# Patient Record
Sex: Female | Born: 1955 | Race: White | Hispanic: No | Marital: Married | Smoking: Current some day smoker
Health system: Southern US, Community
[De-identification: ages and names within clinical notes are randomized; demographics above are authoritative.]

## PROBLEM LIST (undated history)

## (undated) DIAGNOSIS — J45909 Unspecified asthma, uncomplicated: Secondary | ICD-10-CM

## (undated) DIAGNOSIS — I739 Peripheral vascular disease, unspecified: Secondary | ICD-10-CM

## (undated) DIAGNOSIS — G43909 Migraine, unspecified, not intractable, without status migrainosus: Secondary | ICD-10-CM

## (undated) DIAGNOSIS — E785 Hyperlipidemia, unspecified: Secondary | ICD-10-CM

## (undated) DIAGNOSIS — I10 Essential (primary) hypertension: Secondary | ICD-10-CM

## (undated) DIAGNOSIS — I2510 Atherosclerotic heart disease of native coronary artery without angina pectoris: Secondary | ICD-10-CM

## (undated) DIAGNOSIS — K589 Irritable bowel syndrome without diarrhea: Secondary | ICD-10-CM

## (undated) DIAGNOSIS — J449 Chronic obstructive pulmonary disease, unspecified: Secondary | ICD-10-CM

---

## 2000-11-23 ENCOUNTER — Encounter: Payer: Self-pay | Admitting: Surgery

## 2000-11-23 ENCOUNTER — Ambulatory Visit (HOSPITAL_COMMUNITY): Admission: RE | Admit: 2000-11-23 | Discharge: 2000-11-23 | Payer: Self-pay | Admitting: *Deleted

## 2002-10-09 ENCOUNTER — Encounter: Payer: Self-pay | Admitting: Specialist

## 2002-10-09 ENCOUNTER — Ambulatory Visit (HOSPITAL_COMMUNITY): Admission: RE | Admit: 2002-10-09 | Discharge: 2002-10-09 | Payer: Self-pay | Admitting: Specialist

## 2004-06-18 ENCOUNTER — Encounter: Admission: RE | Admit: 2004-06-18 | Discharge: 2004-06-18 | Payer: Self-pay | Admitting: Family Medicine

## 2004-06-23 ENCOUNTER — Encounter: Admission: RE | Admit: 2004-06-23 | Discharge: 2004-06-23 | Payer: Self-pay | Admitting: Family Medicine

## 2005-02-07 ENCOUNTER — Emergency Department (HOSPITAL_COMMUNITY): Admission: EM | Admit: 2005-02-07 | Discharge: 2005-02-08 | Payer: Self-pay | Admitting: Emergency Medicine

## 2005-02-11 ENCOUNTER — Ambulatory Visit: Payer: Self-pay | Admitting: Family Medicine

## 2005-02-16 ENCOUNTER — Encounter: Admission: RE | Admit: 2005-02-16 | Discharge: 2005-02-16 | Payer: Self-pay | Admitting: General Surgery

## 2005-02-17 ENCOUNTER — Ambulatory Visit: Payer: Self-pay | Admitting: Family Medicine

## 2005-02-26 ENCOUNTER — Encounter: Admission: RE | Admit: 2005-02-26 | Discharge: 2005-02-26 | Payer: Self-pay | Admitting: General Surgery

## 2005-03-11 ENCOUNTER — Ambulatory Visit: Payer: Self-pay | Admitting: Family Medicine

## 2005-03-17 ENCOUNTER — Ambulatory Visit: Payer: Self-pay | Admitting: Internal Medicine

## 2005-04-05 ENCOUNTER — Ambulatory Visit: Payer: Self-pay | Admitting: Family Medicine

## 2005-04-19 ENCOUNTER — Ambulatory Visit: Payer: Self-pay | Admitting: Family Medicine

## 2005-06-15 ENCOUNTER — Ambulatory Visit: Payer: Self-pay | Admitting: Family Medicine

## 2005-07-14 ENCOUNTER — Ambulatory Visit: Payer: Self-pay | Admitting: Family Medicine

## 2005-08-17 ENCOUNTER — Ambulatory Visit: Payer: Self-pay | Admitting: Family Medicine

## 2005-08-30 ENCOUNTER — Ambulatory Visit: Payer: Self-pay | Admitting: Family Medicine

## 2005-09-14 ENCOUNTER — Ambulatory Visit: Payer: Self-pay | Admitting: Family Medicine

## 2005-12-14 ENCOUNTER — Ambulatory Visit: Payer: Self-pay | Admitting: Gastroenterology

## 2005-12-14 ENCOUNTER — Inpatient Hospital Stay (HOSPITAL_COMMUNITY): Admission: EM | Admit: 2005-12-14 | Discharge: 2005-12-16 | Payer: Self-pay | Admitting: Family Medicine

## 2005-12-14 ENCOUNTER — Ambulatory Visit: Payer: Self-pay | Admitting: Family Medicine

## 2005-12-15 ENCOUNTER — Ambulatory Visit: Payer: Self-pay | Admitting: Internal Medicine

## 2005-12-21 ENCOUNTER — Ambulatory Visit: Payer: Self-pay | Admitting: Family Medicine

## 2005-12-24 ENCOUNTER — Ambulatory Visit: Payer: Self-pay | Admitting: Internal Medicine

## 2005-12-30 ENCOUNTER — Ambulatory Visit: Payer: Self-pay | Admitting: Family Medicine

## 2005-12-31 ENCOUNTER — Ambulatory Visit (HOSPITAL_COMMUNITY): Admission: RE | Admit: 2005-12-31 | Discharge: 2005-12-31 | Payer: Self-pay | Admitting: Internal Medicine

## 2006-01-05 ENCOUNTER — Ambulatory Visit: Payer: Self-pay | Admitting: Internal Medicine

## 2006-02-15 ENCOUNTER — Ambulatory Visit: Payer: Self-pay | Admitting: Family Medicine

## 2006-02-24 ENCOUNTER — Ambulatory Visit: Payer: Self-pay | Admitting: Internal Medicine

## 2006-03-31 ENCOUNTER — Ambulatory Visit: Payer: Self-pay | Admitting: Internal Medicine

## 2006-04-19 ENCOUNTER — Ambulatory Visit: Payer: Self-pay | Admitting: Family Medicine

## 2006-05-19 ENCOUNTER — Ambulatory Visit: Payer: Self-pay | Admitting: Family Medicine

## 2006-06-02 ENCOUNTER — Ambulatory Visit (HOSPITAL_COMMUNITY): Admission: RE | Admit: 2006-06-02 | Discharge: 2006-06-02 | Payer: Self-pay | Admitting: Internal Medicine

## 2006-06-02 ENCOUNTER — Encounter: Payer: Self-pay | Admitting: Internal Medicine

## 2006-06-02 ENCOUNTER — Observation Stay (HOSPITAL_COMMUNITY): Admission: EM | Admit: 2006-06-02 | Discharge: 2006-06-03 | Payer: Self-pay | Admitting: Emergency Medicine

## 2006-06-02 ENCOUNTER — Encounter (INDEPENDENT_AMBULATORY_CARE_PROVIDER_SITE_OTHER): Payer: Self-pay | Admitting: Specialist

## 2006-06-03 ENCOUNTER — Ambulatory Visit: Payer: Self-pay | Admitting: Family Medicine

## 2006-06-06 ENCOUNTER — Ambulatory Visit: Payer: Self-pay | Admitting: Family Medicine

## 2006-06-06 ENCOUNTER — Ambulatory Visit: Payer: Self-pay | Admitting: Internal Medicine

## 2006-06-14 ENCOUNTER — Ambulatory Visit: Payer: Self-pay | Admitting: Family Medicine

## 2006-06-27 ENCOUNTER — Ambulatory Visit: Payer: Self-pay | Admitting: Internal Medicine

## 2006-06-28 ENCOUNTER — Ambulatory Visit: Payer: Self-pay | Admitting: Family Medicine

## 2006-07-29 ENCOUNTER — Ambulatory Visit: Payer: Self-pay | Admitting: Family Medicine

## 2006-09-20 ENCOUNTER — Ambulatory Visit: Payer: Self-pay | Admitting: Family Medicine

## 2006-10-05 ENCOUNTER — Ambulatory Visit: Payer: Self-pay | Admitting: Internal Medicine

## 2007-02-07 ENCOUNTER — Ambulatory Visit: Payer: Self-pay | Admitting: Family Medicine

## 2007-02-17 ENCOUNTER — Inpatient Hospital Stay (HOSPITAL_COMMUNITY): Admission: RE | Admit: 2007-02-17 | Discharge: 2007-02-17 | Payer: Self-pay | Admitting: Orthopedic Surgery

## 2007-03-22 DIAGNOSIS — J45909 Unspecified asthma, uncomplicated: Secondary | ICD-10-CM | POA: Insufficient documentation

## 2007-03-22 DIAGNOSIS — E785 Hyperlipidemia, unspecified: Secondary | ICD-10-CM | POA: Insufficient documentation

## 2007-03-22 DIAGNOSIS — I739 Peripheral vascular disease, unspecified: Secondary | ICD-10-CM

## 2007-03-22 DIAGNOSIS — K5730 Diverticulosis of large intestine without perforation or abscess without bleeding: Secondary | ICD-10-CM | POA: Insufficient documentation

## 2007-05-05 ENCOUNTER — Ambulatory Visit: Payer: Self-pay | Admitting: Family Medicine

## 2007-05-25 ENCOUNTER — Ambulatory Visit: Payer: Self-pay | Admitting: Family Medicine

## 2007-05-25 DIAGNOSIS — I119 Hypertensive heart disease without heart failure: Secondary | ICD-10-CM | POA: Insufficient documentation

## 2007-07-17 ENCOUNTER — Ambulatory Visit: Payer: Self-pay | Admitting: Family Medicine

## 2007-07-17 DIAGNOSIS — G43909 Migraine, unspecified, not intractable, without status migrainosus: Secondary | ICD-10-CM

## 2007-07-17 DIAGNOSIS — F17200 Nicotine dependence, unspecified, uncomplicated: Secondary | ICD-10-CM

## 2007-07-25 ENCOUNTER — Telehealth: Payer: Self-pay | Admitting: Family Medicine

## 2007-07-26 ENCOUNTER — Telehealth: Payer: Self-pay | Admitting: Family Medicine

## 2007-08-02 ENCOUNTER — Ambulatory Visit: Payer: Self-pay | Admitting: Internal Medicine

## 2007-08-08 ENCOUNTER — Encounter: Admission: RE | Admit: 2007-08-08 | Discharge: 2007-08-08 | Payer: Self-pay | Admitting: Family Medicine

## 2007-08-25 ENCOUNTER — Ambulatory Visit: Payer: Self-pay | Admitting: Family Medicine

## 2007-08-25 LAB — CONVERTED CEMR LAB
ALT: 17 units/L (ref 0–35)
AST: 22 units/L (ref 0–37)
Albumin: 4.2 g/dL (ref 3.5–5.2)
Alkaline Phosphatase: 81 units/L (ref 39–117)
BUN: 4 mg/dL — ABNORMAL LOW (ref 6–23)
Basophils Absolute: 0.1 10*3/uL (ref 0.0–0.1)
Calcium: 10 mg/dL (ref 8.4–10.5)
Chloride: 106 meq/L (ref 96–112)
Creatinine, Ser: 0.8 mg/dL (ref 0.4–1.2)
HCT: 42.5 % (ref 36.0–46.0)
MCHC: 34.7 g/dL (ref 30.0–36.0)
Neutrophils Relative %: 68.9 % (ref 43.0–77.0)
Platelets: 366 10*3/uL (ref 150–400)
RBC: 4.57 M/uL (ref 3.87–5.11)
RDW: 12.3 % (ref 11.5–14.6)
Sodium: 147 meq/L — ABNORMAL HIGH (ref 135–145)
Total Bilirubin: 0.4 mg/dL (ref 0.3–1.2)
Total CHOL/HDL Ratio: 8.5
Triglycerides: 250 mg/dL (ref 0–149)

## 2007-08-29 ENCOUNTER — Ambulatory Visit: Payer: Self-pay | Admitting: Family Medicine

## 2007-09-07 ENCOUNTER — Ambulatory Visit: Payer: Self-pay | Admitting: Cardiology

## 2007-09-29 ENCOUNTER — Telehealth: Payer: Self-pay | Admitting: Family Medicine

## 2007-09-29 ENCOUNTER — Ambulatory Visit: Payer: Self-pay | Admitting: Family Medicine

## 2007-09-29 DIAGNOSIS — K589 Irritable bowel syndrome without diarrhea: Secondary | ICD-10-CM

## 2007-10-30 ENCOUNTER — Ambulatory Visit: Payer: Self-pay | Admitting: Internal Medicine

## 2007-10-30 DIAGNOSIS — R1319 Other dysphagia: Secondary | ICD-10-CM

## 2007-10-31 ENCOUNTER — Encounter: Payer: Self-pay | Admitting: Internal Medicine

## 2007-11-14 ENCOUNTER — Encounter: Payer: Self-pay | Admitting: Family Medicine

## 2007-11-14 ENCOUNTER — Ambulatory Visit: Payer: Self-pay | Admitting: Internal Medicine

## 2007-11-14 ENCOUNTER — Encounter (INDEPENDENT_AMBULATORY_CARE_PROVIDER_SITE_OTHER): Payer: Self-pay | Admitting: *Deleted

## 2007-11-14 ENCOUNTER — Encounter: Payer: Self-pay | Admitting: Internal Medicine

## 2007-11-28 DIAGNOSIS — M5137 Other intervertebral disc degeneration, lumbosacral region: Secondary | ICD-10-CM | POA: Insufficient documentation

## 2007-12-11 ENCOUNTER — Ambulatory Visit: Payer: Self-pay | Admitting: Family Medicine

## 2007-12-14 ENCOUNTER — Ambulatory Visit: Payer: Self-pay | Admitting: Family Medicine

## 2007-12-17 ENCOUNTER — Encounter: Admission: RE | Admit: 2007-12-17 | Discharge: 2007-12-17 | Payer: Self-pay | Admitting: Family Medicine

## 2007-12-18 ENCOUNTER — Telehealth (INDEPENDENT_AMBULATORY_CARE_PROVIDER_SITE_OTHER): Payer: Self-pay | Admitting: *Deleted

## 2007-12-19 ENCOUNTER — Telehealth: Payer: Self-pay | Admitting: Family Medicine

## 2008-01-23 ENCOUNTER — Ambulatory Visit: Payer: Self-pay | Admitting: Family Medicine

## 2008-04-12 ENCOUNTER — Encounter: Payer: Self-pay | Admitting: *Deleted

## 2008-07-11 ENCOUNTER — Telehealth: Payer: Self-pay | Admitting: Family Medicine

## 2008-08-20 ENCOUNTER — Ambulatory Visit: Payer: Self-pay | Admitting: Family Medicine

## 2008-08-27 ENCOUNTER — Encounter (INDEPENDENT_AMBULATORY_CARE_PROVIDER_SITE_OTHER): Payer: Self-pay | Admitting: *Deleted

## 2008-08-28 ENCOUNTER — Ambulatory Visit: Payer: Self-pay | Admitting: Internal Medicine

## 2008-08-28 DIAGNOSIS — H1851 Endothelial corneal dystrophy: Secondary | ICD-10-CM

## 2008-08-28 DIAGNOSIS — F411 Generalized anxiety disorder: Secondary | ICD-10-CM | POA: Insufficient documentation

## 2008-09-03 ENCOUNTER — Ambulatory Visit: Payer: Self-pay | Admitting: Family Medicine

## 2008-09-10 ENCOUNTER — Telehealth: Payer: Self-pay | Admitting: Internal Medicine

## 2008-09-16 ENCOUNTER — Encounter: Payer: Self-pay | Admitting: Family Medicine

## 2008-09-16 ENCOUNTER — Ambulatory Visit: Payer: Self-pay | Admitting: Family Medicine

## 2008-09-18 ENCOUNTER — Telehealth: Payer: Self-pay | Admitting: Family Medicine

## 2008-09-18 DIAGNOSIS — I781 Nevus, non-neoplastic: Secondary | ICD-10-CM

## 2008-11-05 ENCOUNTER — Telehealth: Payer: Self-pay | Admitting: Internal Medicine

## 2008-12-06 ENCOUNTER — Telehealth: Payer: Self-pay | Admitting: Family Medicine

## 2008-12-09 ENCOUNTER — Telehealth: Payer: Self-pay | Admitting: Family Medicine

## 2008-12-25 ENCOUNTER — Telehealth: Payer: Self-pay | Admitting: Internal Medicine

## 2009-02-13 ENCOUNTER — Ambulatory Visit: Payer: Self-pay | Admitting: Family Medicine

## 2009-02-13 DIAGNOSIS — M549 Dorsalgia, unspecified: Secondary | ICD-10-CM | POA: Insufficient documentation

## 2009-02-13 DIAGNOSIS — L723 Sebaceous cyst: Secondary | ICD-10-CM

## 2009-02-21 ENCOUNTER — Telehealth: Payer: Self-pay | Admitting: Family Medicine

## 2009-03-10 ENCOUNTER — Telehealth: Payer: Self-pay | Admitting: Family Medicine

## 2009-03-11 ENCOUNTER — Telehealth: Payer: Self-pay | Admitting: Internal Medicine

## 2009-03-19 ENCOUNTER — Telehealth (INDEPENDENT_AMBULATORY_CARE_PROVIDER_SITE_OTHER): Payer: Self-pay | Admitting: *Deleted

## 2009-03-20 ENCOUNTER — Telehealth: Payer: Self-pay | Admitting: Internal Medicine

## 2009-03-21 ENCOUNTER — Telehealth: Payer: Self-pay | Admitting: Family Medicine

## 2009-03-26 ENCOUNTER — Telehealth: Payer: Self-pay | Admitting: Internal Medicine

## 2009-03-26 ENCOUNTER — Telehealth: Payer: Self-pay | Admitting: Family Medicine

## 2009-04-03 ENCOUNTER — Telehealth: Payer: Self-pay | Admitting: Family Medicine

## 2009-05-01 ENCOUNTER — Telehealth: Payer: Self-pay | Admitting: Family Medicine

## 2009-05-26 ENCOUNTER — Telehealth: Payer: Self-pay | Admitting: Internal Medicine

## 2009-05-28 ENCOUNTER — Ambulatory Visit: Payer: Self-pay | Admitting: Family Medicine

## 2009-05-28 DIAGNOSIS — G609 Hereditary and idiopathic neuropathy, unspecified: Secondary | ICD-10-CM | POA: Insufficient documentation

## 2009-05-28 DIAGNOSIS — S7000XA Contusion of unspecified hip, initial encounter: Secondary | ICD-10-CM

## 2009-05-29 ENCOUNTER — Telehealth: Payer: Self-pay | Admitting: Family Medicine

## 2009-05-29 ENCOUNTER — Ambulatory Visit: Payer: Self-pay | Admitting: Family Medicine

## 2009-06-02 ENCOUNTER — Telehealth: Payer: Self-pay | Admitting: Family Medicine

## 2009-06-02 LAB — CONVERTED CEMR LAB
ALT: 12 units/L (ref 0–35)
BUN: 11 mg/dL (ref 6–23)
Bilirubin, Direct: 0 mg/dL (ref 0.0–0.3)
CO2: 29 meq/L (ref 19–32)
Chloride: 104 meq/L (ref 96–112)
Creatinine, Ser: 0.7 mg/dL (ref 0.4–1.2)
Eosinophils Absolute: 0.1 10*3/uL (ref 0.0–0.7)
Glucose, Bld: 152 mg/dL — ABNORMAL HIGH (ref 70–99)
HCT: 39.1 % (ref 36.0–46.0)
Hgb A1c MFr Bld: 6 % (ref 4.6–6.5)
Iron: 30 ug/dL — ABNORMAL LOW (ref 42–145)
Lymphs Abs: 3.2 10*3/uL (ref 0.7–4.0)
MCHC: 35.4 g/dL (ref 30.0–36.0)
MCV: 93.1 fL (ref 78.0–100.0)
Monocytes Absolute: 0.7 10*3/uL (ref 0.1–1.0)
Neutrophils Relative %: 69.6 % (ref 43.0–77.0)
Platelets: 321 10*3/uL (ref 150.0–400.0)
Potassium: 3.8 meq/L (ref 3.5–5.1)
TSH: 0.81 microintl units/mL (ref 0.35–5.50)
Total Bilirubin: 0.5 mg/dL (ref 0.3–1.2)
Total Protein: 7 g/dL (ref 6.0–8.3)
Transferrin: 195.3 mg/dL — ABNORMAL LOW (ref 212.0–360.0)

## 2009-06-09 ENCOUNTER — Telehealth: Payer: Self-pay | Admitting: Family Medicine

## 2009-06-09 ENCOUNTER — Ambulatory Visit: Payer: Self-pay | Admitting: Family Medicine

## 2009-06-09 ENCOUNTER — Telehealth: Payer: Self-pay | Admitting: Internal Medicine

## 2009-06-09 DIAGNOSIS — R3129 Other microscopic hematuria: Secondary | ICD-10-CM | POA: Insufficient documentation

## 2009-06-09 DIAGNOSIS — M545 Low back pain: Secondary | ICD-10-CM

## 2009-06-09 LAB — CONVERTED CEMR LAB
Bilirubin Urine: NEGATIVE
Protein, U semiquant: NEGATIVE
Specific Gravity, Urine: <1.005
WBC Urine, dipstick: NEGATIVE

## 2009-06-10 ENCOUNTER — Encounter: Payer: Self-pay | Admitting: Family Medicine

## 2009-06-16 ENCOUNTER — Ambulatory Visit: Payer: Self-pay | Admitting: Internal Medicine

## 2009-06-16 DIAGNOSIS — A0472 Enterocolitis due to Clostridium difficile, not specified as recurrent: Secondary | ICD-10-CM

## 2009-06-30 ENCOUNTER — Telehealth: Payer: Self-pay | Admitting: Internal Medicine

## 2009-07-17 ENCOUNTER — Telehealth: Payer: Self-pay | Admitting: Family Medicine

## 2009-09-02 ENCOUNTER — Ambulatory Visit: Payer: Self-pay | Admitting: Family Medicine

## 2009-09-02 ENCOUNTER — Telehealth: Payer: Self-pay | Admitting: Internal Medicine

## 2009-09-03 ENCOUNTER — Ambulatory Visit: Payer: Self-pay | Admitting: Internal Medicine

## 2009-09-03 DIAGNOSIS — I10 Essential (primary) hypertension: Secondary | ICD-10-CM | POA: Insufficient documentation

## 2009-09-03 DIAGNOSIS — R1084 Generalized abdominal pain: Secondary | ICD-10-CM

## 2009-09-04 ENCOUNTER — Telehealth: Payer: Self-pay | Admitting: Physician Assistant

## 2009-09-17 ENCOUNTER — Ambulatory Visit: Payer: Self-pay | Admitting: Internal Medicine

## 2009-09-17 DIAGNOSIS — R634 Abnormal weight loss: Secondary | ICD-10-CM

## 2009-09-17 DIAGNOSIS — F341 Dysthymic disorder: Secondary | ICD-10-CM

## 2009-09-18 LAB — CONVERTED CEMR LAB
ALT: 12 units/L (ref 0–35)
AST: 13 units/L (ref 0–37)
Alkaline Phosphatase: 63 units/L (ref 39–117)
Basophils Absolute: 0.1 10*3/uL (ref 0.0–0.1)
CO2: 28 meq/L (ref 19–32)
Creatinine, Ser: 0.7 mg/dL (ref 0.4–1.2)
Eosinophils Absolute: 0 10*3/uL (ref 0.0–0.7)
GFR calc non Af Amer: 93.32 mL/min (ref >60)
HCT: 42.7 % (ref 36.0–46.0)
Hemoglobin: 14.7 g/dL (ref 12.0–15.0)
Lymphs Abs: 2.8 10*3/uL (ref 0.7–4.0)
MCHC: 34.4 g/dL (ref 30.0–36.0)
MCV: 93.9 fL (ref 78.0–100.0)
Monocytes Absolute: 0.8 10*3/uL (ref 0.1–1.0)
Neutro Abs: 7.4 10*3/uL (ref 1.4–7.7)
Platelets: 342 10*3/uL (ref 150.0–400.0)
RDW: 12.1 % (ref 11.5–14.6)
Sed Rate: 14 mm/hr (ref 0–22)
Sodium: 139 meq/L (ref 135–145)
Total Bilirubin: 0.7 mg/dL (ref 0.3–1.2)
Total Protein: 7.5 g/dL (ref 6.0–8.3)

## 2009-09-22 ENCOUNTER — Emergency Department (HOSPITAL_COMMUNITY): Admission: EM | Admit: 2009-09-22 | Discharge: 2009-09-22 | Payer: Self-pay | Admitting: Emergency Medicine

## 2009-09-23 ENCOUNTER — Telehealth: Payer: Self-pay | Admitting: Family Medicine

## 2009-09-24 ENCOUNTER — Encounter: Payer: Self-pay | Admitting: Internal Medicine

## 2009-09-26 ENCOUNTER — Encounter: Payer: Self-pay | Admitting: Internal Medicine

## 2009-10-24 ENCOUNTER — Ambulatory Visit: Payer: Self-pay | Admitting: Family Medicine

## 2009-10-24 DIAGNOSIS — N63 Unspecified lump in unspecified breast: Secondary | ICD-10-CM | POA: Insufficient documentation

## 2009-10-24 LAB — CONVERTED CEMR LAB
Alkaline Phosphatase: 53 units/L (ref 39–117)
BUN: 8 mg/dL (ref 6–23)
Bilirubin, Direct: 0.1 mg/dL (ref 0.0–0.3)
Calcium: 9 mg/dL (ref 8.4–10.5)
Chloride: 100 meq/L (ref 96–112)
Cholesterol: 230 mg/dL — ABNORMAL HIGH (ref 0–200)
Creatinine, Ser: 0.6 mg/dL (ref 0.4–1.2)
Direct LDL: 173.8 mg/dL
Eosinophils Absolute: 0.1 10*3/uL (ref 0.0–0.7)
Eosinophils Relative: 1 % (ref 0.0–5.0)
Lymphocytes Relative: 30.6 % (ref 12.0–46.0)
MCHC: 33.3 g/dL (ref 30.0–36.0)
MCV: 93.4 fL (ref 78.0–100.0)
Monocytes Absolute: 0.7 10*3/uL (ref 0.1–1.0)
Neutrophils Relative %: 60 % (ref 43.0–77.0)
Platelets: 333 10*3/uL (ref 150.0–400.0)
Total Bilirubin: 0.5 mg/dL (ref 0.3–1.2)
Triglycerides: 146 mg/dL (ref 0.0–149.0)
VLDL: 29.2 mg/dL (ref 0.0–40.0)
WBC: 8.7 10*3/uL (ref 4.5–10.5)

## 2009-11-04 ENCOUNTER — Encounter: Admission: RE | Admit: 2009-11-04 | Discharge: 2009-11-04 | Payer: Self-pay | Admitting: Family Medicine

## 2009-11-11 ENCOUNTER — Ambulatory Visit: Payer: Self-pay | Admitting: Family Medicine

## 2009-11-17 ENCOUNTER — Telehealth: Payer: Self-pay | Admitting: Family Medicine

## 2009-11-17 ENCOUNTER — Encounter: Payer: Self-pay | Admitting: Critical Care Medicine

## 2009-11-17 ENCOUNTER — Encounter: Admission: RE | Admit: 2009-11-17 | Discharge: 2009-11-17 | Payer: Self-pay | Admitting: Neurosurgery

## 2009-11-18 ENCOUNTER — Telehealth: Payer: Self-pay | Admitting: Family Medicine

## 2009-11-19 ENCOUNTER — Telehealth: Payer: Self-pay | Admitting: Family Medicine

## 2009-11-27 ENCOUNTER — Ambulatory Visit: Payer: Self-pay | Admitting: Family Medicine

## 2009-11-27 ENCOUNTER — Telehealth: Payer: Self-pay | Admitting: Internal Medicine

## 2009-11-27 LAB — CONVERTED CEMR LAB
Glucose, Urine, Semiquant: NEGATIVE
Ketones, urine, test strip: NEGATIVE
Urobilinogen, UA: 0.2

## 2009-12-02 ENCOUNTER — Ambulatory Visit: Payer: Self-pay | Admitting: Family Medicine

## 2009-12-02 ENCOUNTER — Telehealth: Payer: Self-pay | Admitting: Internal Medicine

## 2009-12-04 ENCOUNTER — Ambulatory Visit: Payer: Self-pay | Admitting: Gastroenterology

## 2009-12-05 ENCOUNTER — Telehealth: Payer: Self-pay | Admitting: Nurse Practitioner

## 2009-12-08 ENCOUNTER — Telehealth: Payer: Self-pay | Admitting: Family Medicine

## 2009-12-23 ENCOUNTER — Encounter: Payer: Self-pay | Admitting: Internal Medicine

## 2009-12-25 ENCOUNTER — Ambulatory Visit: Payer: Self-pay | Admitting: Family Medicine

## 2009-12-25 DIAGNOSIS — K219 Gastro-esophageal reflux disease without esophagitis: Secondary | ICD-10-CM | POA: Insufficient documentation

## 2009-12-27 ENCOUNTER — Encounter: Payer: Self-pay | Admitting: Internal Medicine

## 2009-12-28 ENCOUNTER — Encounter: Payer: Self-pay | Admitting: Internal Medicine

## 2009-12-29 ENCOUNTER — Encounter: Payer: Self-pay | Admitting: Internal Medicine

## 2009-12-29 ENCOUNTER — Telehealth: Payer: Self-pay | Admitting: Internal Medicine

## 2010-01-05 ENCOUNTER — Ambulatory Visit: Payer: Self-pay | Admitting: Internal Medicine

## 2010-01-05 DIAGNOSIS — R933 Abnormal findings on diagnostic imaging of other parts of digestive tract: Secondary | ICD-10-CM

## 2010-01-09 ENCOUNTER — Telehealth: Payer: Self-pay | Admitting: Internal Medicine

## 2010-01-09 LAB — CONVERTED CEMR LAB
Basophils Absolute: 0 10*3/uL (ref 0.0–0.1)
Basophils Relative: 0.4 % (ref 0.0–3.0)
Eosinophils Relative: 0.5 % (ref 0.0–5.0)
Hemoglobin: 11.8 g/dL — ABNORMAL LOW (ref 12.0–15.0)
Lymphocytes Relative: 20.8 % (ref 12.0–46.0)
Monocytes Relative: 5 % (ref 3.0–12.0)
Neutro Abs: 8 10*3/uL — ABNORMAL HIGH (ref 1.4–7.7)
RBC: 3.58 M/uL — ABNORMAL LOW (ref 3.87–5.11)
RDW: 13.7 % (ref 11.5–14.6)
WBC: 10.9 10*3/uL — ABNORMAL HIGH (ref 4.5–10.5)

## 2010-01-27 ENCOUNTER — Telehealth: Payer: Self-pay | Admitting: Family Medicine

## 2010-01-27 ENCOUNTER — Ambulatory Visit: Payer: Self-pay | Admitting: Family Medicine

## 2010-02-02 ENCOUNTER — Ambulatory Visit: Payer: Self-pay | Admitting: Family Medicine

## 2010-03-06 ENCOUNTER — Telehealth: Payer: Self-pay | Admitting: Internal Medicine

## 2010-04-23 ENCOUNTER — Ambulatory Visit: Payer: Self-pay | Admitting: Internal Medicine

## 2010-04-24 ENCOUNTER — Ambulatory Visit: Payer: Self-pay | Admitting: Family Medicine

## 2010-04-24 ENCOUNTER — Telehealth: Payer: Self-pay | Admitting: Family Medicine

## 2010-04-24 LAB — CONVERTED CEMR LAB
Basophils Absolute: 0.1 10*3/uL (ref 0.0–0.1)
CO2: 28 meq/L (ref 19–32)
Calcium: 9.5 mg/dL (ref 8.4–10.5)
Creatinine, Ser: 0.8 mg/dL (ref 0.4–1.2)
Glucose, Bld: 109 mg/dL — ABNORMAL HIGH (ref 70–99)
HCT: 45.2 % (ref 36.0–46.0)
Hemoglobin: 15.6 g/dL — ABNORMAL HIGH (ref 12.0–15.0)
Lipase: 20 units/L (ref 11.0–59.0)
Lymphs Abs: 3.1 10*3/uL (ref 0.7–4.0)
Monocytes Relative: 5.4 % (ref 3.0–12.0)
Neutro Abs: 7.7 10*3/uL (ref 1.4–7.7)
RDW: 13.3 % (ref 11.5–14.6)

## 2010-04-28 ENCOUNTER — Emergency Department (HOSPITAL_COMMUNITY): Admission: EM | Admit: 2010-04-28 | Discharge: 2010-04-28 | Payer: Self-pay | Admitting: Emergency Medicine

## 2010-04-28 ENCOUNTER — Encounter: Payer: Self-pay | Admitting: Internal Medicine

## 2010-04-28 ENCOUNTER — Telehealth: Payer: Self-pay | Admitting: Internal Medicine

## 2010-04-29 ENCOUNTER — Emergency Department (HOSPITAL_COMMUNITY): Admission: EM | Admit: 2010-04-29 | Discharge: 2010-04-29 | Payer: Self-pay | Admitting: Emergency Medicine

## 2010-04-29 ENCOUNTER — Telehealth (INDEPENDENT_AMBULATORY_CARE_PROVIDER_SITE_OTHER): Payer: Self-pay

## 2010-04-30 ENCOUNTER — Ambulatory Visit: Payer: Self-pay | Admitting: Family Medicine

## 2010-04-30 DIAGNOSIS — N3000 Acute cystitis without hematuria: Secondary | ICD-10-CM | POA: Insufficient documentation

## 2010-04-30 LAB — CONVERTED CEMR LAB
Bilirubin Urine: NEGATIVE
Ketones, urine, test strip: NEGATIVE
Nitrite: NEGATIVE
Protein, U semiquant: NEGATIVE
Urobilinogen, UA: 0.2

## 2010-05-15 ENCOUNTER — Telehealth: Payer: Self-pay | Admitting: Family Medicine

## 2010-05-22 ENCOUNTER — Ambulatory Visit: Payer: Self-pay | Admitting: Family Medicine

## 2010-05-22 LAB — CONVERTED CEMR LAB
Glucose, Urine, Semiquant: NEGATIVE
Urobilinogen, UA: 0.2
WBC Urine, dipstick: NEGATIVE
pH: 7

## 2010-06-03 ENCOUNTER — Encounter: Payer: Self-pay | Admitting: Internal Medicine

## 2010-06-08 ENCOUNTER — Telehealth: Payer: Self-pay | Admitting: Internal Medicine

## 2010-06-30 ENCOUNTER — Telehealth (INDEPENDENT_AMBULATORY_CARE_PROVIDER_SITE_OTHER): Payer: Self-pay | Admitting: *Deleted

## 2010-07-10 ENCOUNTER — Encounter: Payer: Self-pay | Admitting: Family Medicine

## 2010-07-10 ENCOUNTER — Ambulatory Visit: Payer: Self-pay | Admitting: Family Medicine

## 2010-07-15 ENCOUNTER — Encounter: Payer: Self-pay | Admitting: Family Medicine

## 2010-08-11 ENCOUNTER — Encounter: Payer: Self-pay | Admitting: Family Medicine

## 2010-08-12 ENCOUNTER — Telehealth (INDEPENDENT_AMBULATORY_CARE_PROVIDER_SITE_OTHER): Payer: Self-pay | Admitting: *Deleted

## 2010-10-08 ENCOUNTER — Telehealth (INDEPENDENT_AMBULATORY_CARE_PROVIDER_SITE_OTHER): Payer: Self-pay | Admitting: *Deleted

## 2010-10-29 ENCOUNTER — Ambulatory Visit: Admit: 2010-10-29 | Payer: Self-pay | Admitting: Family Medicine

## 2010-11-15 LAB — CONVERTED CEMR LAB
AST: 19 units/L (ref 0–37)
Albumin: 4.1 g/dL (ref 3.5–5.2)
Alkaline Phosphatase: 73 units/L (ref 39–117)
BUN: 8 mg/dL (ref 6–23)
Basophils Absolute: 0.1 10*3/uL (ref 0.0–0.1)
Bilirubin Urine: NEGATIVE
Chloride: 105 meq/L (ref 96–112)
Cholesterol: 278 mg/dL (ref 0–200)
Creatinine, Ser: 0.8 mg/dL (ref 0.4–1.2)
Glucose, Urine, Semiquant: NEGATIVE
Ketones, urine, test strip: NEGATIVE
MCHC: 35 g/dL (ref 30.0–36.0)
Monocytes Relative: 4.6 % (ref 3.0–11.0)
Nitrite: NEGATIVE
Protein, U semiquant: NEGATIVE
RBC: 4.58 M/uL (ref 3.87–5.11)
RDW: 12.4 % (ref 11.5–14.6)
Specific Gravity, Urine: 1.015
TSH: 1.31 microintl units/mL (ref 0.35–5.50)
Total Bilirubin: 0.7 mg/dL (ref 0.3–1.2)
Total CHOL/HDL Ratio: 6.5
Triglycerides: 196 mg/dL — ABNORMAL HIGH (ref 0–149)
Urobilinogen, UA: 0.2
WBC Urine, dipstick: NEGATIVE
pH: 6.5

## 2010-11-17 NOTE — Progress Notes (Signed)
Summary: pt has some questions re: Neurosurgery  Phone Note Call from Patient Call back at [phone] cell   Caller: Patient Summary of Call: Pt has some questions re: Neurosurgery.    Initial call taken by: Lucy Antigua,  April 24, 2010 2:58 PM  Follow-up for Phone Call        Noble............ please call Follow-up by: Roderick Pee MD,  April 24, 2010 4:33 PM  Additional Follow-up for Phone Call Additional follow up Details #1::        Phone Call Completed Additional Follow-up by: Kern Reap CMA Duncan Dull),  April 24, 2010 5:12 PM

## 2010-11-17 NOTE — Progress Notes (Signed)
Summary: Pt called req refill of Oxycodone.    Phone Note Refill Request Message from:  Patient on May 15, 2010 8:27 AM  Refills Requested: Medication #1:  OXYCODONE HCL 5 MG TABS 1 by mouth every 4 hrs pain Pt called and said that the lid of her bottle of   Method Requested: Pick up at Office Initial call taken by: Lucy Antigua,  May 15, 2010 8:30 AM  Follow-up for Phone Call        Fleet Contras please call investigate the issue..........if patient requires refills, then, we need to send her to have an evaluation.neuro- surgery or a pain clinic Follow-up by: Roderick Pee MD,  May 18, 2010 8:12 AM  Additional Follow-up for Phone Call Additional follow up Details #1::        patient states she is still dealingwith the  bladder infection that has been going on for 3 weeks.  The ATB causes GI upset.  She did talk to a pain clinic and she has to be free of infection before they will give any injections.  she goes to her urologist next week.  she is taking 2-3 pills a day.  she is willling to see Dr Shon Baton with [facility]. Additional Follow-up by: Kern Reap CMA Duncan Dull),  May 18, 2010 5:26 PM    Additional Follow-up for Phone Call Additional follow up Details #2::    her neurosurgeon soon as they report, stating she's taking excessive amounts of oxycodone.  She and her husband need to come in for an office visit to discuss this Follow-up by: Roderick Pee MD,  May 18, 2010 5:49 PM  Additional Follow-up for Phone Call Additional follow up Details #3:: Details for Additional Follow-up Action Taken: left message on machine for patient to schedule an office visit Additional Follow-up by: Kern Reap CMA Duncan Dull),  May 19, 2010 9:48 AM

## 2010-11-17 NOTE — Consult Note (Signed)
Summary: [facility]   Imported By: Lester Alturas 06/12/2010 09:45:43  _____________________________________________________________________  External Attachment:    Type:   Image     Comment:   External Document

## 2010-11-17 NOTE — Assessment & Plan Note (Signed)
Summary: ? c-diff/sheri   History of Present Illness Visit Type: Follow-up Visit Primary GI MD: Stan Head MD [facility] Primary Provider: Fredia Sorrow, MD Requesting Provider: Fredia Sorrow, MD Chief Complaint: Patient has been on Cipro for a couple of days now and she states that she feels like she is going to C diff. Patient denies any diarrhea. She was states that Dr. Tawanna Cooler advised her to restart her Cipro today for her bladder infection.  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 She gives history of antibiotic induced colitis for which she was hospitalized in [place] last June. Doesn't know if she had C-Diff at the time but was on Vancomycin.  Patient saw PCP a few days ago with dysuria and abnormal U/A. Took Cipro for a few days but then became nauseated. Has not had any diarrhea but felt as though she may. Stopped Cipro but to resume it at lower dose. Cannot take Florastor or Align, they cause abdominal pain and passage of mucus. Eating GreekYogurt instead.   Her stools have actually been formed but, because of her chronic back pain, she has hard time expelling the stool. Metamucil and high fiber diet certainly help the process. Obviously not needing Lomotil these days.  She is suppose to see Andee Poles (psychiatrist) in late April. She already sees a therapist. Realizes that her emotions affect gut but feels she is emotionally well.   GI Review of Systems      Denies abdominal pain, acid reflux, belching, bloating, chest pain, dysphagia with liquids, dysphagia with solids, heartburn, loss of appetite, nausea, vomiting, vomiting blood, weight loss, and  weight gain.        Denies anal fissure, black tarry stools, change in bowel habit, constipation, diarrhea, diverticulosis, fecal incontinence, heme positive stool, hemorrhoids, irritable bowel syndrome, jaundice, light color stool, liver problems, rectal bleeding, and  rectal pain.    Current Medications (verified): 1)  Lisinopril 20 Mg  Tabs (Lisinopril) .Marland Kitchen.. 1 Tab Once Daily 2)  Tenoretic 100 100-25 Mg  Tabs (Atenolol-Chlorthalidone) .... 1/4 Tab Qam 3)  Flexeril 10 Mg Tabs (Cyclobenzaprine Hcl) .... Take 1 Tablet By Mouth Two Times A Day 4)  Promethazine Hcl 25 Mg  Tabs (Promethazine Hcl) .Marland Kitchen.. 1 Tab As Needed Every 8 Hours For Miagrianes And/or Nausea 5)  Diphenoxylate-Atropine 2.5-0.025 Mg  Tabs (Diphenoxylate-Atropine) .Marland Kitchen.. 1-2 Q 6 Hrs Prn 6)  Qvar 40 Mcg/act  Aers (Beclomethasone Dipropionate) .... 2 Puffs Two Times A Day As Needed 7)  Ventolin Hfa 108 (90 Base) Mcg/act  Aers (Albuterol Sulfate) .... 2 Puffs 4x Day As Needed 8)  Epipen 2-Pak 0.3 Mg/0.16ml (1:1000)  Devi (Epinephrine Hcl (Anaphylaxis)) .... Prn 9)  Flonase 50 Mcg/act  Susp (Fluticasone Propionate) .... Use As Directed 10)  Phenadoz 25 Mg Supp (Promethazine Hcl) .Marland Kitchen.. 1 Rectally Three Times A Day As Needed 11)  Lorazepam 1 Mg  Tabs (Lorazepam) .Marland Kitchen.. 1-2 Every 8 Hrs As Needed For Anxiety 12)  Librax 2.5-5 Mg Caps (Clidinium-Chlordiazepoxide) .... Take 2 Tabs Before Each Meal and 1 At Bedtime 13)  Vicodin Es 7.5-750 Mg Tabs (Hydrocodone-Acetaminophen) .... Take 1 Tablet By Mouth Three Times A Day 14)  Klor-Con M20 20 Meq Cr-Tabs (Potassium Chloride Crys Cr) .... Take One Tab Once Daily 15)  Ciprofloxacin Hcl 500 Mg Tabs (Ciprofloxacin Hcl) .... Take 1 Tablet By Mouth Two Times A Day X 7 Days, Then 1 At Bedtime X 3 Weeks (Holding) 16)  Sammi (Herbal Antidepressant) .... Take  One By Mouth Once Daily  Allergies (verified): 1)  ! * Antiinflammitories 2)  ! * Statins 3)  ! Prednisone (Prednisone) 4)  ! Bactrim (Sulfamethoxazole-Trimethoprim) 5)  Propofol (Propofol) 6)  Tylenol (Acetaminophen) 7)  Aspirin (Aspirin) 8)  * Barium  Past History:  Family History: Last updated: 06/16/2009 Family History Depression No FH of Colon Cancer:  Social History: Last updated: 10/30/2007 Patient currently  smokes.  Patient has been counseled to quit.  Alcohol Use - yes rare Married  Past Medical History: MIGAINE HEADACHES TOBACCO ABUSE Asthma Hyperlipidemia Peripheral vascular disease Diverticulosis, colon IBS-diarrhea Prior C. diff colitis Anxiety/Depression Lactose Intolerance Hypertension Costochondritis and chest wall pain Spinal stenosis Fuch's Corneal Dystrophy  Past Surgical History: TAH-BSO Right ganglion cyst  Review of Systems       The patient complains of allergy/sinus, anxiety-new, arthritis/joint pain, back pain, change in vision, cough, fatigue, fever, headaches-new, muscle pains/cramps, sleeping problems, swelling of feet/legs, thirst - excessive, and urination - excessive.  The patient denies anemia, blood in urine, breast changes/lumps, confusion, coughing up blood, depression-new, fainting, hearing problems, heart murmur, heart rhythm changes, itching, menstrual pain, night sweats, nosebleeds, pregnancy symptoms, shortness of breath, skin rash, sore throat, swollen lymph glands, thirst - excessive , urination - excessive , urination changes/pain, urine leakage, vision changes, and voice change.    Vital Signs:  Patient profile:   55 year old female Height:      63.5 inches Weight:      138.6 pounds BMI:     24.25 Pulse rate:   80 / minute Pulse rhythm:   regular BP sitting:   140 / 68  (left arm) Cuff size:   regular  Vitals Entered By: Harlow Mares CMA Duncan Dull) (December 04, 2009 8:45 AM)  Physical Exam  General:  Well developed, well nourished, no acute distress. Neck:  no obvious masses  Lungs:  Clear throughout to auscultation. Heart:  RRR Abdomen:  Abdomen soft,  nondistended. Mild-moderate LLQ tenderness. Mild epigastric tenderness.No obvious masses or hepatomegaly.Normal bowel sounds.  Extremities:  No clubbing, cyanosis, edema or deformities noted. Neurologic:  Alert and  oriented x4;  grossly normal neurologically. Psych:  Alert and  cooperative. Normal mood and affect.  Impression & Recommendations:  Problem # 1:  IRRITABLE BOWEL SYNDROME (ICD-564.1) Curently BMs are normal though has a difficult time expelling stool secondary to chronic back pain. Takes daily Metamucil which she should continue.   Problem # 2:  DIARRHEA, ANTIBIOTIC ASSOCIATED (ICD-008.45) Assessment: Comment Only Hx. of antibiotic associated coliits in June in [place]. ?C-Diff.  She will be taking Cipro for UTI and is nervous about getting colitis. I explained that antibiotics are sometimes necessary and if she develops diarrhea then we will check for C-Diff and manage accordingly. Florastor and Align cause abdominal pain / passage of mucus.   Problem # 3:  ANXIETY DEPRESSION (ICD-300.4) Assessment: Comment Only On treatment. To see Dr. Andee Poles late April.  Problem # 4:  BACK PAIN, LUMBAR (ICD-724.2) Assessment: Comment Only Abnormal myelogram. She may need surgery in the future.  Patient Instructions: 1)  When restarting the Cipro, if you get Diarrhea, please call us.  2)  The medication list was reviewed and reconciled.  All changed / newly prescribed medications were explained.  A complete medication list was provided to the patient / caregiver.

## 2010-11-17 NOTE — Assessment & Plan Note (Signed)
Summary: head and chest congestion/cjr   Vital Signs:  Patient profile:   55 year old female Weight:      135 pounds Temp:     98.5 degrees F oral BP sitting:   130 / 90  (left arm) Cuff size:   regular  Vitals Entered By: Kern Reap CMA Duncan Dull) (July 10, 2010 11:30 AM) CC: head congestion   Primary Care Provider:  Kelle Darting, MD  CC:  head congestion.  History of Present Illness: Julliette is a 55 year old, married female, smoker........Marland Kitchen 10 cigarettes a day...Marland KitchenMarland KitchenMarland Kitchen who declines a smoking cessation program........ who comes in today for a 3 week history of head congestion, postnasal drip, and cough.  She states she feels like she is wheezing again.  She has had a history of asthma in the past and is not taking her Qvar nor Ventolin.  She states her daughter is in the hospital, premature labor, and she cannot start a smoking cessation program now.  However, per usual, it's always something.  I explained to her the medication probably will not help it.  She continues to smoke.  She declines the oral steroids.  She recently went to [facility] to the IBS clinic and saw Dr. Pamala Hurry who stopped all her medications and has given her Levsin .125 sublingual p.r.n.Marland Kitchen  She states being off the Librax, lorazepam, it saturated she feels much better.  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 * Antiinflammitories 2)  ! * Statins 3)  ! Prednisone (Prednisone) 4)  ! Bactrim (Sulfamethoxazole-Trimethoprim) 5)  Propofol (Propofol) 6)  Tylenol (Acetaminophen) 7)  Aspirin (Aspirin) 8)  * Barium  Past History:  Past medical, surgical, family and social histories (including risk factors) reviewed for relevance to current acute and chronic problems.  Past Medical History: Reviewed history from 01/05/2010 and no changes required. MIGAINE HEADACHES TOBACCO ABUSE Asthma Hyperlipidemia Peripheral vascular disease Diverticulosis,  colon IBS-diarrhea Prior C. 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 TAH-BSO Right ganglion cyst  Family History: Reviewed history from 06/16/2009 and no changes required. Family History Depression No FH of Colon Cancer:  Social History: Reviewed history from 01/05/2010 and no changes required. Patient currently smokes.  Patient has been counseled to quit.  Alcohol Use - yes rare Married Unemployed   Review of Systems      See HPI  Physical Exam  General:  Well-developed,well-nourished,in no acute distress; alert,appropriate and cooperative throughout examination Head:  Normocephalic and atraumatic without obvious abnormalities. No apparent alopecia or balding. Eyes:  No corneal or conjunctival inflammation noted. EOMI. Perrla. Funduscopic exam benign, without hemorrhages, exudates or papilledema. Vision grossly normal. Ears:  External ear exam shows no significant lesions or deformities.  Otoscopic examination reveals clear canals, tympanic membranes are intact bilaterally without bulging, retraction, inflammation or discharge. Hearing is grossly normal bilaterally. Nose:  External nasal examination shows no deformity or inflammation. Nasal mucosa are pink and moist without lesions or exudates. Mouth:  Oral mucosa and oropharynx without lesions or exudates.  Teeth in good repair. Neck:  No deformities, masses, or tenderness noted. Chest Wall:  No deformities, masses, or tenderness noted. Lungs:  decreased but symmetrical.  Breath sounds bilateral wheezing   Impression & Recommendations:  Problem # 1:  TOBACCO ABUSE (ICD-305.1) Assessment Unchanged  Orders: Tobacco  use cessation intermediate 3-10 minutes (99406)  Problem # 2:  ASTHMA  (ICD-493.90) Assessment: Deteriorated  Her updated medication list for this problem includes:    Qvar 40 Mcg/act Aers (Beclomethasone dipropionate) .Marland Kitchen... 2 puffs two times a day as needed    Ventolin Hfa 108 (90 Base) Mcg/act Aers (Albuterol sulfate) .Marland Kitchen... 2 puffs 4x day as needed  Orders: Tobacco use cessation intermediate 3-10 minutes (99406)  Complete Medication List: 1)  Lisinopril 20 Mg Tabs (Lisinopril) .Marland Kitchen.. 1 tab once daily 2)  Tenoretic 100 100-25 Mg Tabs (Atenolol-chlorthalidone) .... 1/4 tab qam 3)  Qvar 40 Mcg/act Aers (Beclomethasone dipropionate) .... 2 puffs two times a day as needed 4)  Ventolin Hfa 108 (90 Base) Mcg/act Aers (Albuterol sulfate) .... 2 puffs 4x day as needed 5)  Epipen 2-pak 0.3 Mg/0.4ml (1:1000) Devi (Epinephrine hcl (anaphylaxis)) .... Prn 6)  Flonase 50 Mcg/act Susp (Fluticasone propionate) .... Use as directed 7)  Promethazine Hcl 25 Mg Tabs (Promethazine hcl) .... Take 1 tablet by mouth three times a day as needed 8)  Levsin/sl 0.125 Mg Subl (Hyoscyamine sulfate) .... One tab two times a day as needed 9)  Vicodin Es 7.5-750 Mg Tabs (Hydrocodone-acetaminophen) .... Take 1 tablet by mouth three times a day as needed migraine  Patient Instructions: 1)  Qvar two puffs twice daily, Ventolin one puff 3 times a day stop smoking completely.  Return in two weeks for a 30 minute appointment for general medical exam Prescriptions: FLONASE 50 MCG/ACT  SUSP (FLUTICASONE PROPIONATE) use as directed  #16 Gram x 3   Entered and Authorized by:   Roderick Pee MD   Signed by:   Roderick Pee MD on 07/10/2010   Method used:   Electronically to        [facility]  [address] (retail)       [address]       [place], [place]  [address]       Ph: [phone] or [phone]       Fax: [phone]   RxID:   [id number] VENTOLIN HFA 108 (90 BASE) MCG/ACT  AERS (ALBUTEROL SULFATE) 2 puffs 4x day as needed  #18 Gram x 2   Entered and Authorized  by:   Roderick Pee MD   Signed by:   Roderick Pee MD on 07/10/2010   Method used:   Electronically to        [facility]  [address] (retail)       [address]       [place], [place]  [address]       Ph: [phone] or [phone]       Fax: [phone]   RxID:   [id number] QVAR 40 MCG/ACT  AERS (BECLOMETHASONE DIPROPIONATE) 2 puffs two times a day as needed  #3 x 4   Entered and Authorized by:   Roderick Pee MD   Signed by:   Roderick Pee MD on 07/10/2010   Method used:   Electronically to        [facility]  [address] (retail)       [address]       [place], [place]  [address]       Ph: [phone] or [phone]       Fax: [phone]   RxID:  [id number] VICODIN ES 7.5-750 MG TABS (HYDROCODONE-ACETAMINOPHEN) Take 1 tablet by mouth three times a day as needed migraine  #30 x 1   Entered and Authorized by:   Roderick Pee MD   Signed by:   Roderick Pee MD on 07/10/2010   Method used:   Print then Give to Patient   RxID:   [id number]   Appended Document: head and chest congestion/cjr     Allergies: 1)  ! * Antiinflammitories 2)  ! * Statins 3)  ! Prednisone (Prednisone) 4)  ! Bactrim (Sulfamethoxazole-Trimethoprim) 5)  Propofol (Propofol) 6)  Tylenol (Acetaminophen) 7)  Aspirin (Aspirin) 8)  * Barium   Complete Medication List: 1)  Lisinopril 20 Mg Tabs (Lisinopril) .Marland Kitchen.. 1 tab once daily 2)  Tenoretic 100 100-25 Mg Tabs (Atenolol-chlorthalidone) .... 1/4 tab qam 3)  Qvar 40 Mcg/act Aers (Beclomethasone dipropionate) .... 2 puffs two times a day as needed 4)  Ventolin Hfa 108 (90 Base) Mcg/act Aers (Albuterol sulfate) .... 2 puffs 4x day as needed 5)  Epipen 2-pak 0.3 Mg/0.36ml (1:1000) Devi (Epinephrine hcl (anaphylaxis)) .... Prn 6)  Flonase 50 Mcg/act Susp (Fluticasone propionate) .... Use as directed 7)  Promethazine Hcl 25 Mg Tabs (Promethazine hcl) .... Take 1 tablet by mouth three times a day  as needed 8)  Levsin/sl 0.125 Mg Subl (Hyoscyamine sulfate) .... One tab two times a day as needed 9)  Vicodin Es 7.5-750 Mg Tabs (Hydrocodone-acetaminophen) .... Take 1 tablet by mouth three times a day as needed migraine  Other Orders: Spirometry w/Graph (94010)

## 2010-11-17 NOTE — Progress Notes (Signed)
Summary: triage   Phone Note From Other Clinic Call back at [phone]   Caller: nurse Fleet Contras from Dr Nelida Meuse office Call For: [name] Reason for Call: Schedule Patient Appt Summary of Call: Dr Tawanna Cooler would like this patient seen today by anyone do to C-Dif flare up Initial call taken by: Tawni Levy,  December 02, 2009 12:25 PM  Follow-up for Phone Call        patient will come see Willette Cluster RNP 12-04-09 8:30.  I have asked Dr Nelida Meuse office to have the patient call if anything changes between now and Thursday. Follow-up by: Darcey Nora RN, CGRN,  December 02, 2009 2:37 PM     Appended Document: Orders Update     Clinical Lists Changes  Orders: Added new Referral order of Gastroenterology Referral (GI) - Signed

## 2010-11-17 NOTE — Assessment & Plan Note (Signed)
Summary: discuss medications/cjr   Vital Signs:  Patient profile:   55 year old female BP sitting:   160 / 98  (left arm) Cuff size:   regular  Primary Care Provider:  Kelle Darting, MD   History of Present Illness: [name] is a 55 year old, married female, smoker, who comes in today for follow-up of an emergency room visit.  Approximate 2 a.m. on Sunday morning.  This past weekend.  She awoke with severe vomiting.  It went on for 36+ hours.  She finally went to emergency room IV fluids were given.  Lab studies were normal except for some white cells, and bacteria in her urine.  She was given IV antibiotics.  The next day.  She went back to the emergency room.  Repeat urine was normal, but she was given 10 days of Septra.  Now she feels otherwise well.  Reviewing her medical records looks all normal including a CT scan  Allergies: 1)  ! * Antiinflammitories 2)  ! * Statins 3)  ! Prednisone (Prednisone) 4)  ! Bactrim (Sulfamethoxazole-Trimethoprim) 5)  Propofol (Propofol) 6)  Tylenol (Acetaminophen) 7)  Aspirin (Aspirin) 8)  * Barium  Past History:  Past medical, surgical, family and social histories (including risk factors) reviewed for relevance to current acute and chronic problems.  Past Medical History: Reviewed history from 01/05/2010 and no changes required. MIGAINE HEADACHES TOBACCO ABUSE Asthma Hyperlipidemia Peripheral vascular disease Diverticulosis, colon IBS-diarrhea Prior C. 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 TAH-BSO Right ganglion cyst  Family History: Reviewed history from 06/16/2009 and no changes required. Family History Depression No FH of Colon Cancer:  Social History: Reviewed history from 01/05/2010 and no changes  required. Patient currently smokes.  Patient has been counseled to quit.  Alcohol Use - yes rare Married Unemployed   Review of Systems      See HPI  Physical Exam  General:  Well-developed,well-nourished,in no acute distress; alert,appropriate and cooperative throughout examination Abdomen:  Bowel sounds positive,abdomen soft and non-tender without masses, organomegaly or hernias noted.   Problems:  Medical Problems Added: 1)  Dx of Acute Cystitis  (ICD-595.0) 2)  Dx of Vomiting  (ICD-787.03)  Impression & Recommendations:  Problem # 1:  VOMITING (ICD-787.03) Assessment New  Orders: Prescription Created Electronically [id number])  Complete Medication List: 1)  Lisinopril 20 Mg Tabs (Lisinopril) .Marland Kitchen.. 1 tab once daily 2)  Tenoretic 100 100-25 Mg Tabs (Atenolol-chlorthalidone) .... 1/4 tab qam 3)  Qvar 40 Mcg/act Aers (Beclomethasone dipropionate) .... 2 puffs two times a day as needed 4)  Ventolin Hfa 108 (90 Base) Mcg/act Aers (Albuterol sulfate) .... 2 puffs 4x day as needed 5)  Epipen 2-pak 0.3 Mg/0.66ml (1:1000) Devi (Epinephrine hcl (anaphylaxis)) .... Prn 6)  Flonase 50 Mcg/act Susp (Fluticasone propionate) .... Use as directed 7)  Librax 2.5-5 Mg Caps (Clidinium-chlordiazepoxide) .... Take 2 tabs before each meal and 1 at bedtime 8)  Sammi (herbal Antidepressant)  .... Take one by mouth once daily 9)  Oxycodone Hcl 5 Mg Tabs (Oxycodone hcl) .Marland Kitchen.. 1 by mouth every 4 hrs pain 10)  Promethazine Hcl 25 Mg Tabs (Promethazine hcl) .... Take 1 tablet by mouth three times a day as needed 11)  Ambien 10 Mg Tabs (Zolpidem tartrate) .Marland Kitchen.. 1 tab @ bedtime as needed 12)  Lorazepam 1 Mg Tabs (Lorazepam) .... Take 1-2 every  8 hours as needed for anxietymust keep appt on july 7 for more refills 13)  Diphenoxylate-atropine 2.5-0.025 Mg Tabs (Diphenoxylate-atropine) .Marland Kitchen.. 1-2 tablets by mouth every 6 hours as needed 14)  Promethazine Hcl 25 Mg Supp (Promethazine hcl) .Marland Kitchen.. 1 per rectum as  needed severe nausea/vomiting every 8-12 hours 15)  Effer-k 25 Meq Tbef (Potassium bicarbonate) .... Take one tab by mouth once daily 16)  Septra Ds 800-160 Mg Tabs (Sulfamethoxazole-trimethoprim) .... Take 1 tablet by mouth two times a day x 1 week, then 1 at bedtime x 2 weeks  Other Orders: UA Dipstick w/o Micro (manual) (04540)  Patient Instructions: 1)  Septra DS one twice a day for one week, then one at bedtime for two weeks Prescriptions: SEPTRA DS 800-160 MG TABS (SULFAMETHOXAZOLE-TRIMETHOPRIM) Take 1 tablet by mouth two times a day x 1 week, then 1 at bedtime x 2 weeks  #30 x 0   Entered and Authorized by:   Roderick Pee MD   Signed by:   Roderick Pee MD on 04/30/2010   Method used:   Electronically to        [facility]  [address] (retail)       [address]       [place], [place]  [address]       Ph: [phone] or [phone]       Fax: [phone]   RxID:   [id number]   Laboratory Results   Urine Tests  Date/Time Received: April 30, 2010   Routine Urinalysis   Color: yellow Appearance: Clear Glucose: negative   (Normal Range: Negative) Bilirubin: negative   (Normal Range: Negative) Ketone: negative   (Normal Range: Negative) Spec. Gravity: 1.005   (Normal Range: 1.003-1.035) Blood: moderate   (Normal Range: Negative) pH: 6.5   (Normal Range: 5.0-8.0) Protein: negative   (Normal Range: Negative) Urobilinogen: 0.2   (Normal Range: 0-1) Nitrite: negative   (Normal Range: Negative) Leukocyte Esterace: trace   (Normal Range: Negative)    Comments: Kern Reap CMA Duncan Dull)  April 30, 2010 5:09 PM
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
## 2010-11-17 NOTE — Progress Notes (Signed)
Summary: REQ FOR MED  Phone Note Call from Patient   Caller: Patient [phone] Reason for Call: Talk to Nurse, Talk to Doctor Summary of Call: Pt called to adv that she just had myelogram procedure done by Dr Gerlene Fee (Neurosurgery) and she is currently exp      Pt adv that she was given a shot of demerol prior to procedure but it has worn off and the vicodin that she has from a prior RX doesn't work therefore, pt would like an RX for something stronger sent into [facility] on [address] in [place].Marland KitchenMarland KitchenMarland KitchenPt was adv that she may need to come in for an OV, pt didn't want to come in for OV, stated that she lives an hour away and is in pain.... Pt would like to see if Dr Tawanna Cooler would prescribe her something for pain...Marland KitchenMarland KitchenMarland Kitchen Pt has f/u appt w/ Dr Gerlene Fee on Wednesday, 11/19/2009 per Secretary at [facility] Neurosurgery.  Initial call taken by: Debbra Riding,  November 17, 2009 10:31 AM  Follow-up for Phone Call        if she is having severe pain, uncontrolled with p.o. medications that I gave her then she needs to call Dr. Gerlene Fee her neurosurgeon Follow-up by: Roderick Pee MD,  November 17, 2009 1:14 PM  Additional Follow-up for Phone Call Additional follow up Details #1::        Phone Call Completed-----Called pt at [phone]... spoke w/ pt and adv Dr Nelida Meuse instructions. Additional Follow-up by: Debbra Riding,  November 17, 2009 3:48 PM

## 2010-11-17 NOTE — Progress Notes (Signed)
Summary: triage   Phone Note Call from Patient Call back at Home Phone [phone]   Caller: Patient Call For: Leone Payor Reason for Call: Talk to Nurse Summary of Call: Patient states that she cannot keep anything down since Friday night, wants to know is she should go to the hosp. Initial call taken by: Tawni Levy,  April 28, 2010 10:39 AM  Follow-up for Phone Call        Left message for patient to call back Darcey Nora RN, [facility]  April 28, 2010 12:27 PM  I looked up the patient in e-chart she is in the ER.  She had a abdominal US and lab so far.  We will wait for her to call back based on ER MD recommendations. Follow-up by: Darcey Nora RN, CGRN,  April 28, 2010 3:05 PM
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
## 2010-11-17 NOTE — Progress Notes (Signed)
Summary: triage   Phone Note Call from Patient Call back at [phone]  (cell)   Caller: Patient Call For: Dr. Leone Payor Reason for Call: Talk to Nurse Summary of Call: pt was self-admitted to [facility] ER for constant vomiting... was advised to f/u with Dr. Leone Payor... pt doesnt want to wait until next available Initial call taken by: Vallarie Mare,  December 29, 2009 3:25 PM  Follow-up for Phone Call        Patient  was admitted to [facility] for recurrent vomiting.  Patient  had an EGD with bx.  patient is scheduled for REV with Dr Leone Payor for 01-05-10 2:15.  I have asked her to bring all copies of her records from the ER visit and the hospital stay with her to the appointment with Dr Leone Payor. Follow-up by: Darcey Nora RN, CGRN,  December 29, 2009 3:41 PM
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
## 2010-11-17 NOTE — Letter (Signed)
Summary: Discharge Letter  [place] [facility]  [address] [place], [place] [address]   Phone: [phone]  Fax: [phone]       08/11/2010 MRN: [id number]  [facility] [address] [place], [place]  [address]  Dear Ms. Cherre Robins,   I find it necessary to inform you that I will not be able to provide medical care to you due to an unproductive physician-patient relationship.  Since your condition requires medical attention, I suggest that you place your self under the care of another physician without delay. If you desire, I will be available for emergency care for 30 days after you receive this letter.  This should give you ample time to select a physician of your choice from the many competent providers in this area. You may want to call the local medical society or [facility]'s physician referral service [phone]) for their assistance in locating a new physician. With your written authorization, I will make a copy of your medical record available to your new physician.   Sincerely,      Eugenio Hoes. Tawanna Cooler, MD   Appended Document: Discharge Letter IDX and EMR updated to reflect dismissal from the practice. Letter sent out by Certified Mail.
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
## 2010-11-17 NOTE — Progress Notes (Signed)
Summary: Pt has uti and wants to bring urine sample to our office  Phone Note Call from Patient Call back at [phone] cell   Caller: Patient Summary of Call: Pt has a uti and would like to bring a urine sample by our office.  Initial call taken by: Lucy Antigua,  November 18, 2009 4:04 PM  Follow-up for Phone Call        patient is coming in on Thursday Follow-up by: Kern Reap CMA Duncan Dull),  November 18, 2009 4:26 PM

## 2010-11-17 NOTE — Letter (Signed)
Summary: [employer] [facility] [employer] [facility]   [employer] [facility]   Imported By: Roderic Ovens 05/21/2010 15:21:44  _____________________________________________________________________  External Attachment:    Type:   Image     Comment:   External Document

## 2010-11-17 NOTE — Procedures (Signed)
Summary: [facility]   Imported By: Lester [place] 01/13/2010 11:55:29  _____________________________________________________________________  External Attachment:    Type:   Image     Comment:   External Document

## 2010-11-17 NOTE — Progress Notes (Signed)
Summary: labwork   Phone Note Call from Patient Call back at [phone]   Caller: Patient Call For: Dr. Leone Payor Reason for Call: Talk to Nurse Summary of Call: would like to know her white blood cell count from blood work yesterday Initial call taken by: Vallarie Mare,  January 09, 2010 9:25 AM  Follow-up for Phone Call        reviewed Dr Marvell Fuller recommendations see CBC from 01-08-10.  REV scheduled for 02-19-10 8:30 Follow-up by: Darcey Nora RN, CGRN,  January 09, 2010 9:33 AM
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
## 2010-11-17 NOTE — Progress Notes (Signed)
Summary: INFO ONLY  Phone Note Call from Patient   Reason for Call: Talk to Doctor Summary of Call: Info Only------Pt called in to speak with Dr Tawanna Cooler about receiving a RX for pain med (post myelogram procedure) by Dr Gerlene Fee of [facility].... Pt was advised that she would need to contact Dr Trudee Grip office to obtain a RX for pain meds since the pain she is experiencing is coming from the myelogram procedure performed by Dr Gerlene Fee.... Pt was given # to [facility] [phone]) and she adv she would make contact with their office.  Initial call taken by: Debbra Riding,  November 19, 2009 9:43 AM  Follow-up for Phone Call        Provider Notified Follow-up by: Roderick Pee MD,  November 19, 2009 10:05 AM     Appended Document: INFO ONLY Pt called to cx appt that she had scheduled with Dr Tawanna Cooler on Thursday - 11/20/2009 @ 9am.

## 2010-11-17 NOTE — Progress Notes (Signed)
  Phone Note Other Incoming   Request: Send information Summary of Call: Received a completed [facility] medical release form from the patient requesting copies of all records from Dr. Leone Payor. She is uncertain of the beginning date. Patient made aware of the 7-10 business day process and given a copy of [facility] fee schedule which she refused to take with her.      Appended Document:  Request forwarded to [facility].

## 2010-11-17 NOTE — Assessment & Plan Note (Signed)
Summary: MOLE CHECK/NJR/pt rescd//ccm   Vital Signs:  Patient profile:   55 year old female Weight:      134 pounds Temp:     98.6 degrees F oral BP sitting:   118 / 86  (left arm) Cuff size:   regular  Vitals Entered By: Kern Reap CMA Duncan Dull) (April 24, 2010 12:19 PM) CC: mole check   Primary Care Provider:  Kelle Darting, MD  CC:  mole check.  History of Present Illness: [name] is a 55 year old, married female, smoker, who comes in today for evaluation of two problems.  She has a history of back pain and has neurosurgical evaluation by Dr. Gerlene Fee.  He arranged a second opinion however, the physician they referred her to not speak [place], and she had to talk through an interpreter.  We discussed the situation I would recommend she see Dr. Wyline Mood at [facility] [place].  She also has a skin tag on her right anterior chest wall with rubbing and irritated because his right were brought strep as she would like it removed.  Dr. Gerlene Fee has refused to give her refills on her pain medication.  She would like refills until she gets to see the neurosurgeon at [facility]  Allergies: 1)  ! * Antiinflammitories 2)  ! * Statins 3)  ! Prednisone (Prednisone) 4)  ! Bactrim (Sulfamethoxazole-Trimethoprim) 5)  Propofol (Propofol) 6)  Tylenol (Acetaminophen) 7)  Aspirin (Aspirin) 8)  * Barium  Past History:  Past medical, surgical, family and social histories (including risk factors) reviewed, and no changes noted (except as noted below).  Past Medical History: Reviewed history from 01/05/2010 and no changes required. MIGAINE HEADACHES TOBACCO ABUSE Asthma Hyperlipidemia Peripheral vascular disease Diverticulosis, colon IBS-diarrhea Prior C. 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 TAH-BSO Right ganglion cyst  Family History: Reviewed history from 06/16/2009 and no changes required. Family History Depression No FH of Colon Cancer:  Social History: Reviewed history from 01/05/2010 and no changes required. Patient currently smokes.  Patient has been counseled to quit.  Alcohol Use - yes rare Married Unemployed   Review of Systems      See HPI  Physical Exam  General:  Well-developed,well-nourished,in no acute distress; alert,appropriate and cooperative throughout examination Skin:  8-mm skin tag, right anterior chest wall, irritated and red, removed under local   Impression & Recommendations:  Problem # 1:  BACK PAIN, LUMBAR (ICD-724.2)  Her updated medication list for this problem includes:    Oxycodone Hcl 5 Mg Tabs (Oxycodone hcl) .Marland Kitchen... 1 by mouth every 4 hrs pain  Complete Medication List: 1)  Lisinopril 20 Mg Tabs (Lisinopril) .Marland Kitchen.. 1 tab once daily 2)  Tenoretic 100 100-25 Mg Tabs (Atenolol-chlorthalidone) .... 1/4 tab qam 3)  Qvar 40 Mcg/act Aers (Beclomethasone dipropionate) .... 2 puffs two times a day as needed 4)  Ventolin Hfa 108 (90 Base) Mcg/act Aers (Albuterol sulfate) .... 2 puffs 4x day as needed 5)  Epipen 2-pak 0.3 Mg/0.33ml (1:1000) Devi (Epinephrine hcl (anaphylaxis)) .... Prn 6)  Flonase 50 Mcg/act Susp (Fluticasone propionate) .... Use as directed 7)  Librax 2.5-5 Mg Caps (Clidinium-chlordiazepoxide) .... Take 2 tabs before each meal and 1 at bedtime 8)  Sammi (herbal Antidepressant)  .... Take one by mouth once daily 9)  Oxycodone Hcl 5 Mg Tabs (Oxycodone hcl) .Marland KitchenMarland KitchenMarland Kitchen  1 by mouth every 4 hrs pain 10)  Promethazine Hcl 25 Mg Tabs (Promethazine hcl) .... Take 1 tablet by mouth three times a day as needed 11)  Ambien 10 Mg Tabs (Zolpidem tartrate) .Marland Kitchen.. 1 tab @ bedtime as needed 12)  Lorazepam 1 Mg Tabs (Lorazepam) .... Take 1-2 every 8 hours as needed for anxietymust keep appt on july 7  for more refills 13)  Diphenoxylate-atropine 2.5-0.025 Mg Tabs (Diphenoxylate-atropine) .Marland Kitchen.. 1-2 tablets by mouth every 6 hours as needed 14)  Promethazine Hcl 25 Mg Supp (Promethazine hcl) .Marland Kitchen.. 1 per rectum as needed severe nausea/vomiting every 8-12 hours 15)  Effer-k 25 Meq Tbef (Potassium bicarbonate) .... Take one tab by mouth once daily  Patient Instructions: 1)  called the neurosurgeon, office here in [place], and have them refer you to Dr. Wyline Mood and associates at [facility] Prescriptions: EFFER-K 25 MEQ TBEF (POTASSIUM BICARBONATE) take one tab by mouth once daily  #90 x 3   Entered by:   Kern Reap CMA (AAMA)   Authorized by:   Roderick Pee MD   Signed by:   Kern Reap CMA (AAMA) on 04/24/2010   Method used:   Faxed to ...       [facility]  [address] (retail)       [phone] [address]       [place], [place]  [address]       Ph: [phone] or [phone]       Fax: [phone]   RxID:   [id number] OXYCODONE HCL 5 MG TABS (OXYCODONE HCL) 1 by mouth every 4 hrs pain  #100 x 0   Entered and Authorized by:   Roderick Pee MD   Signed by:   Roderick Pee MD on 04/24/2010   Method used:   Print then Give to Patient   RxID:   [id number]

## 2010-11-17 NOTE — Assessment & Plan Note (Signed)
Summary: fu on meds/njr   Vital Signs:  Patient profile:   55 year old female Weight:      138 pounds Temp:     98.7 degrees F oral BP sitting:   130 / 84  (left arm) Cuff size:   regular  Vitals Entered By: Kern Reap CMA Duncan Dull) (October 24, 2009 11:17 AM)  Reason for Visit follow up meds  Primary Care Provider:  Fredia Sorrow, MD   History of Present Illness: [name] is a 55 year old, married female, who comes in today for evaluation of a lump in her left breast.  About the first week in October of 2010 she noted a lump in her left breast.  She finally went to a medical clinic in [place].  This past Wednesday.  She was set up for diagnostic mammography, but now comes in here wanting her studies done in [place],  [place] not have a history of breast lumps in the past.  Negative family history for breast cancer.  At the medical clinic in [place].  She was also diagnosed to have a bladder infection.  She is currently on Cipro 500 mg b.i.d.  She continues to have severe back pain.  She is requesting Flexeril and Vicodin.  She takes one of each twice a day.  Her neurosurgeon, Dr. Glee Arvin has ordered a myelogram for further diagnostic evaluation.  I will refill her medicine for 30 days.  At this point I recommend she see Dr. Glee Arvin for further follow-up.    Allergies: 1)  ! * Antiinflammitories 2)  ! * Statins 3)  ! Prednisone (Prednisone) 4)  ! Bactrim (Sulfamethoxazole-Trimethoprim) 5)  Propofol (Propofol) 6)  Tylenol (Acetaminophen) 7)  Aspirin (Aspirin) 8)  * Barium  Past History:  Past medical, surgical, family and social histories (including risk factors) reviewed for relevance to current acute and chronic problems.  Past Medical History: Reviewed history from 09/03/2009 and no changes required. MIGAINE HEADACHES TOBACCO ABUSE Asthma Hyperlipidemia Peripheral vascular disease Diverticulosis, colon IBS-diarrhea TAH /?BSO Prior C. diff  colitis Anxiety/Depression Lactose Intolerance Hypertension Costochondritis and chest wall pain Spinal stenosis Fuch's Corneal Dystrophy  Past Surgical History: Reviewed history from 10/30/2007 and no changes required. Hysterectomy-BSO Right ganglion cyst  Family History: Reviewed history from 06/16/2009 and no changes required. Family History Depression No FH of Colon Cancer:  Social History: Reviewed history from 10/30/2007 and no changes required. Patient currently smokes.  Patient has been counseled to quit.  Alcohol Use - yes rare Married  Review of Systems      See HPI  Physical Exam  General:  Well-developed,well-nourished,in no acute distress; alert,appropriate and cooperative throughout examination Breasts:  on inspection.  Both breasts appear normal.  The area of concern is that the 6 o'clock position below her left nipple.  There are multiple, soft, movable, lesions throughout both breasts.   Impression & Recommendations:  Problem # 1:  BREAST MASS (ICD-611.72) Assessment New  Orders: Venipuncture ([address]) TLB-Lipid Panel (80061-LIPID) TLB-BMP (Basic Metabolic Panel-BMET) (80048-METABOL) TLB-CBC Platelet - w/Differential (85025-CBCD) TLB-Hepatic/Liver Function Pnl (80076-HEPATIC) TLB-TSH (Thyroid Stimulating Hormone) (84443-TSH)  Complete Medication List: 1)  Lisinopril 20 Mg Tabs (Lisinopril) .Marland Kitchen.. 1 tab once daily 2)  Tenoretic 100 100-25 Mg Tabs (Atenolol-chlorthalidone) .... 1/4 tab qam 3)  Flexeril 10 Mg Tabs (Cyclobenzaprine hcl) .... Take 1 tablet by mouth two times a day 4)  Promethazine Hcl 25 Mg Tabs (Promethazine hcl) .Marland Kitchen.. 1 tab as needed every 8 hours for miagrianes and/or nausea 5)  Diphenoxylate-atropine  2.5-0.025 Mg Tabs (Diphenoxylate-atropine) .Marland Kitchen.. 1-2 q 6 hrs prn 6)  Qvar 40 Mcg/act Aers (Beclomethasone dipropionate) .... 2 puffs two times a day as needed 7)  Ventolin Hfa 108 (90 Base) Mcg/act Aers (Albuterol sulfate) .... 2 puffs 4x  day as needed 8)  Epipen 2-pak 0.3 Mg/0.71ml (1:1000) Devi (Epinephrine hcl (anaphylaxis)) .... Prn 9)  Flonase 50 Mcg/act Susp (Fluticasone propionate) .... Use as directed 10)  Phenadoz 25 Mg Supp (Promethazine hcl) .Marland Kitchen.. 1 rectally three times a day as needed 11)  Lorazepam 1 Mg Tabs (Lorazepam) .Marland Kitchen.. 1-2 every 8 hrs as needed for anxiety 12)  Flexeril 10 Mg Tabs (Cyclobenzaprine hcl) .Marland Kitchen.. 1tab two times  as needed 13)  Librax 2.5-5 Mg Caps (Clidinium-chlordiazepoxide) .... Take 2 tabs before each meal and 1 at bedtime 14)  Florastor 250 Mg Caps (Saccharomyces boulardii) .... Take 1 tab twice daily x 30 days 15)  Align Caps (Probiotic product) .... Take 1 tab daily x 30 days 16)  Vicodin Es 7.5-750 Mg Tabs (Hydrocodone-acetaminophen) .... Take 1 tablet by mouth two times a day  Patient Instructions: 1)  call the breast Center and make an appointment to be seen next week for diagnostic mammography. 2)  He may take one Flexeril, and one Vicodin twice a day as needed for back pain.  I will give you a 30 day supply until you can see Dr. Wynetta Emery  to get his advice on how to proceed 3)  BMP prior to visit, ICD-9:.........v70.0 4)  Hepatic Panel prior to visit, ICD-9: 5)  Lipid Panel prior to visit, ICD-9: 6)  TSH prior to visit, ICD-9: 7)  CBC w/ Diff prior to visit, ICD-9: 8)  we will get your blood work today.  Set up a 30 minute appointment sometime in the next two weeks for general medical exam Prescriptions: VICODIN ES 7.5-750 MG TABS (HYDROCODONE-ACETAMINOPHEN) Take 1 tablet by mouth two times a day  #60 x 0   Entered and Authorized by:   Roderick Pee MD   Signed by:   Roderick Pee MD on 10/24/2009   Method used:   Print then Give to Patient   RxID:   [id number] FLEXERIL 10 MG TABS (CYCLOBENZAPRINE HCL) Take 1 tablet by mouth two times a day  #60 x 0   Entered and Authorized by:   Roderick Pee MD   Signed by:   Roderick Pee MD on 10/24/2009   Method used:   Print then Give to  Patient   RxID:   [id number]

## 2010-11-17 NOTE — Miscellaneous (Signed)
Summary: Controlled Substances Contract  Controlled Substances Contract   Imported By: Maryln Gottron 06/08/2010 10:44:49  _____________________________________________________________________  External Attachment:    Type:   Image     Comment:   External Document

## 2010-11-17 NOTE — Assessment & Plan Note (Signed)
Summary: m30/mm/per [name]/mm   Vital Signs:  Patient profile:   55 year old female Height:      63.5 inches Weight:      136 pounds O2 Sat:      97 % Temp:     98.5 degrees F oral Pulse rate:   97 / minute Pulse rhythm:   regular Resp:     20 per minute BP sitting:   134 / 80  Vitals Entered By: Kern Reap CMA Duncan Dull) (November 11, 2009 10:24 AM) CC: cpx Is Patient Diabetic? No   Primary Care Provider:  Fredia Sorrow, MD  CC:  cpx.  History of Present Illness: [name] is a 55 year old, married female ex smoker, who comes in today for general medical exam.  Her most pressing problem is the continued severe back pain.  She's had an evaluation by Dr. Glee Arvin, the neurosurgeon.  He's recommended further diagnostic studies, which she's not done yet.  She states she is afraid of having further studies done.  However, she is still in severe pain.  I recommended she call Dr. Glee Arvin today and continue the diagnostic workup.  She had a mammogram last week with ultrasound.  Because of a questionable palpable lump.  Mammogram and ultrasound were normal.  the lesions that are palpable throughout both breasts were told by the radiologist to be little fatty tumors not breast tissue  For hypertension.  She takes lisinopril 20 mg daily, and Tenoretic 100 -- 25 a quarter of a tablet daily.  For her asthma.  She takes Qvar and Ventolin p.r.n.  She continues to take Flexeril twice a day, and Vicodin twice a day for severe back pain.  Again, encouraged to go back and have an evaluation.  His also a number of medications from Dr. Concha Se for IBS.  She gets routine eye care.  Dental care.  Colonoscopy done by Dr. Concha Se.  Tetanus 2009  Allergies: 1)  ! * Antiinflammitories 2)  ! * Statins 3)  ! Prednisone (Prednisone) 4)  ! Bactrim (Sulfamethoxazole-Trimethoprim) 5)  Propofol (Propofol) 6)  Tylenol (Acetaminophen) 7)  Aspirin (Aspirin) 8)  * Barium  Past History:  Past medical, surgical,  family and social histories (including risk factors) reviewed, and no changes noted (except as noted below).  Past Medical History: Reviewed history from 09/03/2009 and no changes required. MIGAINE HEADACHES TOBACCO ABUSE Asthma Hyperlipidemia Peripheral vascular disease Diverticulosis, colon IBS-diarrhea TAH /?BSO Prior C. diff colitis Anxiety/Depression Lactose Intolerance Hypertension Costochondritis and chest wall pain Spinal stenosis Fuch's Corneal Dystrophy  Past Surgical History: Reviewed history from 10/30/2007 and no changes required. Hysterectomy-BSO Right ganglion cyst  Family History: Reviewed history from 06/16/2009 and no changes required. Family History Depression No FH of Colon Cancer:  Social History: Reviewed history from 10/30/2007 and no changes required. Patient currently smokes.  Patient has been counseled to quit.  Alcohol Use - yes rare Married  Review of Systems      See HPI  Physical Exam  General:  Well-developed,well-nourished,in no acute distress; alert,appropriate and cooperative throughout examination Head:  Normocephalic and atraumatic without obvious abnormalities. No apparent alopecia or balding. Eyes:  No corneal or conjunctival inflammation noted. EOMI. Perrla. Funduscopic exam benign, without hemorrhages, exudates or papilledema. Vision grossly normal. Ears:  External ear exam shows no significant lesions or deformities.  Otoscopic examination reveals clear canals, tympanic membranes are intact bilaterally without bulging, retraction, inflammation or discharge. Hearing is grossly normal bilaterally. Nose:  External nasal examination shows no deformity or inflammation. Nasal  mucosa are pink and moist without lesions or exudates. Mouth:  Oral mucosa and oropharynx without lesions or exudates.  Teeth in good repair. Neck:  No deformities, masses, or tenderness noted. Chest Wall:  No deformities, masses, or tenderness noted. Breasts:   No mass, nodules, thickening, tenderness, bulging, retraction, inflamation, nipple discharge or skin changes noted.   Lungs:  Normal respiratory effort, chest expands symmetrically. Lungs are clear to auscultation, no crackles or wheezes. Heart:  Normal rate and regular rhythm. S1 and S2 normal without gallop, murmur, click, rub or other extra sounds. Abdomen:  Bowel sounds positive,abdomen soft and non-tender without masses, organomegaly or hernias noted. Genitalia:  TAH and BSO 15 years ago for nonmalignant reasons Msk:  No deformity or scoliosis noted of thoracic or lumbar spine.   Pulses:  R and L carotid,radial,femoral,dorsalis pedis and posterior tibial pulses are full and equal bilaterally Extremities:  No clubbing, cyanosis, edema, or deformity noted with normal full range of motion of all joints.   Neurologic:  No cranial nerve deficits noted. Station and gait are normal. Plantar reflexes are down-going bilaterally. DTRs are symmetrical throughout. Sensory, motor and coordinative functions appear intact. Skin:  Intact without suspicious lesions or rashes Cervical Nodes:  No lymphadenopathy noted Axillary Nodes:  No palpable lymphadenopathy Inguinal Nodes:  No significant adenopathy Psych:  Cognition and judgment appear intact. Alert and cooperative with normal attention span and concentration. No apparent delusions, illusions, hallucinations   Impression & Recommendations:  Problem # 1:  ANXIETY DEPRESSION (ICD-300.4) Assessment Unchanged  Orders: Prescription Created Electronically [id number])  Problem # 2:  HYPERTENSION (ICD-401.9) Assessment: Improved  Her updated medication list for this problem includes:    Lisinopril 20 Mg Tabs (Lisinopril) .Marland Kitchen... 1 tab once daily    Tenoretic 100 100-25 Mg Tabs (Atenolol-chlorthalidone) .Marland Kitchen... 1/4 tab qam  Orders: Prescription Created Electronically [id number])  Problem # 3:  HEALTH SCREENING (ICD-V70.0) Assessment: Unchanged  Problem # 4:   BACK PAIN, LUMBAR (ICD-724.2)  Her updated medication list for this problem includes:    Flexeril 10 Mg Tabs (Cyclobenzaprine hcl) .Marland Kitchen... Take 1 tablet by mouth two times a day    Flexeril 10 Mg Tabs (Cyclobenzaprine hcl) .Marland Kitchen... 1tab two times  as needed    Vicodin Es 7.5-750 Mg Tabs (Hydrocodone-acetaminophen) .Marland Kitchen... Take 1 tablet by mouth three times a day  Complete Medication List: 1)  Lisinopril 20 Mg Tabs (Lisinopril) .Marland Kitchen.. 1 tab once daily 2)  Tenoretic 100 100-25 Mg Tabs (Atenolol-chlorthalidone) .... 1/4 tab qam 3)  Flexeril 10 Mg Tabs (Cyclobenzaprine hcl) .... Take 1 tablet by mouth two times a day 4)  Promethazine Hcl 25 Mg Tabs (Promethazine hcl) .Marland Kitchen.. 1 tab as needed every 8 hours for miagrianes and/or nausea 5)  Diphenoxylate-atropine 2.5-0.025 Mg Tabs (Diphenoxylate-atropine) .Marland Kitchen.. 1-2 q 6 hrs prn 6)  Qvar 40 Mcg/act Aers (Beclomethasone dipropionate) .... 2 puffs two times a day as needed 7)  Ventolin Hfa 108 (90 Base) Mcg/act Aers (Albuterol sulfate) .... 2 puffs 4x day as needed 8)  Epipen 2-pak 0.3 Mg/0.105ml (1:1000) Devi (Epinephrine hcl (anaphylaxis)) .... Prn 9)  Flonase 50 Mcg/act Susp (Fluticasone propionate) .... Use as directed 10)  Phenadoz 25 Mg Supp (Promethazine hcl) .Marland Kitchen.. 1 rectally three times a day as needed 11)  Lorazepam 1 Mg Tabs (Lorazepam) .Marland Kitchen.. 1-2 every 8 hrs as needed for anxiety 12)  Flexeril 10 Mg Tabs (Cyclobenzaprine hcl) .Marland Kitchen.. 1tab two times  as needed 13)  Librax 2.5-5 Mg Caps (Clidinium-chlordiazepoxide) .... Take 2 tabs  before each meal and 1 at bedtime 14)  Florastor 250 Mg Caps (Saccharomyces boulardii) .... Take 1 tab twice daily x 30 days 15)  Align Caps (Probiotic product) .... Take 1 tab daily x 30 days 16)  Vicodin Es 7.5-750 Mg Tabs (Hydrocodone-acetaminophen) .... Take 1 tablet by mouth three times a day 17)  Klor-con M20 20 Meq Cr-tabs (Potassium chloride crys cr) .... Take one tab once daily  Patient Instructions: 1)  called the  neurosurgeon today, and lets move forward on the evaluation of your back pain. 2)  Please schedule a follow-up appointment in 1 year. 3)  we also need to address the smoking issue one half pack per day and the hyperlipidemia.  However, I would address her back issues.  First.  Come back and see [place] in May or June.  Once the back issue is resolved, and we will discuss the above issues Prescriptions: KLOR-CON M20 20 MEQ CR-TABS (POTASSIUM CHLORIDE CRYS CR) take one tab once daily  #100 x 3   Entered by:   Kern Reap CMA (AAMA)   Authorized by:   Roderick Pee MD   Signed by:   Kern Reap CMA (AAMA) on 11/11/2009   Method used:   Faxed to ...       [facility]  [address] (retail)       [phone] [address]       [place], [place]  [address]       Ph: [phone] or [phone]       Fax: [phone]   RxID:   [id number] VICODIN ES 7.5-750 MG TABS (HYDROCODONE-ACETAMINOPHEN) Take 1 tablet by mouth three times a day  #60 x 1   Entered and Authorized by:   Roderick Pee MD   Signed by:   Roderick Pee MD on 11/11/2009   Method used:   Print then Give to Patient   RxID:   [id number] KLOR-CON 20 MEQ PACK (POTASSIUM CHLORIDE) Take 1 tablet by mouth every morning  #100 x 3   Entered and Authorized by:   Roderick Pee MD   Signed by:   Roderick Pee MD on 11/11/2009   Method used:   Electronically to        [facility]  [address] (retail)       [address]       [place], [place]  [address]       Ph: [phone] or [phone]       Fax: [phone]   RxID:   [id number] TENORETIC 100 100-25 MG  TABS (ATENOLOL-CHLORTHALIDONE) 1/4 tab qam  #50 x 3   Entered and Authorized by:   Roderick Pee MD   Signed by:   Roderick Pee MD on 11/11/2009   Method used:   Electronically to        [facility]  [address] (retail)       [address]       [place], [place]  [address]       Ph: [phone] or [phone]       Fax:  [phone]   RxID:   [id number] LISINOPRIL 20 MG  TABS (LISINOPRIL) 1 tab once daily  #100 x 3   Entered and Authorized by:   Roderick Pee MD   Signed by:  Roderick Pee MD on 11/11/2009   Method used:   Electronically to        [facility] (retail)       [phone]       [place], [place]  [address]       Ph: [phone] or [phone]       Fax: [phone]   RxID:   [id number]

## 2010-11-17 NOTE — Progress Notes (Signed)
Summary: med refill   Phone Note Call from Patient Call back at Home Phone [phone]   Caller: Patient Call For: Dr. Leone Payor Reason for Call: Refill Medication Summary of Call: Lorazapam refill needed, pt states its not at pharmacy... [facility] [address] in [place] Initial call taken by: Vallarie Mare,  Mar 06, 2010 2:58 PM  Follow-up for Phone Call        see eRx request dating 03/05/10. Follow-up by: Francee Piccolo CMA Duncan Dull),  Mar 06, 2010 3:09 PM

## 2010-11-17 NOTE — Progress Notes (Signed)
Summary: Refill denied-why?  Phone Note Call from Patient Call back at [phone]   Call For: Dr Leone Payor Summary of Call: Wonders why her prescription refill was denied. Initial call taken by: Leanor Kail [facility],  November 27, 2009 11:11 AM  Follow-up for Phone Call        Advised pt that both prescriptions had been refilled.  Lorazepam needed to be denied electronically and faxed and that may be whatt he pharmacist saw her and told her.  Pt voices understanding.  She will check with pharmacy later today for both scripts. Follow-up by: Francee Piccolo CMA Duncan Dull),  November 27, 2009 11:17 AM

## 2010-11-17 NOTE — Progress Notes (Signed)
Summary: Pt having trouble with antibiotics alot of pain  Phone Note Call from Patient Call back at Home Phone [phone]   Caller: Patient Summary of Call: Pt called and said that the antibiotic is tearing her stomach up. She said that it feels like glass even if she takes with food. Pt is in a lot of pain. Pt is wantint a call back from the nurse asap.  Pt saw the GI MD, and they want to take the oral Cipro for an UTI.  Patient wants to take IV antibiotics for the infection, a referral to URO ASAP. [phone] Initial call taken by: Lucy Antigua,  December 08, 2009 9:06 AM  Follow-up for Phone Call        [place] .........Marland Kitchen refer to urology Follow-up by: Roderick Pee MD,  December 08, 2009 9:28 AM

## 2010-11-17 NOTE — Assessment & Plan Note (Signed)
Summary: follow up admission to [facility] hospital/sheri    History of Present Illness Visit Type: Follow-up Visit Primary GI MD: Stan Head MD [facility] Primary Provider: Kelle Darting, MD Requesting Provider: n/a Chief Complaint: Low grade fever, nausea, and generalized abd pain  History of Present Illness:   Two admits to [facility] of back pain and vomiting, controlled.  Then she had recurrence about 1-2 days later and was admited, EGD ad biopsy Chales Abrahams). Told to see me imediately. Since discharge she is tired, no vomiting. When she awakens she tales promethazine and bland diet. Diffuse abdominal pain. Although back pain was part of it thinks abdominal pain was initial event also. she had increased her narcotic analgesics and she thinks the acetamniophen in the combination medication was making her nausea. She is better now on oxycodone without acetaminophen. Struggling to defecate. Back pain - "I don't fel it because I am concentrated on everything else" Sleeping alot.            Current Medications (verified): 1)  Lisinopril 20 Mg  Tabs (Lisinopril) .Marland Kitchen.. 1 Tab Once Daily 2)  Tenoretic 100 100-25 Mg  Tabs (Atenolol-Chlorthalidone) .... 1/4 Tab Qam 3)  Promethazine Hcl 25 Mg  Tabs (Promethazine Hcl) .Marland Kitchen.. 1 Tab As Needed Every 8 Hours For Miagrianes And/or Nausea 4)  Diphenoxylate-Atropine 2.5-0.025 Mg  Tabs (Diphenoxylate-Atropine) .Marland Kitchen.. 1-2 Q 6 Hrs Prn 5)  Qvar 40 Mcg/act  Aers (Beclomethasone Dipropionate) .... 2 Puffs Two Times A Day As Needed 6)  Ventolin Hfa 108 (90 Base) Mcg/act  Aers (Albuterol Sulfate) .... 2 Puffs 4x Day As Needed 7)  Epipen 2-Pak 0.3 Mg/0.33ml (1:1000)  Devi (Epinephrine Hcl (Anaphylaxis)) .... Prn 8)  Flonase 50 Mcg/act  Susp (Fluticasone Propionate) .... Use As Directed 9)  Phenadoz 25 Mg Supp (Promethazine Hcl) .Marland Kitchen.. 1 Rectally Three Times A Day As Needed 10)  Lorazepam 1 Mg  Tabs (Lorazepam) .Marland Kitchen.. 1-2 Every 8 Hrs As Needed For Anxiety 11)   Librax 2.5-5 Mg Caps (Clidinium-Chlordiazepoxide) .... Take 2 Tabs Before Each Meal and 1 At Bedtime 12)  Klor-Con M20 20 Meq Cr-Tabs (Potassium Chloride Crys Cr) .... Take One Tab Once Daily 13)  Sammi (Herbal Antidepressant) .... Take One By Mouth Once Daily 14)  Ondansetron Hcl 4 Mg Tabs (Ondansetron Hcl) .... Dissolve One Tab By Mouth Every 6 Hours As Needed 15)  Protonix 40 Mg Tbec (Pantoprazole Sodium) .... Take One Tab Once Daily 16)  Oxycodone Hcl 5 Mg Tabs (Oxycodone Hcl) .Marland Kitchen.. 1 By Mouth Every 4 Hrs Pain  Allergies (verified): 1)  ! * Antiinflammitories 2)  ! * Statins 3)  ! Prednisone (Prednisone) 4)  ! Bactrim (Sulfamethoxazole-Trimethoprim) 5)  Propofol (Propofol) 6)  Tylenol (Acetaminophen) 7)  Aspirin (Aspirin) 8)  * Barium  CT Abdomen/Pelvis  Procedure date:  12/27/2009  Findings:      Mural thickening of small bowel loop Discussed with radiologist - small segment scattered colonic diverticula  with oral and IV contrast [facility]  EGD  Procedure date:  12/29/2009  Findings:      Irregular Z-line ? GERD - biopsied - inflammation and 'Barrett's type metaplasia' no dysplasia Hiatral hernia mild gastritis  CT Brain  Procedure date:  12/27/2009  Findings:      no acute findings  unenhanced [facility]  Procedures Next Due Date:    EGD: 12/2010   Past History:  Past Medical History: MIGAINE HEADACHES TOBACCO ABUSE Asthma Hyperlipidemia Peripheral vascular disease Diverticulosis, colon IBS-diarrhea Prior C. diff colitis  Anxiety/Depression Lactose Intolerance Hypertension Costochondritis and chest wall pain Spinal stenosis Fuch's Corneal Dystrophy ? Barrett's esophagus 12/2009 EGD  Past Surgical History: Reviewed history from 12/04/2009 and no changes required. TAH-BSO Right ganglion cyst  Family History: Reviewed history from 06/16/2009 and no changes required. Family History Depression No FH of Colon Cancer:  Social  History: Patient currently smokes.  Patient has been counseled to quit.  Alcohol Use - yes rare Married Unemployed   Review of Systems       Trying to get steroid injection - epidural pending wbc count  Vital Signs:  Patient profile:   55 year old female Height:      63.5 inches Weight:      142 pounds BMI:     24.85 BSA:     1.68 Temp:     98.4 degrees F oral Pulse rate:   76 / minute Pulse rhythm:   regular BP sitting:   126 / 80  (left arm) Cuff size:   regular  Vitals Entered By: Ok Anis CMA (January 05, 2010 2:29 PM)  Physical Exam  General:  mildly chronically ill Eyes:  anicteric Lungs:  scattered wheeze Heart:  Regular rate and rhythm; no murmurs, rubs,  or bruits. Abdomen:  soft BS+ diffusely tender Neurologic:  awake, a&o x 3 Psych:  appropriate   Impression & Recommendations:  Problem # 1:  GASTROINTESTINAL XRAY, ABNORMAL (ICD-793.4) Assessment New Thickened loop of small bowel. I called radiolgist and it could be spasm or transient intususception. I highly doubt Crohn's. will observe for no and not repeat imaging.  Problem # 2:  NAUSEA AND VOMITING (ICD-787.01) Assessment: New ? functional vs. infectious or both. continue symptom-directed therapy Orders: TLB-CBC Platelet - w/Differential (85025-CBCD)  Problem # 3:  IRRITABLE BOWEL SYNDROME (ICD-564.1) Assessment: Deteriorated continue symptom-directed therapy  Problem # 4:  DEGENERATIVE DISC DISEASE, LUMBAR SPINE (ICD-722.52) awaiting steroid injection when WBC nrmal  Problem # 5:  ? of BARRETTS ESOPHAGUS (ICD-530.85) Assessment: New noted after she left upon review of records will plan to repeat EGD in 1 year  Patient Instructions: 1)  Please go to the basement to have your lab tests drawn today. 2)  CBC may be drawn tomorrow. 3)  We will call you with further follow up after we have reviewed your CBC and CT results. 4)  Please pick up your medications at your pharmacy. LIBRAX 5)   The medication list was reviewed and reconciled.  All changed / newly prescribed medications were explained.  A complete medication list was provided to the patient / caregiver. Prescriptions: LIBRAX 2.5-5 MG CAPS (CLIDINIUM-CHLORDIAZEPOXIDE) take 2 tabs before each meal and 1 at bedtime  #210 x 2   Entered and Authorized by:   Iva Boop MD, [facility]   Signed by:   Iva Boop MD, [facility] on 01/05/2010   Method used:   Printed then faxed to ...       [facility]  [address] (retail)       [phone] [address]       [place], [place]  [address]       Ph: [phone] or [phone]       Fax: [phone]   RxID:   [id number]  Will plan to observe and support at this time REV with me in 4-6 weeks hopefully control of back pain will help things overall psychosocial issues surrounding mother's death quite possibly still playing a role.

## 2010-11-17 NOTE — Assessment & Plan Note (Signed)
Summary: c diff flare up/njr   Vital Signs:  Patient profile:   55 year old female Temp:     98.9 degrees F oral BP sitting:   120 / 80  (left arm) Cuff size:   regular  Vitals Entered By: Kern Reap CMA Duncan Dull) (December 02, 2009 12:30 PM)  Reason for Visit c diff flare up  Primary Care Provider:  Fredia Sorrow, MD   History of Present Illness: Arly is a 55 year old female, married, smoker comes in today for evaluation of abdominal pain.  She is dealing with back pain and IBS.  We saw her on February the 10th for UTI and started on Cipro 5 mg b.i.d..  She came in today thinking she might have Clostridium difficile colitis from the Cipro.  However, she has no diarrhea.  She states she awoke Monday a.m. with severe abdominal pain.  She points to the midepigastric area, and the left lower quadrant area as the source for pain.  She describes as constant, sharp.  There is from a 5 to 10.  However, she is able to go to sleep and when she does she sleeps well and the pain does not wake her up.  She's had no fever, vomiting, or diarrhea.  Allergies: 1)  ! * Antiinflammitories 2)  ! * Statins 3)  ! Prednisone (Prednisone) 4)  ! Bactrim (Sulfamethoxazole-Trimethoprim) 5)  Propofol (Propofol) 6)  Tylenol (Acetaminophen) 7)  Aspirin (Aspirin) 8)  * Barium  Past History:  Past medical, surgical, family and social histories (including risk factors) reviewed for relevance to current acute and chronic problems.  Past Medical History: Reviewed history from 09/03/2009 and no changes required. MIGAINE HEADACHES TOBACCO ABUSE Asthma Hyperlipidemia Peripheral vascular disease Diverticulosis, colon IBS-diarrhea TAH /?BSO Prior C. diff colitis Anxiety/Depression Lactose Intolerance Hypertension Costochondritis and chest wall pain Spinal stenosis Fuch's Corneal Dystrophy  Past Surgical History: Reviewed history from 10/30/2007 and no changes required. Hysterectomy-BSO Right  ganglion cyst  Family History: Reviewed history from 06/16/2009 and no changes required. Family History Depression No FH of Colon Cancer:  Social History: Reviewed history from 10/30/2007 and no changes required. Patient currently smokes.  Patient has been counseled to quit.  Alcohol Use - yes rare Married  Review of Systems      See HPI  Physical Exam  General:  Well-developed,well-nourished,in no acute distress; alert,appropriate and cooperative throughout examination Abdomen:  the abdomen is flat.  The bowel sounds are normal.  There is diffuse abdominal tenderness.  No rebound   Impression & Recommendations:  Problem # 1:  IRRITABLE BOWEL SYNDROME (ICD-564.1) Assessment Deteriorated  Complete Medication List: 1)  Lisinopril 20 Mg Tabs (Lisinopril) .Marland Kitchen.. 1 tab once daily 2)  Tenoretic 100 100-25 Mg Tabs (Atenolol-chlorthalidone) .... 1/4 tab qam 3)  Flexeril 10 Mg Tabs (Cyclobenzaprine hcl) .... Take 1 tablet by mouth two times a day 4)  Promethazine Hcl 25 Mg Tabs (Promethazine hcl) .Marland Kitchen.. 1 tab as needed every 8 hours for miagrianes and/or nausea 5)  Diphenoxylate-atropine 2.5-0.025 Mg Tabs (Diphenoxylate-atropine) .Marland Kitchen.. 1-2 q 6 hrs prn 6)  Qvar 40 Mcg/act Aers (Beclomethasone dipropionate) .... 2 puffs two times a day as needed 7)  Ventolin Hfa 108 (90 Base) Mcg/act Aers (Albuterol sulfate) .... 2 puffs 4x day as needed 8)  Epipen 2-pak 0.3 Mg/0.67ml (1:1000) Devi (Epinephrine hcl (anaphylaxis)) .... Prn 9)  Flonase 50 Mcg/act Susp (Fluticasone propionate) .... Use as directed 10)  Phenadoz 25 Mg Supp (Promethazine hcl) .Marland Kitchen.. 1 rectally three times a day  as needed 11)  Lorazepam 1 Mg Tabs (Lorazepam) .Marland Kitchen.. 1-2 every 8 hrs as needed for anxiety 12)  Librax 2.5-5 Mg Caps (Clidinium-chlordiazepoxide) .... Take 2 tabs before each meal and 1 at bedtime 13)  Florastor 250 Mg Caps (Saccharomyces boulardii) .... Take 1 tab twice daily x 30 days 14)  Align Caps (Probiotic product)  .... Take 1 tab daily x 30 days 15)  Vicodin Es 7.5-750 Mg Tabs (Hydrocodone-acetaminophen) .... Take 1 tablet by mouth three times a day 16)  Klor-con M20 20 Meq Cr-tabs (Potassium chloride crys cr) .... Take one tab once daily 17)  Valium 5 Mg Tabs (Diazepam) .... As directed 18)  Ciprofloxacin Hcl 500 Mg Tabs (Ciprofloxacin hcl) .... Take 1 tablet by mouth two times a day x 7 days, then 1 at bedtime x 3 weeks  Patient Instructions: 1)  hold the Cipro today and tomorrow restart on Thursday by taking half a tablet twice a day for 10 days. 2)  We have a call in for GI to see you ASAP

## 2010-11-17 NOTE — Progress Notes (Signed)
Summary: [name], did you call her?   Phone Note Call from Patient Call back at [phone]   Call For: Willette Cluster, NP Summary of Call: Saw our number on her caller id and wonders if maybe [name] called her. Initial call taken by: Leanor Kail [facility],  December 05, 2009 10:30 AM  Follow-up for Phone Call        I called the pt and she thinks it may have been Dr.Todd's office may have tried to call her.  I reminded her to call us if she gets diarrhea when taking the Cipro she is resuming. She thanked me for calling her.

## 2010-11-17 NOTE — Progress Notes (Signed)
Summary: Nausea/vomiting   Phone Note Call from Patient Call back at [facility] Phone [phone]   Caller: Patient Call For: Leone Payor Summary of Call: Patient  was seen in the ER last night and told "bad urinary tract/bladder infection".  Patient  started vomiting again this am.  Wants to know what to do.  She is advised to continue her phenergan suppositories prescribed, and contact her primary care for UTI.  She is unable to take her antibiotics due to nausea/vomiting.  Patient  advised me she has heard back from [facility] and she has an appointment with them on 05/15/10.  Please advise if needs any additional orders. [phone] Initial call taken by: Darcey Nora RN, CGRN,  April 29, 2010 8:27 AM  Follow-up for Phone Call        Pt is calling stating she did not get the Cipro for her UTI from the ER, has called Dr. Leone Payor and is waiting for a reply due to her N/V.  Cannot keep liquids down.  Thinking about calling the URO office or going back to the ER for IVs.  Suggested she go back to the ER for possible anitbioitic IV therapy and hydration.  Pt's thought process seems very scattered when attempting to explain her symptoms, and recent visits to the ER.   Follow-up by: Lynann Beaver CMA,  April 29, 2010 9:08 AM  Additional Follow-up for Phone Call Additional follow up Details #1::        She needs treatment for UTI and sounds like she may need admission if unable to keep by mouth's down. She should go back to ED to be reassessed. Additional Follow-up by: Iva Boop MD, [facility] Graham,  April 29, 2010 9:20 AM    Additional Follow-up for Phone Call Additional follow up Details #2::    Patient  advised of Dr Marvell Fuller recommendations. Follow-up by: Darcey Nora RN, CGRN,  April 29, 2010 9:22 AM

## 2010-11-17 NOTE — Assessment & Plan Note (Signed)
Summary: [facility]UP ER---BACK PAIN//CCM   Vital Signs:  Patient profile:   55 year old female BP sitting:   140 / 80  (left arm) Cuff size:   regular  Vitals Entered By: Kern Reap CMA Duncan Dull) (December 25, 2009 1:42 PM)  Reason for Visit follow up er, back pain  Contraindications/Deferment of Procedures/Staging:    Test/Procedure: Weight Refused    Reason for deferment: patient declined-cannot calculate BMI   Primary Care Provider:  Fredia Sorrow, MD   History of Present Illness: [name] is a 55 year old, married female, smoker comes in today for evaluation of reflux esophagitis.  She states she developed severe pain and went to the emergency room.  At that time.  Her blood pressure was elevated, but now is back to normal 140/80.  She's been compliant with her medication.  Electrolytes in the emergency room were normal.  They gave her IV protonix , and she had a tremendous decrease in her symptoms of chest pain.  She would like to go back on the protonix  is and she would like the brand product.  It might be worthwhile for her insurance to pay for this product since it might prevent another emergency room visit  She also tells me, that she's been to see Dr. Gerlene Fee, the neurosurgeon.  He feels like there is something pressing on her spinal cord despite the fact that it does not show up on the MRI or the myelogram.  Then, the process of arranging for her to go to [facility] to get a second opinion  Allergies: 1)  ! * Antiinflammitories 2)  ! * Statins 3)  ! Prednisone (Prednisone) 4)  ! Bactrim (Sulfamethoxazole-Trimethoprim) 5)  Propofol (Propofol) 6)  Tylenol (Acetaminophen) 7)  Aspirin (Aspirin) 8)  * Barium  Social History: Reviewed history from 10/30/2007 and no changes required. Patient currently smokes.  Patient has been counseled to quit.  Alcohol Use - yes rare Married  Review of Systems      See HPI  Physical Exam  General:  Well-developed,well-nourished,in no  acute distress; alert,appropriate and cooperative throughout examination   Problems:  Medical Problems Added: 1)  Dx of Gerd  (ICD-530.81)  Impression & Recommendations:  Problem # 1:  GERD (ICD-530.81) Assessment Improved  Her updated medication list for this problem includes:    Librax 2.5-5 Mg Caps (Clidinium-chlordiazepoxide) .Marland Kitchen... Take 2 tabs before each meal and 1 at bedtime    Protonix 40 Mg Tbec (Pantoprazole sodium) .Marland Kitchen... Take one tab once daily  Orders: Prescription Created Electronically [phone])  Complete Medication List: 1)  Lisinopril 20 Mg Tabs (Lisinopril) .Marland Kitchen.. 1 tab once daily 2)  Tenoretic 100 100-25 Mg Tabs (Atenolol-chlorthalidone) .... 1/4 tab qam 3)  Flexeril 10 Mg Tabs (Cyclobenzaprine hcl) .... Take 1 tablet by mouth two times a day 4)  Promethazine Hcl 25 Mg Tabs (Promethazine hcl) .Marland Kitchen.. 1 tab as needed every 8 hours for miagrianes and/or nausea 5)  Diphenoxylate-atropine 2.5-0.025 Mg Tabs (Diphenoxylate-atropine) .Marland Kitchen.. 1-2 q 6 hrs prn 6)  Qvar 40 Mcg/act Aers (Beclomethasone dipropionate) .... 2 puffs two times a day as needed 7)  Ventolin Hfa 108 (90 Base) Mcg/act Aers (Albuterol sulfate) .... 2 puffs 4x day as needed 8)  Epipen 2-pak 0.3 Mg/0.38ml (1:1000) Devi (Epinephrine hcl (anaphylaxis)) .... Prn 9)  Flonase 50 Mcg/act Susp (Fluticasone propionate) .... Use as directed 10)  Phenadoz 25 Mg Supp (Promethazine hcl) .Marland Kitchen.. 1 rectally three times a day as needed 11)  Lorazepam 1 Mg Tabs (Lorazepam) .Marland KitchenMarland KitchenMarland Kitchen  1-2 every 8 hrs as needed for anxiety 12)  Librax 2.5-5 Mg Caps (Clidinium-chlordiazepoxide) .... Take 2 tabs before each meal and 1 at bedtime 13)  Vicodin Es 7.5-750 Mg Tabs (Hydrocodone-acetaminophen) .... Take 1 tablet by mouth three times a day 14)  Klor-con M20 20 Meq Cr-tabs (Potassium chloride crys cr) .... Take one tab once daily 15)  Ciprofloxacin Hcl 500 Mg Tabs (Ciprofloxacin hcl) .... Take 1 tablet by mouth two times a day x 7 days, then 1 at  bedtime x 3 weeks (holding) 16)  Sammi (herbal Antidepressant)  .... Take one by mouth once daily 17)  Ondansetron Hcl 4 Mg Tabs (Ondansetron hcl) .... Dissolve one tab by mouth every 6 hours as needed 18)  Protonix 40 Mg Tbec (Pantoprazole sodium) .... Take one tab once daily  Patient Instructions: 1)  I would recommend you take the protime next one or two daily.  Take Phenergan p.r.n. for nausea.  We also discussed.  All the other anti-reflux measures Prescriptions: PROMETHAZINE HCL 25 MG  TABS (PROMETHAZINE HCL) 1 tab as needed every 8 hours for miagrianes and/or nausea  #60 Tablet x 0   Entered and Authorized by:   Roderick Pee MD   Signed by:   Roderick Pee MD on 12/25/2009   Method used:   Electronically to        [facility]  [address] (retail)       [address]       [place], [place]  [address]       Ph: [phone] or [phone]       Fax: [phone]   RxID:   [id number] PROTONIX 40 MG TBEC (PANTOPRAZOLE SODIUM) take one tab once daily  #100 x 3   Entered and Authorized by:   Roderick Pee MD   Signed by:   Roderick Pee MD on 12/25/2009   Method used:   Print then Give to Patient   RxID:   [id number]

## 2010-11-19 NOTE — Progress Notes (Signed)
  Phone Note Other Incoming   Request: Send information Summary of Call: Request for records received from [facility]. Request forwarded to [employer].

## 2010-11-19 NOTE — Letter (Signed)
Summary: USPS return receipt  Certified Letter Signed - Patient   Imported By: Debby Freiberg 08/21/2010 11:40:33  _____________________________________________________________________  External Attachment:    Type:   Image     Comment:   External Document

## 2010-11-19 NOTE — Progress Notes (Signed)
Summary: Gastroenterology agrees with Dismissal  Phone Note Other Incoming   Caller: Lenard Forth Summary of Call: Dr. Leone Payor,  The reason listed is "unproductive patient-physician relationship". Dr. Tawanna Cooler may be able to elaborate. Thanks, Lenard Forth  October 23, 2010 2:45 PM   Follow-up for Phone Call       Follow-up by: Lenard Forth  Additional Follow-up for Phone Call Additional follow up Details #1::        I agree with dismissal Additional Follow-up by: Iva Boop MD, Clement[facility] Graham,  October 23, 2010 4:05 PM    This patient is dismissed from Primary Care by Dr. Tawanna Cooler. Do you agree with the dismissal or do you wish to continue to treat this patient? Thank You.Lenard Forth  August 12, 2010 1:26 PM  Why was she dismissed? Iva Boop MD, [facility]  October 10, 2010 7:14 AM

## 2010-11-22 IMAGING — CT CT CERVICAL SPINE W/ CM
3 of 14 series · 7 of 33 positions shown, 8 images · IV contrast (omnipaque)
Comparison: MRI of the lumbar spine 12/17/2007.  There are no
comparisons for the cervical myelogram.

CLINICAL DATA: Low back pain extending into the left lower
extremity.  Neck pain.  Bilateral arm and hand numbness.

MYELOGRAM INJECTION
TECHNIQUE: Informed consent was obtained from the patient prior to
the procedure, including potential complications of headache,
allergy, infection and pain.  A timeout procedure was performed.
With the patient prone, the lower back was prepped with Betadine.
1% Lidocaine was used for local anesthesia.  Lumbar puncture was
performed at the right paramidline L2-3 level using a 22 gauge
needle with return of clear CSF.  10 ml of Omnipaque 322was
injected into the subarachnoid space .
TECHNIQUE: Following injection of intrathecal Omnipaque contrast,
spine imaging in multiple projections was performed using
fluoroscopy.
Fluoroscopy Time: 1.5 minutes.
TECHNIQUE: CT imaging of the cervical spine was performed after
intrathecal contrast administration. Multiplanar CT image
reconstructions were also generated.
TECHNIQUE: CT imaging of the lumbar spine was performed after
intrathecal contrast administration.  Multiplanar CT image

[Series 5: l spine bone · axial · 0.27mm/px · z∈[-374,-149]mm · 2 of 91 slices shown, 3 images]
[im 1/91  soft-tissue]
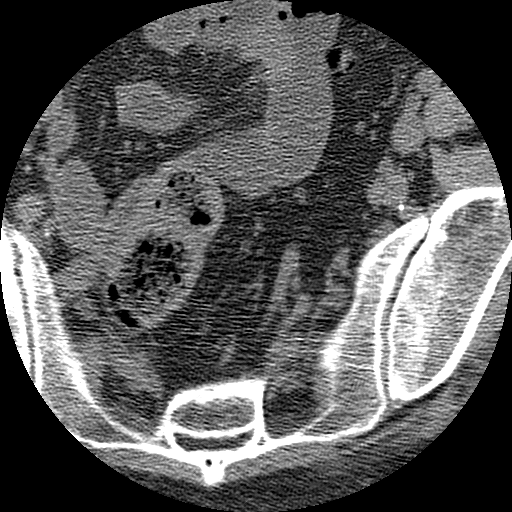
[im 1/91  bone]
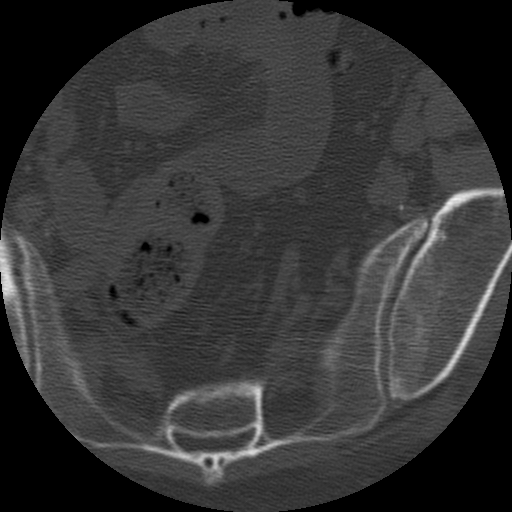
[im 91/91  bone]
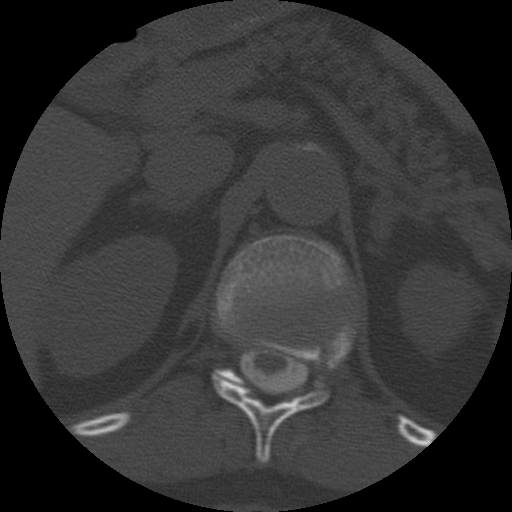

[Series 401: sag c. spine · sagittal · 0.37mm/px · 2 of 31 slices shown]
[im 11/31  bone]
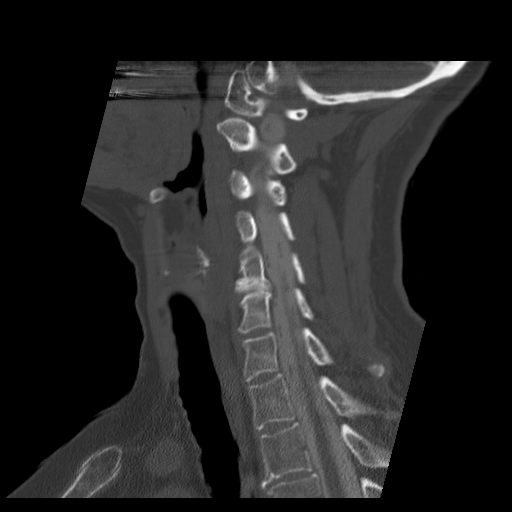
[im 21/31  bone]
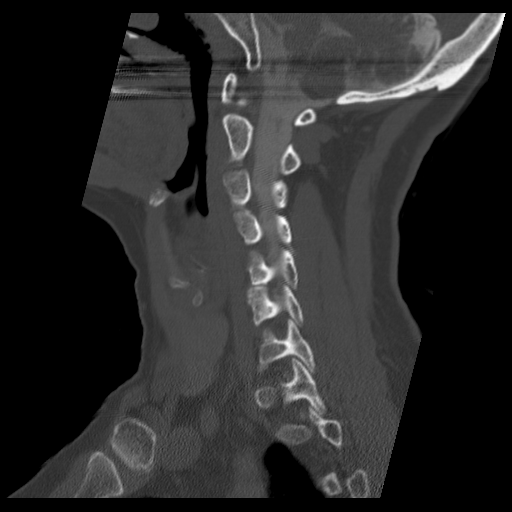

[Series 700: cor l. spine · coronal · 0.45mm/px · 3 of 46 slices shown]
[im 10/46  bone]
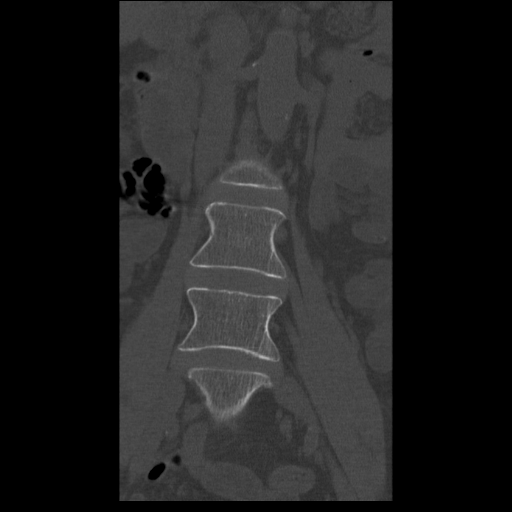
[im 19/46  bone]
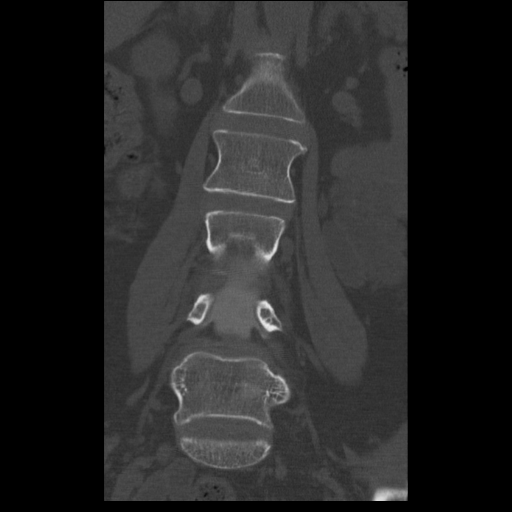
[im 28/46  bone]
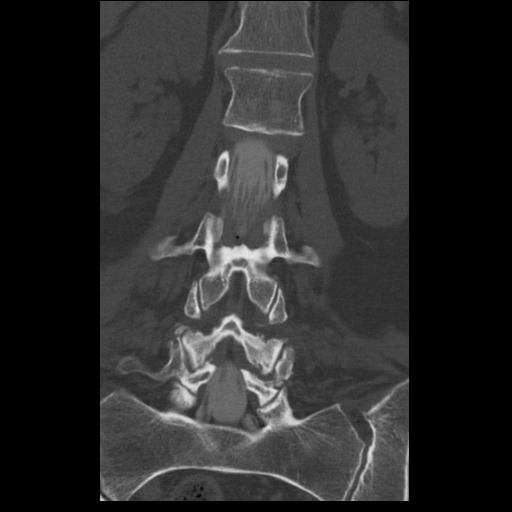

[7 of 33 positions shown; findings below may reference images not displayed]

IMPRESSION: Successful injection of  intrathecal contrast for myelography.

MYELOGRAM CERVICAL AND LUMBAR
FINDINGS: Left lateral recess narrowing is present at L4-5 with
medial displacement of the left L5 nerve root.  The left L5 nerve
root is foreshortened.  There is slight medial deviation of the S1
nerve root.  The L4 nerve roots fill normally on both sides.  The
lateral image demonstrates anterolisthesis at L4-5 which appears
slightly worse than on the prior MRI.  Upright, flexion, and
extension images were not performed as the study is both the
cervical and lumbar myelogram.  Separate flexion and extension
views may be of use to evaluate the stability of the L4-5 level.
Slight disc bulging is noted at L1-2 without significant stenosis.

The nerve roots of the cervical spine fill on both sides.  The
lateral image demonstrates a disc osteophyte complex at the C5-6
level with significant ventral impression.  There may be slight
retrolisthesis as well.
IMPRESSION: 1.  Left lateral recess narrowing at L4-5 with significant impact
on the left L5 nerve root.
2.  A large disc osteophyte complex is present at to C5-6.  The
nerve roots appear to fill on both sides.
3.  Shallow disc bulge at L1-2 without definite stenosis.

CT MYELOGRAPHY CERVICAL SPINE
FINDINGS: Cervical spine is imaged from skull base through T2.
Craniocervical junction is within normal limits.  The vertebral
body heights maintained.  Slight degenerative anterolisthesis is
present at C3-4.  There is some straightening of the normal
cervical lordosis.

Mildly prominent left-sided level II cervical lymph nodes remain
ovoid in shape and are likely benign.  The soft tissues are
otherwise unremarkable. The individual disc levels are as follows.

C1-2:  Negative.

C2-3 negative.

C3-4:  Mild facet hypertrophy is evident.  Slight anterolisthesis
is noted without significant stenosis.

C4-5:  A shallow central disc protrusion is present.  No focal
stenosis is evident.

C5-6:  A broad-based disc osteophyte complex is present.
Uncovertebral spurring is more prominent on the right.  Mild
foraminal narrowing is present bilaterally, worse on the right.
Mild central canal stenosis is present with effacement of the
ventral CSF.

C6-7:  No significant stenosis is present.

C7-T1:  Negative.
IMPRESSION: 1.  A broad-based disc osteophyte complexes present at C5-6 with
mild foraminal narrowing, worse on the right.
2.  Mild central canal stenosis present at C5-6.
3.  Mild facet hypertrophy and grade 1 anterolisthesis at C3-4
without significant stenosis.

CT MYELOGRAPHY LUMBAR SPINE
FINDINGS: The tip of the conus medullaris is at L1-2, within
normal limits.  The lumbar spine is imaged from midbody of T12
through the S2-3 level.  The vertebral body heights are maintained.
Grade 1 anterolisthesis at to L4-5 measures 3.5 mm.  This is
similar to the prior MRI.  Alignment is otherwise anatomic.
Atherosclerotic calcifications are noted in the abdominal aorta
without aneurysm.  Limited imaging the abdomen is otherwise
unremarkable.  Individual disc levels are as follows.

The disc levels at L3-4 above are normal.

L4-5:  Moderate facet hypertrophy is present.  Broad-based disc
bulging is worse on the left.  This results in moderate to left
foraminal stenosis.  Ligamentum flavum thickening contributes.
Mild foraminal narrowing is worse on the left.

L5-S1:  No significant stenosis is present.
IMPRESSION: 1.  Moderate left lateral recess narrowing at L4-5 secondary to
asymmetric disc bulging, facet hypertrophy, and prominent
ligamentum flavum thickening.
2.  Mild foraminal narrowing bilaterally at L4-5, worse on the
left.

## 2010-11-22 IMAGING — RF IR MYELOGRAM [PERSON_NAME]
12 of 24 series · 12 of 24 positions shown · IV contrast (omnipaque)
Comparison: MRI of the lumbar spine 12/17/2007.  There are no
comparisons for the cervical myelogram.

CLINICAL DATA: Low back pain extending into the left lower
extremity.  Neck pain.  Bilateral arm and hand numbness.

MYELOGRAM INJECTION
TECHNIQUE: Informed consent was obtained from the patient prior to
the procedure, including potential complications of headache,
allergy, infection and pain.  A timeout procedure was performed.
With the patient prone, the lower back was prepped with Betadine.
1% Lidocaine was used for local anesthesia.  Lumbar puncture was
performed at the right paramidline L2-3 level using a 22 gauge
needle with return of clear CSF.  10 ml of Omnipaque 322was
injected into the subarachnoid space .
TECHNIQUE: Following injection of intrathecal Omnipaque contrast,
spine imaging in multiple projections was performed using
fluoroscopy.
Fluoroscopy Time: 1.5 minutes.
TECHNIQUE: CT imaging of the cervical spine was performed after
intrathecal contrast administration. Multiplanar CT image
reconstructions were also generated.
TECHNIQUE: CT imaging of the lumbar spine was performed after
intrathecal contrast administration.  Multiplanar CT image

[Series 2: myelogram  white · 1 of 1 slices shown (1 of 12)]
[im 1/1]
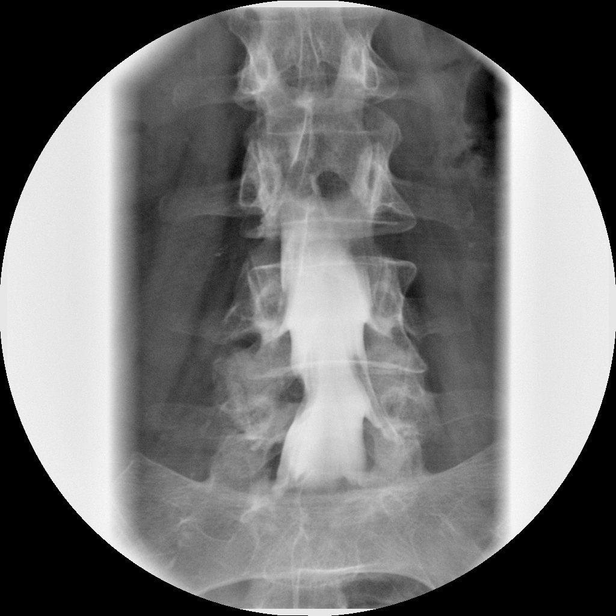

[Series 4: myelogram  white · 1 of 1 slices shown (2 of 12)]
[im 1/1]
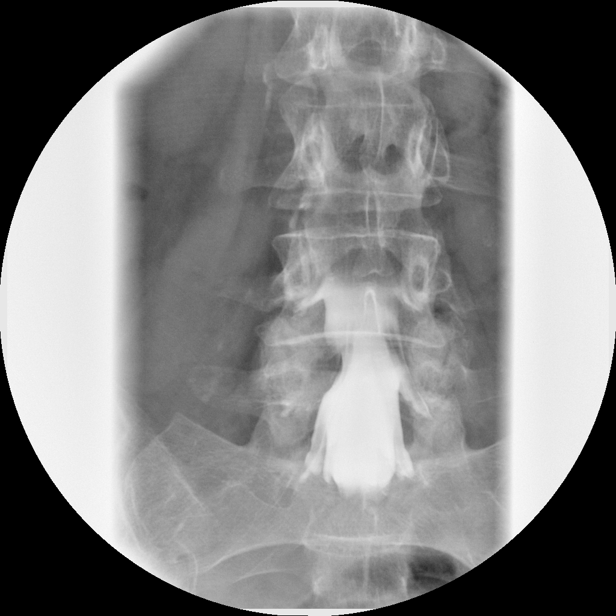

[Series 6: myelogram  white · 1 of 1 slices shown (3 of 12)]
[im 1/1]
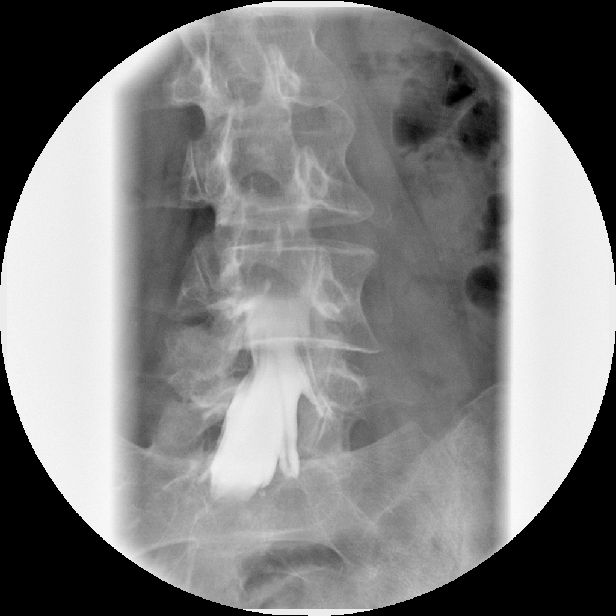

[Series 8: myelogram  white · 1 of 1 slices shown (4 of 12)]
[im 1/1]
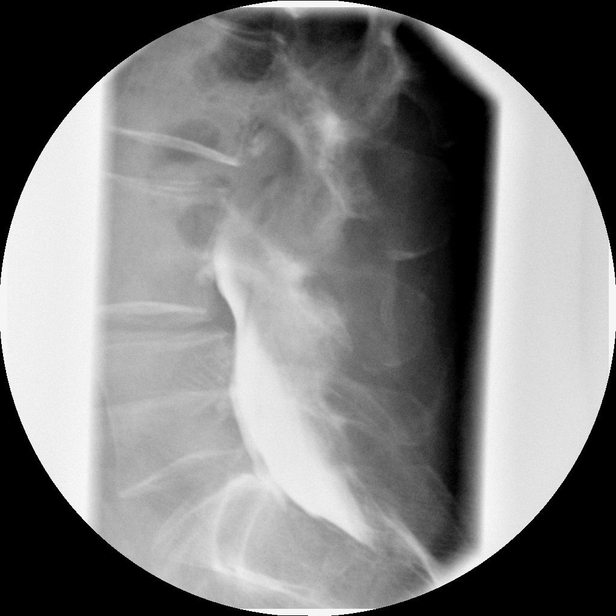

[Series 10: myelogram  white · 1 of 1 slices shown (5 of 12)]
[im 1/1]
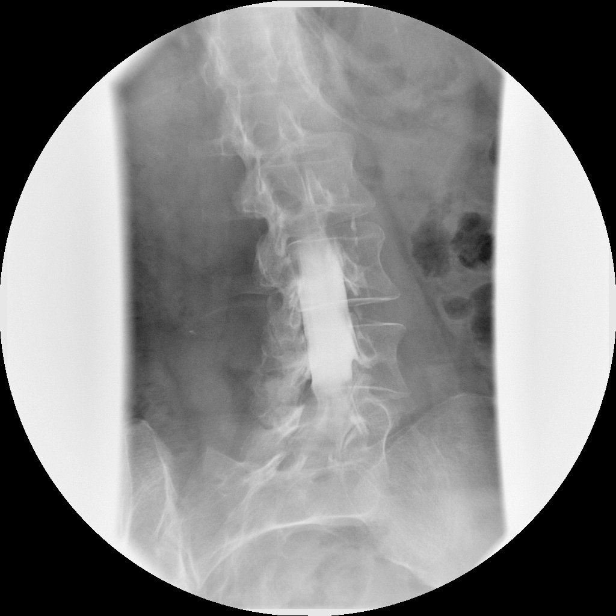

[Series 12: myelogram  white · 1 of 1 slices shown (6 of 12)]
[im 1/1]
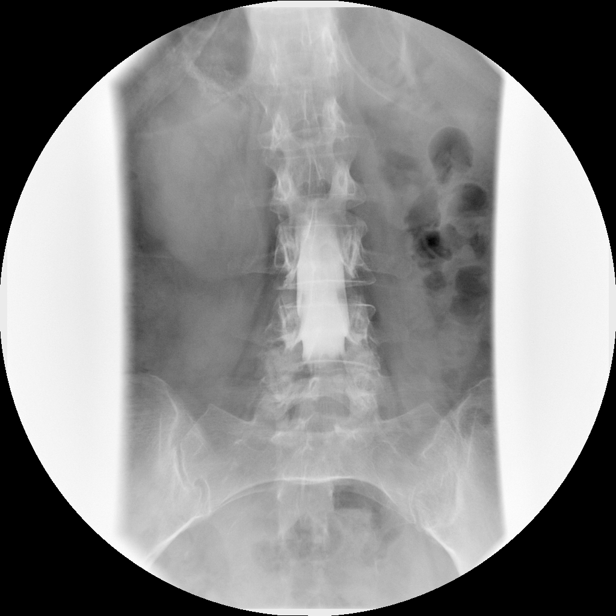

[Series 14: myelogram  white · 1 of 1 slices shown (7 of 12)]
[im 1/1]
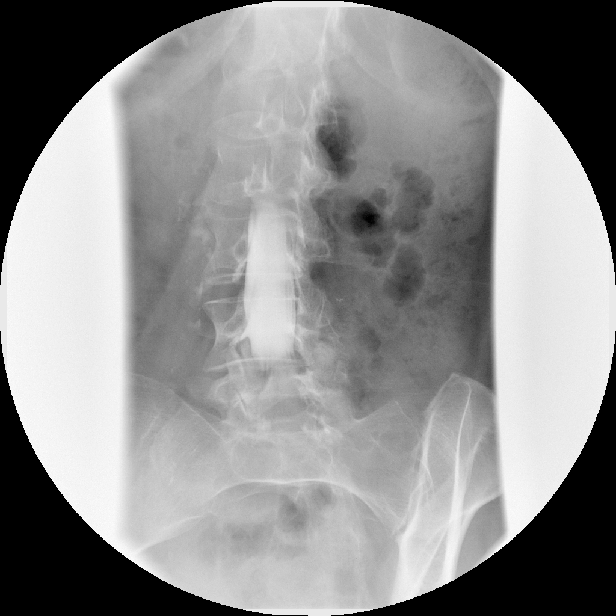

[Series 16: myelogram  white · 1 of 1 slices shown (8 of 12)]
[im 1/1]
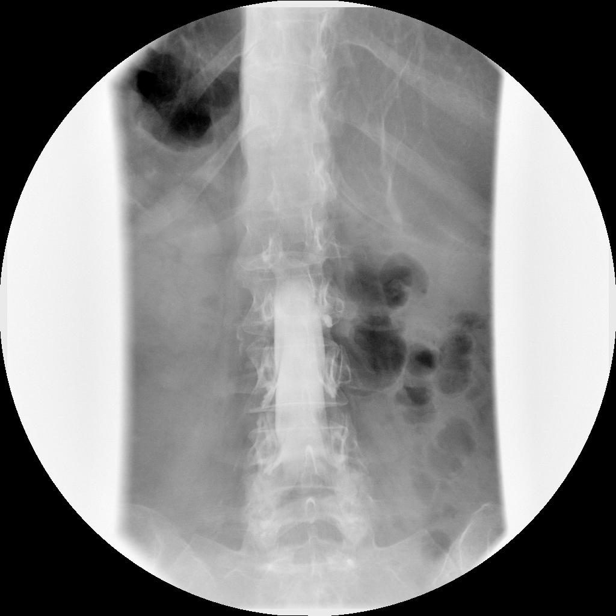

[Series 18: myelogram  white · 1 of 1 slices shown (9 of 12)]
[im 1/1]
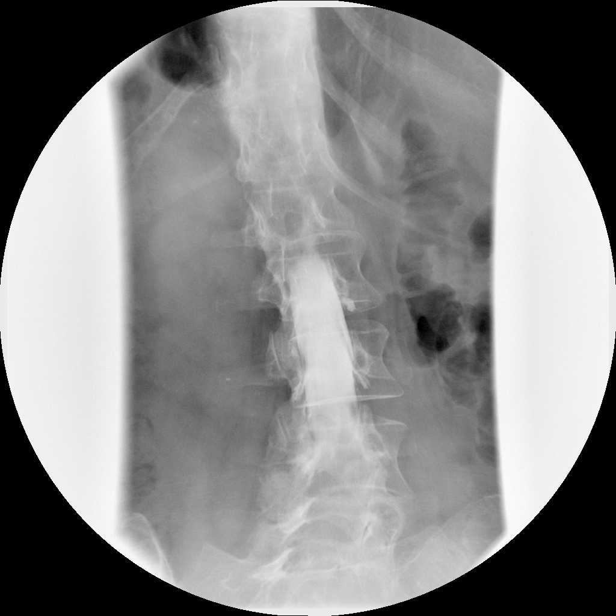

[Series 20: myelogram  white · 1 of 1 slices shown (10 of 12)]
[im 1/1]
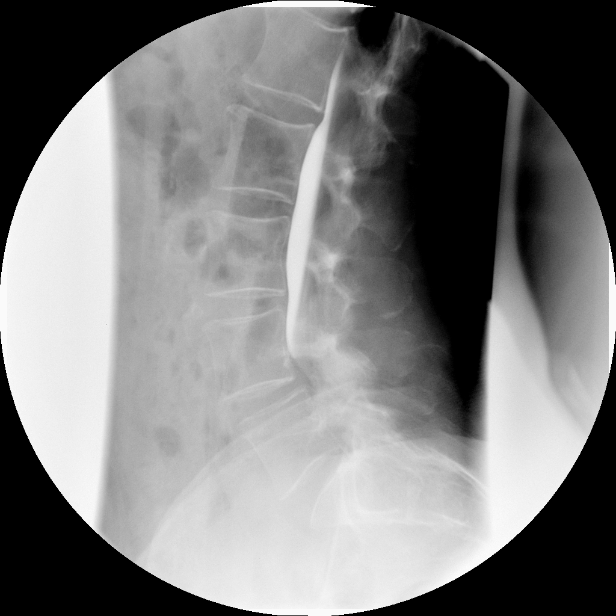

[Series 22: myelogram  white · 1 of 1 slices shown (11 of 12)]
[im 1/1]
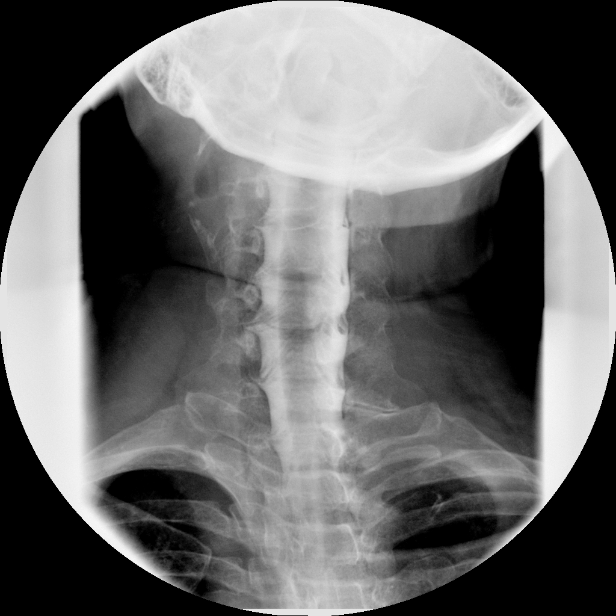

[Series 24: myelogram  white · 1 of 1 slices shown (12 of 12)]
[im 1/1]
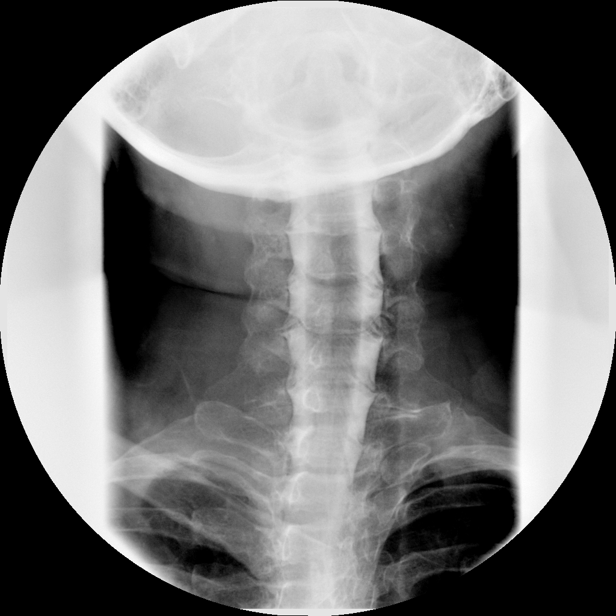

[12 of 24 positions shown; findings below may reference images not displayed]

IMPRESSION: Successful injection of  intrathecal contrast for myelography.

MYELOGRAM CERVICAL AND LUMBAR
FINDINGS: Left lateral recess narrowing is present at L4-5 with
medial displacement of the left L5 nerve root.  The left L5 nerve
root is foreshortened.  There is slight medial deviation of the S1
nerve root.  The L4 nerve roots fill normally on both sides.  The
lateral image demonstrates anterolisthesis at L4-5 which appears
slightly worse than on the prior MRI.  Upright, flexion, and
extension images were not performed as the study is both the
cervical and lumbar myelogram.  Separate flexion and extension
views may be of use to evaluate the stability of the L4-5 level.
Slight disc bulging is noted at L1-2 without significant stenosis.

The nerve roots of the cervical spine fill on both sides.  The
lateral image demonstrates a disc osteophyte complex at the C5-6
level with significant ventral impression.  There may be slight
retrolisthesis as well.
IMPRESSION: 1.  Left lateral recess narrowing at L4-5 with significant impact
on the left L5 nerve root.
2.  A large disc osteophyte complex is present at to C5-6.  The
nerve roots appear to fill on both sides.
3.  Shallow disc bulge at L1-2 without definite stenosis.

CT MYELOGRAPHY CERVICAL SPINE
FINDINGS: Cervical spine is imaged from skull base through T2.
Craniocervical junction is within normal limits.  The vertebral
body heights maintained.  Slight degenerative anterolisthesis is
present at C3-4.  There is some straightening of the normal
cervical lordosis.

Mildly prominent left-sided level II cervical lymph nodes remain
ovoid in shape and are likely benign.  The soft tissues are
otherwise unremarkable. The individual disc levels are as follows.

C1-2:  Negative.

C2-3 negative.

C3-4:  Mild facet hypertrophy is evident.  Slight anterolisthesis
is noted without significant stenosis.

C4-5:  A shallow central disc protrusion is present.  No focal
stenosis is evident.

C5-6:  A broad-based disc osteophyte complex is present.
Uncovertebral spurring is more prominent on the right.  Mild
foraminal narrowing is present bilaterally, worse on the right.
Mild central canal stenosis is present with effacement of the
ventral CSF.

C6-7:  No significant stenosis is present.

C7-T1:  Negative.
IMPRESSION: 1.  A broad-based disc osteophyte complexes present at C5-6 with
mild foraminal narrowing, worse on the right.
2.  Mild central canal stenosis present at C5-6.
3.  Mild facet hypertrophy and grade 1 anterolisthesis at C3-4
without significant stenosis.

CT MYELOGRAPHY LUMBAR SPINE
FINDINGS: The tip of the conus medullaris is at L1-2, within
normal limits.  The lumbar spine is imaged from midbody of T12
through the S2-3 level.  The vertebral body heights are maintained.
Grade 1 anterolisthesis at to L4-5 measures 3.5 mm.  This is
similar to the prior MRI.  Alignment is otherwise anatomic.
Atherosclerotic calcifications are noted in the abdominal aorta
without aneurysm.  Limited imaging the abdomen is otherwise
unremarkable.  Individual disc levels are as follows.

The disc levels at L3-4 above are normal.

L4-5:  Moderate facet hypertrophy is present.  Broad-based disc
bulging is worse on the left.  This results in moderate to left
foraminal stenosis.  Ligamentum flavum thickening contributes.
Mild foraminal narrowing is worse on the left.

L5-S1:  No significant stenosis is present.
IMPRESSION: 1.  Moderate left lateral recess narrowing at L4-5 secondary to
asymmetric disc bulging, facet hypertrophy, and prominent
ligamentum flavum thickening.
2.  Mild foraminal narrowing bilaterally at L4-5, worse on the
left.

## 2011-01-03 LAB — COMPREHENSIVE METABOLIC PANEL
ALT: 10 U/L (ref 0–35)
AST: 23 U/L (ref 0–37)
Calcium: 8.5 mg/dL (ref 8.4–10.5)
GFR calc Af Amer: >60 mL/min (ref >60)
Potassium: 2.9 mEq/L — ABNORMAL LOW (ref 3.5–5.1)
Sodium: 133 mEq/L — ABNORMAL LOW (ref 135–145)
Total Protein: 6.3 g/dL (ref 6.0–8.3)

## 2011-01-03 LAB — URINE MICROSCOPIC-ADD ON

## 2011-01-03 LAB — DIFFERENTIAL
Eosinophils Absolute: 0 10*3/uL (ref 0.0–0.7)
Eosinophils Relative: 0 % (ref 0–5)
Lymphs Abs: 2.6 10*3/uL (ref 0.7–4.0)
Monocytes Absolute: 1.2 10*3/uL — ABNORMAL HIGH (ref 0.1–1.0)
Monocytes Relative: 8 % (ref 3–12)

## 2011-01-03 LAB — CBC
Hemoglobin: 14.5 g/dL (ref 12.0–15.0)
MCHC: 35 g/dL (ref 30.0–36.0)
Platelets: 328 10*3/uL (ref 150–400)
RDW: 12.8 % (ref 11.5–15.5)
RDW: 12.8 % (ref 11.5–15.5)
WBC: 13.8 10*3/uL — ABNORMAL HIGH (ref 4.0–10.5)
WBC: 15.3 10*3/uL — ABNORMAL HIGH (ref 4.0–10.5)

## 2011-01-03 LAB — URINALYSIS, ROUTINE W REFLEX MICROSCOPIC
Bilirubin Urine: NEGATIVE
Glucose, UA: NEGATIVE mg/dL
Ketones, ur: 40 mg/dL — AB
Protein, ur: NEGATIVE mg/dL
Urobilinogen, UA: 1 mg/dL (ref 0.0–1.0)
pH: 7 (ref 5.0–8.0)

## 2011-01-03 LAB — BASIC METABOLIC PANEL
BUN: 8 mg/dL (ref 6–23)
Calcium: 8.8 mg/dL (ref 8.4–10.5)
Creatinine, Ser: 0.79 mg/dL (ref 0.4–1.2)
GFR calc non Af Amer: >60 mL/min (ref >60)
Potassium: 3 mEq/L — ABNORMAL LOW (ref 3.5–5.1)

## 2011-01-03 LAB — URINE CULTURE: Culture: NO GROWTH

## 2011-03-02 NOTE — Assessment & Plan Note (Signed)
[facility]                               LIPID CLINIC NOTE   ALAIYA, MARTINDELCAMPO                    MRN:          [id number]  DATE:09/07/2007                            DOB:          1956/03/14    REFERRING PHYSICIAN:  Tinnie Gens A. Tawanna Cooler, MD   Ms. Lohnes comes in today for her initial visit to the lipid clinic.  She was referred to [place] by Dr. Tawanna Cooler. She was also seen by a  gastroenterologist, Dr. Leone Payor.   PAST MEDICAL HISTORY:  History of antibiotic-induced colitis, irritable  bowel syndrome and migraine headaches.   CURRENT MEDICATIONS:  Tenoretic, lisinopril and aspirin with multiple  p.r.n. medications which include Levsin, Lomotil, Librax, Phenergan,  Reglan, Vicodin. She also has some inhaled medications that she does not  use on a regular basis which include Flonase, Qvar and Ventolin.   MEDICATION ALLERGIES:  Her drug allergies and intolerances are quite  extensive and include almost all ANTIBIOTICS, PROPOFOL, STEROIDS,  BARIUM, NSAIDS and TYLENOL. She is also very intolerant to certain types  of food, especially dairy products. Most of these drug intolerances are  due to severe diarrhea with almost any oral medication. She does  continue to smoke cigarettes and has recently failed a trial of Chantix  which caused depression, vivid dreams and a feeling of swollen brain.   FAMILY HISTORY:  Significant for MI in her father at the age of 63.   PHYSICAL EXAMINATION:  Weight of 151 pounds, blood pressure 130/80,  heart rate 76.   LABORATORY DATA:  Total cholesterol 296, triglycerides 250, HDL 34.8,  LDL 224.3, liver function tests are within normal limits. TSH is within  normal limits. Fasting glucose was 116.   ASSESSMENT:  Ms. Vantol has elevated triglycerides greater than goal of  less than 150. HDL is lower than the goal of greater than 40. LDL is  above the goal of less than 100. She has significant problems with her  GI tract with  multiple medication intolerances. She reports the only  medication that she knows of for cholesterol lowering that she has tried  in the past was Lipitor and it caused diarrhea. Ms. Lasky diet is  severely restricted by her irritable bowel syndrome. She does not get  much scheduled exercise.   PLAN:  Start with a very low dose Crestor 2.5 mg daily and then  increase, if tolerated, to 5 mg per day. I instructed Ms. Fritsch to  divide the dose or crush the tablet, whatever she thinks will help her  tolerate it. I asked her to give [place] a call if any problems or concerns  so that we can adjust our strategy if she does not tolerate the Crestor.  I gave her samples to cover her for the next 3-4 weeks and asked her to  call if she needs more samples or a prescription. I asked her to try to  get some exercise on a regular basis because it is always a good idea.  Followup with [place] in 6 weeks and will check a lipid and liver panel and  make any adjustments at that time. It may be  very difficult to find a cholesterol-lowering strategy in this patient.  I wonder if her severe irritable bowel syndrome is related to her  hyperlipidemia in any way that we may not understand.      Charolotte Eke, PharmD  Electronically Signed      Rollene Rotunda, MD, [facility]  Electronically Signed   TP/MedQ  DD: 09/07/2007  DT: 09/08/2007  Job #: [id number]

## 2011-03-05 NOTE — H&P (Signed)
[name], [name]             ACCOUNT NO.:  [id number]   MEDICAL RECORD NO.:  [id number]          PATIENT TYPE:  INP   LOCATION:  1426                         FACILITY:  [facility]   PHYSICIAN:  Tinnie Gens A. Tawanna Cooler, M.D. [facility] OF BIRTH:  12-Jun-1956   DATE OF ADMISSION:  12/14/2005  DATE OF DISCHARGE:                                HISTORY & PHYSICAL   PRIMARY CARE PHYSICIAN:  Dr. Alonza Smoker   HOSPITALIST:  Dr. Rene Paci   GASTROENTEROLOGY CONSULT:  Dr. Claudette Head   [name] is a 55 year old married female gravida 2, para 2, AB 0 who  comes into the office for evaluation of abdominal pain.   The patient states she felt well until about a week ago when she began  having left upper quadrant pain. This was mild and then about 48 hours ago  the pain became intense, spread throughout her abdomen, and was associated  with violent diarrhea in the last 12 hours. She has also had some dry heaves  but that was last week and then that abated. She denies any fever, chills,  or dysuria. The pain was so bad on a scale 1-10 it was a 10. She did not  sleep last night and came to the office for evaluation.   The patient had a similar problem like this approximately a year ago. At  that time she had a GI workup by Dr. Leone Payor. Studies were done which were  negative and her symptoms abated over time. She says she has been symptom  free since that time. She states with all the anorexia she has lost some  weight. Her last recorded weight November 2006 was 162.5; she is down to  157.   The patient was seen in the office for evaluation and because of her pain  was admitted to the hospital for further evaluation and therapy.   PAST MEDICAL HISTORY:  She was hospitalized for colitis following a round  of antibiotics. She also had a TAH and BSO for nonmalignant reasons. Prior  to that, childbirth x2. A ganglion cyst removed from her right wrist. Past  illnesses:  None. Injuries:  None.   DRUG ALLERGIES:  CODEINE causes nausea and CNS side effects. She is also  sensitive to STATINS - they cause her nausea and diarrhea, and she states  she is also allergic to CONTRAST DYE.   CURRENT MEDICATIONS:  1.  Lisinopril 20 mg daily for hypertension.  2.  Tenoretic 50/25 daily for hypertension.  3.  Premarin 0.3 she takes a pill about twice a month.   The combination of lisinopril and Tenoretic have kept her blood pressure  normal. However, today it is 164/100 but she is in severe pain. She  continues to smoke, five to six cigarettes per day. She denies any alcohol  or drug use.   REVIEW OF SYSTEMS:  She has had history of migraine headaches but as she has  gotten older they have abated. She sees a physician in [place] for yearly  eye exams. She gets regular dental care. CARDIOPULMONARY:  Negative. GI:  Negative except for noted  above. GU:  Negative. GYN:  Gravida 2, para 2, AB  0. She has had a TAH and BSO. She does not do BSE on a regular basis and  declines screening mammography. ENDO:  Negative. Weight steady except for  noted above. MUSCULOSKELETAL:  Negative. VASCULAR:  Negative. She does have  some allergic rhinitis. Otherwise, rest of review of systems negative.   SOCIAL HISTORY:  She is married. She has two children. [place]   FAMILY HISTORY:  Dad died in his 54s, a smoker, coronary artery disease and  had some type of malignancy - she is not sure what type. Mother is in her  early 54s with thyroid disease, hypertension, and eye problems. Two brothers  - one died in [place], one has tropical sprue. No sisters. Last tetanus was  1999.   The patient has had lower vascular Dopplers September 2002. At that time I  sent her because of decreasing pulses in her feet. That was done at the  vascular office and reported to be normal. However, with the decrease in  blood flow to her lower extremities that always brings up the concern about   ischemic colitis as a cause of her abdominal pain.   PHYSICAL EXAMINATION:  VITAL SIGNS:  Weight was 157, temperature was 98,  pulse 70 and regular, respirations 12 per minute, blood pressure 164/100.  GENERAL:  She was a well-developed female with severe abdominal pain.  HEENT:  Negative.  NECK:  Supple, thyroid was not enlarged.  CHEST:  Clear to auscultation.  CARDIAC:  Negative.  BREAST:  Negative.  ABDOMEN:  Negative except for it is extremely bloated. She has diffuse  tenderness and no rebound. In fact, it felt better when I let go. I can  appreciate no masses.  RECTAL:  Rectum is normal, stool was guaiac negative.  EXTREMITIES:  Normal.  SKIN:  Normal.  PERIPHERAL PULSES:  Normal except for peripheral pulses dorsalis pedis and  posterior tibial in the lower extremities are 2/4 secondary to chronic  tobacco abuse.   IMPRESSION:  1.  Acute abdominal pain. Plan:  Admit, further diagnostic evaluation. Dr.      Russella Dar called for consultation.  2.  History of hypertension.  3.  Tobacco abuse.  4.  Hyperlipidemia.  5.  Peripheral vascular disease.  6.  Asthma.  7.  History of migraine headaches.  8.  History of antibiotic-induced colitis.  9.  Status post total abdominal hysterectomy and bilateral salpingo-      oophorectomy.  10. Status post childbirth x2.  11. Status post ganglion surgery right wrist.   The patient will be admitted to [facility] as noted above. Diagnostic  workup will ensue.           ______________________________  Eugenio Hoes. Tawanna Cooler, M.D. [facility]     JAT/MEDQ  D:  12/14/2005  T:  12/14/2005  Job:  [id number]

## 2011-03-05 NOTE — Assessment & Plan Note (Signed)
[facility]                           GASTROENTEROLOGY OFFICE NOTE   NAME:[name], [name]                    MRN:          [id number]  DATE:06/27/2006                            DOB:          07-Jan-1956    CHIEF COMPLAINT:  Follow-up after colonoscopy.   HISTORY:  [name] had her colonoscopy under Propofol sedation on June 02, 2006.  Random biopsies were taken.  There was a very strong IBS response  even with Propofol.  The colon biopsies were entirely normal.  Within the  next few hours after that she developed a high fever, she had some abdominal  pain.  X-ray studies were unrevealing.  The fever was up to 104.  She had  had some cough and congestion, and it was thought perhaps maybe she had some  bronchitis.  The fever did resolve.  She went back to Dr. Tawanna Cooler and he  thought maybe she had diverticulitis and put her on some different  antibiotics, which she was discharged on.  At this point she is actually  doing reasonably well using Librax p.r.n. before meals.  It turns out that  she was under quite a bit of stress as her son was up on second degree  drape charge and, fortunately, these were knocked down.  Breathing is  better.  There is a mild cough at this point.  She does have diffuse  diverticulosis of the colon.   Medications listed and reviewed in the chart.   Past medical history reviewed and unchanged otherwise.   Note that during her hospitalization there was no clear cause of the fever.  See note of June 03, 2006 (discharge summary).   PHYSICAL EXAMINATION:  GENERAL:  A well-developed, well-nourished woman in  no acute distress.  VITAL SIGNS:  Weight 153 pounds, pulse 116, blood pressure 142/84, height 5  feet 5 inches.   ASSESSMENT:  1. 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  She had severe antibiotic-associated      colitis/Clostridium difficile colitis a number of  years ago.  2. Fever after her colonoscopy.  Subsequent to that I came across some      reports of febrile responses to Propofol, and I think that is what she      had.  3. Overall she is significantly improved at this time.  She understands      she will have recurrent symptoms.   PLAN:  1. Continue Librax as needed.  2. Continue with counseling, which she has done for years as far as      anxiety.  3. I will report the Propofol incident to pharmacy as a medication      reaction and add this as a reaction to her drug allergies and adverse      reaction list.  I really think that must have been what happened in      this case.   ADDENDUM:  Note she is on OTC Advantage, which is a probiotic, that is fine.  We tried Florastor, which did not seem to  help her (that is a probiotic  yeast).  If the OTC Advantage is not persistently helpful, she could try  Align.  I will see her back as needed.  At this point she should have every  10 years screening colonoscopy, sooner if signs or symptoms warrant.                                   Iva Boop, MD,FACG   CEG/MedQ  DD:  06/27/2006  DT:  06/28/2006  Job #:  [id number]   cc:   Tinnie Gens A. Tawanna Cooler, MD

## 2011-03-05 NOTE — Assessment & Plan Note (Signed)
[facility]                         GASTROENTEROLOGY OFFICE NOTE   NAME:[name], [name]                    MRN:          [id number]  DATE:10/05/2006                            DOB:          09/25/1956    CHIEF COMPLAINT:  Epigastric pain.   [name] had a bad spell of epigastric pain.  She is having some  dysphagia with potatoes and rice.  Sometimes pills.  She has a lower  sternal sticking point.  The epigastric pain radiates into the back.  She has had HIDA scans, ultrasounds, CT scanning in the past that have  not shown any gallbladder problems.  This time the symptoms started  after she ate shortbread cookies.  This was a problem in the past when  she was pregnant and she had sworn off those, but she decided to try  them again.  She has a known fried food intolerance.  She avoids that.  When the pain came, she was short of breath.  She did not vomit.  She  has had cardiac workups in the past, and she has had recurrent problems  like this.  In 1999, she had an upper GI endoscopy that demonstrated a  ring-like stricture and a small hiatal hernia.  She had grade A  esophagitis, she was dilated to 48 [place] at this time.  She says her  bowels are as good as they have been in 16 years, and otherwise she is  feeling well.  She is going to try to quit smoking after the first of  the year, and is encouraged to do so.   MEDICATIONS:  1. Lisinopril 20 mg b.i.d.  2. Atenolol/chlorthalidone 100/25 a half each day.  3. Protonix 40 mg daily.  4. Levsinex 0.375 mg daily.  5. Over-the-counter Advantage Probiotic.  6. Librax p.r.n.   DRUG ALLERGIES:  PROPOFOL CAUSED A FEBRILE REACTION.  SHE IS SENSITIVE  TO ASPIRIN AND TYLENOL.  SHE CAN TAKE 1 ADVIL FOR A HEADACHE, BUT  NOTHING MORE, IT CAUSES DIARRHEA.  SHE HAS PROBLEMS WITH PERCOCET AND  PERCODAN, AND A SENSITIVITY TO BARIUM AND PENICILLIN.   REVIEW OF SYSTEMS:  As above.  There has been some  weight loss over  time, but overall steady according to my charts.  Emotionally, she feels  well at this time.   PHYSICAL EXAMINATION:  Reveals a well-developed, well-nourished white  woman in no acute distress.  Weight 153 pounds, pulse 68, blood pressure  118/72.  The eyes are anicteric.  LUNGS:  Clear.  HEART:  S1, S2, no murmurs or gallops.  CHEST WALL:  Tender in the lower portion and over the xiphoid (she does  have chronic soreness there).  ABDOMEN:  Soft with some epigastric tenderness that disappears with  abdominal wall tension.  Bowel sounds are present.  She is alert and oriented times 3.   ASSESSMENT:  1. Recurrent epigastric pain, some dysphagia, with a history of an      esophageal ring and hiatal hernia and gastroesophageal reflux      disease.  This is episodic.  Differential includes esophageal  spasm;  gallbladder, though seems very unlikely given previous      workups; cardiac, again, seems very unlikely given the chronic      history and negative workups in the past; peptic ulcer disease,      unlikely on Protonix; perhaps persistent reflux problems,      plus/minus esophageal spasm.  2. Chronic chest wall pain, consistent with costochondritis, she is      tender in the lower sternal area and the xiphoid.  3. Underlying irritable bowel syndrome with diarrhea predominance,      under good control at this time.   PLAN:  1. Schedule upper GI endoscopy and possible esophageal dilation.  2. Continue current medications.  3. Add a topical salicylate cream to her chest wall to see if that      helps this chronic costochondritis problem.  As she says, Tylenol      and NSAIDs are disruptive to her GI tract.  4. Further plans pending the above.  May need barium swallow/upper GI      looking for spasm, esophageal manometry, Bravo pH testing could be      indicated.  She has been on the Protonix for less than a year, so I      do not know that that effect is  waning, usually PPIs wear off after      several years, and so I would not switch that at this current time.     Iva Boop, MD,FACG  Electronically Signed    CEG/MedQ  DD: 10/05/2006  DT: 10/05/2006  Job #: [id number]   cc:   Tinnie Gens A. Tawanna Cooler, MD

## 2011-03-05 NOTE — Discharge Summary (Signed)
[name], [name]             ACCOUNT NO.:  [id number]   MEDICAL RECORD NO.:  [id number]          PATIENT TYPE:  INP   LOCATION:  1426                         FACILITY:  [facility]   PHYSICIAN:  Rene Paci, M.D. LHCDATE OF BIRTH:  1955-12-30   DATE OF ADMISSION:  12/14/2005  DATE OF DISCHARGE:  12/16/2005                                 DISCHARGE SUMMARY   DISCHARGE DIAGNOSES:  1.  Acute diarrhea with abdominal pain, likely infectious gastroenteritis,      symptoms resolved.  2.  Chronic irritable bowel syndrome.  3.  Hypertension.  4.  Tobacco abuse.  5.  History of dyslipidemia not currently on med treatment, outpatient      follow-up.   DISCHARGE MEDICATIONS:  1.  Levsin 0.375 p.o. b.i.d. to continue as outpatient.  2.  Protonix 40 mg p.o. q. d.  3.  Phenergan 25 mg p.o. q.4 h p.r.n.  4.  Percocet 5/325, 1-2 p.o. q. 6 hours p.r.n. severe pain.   Other medications are as prior to admission without change and include:  1.  Tenoretic 50/25 1 p.o. q.a.m.  2.  Lisinopril 20 mg p.o. q.d.  3.  Premarin 0.3 mg p.o. twice monthly as before.   DISPOSITION:  The patient is discharged home. Hospital follow-up will be  arranged by patient with her primary MD, Dr. Alonza Smoker and GI physician, Dr.  Stan Head on an as-needed basis or as previously instructed.   CONDITION ON DISCHARGE:  Medically improved tolerating p.o., hemodynamically  stable throughout hospitalization.   HOSPITAL COURSE BY PROBLEM:  #1. DIARRHEA ILLNESS. The patient is a pleasant  55 year old woman with chronic IBS and abdominal pain who experienced onset  of diarrhea prior to admission. Please see dictation H&P for full details.  Because of unbearable pain and concerns of dehydration, she was admitted for  further evaluation. A GI consult was obtained as she regularly sees Dr.  Leone Payor for her IBS. Plain films were negative for small bowel obstruction,  for acute changes and it was felt that she likely has  infectious  gastroenteritis exacerbating her chronic IBS symptoms. She was treated  symptomatically with IV fluids and bowel rest for the initial night. Also  begun on Levsin 0.375 b.i.d. by GI and Protonix. Overnight, she had no  diarrhea and she was begun on a clear liquid diet which she advanced without  difficulty. Pain medication was able to be changed from IV morphine to p.o.  Percocet as well as p.o. Phenergan as needed and she has tolerated this well  and is anxious for discharge home.  Medically stable to do so at this time. Outpatient follow-up as needed.  #2.  OTHER MEDICAL ISSUES. The patient's other medical issues are as listed  above, no other changes were made to her regimen except as described.      Rene Paci, M.D. [facility]  Electronically Signed     VL/MEDQ  D:  12/16/2005  T:  12/17/2005  Job:  [id number]

## 2011-03-05 NOTE — Op Note (Signed)
NAME:  [name], [name]             ACCOUNT NO.:  [id number]   MEDICAL RECORD NO.:  [id number]          PATIENT TYPE:  AMB   LOCATION:  SDS                          FACILITY:  [facility]   PHYSICIAN:  Dionne Ano. 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   PROCEDURES:  1. Left thumb carpometacarpal arthroplasty with Artelon spacer      implant.  2. Left first dorsal compartment release with decompression of the      abductor pollicis longus and extensor pollicis brevis tendons.  3. Abductor pollicis longus digastric portion tendon transfer to the      extensor carpi radialis longus and abductor pollicis longus proper      for stabilization of the carpometacarpal joint.  4. Stress radiography.   SURGEON:  Dionne Ano. Amanda Pea, M.D.   ASSISTANT:  None.   COMPLICATIONS:  None.   ANESTHESIA:  General.   TOURNIQUET TIME:  Less than an hour.   INDICATIONS FOR PROCEDURE:  Ms. Holten is a 55 year old female who  presents with the above-mentioned diagnosis.  I have counseled her in  regard to the risks and benefits of surgery including the risk of  infection, bleeding, anesthesia, damage to normal structures and failure  of surgery to accomplish its intended goals of relieving symptoms and  restoring function.  With this in mind, she desires to proceed.  All  questions have been encouraged and answered preoperatively.   OPERATIVE PROCEDURE:  The patient was seen by myself and anesthesia.  Th  arm was marked, preop evaluation was complete, and the patient then  underwent transfer to the operative arena, where she  underwent a general  anesthesia under the direction Laverle Hobby, MD.  She was prepped and  draped in the usual sterile fashion about the left upper extremity.  Preoperative Ancef was given and she was then prepped and draped in the  usual sterile fashion with Betadine scrub and paint.  Following this, I  performed a curvilinear incision about the dorsal aspect of her thumb.  Dissection was carried out.  The interval between the EPB and APL was  carried out.  A capsular flap was elevated off of the CMC joint.  Following this, the [facility] joint was prepared for the Artelon spacer  implant.  This was repaired without difficulty.  I knocked off  osteophytes and squared off the trapezium slightly.  I created bleeding  bone about the dorsal aspect of the metacarpal and the joint surface to  the trapezium as well as the dorsal surface of the trapezium.  I then  placed two Arthrex bioabsorbable suture anchors for fixation purposes  and following this placed the pre-soaked Artelon spacer in the defect.  I trimmed the Artelon spacer appropriately and following this identified  the Artelon spacer on x-ray, which looked excellent.  Once this was  done, I then placed the sutures through the Artelon spacer to tie it  down and secure it snugly.  This was done under direct vision and stress  radiography revealed excellent position.  Once this done, I then  performed examination fluoro revealing excellent position and then  closed the capsule with 4-0 FiberWire, suturing and down the spacer to  the periosteal region and to the capsular tissue.  Once this was done, I  then performed a first dorsal compartment release.  The first dorsal  compartment was released under direct vision off the dorsal ledge.  There was a separate compartment for the APL, which was decompressed as  well.   I then harvested a small slip of the digastric portion of the APL and  placed this around the ECRL tendon and then back upon  itself and the APL  proper to secure the [facility] joint and create a stable CMC region.  The [facility]  joint was highly stable.  Following this, x-rays looked excellent and I  was pleased with the arthroplasty, tendon transfer and dorsal  compartment release, which was performed without difficulty.  I then  made sure the capsule was closed nicely, which it was, and then  irrigated copiously followed by closure of the wound with Prolene.  The  patient had Adaptic followed by gauze and a sterile bandage as well as  thumb spica splint placed without difficulty.   Following this, I then performed removal of the drapes.  She was taken  to the recovery room in stable condition and be given postop  antibiotics, pain management according to protocol, and continued close  observation.  All questions have been encouraged and answered.           ______________________________  Dionne Ano. Everlene Other, M.D.     Nash Mantis  D:  02/16/2007  T:  02/16/2007  Job:  [id number]

## 2011-03-05 NOTE — Discharge Summary (Signed)
NAMEJENDAYA, GOSSETT             ACCOUNT NO.:  [id number]   MEDICAL RECORD NO.:  [id number]          PATIENT TYPE:  INP   LOCATION:  1304                         FACILITY:  [facility]   PHYSICIAN:  Barbette Hair. Arlyce Dice, MD,FACGDATE OF BIRTH:  [birth year]   DATE OF ADMISSION:  06/02/2006  DATE OF DISCHARGE:  06/03/2006                                 DISCHARGE SUMMARY   DIAGNOSIS ON ADMISSION:  1. Fever and abdominal pain following colonoscopy and a colon biopsy      earlier today.  2. History of irritable bowel syndrome, diarrhea prone.  3. History of Clostridium difficile colitis.  4. Hypertension.  5. Ongoing tobacco abuse.  6. Anxiety and depression.  7. Asthma.  8. Gravida 2, para 2.  9. Status post total abdominal hysterectomy and bilateral salpingo-      oophorectomy for non-malignant reasons.  10.Status post removal of right ganglion cyst.  11.Dyslipidemia.  12.Degenerative joint disease.  13.Lactose intolerance.  14.History of migraine headaches.   DISCHARGE DIAGNOSES:  1. Fever, possibly secondary to bronchitis.  No gastrointestinal pathology      uncovered during this observation admission.  2. Irritable bowel syndrome, diarrhea prone.  The patient's irritable      bowel syndrome symptoms flaring following colonoscopy.   PROCEDURES:  None.   CONSULTATIONS:  None.   BRIEF HISTORY:  Mrs. Starn is a 55 year old white female who has a history  of fairly severe IBS with chronic intermittent abdominal bloating, pain, and  bouts of diarrhea some times with nausea.  She has undergone various imaging  studies and colonoscopies in the past.  An attempt at  colonoscopy had been  made in June of 2007 in order to evaluate findings of left colon thickening  discovered on a CT in May.  However, sedation was inadequate and the  colonoscopy was aborted with an incomplete study.   The patient was brought back for full colonoscopy under propofol sedation on  06/02/2006.  This was  performed while the propofol was running wide open  per the nurse anesthetist.  Found out that study, were diffuse  diverticulosis, and left-sided patchy erythema Dr. Leone Payor felt was from  secondary to passage of the scope not secondary to colitis.  He did randomly  biopsy.  He noted that there was a strong IBS response despite the sedation.  No polyps were encountered.  The patient recovered from the anesthesia and  went home with no particular complaints.   When the patient got home, she felt a little bit bloated and crampy.  She  ate lunch.  She laid down and put a heating pad and ice packs on her abdomen  which are her usual measures.  She also took her Librax and Phenergan.  She  napped and woke up and the GI symptoms were still there and she felt warm.  She took her temperature and temperature was about 102.  She does state that  she had some chest congestion earlier in the day and has been coughing up  some greenish sputum.  She had used albuterol inhaler in the morning prior  to her  colonoscopy.   The patient contacted Dr. Marvell Fuller office and he advised her to come back  up and be evaluated in the emergency room.  In the emergency room, she was  uncomfortable and she is still had a fever of 101.7.  She was admitted for  observation and testing to determine the source of the fever.  She was  tender on abdominal exam.   LABORATORIES:  Hemoglobin 12.4, hematocrit 36.5, white blood cell count 9.3,  platelets 278, MCV 90, sodium 138, potassium was 3.3, BUN 5, and creatinine  0.9.  The glucose was 142.  On recheck, the glucose was 111 the following  day and the potassium had risen to 3.4.   RADIOLOGICAL DATA:  An acute abdominal series showed some mild bronchitis  type changes.  The bowel gas pattern was nonobstructive and there were no  acute intraabdominal findings.  No free air.   HOSPITAL COURSE:  The patient was admitted overnight and completed labs as  well as imaging  tests..  She was provided with IV Phenergan as well as IV  Dilaudid for pain and nausea management.  Most of her oral medications were  continued as well as she was allowed a diet of clear liquids once the  abdominal series ruled out bowel perforation.  The patient was hydrated with  IV fluids and with added potassium and given oral potassium supplements as  well.   The following morning she was feeling better.  She still had continued to  have fever through that morning with a temperature of 100.  Abdominal pain  had subsided somewhat.  She had only a small bowel movement the previous  day..  The diagnosis was that of fevers secondary to bronchitis as she  continued to have productive cough.   This patient was in stable condition and was discharged to home with a  prescription for Zithromax as well as some Tylenol suppositories.  She is to  follow up if she has persistent fevers by a contacting Dr. Tawanna Cooler.  For GI  issues, she can follow up with Dr. Leone Payor as needed but we do need to make  an appointment for her at this time but GI does not need to make a standing  appointment for her at this time.   MEDICATIONS AT DISCHARGE:  1. Tenoretic 100/25 mg 1/2 tablet daily.  2. Lisinopril 20 mg once daily.  3. Flexeril 10 mg at h.s. p.r.n..  4. Diazepam 2 mg up to twice daily as needed.  5. Glycopyrrolate as needed.  6. Promethazine 25 mg either p.o. or per rectum 1 to 2 every 4-6 hours as      needed for nausea.  7. Vicodin 5/500 once every 4-6 hours as needed.  8. Digestive advantage, which is an over-the-counter IBS type supplement      as needed.  9. Diphenoxylate/atropine 12.5 mg 1 to 2 ever 6 hours as needed.  10.Librax generic 2.5 mg 1 to 2 daily as needed.  11.Protonix 40 mg once daily.  12.Albuterol inhaler as needed.   DISCHARGE DIET:  Parke Simmers.   DISCHARGE INSTRUCTIONS:  She was advised to try to quit smoking.     ______________________________  Jennye Moccasin,  PA-C     Barbette Hair. Arlyce Dice, MD,FACG  Electronically Signed    SG/MEDQ  D:  06/03/2006  T:  06/03/2006  Job:  [id number]   cc:   Iva Boop, MD,FACG  [facility]  [address] [place], [place] [address]

## 2014-07-30 ENCOUNTER — Other Ambulatory Visit: Payer: Self-pay | Admitting: Neurology

## 2014-07-30 DIAGNOSIS — R51 Headache: Principal | ICD-10-CM

## 2014-07-30 DIAGNOSIS — M542 Cervicalgia: Secondary | ICD-10-CM

## 2014-07-30 DIAGNOSIS — R519 Headache, unspecified: Secondary | ICD-10-CM

## 2014-08-14 ENCOUNTER — Ambulatory Visit
Admission: RE | Admit: 2014-08-14 | Discharge: 2014-08-14 | Disposition: A | Discharge status: Home / Self Care | Payer: No Typology Code available for payment source | Source: Ambulatory Visit | Attending: Neurology | Admitting: Neurology

## 2014-08-14 DIAGNOSIS — R519 Headache, unspecified: Secondary | ICD-10-CM

## 2014-08-14 DIAGNOSIS — R51 Headache: Principal | ICD-10-CM

## 2014-08-14 DIAGNOSIS — M542 Cervicalgia: Secondary | ICD-10-CM

## 2014-08-19 ENCOUNTER — Other Ambulatory Visit: Payer: Self-pay | Admitting: Neurology

## 2014-08-19 DIAGNOSIS — I6529 Occlusion and stenosis of unspecified carotid artery: Secondary | ICD-10-CM

## 2014-08-23 ENCOUNTER — Ambulatory Visit
Admission: RE | Admit: 2014-08-23 | Discharge: 2014-08-23 | Disposition: A | Discharge status: Home / Self Care | Payer: No Typology Code available for payment source | Source: Ambulatory Visit | Attending: Neurology | Admitting: Neurology

## 2014-08-23 ENCOUNTER — Encounter (INDEPENDENT_AMBULATORY_CARE_PROVIDER_SITE_OTHER): Payer: Self-pay

## 2014-08-23 DIAGNOSIS — I6529 Occlusion and stenosis of unspecified carotid artery: Secondary | ICD-10-CM

## 2015-01-19 ENCOUNTER — Inpatient Hospital Stay (HOSPITAL_COMMUNITY)
Admission: AD | Admit: 2015-01-19 | Discharge: 2015-01-23 | DRG: 193 | Disposition: A | Discharge status: Home / Self Care | Payer: BLUE CROSS/BLUE SHIELD | Source: Other Acute Inpatient Hospital | Attending: Internal Medicine | Admitting: Internal Medicine

## 2015-01-19 ENCOUNTER — Encounter (HOSPITAL_COMMUNITY): Payer: Self-pay | Admitting: Internal Medicine

## 2015-01-19 DIAGNOSIS — J441 Chronic obstructive pulmonary disease with (acute) exacerbation: Secondary | ICD-10-CM | POA: Diagnosis present

## 2015-01-19 DIAGNOSIS — F17210 Nicotine dependence, cigarettes, uncomplicated: Secondary | ICD-10-CM | POA: Diagnosis present

## 2015-01-19 DIAGNOSIS — Z6827 Body mass index (BMI) 27.0-27.9, adult: Secondary | ICD-10-CM | POA: Diagnosis not present

## 2015-01-19 DIAGNOSIS — I10 Essential (primary) hypertension: Secondary | ICD-10-CM | POA: Diagnosis present

## 2015-01-19 DIAGNOSIS — K589 Irritable bowel syndrome without diarrhea: Secondary | ICD-10-CM | POA: Diagnosis present

## 2015-01-19 DIAGNOSIS — Z888 Allergy status to other drugs, medicaments and biological substances status: Secondary | ICD-10-CM | POA: Diagnosis not present

## 2015-01-19 DIAGNOSIS — J9601 Acute respiratory failure with hypoxia: Secondary | ICD-10-CM | POA: Diagnosis present

## 2015-01-19 DIAGNOSIS — E663 Overweight: Secondary | ICD-10-CM | POA: Diagnosis present

## 2015-01-19 DIAGNOSIS — E279 Disorder of adrenal gland, unspecified: Secondary | ICD-10-CM | POA: Diagnosis present

## 2015-01-19 DIAGNOSIS — E785 Hyperlipidemia, unspecified: Secondary | ICD-10-CM | POA: Diagnosis present

## 2015-01-19 DIAGNOSIS — G43909 Migraine, unspecified, not intractable, without status migrainosus: Secondary | ICD-10-CM | POA: Diagnosis present

## 2015-01-19 DIAGNOSIS — J45909 Unspecified asthma, uncomplicated: Secondary | ICD-10-CM | POA: Diagnosis present

## 2015-01-19 DIAGNOSIS — J101 Influenza due to other identified influenza virus with other respiratory manifestations: Secondary | ICD-10-CM | POA: Diagnosis present

## 2015-01-19 DIAGNOSIS — Z8249 Family history of ischemic heart disease and other diseases of the circulatory system: Secondary | ICD-10-CM | POA: Diagnosis not present

## 2015-01-19 DIAGNOSIS — G894 Chronic pain syndrome: Secondary | ICD-10-CM | POA: Diagnosis present

## 2015-01-19 DIAGNOSIS — Z882 Allergy status to sulfonamides status: Secondary | ICD-10-CM | POA: Diagnosis not present

## 2015-01-19 DIAGNOSIS — M545 Low back pain: Secondary | ICD-10-CM | POA: Diagnosis present

## 2015-01-19 HISTORY — DX: Irritable bowel syndrome, unspecified: K58.9

## 2015-01-19 HISTORY — DX: Essential (primary) hypertension: I10

## 2015-01-19 HISTORY — DX: Chronic obstructive pulmonary disease, unspecified: J44.9

## 2015-01-19 HISTORY — DX: Unspecified asthma, uncomplicated: J45.909

## 2015-01-19 HISTORY — DX: Migraine, unspecified, not intractable, without status migrainosus: G43.909

## 2015-01-19 MED ORDER — HYDROMORPHONE HCL 1 MG/ML IJ SOLN
0.5000 mg | INTRAMUSCULAR | Status: DC | PRN
  Administered 2015-01-19 – 2015-01-20 (×4): 1 mg via INTRAVENOUS
  Administered 2015-01-21: 0.5 mg via INTRAVENOUS
  Administered 2015-01-22 – 2015-01-23 (×2): 1 mg via INTRAVENOUS
  Filled 2015-01-19 (×8): qty 1

## 2015-01-19 MED ORDER — PANTOPRAZOLE SODIUM 40 MG IV SOLR
40.0000 mg | Freq: Every day | INTRAVENOUS | Status: DC
  Administered 2015-01-19 – 2015-01-20 (×2): 40 mg via INTRAVENOUS
  Filled 2015-01-19 (×3): qty 40

## 2015-01-19 MED ORDER — ENOXAPARIN SODIUM 40 MG/0.4ML ~~LOC~~ SOLN
40.0000 mg | Freq: Every day | SUBCUTANEOUS | Status: DC
  Administered 2015-01-19 – 2015-01-22 (×4): 40 mg via SUBCUTANEOUS
  Filled 2015-01-19 (×5): qty 0.4

## 2015-01-19 MED ORDER — HYDRALAZINE HCL 20 MG/ML IJ SOLN: 2.0000 mg | Freq: Four times a day (QID) | INTRAMUSCULAR | Status: DC | PRN

## 2015-01-19 MED ORDER — ONDANSETRON HCL 4 MG/2ML IJ SOLN: 4.0000 mg | Freq: Four times a day (QID) | INTRAMUSCULAR | Status: DC | PRN

## 2015-01-19 MED ORDER — SODIUM CHLORIDE 0.9 % IV SOLN
INTRAVENOUS | Status: DC
  Administered 2015-01-19 – 2015-01-22 (×5): via INTRAVENOUS

## 2015-01-19 MED ORDER — IPRATROPIUM-ALBUTEROL 0.5-2.5 (3) MG/3ML IN SOLN
3.0000 mL | RESPIRATORY_TRACT | Status: DC
  Administered 2015-01-19 – 2015-01-20 (×3): 3 mL via RESPIRATORY_TRACT
  Filled 2015-01-19 (×5): qty 3

## 2015-01-19 MED ORDER — ONDANSETRON HCL 4 MG PO TABS: 4.0000 mg | ORAL_TABLET | Freq: Four times a day (QID) | ORAL | Status: DC | PRN

## 2015-01-19 MED ORDER — ACETAMINOPHEN 325 MG PO TABS
650.0000 mg | ORAL_TABLET | Freq: Four times a day (QID) | ORAL | Status: DC | PRN
  Filled 2015-01-19: qty 2

## 2015-01-19 MED ORDER — METHYLPREDNISOLONE SODIUM SUCC 125 MG IJ SOLR
125.0000 mg | Freq: Once | INTRAMUSCULAR | Status: AC
  Administered 2015-01-20: 125 mg via INTRAVENOUS
  Filled 2015-01-19: qty 2

## 2015-01-19 MED ORDER — ALUM & MAG HYDROXIDE-SIMETH 200-200-20 MG/5ML PO SUSP: 30.0000 mL | Freq: Four times a day (QID) | ORAL | Status: DC | PRN

## 2015-01-19 MED ORDER — ACETAMINOPHEN 650 MG RE SUPP: 650.0000 mg | Freq: Four times a day (QID) | RECTAL | Status: DC | PRN

## 2015-01-19 MED ORDER — OXYCODONE HCL 5 MG PO TABS
5.0000 mg | ORAL_TABLET | ORAL | Status: DC | PRN
  Administered 2015-01-20 – 2015-01-21 (×3): 5 mg via ORAL
  Filled 2015-01-19 (×3): qty 1

## 2015-01-19 NOTE — H&P (Signed)
[facility] Admission History and Physical       Kayla CuriaCynthia L Hulbert ZOX:[id number] DOB: 1956-09-02 DOA: 01/19/2015  Referring physician: EDP PCP: Evette GeorgesDD,JEFFREY ALLEN, MD  Specialists:   Chief Complaint:   HPI: [name] is a 59 y.o. female with a history of Asthma/COPD, HTN, Migraines, Hyperlipidemia,IBS and Chronic  Low Back Pain who was seen at the [facility] ED due to complaints of Fevers, Chills, Nausea, and Vomiting x 1 day, and intermittent Migraine Headaches x 2 weeks.   She also had complaints of rhinitis and SOB and wheezing, and was noted as hypoxic and placed on supplemental O2 which was titrated to a Venturi Mask at %0% FiO2.   A Ct scan of the Head was performed a followed by a Lumbar Puncture which was negative for Infectious findings.   A CT scan of the Chest was also performed which revealed BronchitisfFindings, and incidentally revealed thickening of the Adrenal Gland and arrangements were made to transfer to [facility] for further treatment and evaluation.     Review of Systems:  Constitutional: No Weight Loss, No Weight Gain, Night Sweats, +Fevers, Chills, Dizziness, Light Headedness, Fatigue, or Generalized Weakness HEENT: +Headaches, Difficulty Swallowing,Tooth/Dental Problems,Sore Throat,  No Sneezing, Rhinitis, Ear Ache, Nasal Congestion, or Post Nasal Drip,  Cardio-vascular:  No Chest pain, Orthopnea, PND, Edema in Lower Extremities, Anasarca, Dizziness, Palpitations  Resp: No Dyspnea, No DOE, No Productive Cough, No Non-Productive Cough, No Hemoptysis, No Wheezing.    GI: No Heartburn, Indigestion, Abdominal Pain, +Nausea, Vomiting, Diarrhea, Constipation, Hematemesis, Hematochezia, Melena, Change in Bowel Habits,  Loss of Appetite  GU: No Dysuria, No Change in Color of Urine, No Urgency or Urinary Frequency, No Flank pain.  Musculoskeletal: No Joint Pain or Swelling, No Decreased Range of Motion, No Back Pain.  Neurologic: No Syncope, No  Seizures, Muscle Weakness, Paresthesia, Vision Disturbance or Loss, No Diplopia, No Vertigo, No Difficulty Walking,  Skin: No Rash or Lesions. Psych: No Change in Mood or Affect, No Depression or Anxiety, No Memory loss, No Confusion, or Hallucinations   Past Medical History  Diagnosis Date  . Asthma   . COPD (chronic obstructive pulmonary disease)   . Benign essential HTN   . Migraine headache   . IBS (irritable bowel syndrome)      No past surgical history on file.    Prior to Admission medications   Not on File     Allergies  Allergen Reactions  . Acetaminophen     REACTION: unspecified  . Aspirin     REACTION: unspecified  . Prednisone     REACTION: ibs probs  . Propofol     REACTION: FEVER  . Statins   . Sulfamethoxazole-Trimethoprim     REACTION: antibiotic induced colitis    Social History:  reports that she has been smoking Cigarettes.  She has been smoking about 1.00 pack per day. She does not have any smokeless tobacco history on file. She reports that she drinks alcohol. She reports that she does not use illicit drugs.    Family History  Problem Relation Age of Onset  . Hypertension Mother   . CAD Brother     Deceased on MI age 58  . Cancer - Other Father     Oropharyngeal SCCa       Physical Exam:  GEN:  Pleasant Obese  59 y.o. female examined and in discomfort but no acute distress; cooperative with exam Filed Vitals:   01/19/15 2013  BP: 150/56  Pulse: 91  Temp: 98.8 F (37.1 C)  TempSrc: Oral  Resp: 18  Height:  (1.626 m)  Weight: 72.258 kg (159 lb 4.8 oz)  SpO2: 94%   Blood pressure 150/56, pulse 91, temperature 98.8 F (37.1 C), temperature source Oral, resp. rate 18, height  (1.626 m), weight 72.258 kg (159 lb 4.8 oz), SpO2 94 %. PSYCH: She is alert and oriented x4; does not appear anxious does not appear depressed; affect is normal HEENT: Normocephalic and Atraumatic, Mucous membranes pink; PERRLA; EOM intact; Fundi:   Benign;  No scleral icterus, Nares: Patent, Oropharynx: Clear,  Fair Dentition,    Neck:  FROM, No Cervical Lymphadenopathy nor Thyromegaly or Carotid Bruit; No JVD; Breasts:: Not examined CHEST WALL: No tenderness CHEST: Normal respiration, clear to auscultation bilaterally HEART: Regular rate and rhythm; no murmurs rubs or gallops BACK: No kyphosis or scoliosis; No CVA tenderness ABDOMEN: Positive Bowel Sounds, Obese, Soft Non-Tender, No Rebound or Guarding; No Masses, No Organomegaly. Rectal Exam: Not done EXTREMITIES: No Cyanosis, Clubbing, or Edema; No Ulcerations. Genitalia: not examined PULSES: 2+ and symmetric SKIN: Normal hydration no rash or ulceration CNS:  Alert and Oriented x 4, No Focal Deficits Vascular: pulses palpable throughout    Labs on Admission:  Basic Metabolic Panel: No results for input(s): NA, K, CL, CO2, GLUCOSE, BUN, CREATININE, CALCIUM, MG, PHOS in the last 168 hours. Liver Function Tests: No results for input(s): AST, ALT, ALKPHOS, BILITOT, PROT, ALBUMIN in the last 168 hours. No results for input(s): LIPASE, AMYLASE in the last 168 hours. No results for input(s): AMMONIA in the last 168 hours. CBC: No results for input(s): WBC, NEUTROABS, HGB, HCT, MCV, PLT in the last 168 hours. Cardiac Enzymes: No results for input(s): CKTOTAL, CKMB, CKMBINDEX, TROPONINI in the last 168 hours.  BNP (last 3 results) No results for input(s): BNP in the last 8760 hours.  ProBNP (last 3 results) No results for input(s): PROBNP in the last 8760 hours.  CBG: No results for input(s): GLUCAP in the last 168 hours.  Radiological Exams on Admission: No results found.     EKG: Independently reviewed from Previous Facility. Sinus Tachycardia at rate = 118   Assessment/Plan:   59 y.o. female with  Active Problems:   1.    Acute respiratory failure with hypoxia   Supplemental O2 PRN   Monitor O2 Sats Keep > 92%     2.    COPD exacerbation   IV  Solumedrol   DuoNebs   Mcuinex PRN     3.    Adrenal abnormality   Further Evaluation and consultation    Deferred to Primary Roundind Team     4.    Essential hypertension   PRN IV Hydralazine    Verify Home Meds     5.    Hyperlipidemia   Resume Home Rx once Meds Verified     6.    Migraine   Pain Control PRN with IV Dilaudid     7.   Chronic pain syndrome   PRN IV Dilaudid while Inpatient     8.   DVT Prophylaxis    Lovenox          Code Status:     FULL CODE       Family Communication:   Husband Family at Bedside      Disposition Plan:    Inpatient  Status        Time spent:  13 Minutes  Ron Parker [facility] Pager [phone]   If 7AM -7PM Please Contact the Day Rounding Team MD for [facility]  If 7PM-7AM, Please Contact Night-Floor Coverage  [email or web address] Password [email or web address] 01/19/2015, 9:25 PM     ADDENDUM:   Patient was seen and examined on 01/19/2015

## 2015-01-20 ENCOUNTER — Encounter (HOSPITAL_COMMUNITY): Payer: Self-pay | Admitting: *Deleted

## 2015-01-20 ENCOUNTER — Inpatient Hospital Stay (HOSPITAL_COMMUNITY): Payer: BLUE CROSS/BLUE SHIELD

## 2015-01-20 LAB — BASIC METABOLIC PANEL
ANION GAP: 6 (ref 5–15)
BUN: 9 mg/dL (ref 6–23)
CALCIUM: 8.5 mg/dL (ref 8.4–10.5)
CO2: 28 mmol/L (ref 19–32)
Chloride: 99 mmol/L (ref 96–112)
Creatinine, Ser: 0.69 mg/dL (ref 0.50–1.10)
GFR calc non Af Amer: >90 mL/min (ref >90)
Glucose, Bld: 161 mg/dL — ABNORMAL HIGH (ref 70–99)
Potassium: 4.7 mmol/L (ref 3.5–5.1)
Sodium: 133 mmol/L — ABNORMAL LOW (ref 135–145)

## 2015-01-20 LAB — CBC
HCT: 43.3 % (ref 36.0–46.0)
Hemoglobin: 14.7 g/dL (ref 12.0–15.0)
MCH: 31.3 pg (ref 26.0–34.0)
MCHC: 33.9 g/dL (ref 30.0–36.0)
MCV: 92.1 fL (ref 78.0–100.0)
PLATELETS: 189 10*3/uL (ref 150–400)
RBC: 4.7 MIL/uL (ref 3.87–5.11)
RDW: 13.8 % (ref 11.5–15.5)
WBC: 6.7 10*3/uL (ref 4.0–10.5)

## 2015-01-20 LAB — INFLUENZA PANEL BY PCR (TYPE A & B)
H1N1 flu by pcr: DETECTED — AB
INFLAPCR: POSITIVE — AB
INFLBPCR: NEGATIVE

## 2015-01-20 MED ORDER — IOHEXOL 300 MG/ML  SOLN
100.0000 mL | Freq: Once | INTRAMUSCULAR | Status: AC | PRN
  Administered 2015-01-20: 100 mL via INTRAVENOUS

## 2015-01-20 MED ORDER — METHYLPREDNISOLONE SODIUM SUCC 125 MG IJ SOLR
60.0000 mg | Freq: Four times a day (QID) | INTRAMUSCULAR | Status: DC
  Administered 2015-01-20 – 2015-01-22 (×9): 60 mg via INTRAVENOUS
  Filled 2015-01-20 (×3): qty 2
  Filled 2015-01-20 (×2): qty 0.96
  Filled 2015-01-20: qty 2
  Filled 2015-01-20 (×6): qty 0.96
  Filled 2015-01-20 (×2): qty 2

## 2015-01-20 MED ORDER — HYOSCYAMINE SULFATE 0.125 MG SL SUBL
0.1250 mg | SUBLINGUAL_TABLET | SUBLINGUAL | Status: DC | PRN
  Filled 2015-01-20: qty 2

## 2015-01-20 MED ORDER — PROMETHAZINE HCL 25 MG/ML IJ SOLN
12.5000 mg | Freq: Four times a day (QID) | INTRAMUSCULAR | Status: DC | PRN
  Administered 2015-01-20 – 2015-01-22 (×7): 12.5 mg via INTRAVENOUS
  Filled 2015-01-20 (×7): qty 1

## 2015-01-20 MED ORDER — DEXTROSE 5 % IV SOLN
500.0000 mg | INTRAVENOUS | Status: DC
  Administered 2015-01-20 – 2015-01-21 (×2): 500 mg via INTRAVENOUS
  Filled 2015-01-20 (×3): qty 500

## 2015-01-20 MED ORDER — LISINOPRIL 20 MG PO TABS
20.0000 mg | ORAL_TABLET | Freq: Every day | ORAL | Status: DC
  Administered 2015-01-20 – 2015-01-23 (×4): 20 mg via ORAL
  Filled 2015-01-20 (×6): qty 1

## 2015-01-20 MED ORDER — OSELTAMIVIR PHOSPHATE 75 MG PO CAPS
75.0000 mg | ORAL_CAPSULE | Freq: Two times a day (BID) | ORAL | Status: DC
  Administered 2015-01-20 – 2015-01-23 (×6): 75 mg via ORAL
  Filled 2015-01-20 (×8): qty 1

## 2015-01-20 MED ORDER — IPRATROPIUM-ALBUTEROL 0.5-2.5 (3) MG/3ML IN SOLN
3.0000 mL | RESPIRATORY_TRACT | Status: DC | PRN
  Administered 2015-01-21 (×2): 3 mL via RESPIRATORY_TRACT
  Filled 2015-01-20 (×2): qty 3

## 2015-01-20 MED ORDER — CEFTRIAXONE SODIUM IN DEXTROSE 20 MG/ML IV SOLN
1.0000 g | INTRAVENOUS | Status: DC
  Administered 2015-01-20 – 2015-01-23 (×4): 1 g via INTRAVENOUS
  Filled 2015-01-20 (×4): qty 50

## 2015-01-20 NOTE — Progress Notes (Signed)
Utilization review completed. Dru Laurel, RN, BSN. 

## 2015-01-20 NOTE — Progress Notes (Addendum)
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 female with a history of Asthma/COPD, HTN, Migraines, Hyperlipidemia,IBS and Chronic Low Back Pain who was seen at the [facility] ED due to complaints of Fevers, Chills, Nausea, and Vomiting x 1 day, and intermittent Migraine Headaches x 2 weeks. She also had complaints of rhinitis and SOB and wheezing, and was noted as hypoxic and placed on supplemental O2 which was titrated to a Venturi Mask at %0% FiO2. A Ct scan of the Head was performed a followed by a Lumbar Puncture which was negative for Infectious findings. A CT scan of the Chest was also performed which revealed BronchitisfFindings, and incidentally revealed thickening of the Adrenal Gland and arrangements were made to transfer to [facility] for further treatment and evaluation  Consultants:  None    Procedures: None  Antibiotics: rocephine Azithromycin   HPI/Subjective: Denies CP, STILL WHEEZING   Objective: Filed Vitals:    01/20/15 0451 01/20/15 0821 01/20/15 1021 01/20/15 1100  BP:   175/90 158/76  Pulse:   96   Temp:   98.4 F (36.9 C)   TempSrc:      Resp:   18   Height:      Weight:      SpO2: 94% 92% 97% 94%    Intake/Output Summary (Last 24 hours) at 01/20/15 1152 Last data filed at 01/20/15 1024  Gross per 24 hour  Intake    240 ml  Output    550 ml  Net   -310 ml    Exam:  General: No acute respiratory distress Lungs: WHEEZING ,  bilaterally without wheezes or crackles Cardiovascular: Regular rate and rhythm without murmur gallop or rub normal S1 and S2 Abdomen: Nontender, nondistended, soft, bowel sounds positive, no rebound, no ascites, no appreciable mass Extremities: No significant cyanosis, clubbing, or edema bilateral lower extremities      Data Reviewed: Basic Metabolic Panel:  Recent Labs Lab 01/20/15 0636  NA 133*  K 4.7  CL 99  CO2 28  GLUCOSE 161*  BUN 9  CREATININE 0.69  CALCIUM 8.5    Liver Function Tests: No results for input(s): AST, ALT, ALKPHOS, BILITOT, PROT, ALBUMIN in the last 168 hours. No results for input(s): LIPASE, AMYLASE in the last 168 hours. No results for input(s): AMMONIA in the last 168 hours.  CBC:  Recent Labs Lab 01/20/15 0636  WBC 6.7  HGB 14.7  HCT 43.3  MCV 92.1  PLT 189    Cardiac Enzymes: No results for input(s): CKTOTAL, CKMB, CKMBINDEX, TROPONINI in the last 168 hours. BNP (last 3 results) No results for input(s): BNP in the  last 8760 hours.  ProBNP (last 3 results) No results for input(s): PROBNP in the last 8760 hours.    CBG: No results for input(s): GLUCAP in the last 168 hours.  No results found for this or any previous visit (from the past 240 hour(s)).   Studies: No results found.  Scheduled Meds: . azithromycin  500 mg Intravenous Q24H  . cefTRIAXone (ROCEPHIN)  IV  1 g Intravenous Q24H  . enoxaparin (LOVENOX) injection  40 mg Subcutaneous QHS  . ipratropium-albuterol  3 mL Nebulization Q4H   . methylPREDNISolone (SOLU-MEDROL) injection  60 mg Intravenous Q6H  . pantoprazole (PROTONIX) IV  40 mg Intravenous QHS   Continuous Infusions: . sodium chloride 75 mL/hr at 01/19/15 2231    Active Problems:   Hyperlipidemia   Migraine   COPD exacerbation   Acute respiratory failure with hypoxia   Adrenal abnormality   Essential hypertension   Chronic pain syndrome   Acute respiratory failure    Time spent: 40 minutes   [facility]  [facility] Pager [phone]. If 7PM-7AM, please contact night-coverage at [email or web address], password [facility] 01/20/2015, 11:52 AM  LOS: 1 day

## 2015-01-21 LAB — CBC
HEMATOCRIT: 45 % (ref 36.0–46.0)
HEMOGLOBIN: 14.6 g/dL (ref 12.0–15.0)
MCH: 30.4 pg (ref 26.0–34.0)
MCHC: 32.4 g/dL (ref 30.0–36.0)
MCV: 93.8 fL (ref 78.0–100.0)
Platelets: 206 10*3/uL (ref 150–400)
RBC: 4.8 MIL/uL (ref 3.87–5.11)
RDW: 13.4 % (ref 11.5–15.5)
WBC: 9.9 10*3/uL (ref 4.0–10.5)

## 2015-01-21 LAB — COMPREHENSIVE METABOLIC PANEL
ALBUMIN: 3.7 g/dL (ref 3.5–5.2)
ALK PHOS: 57 U/L (ref 39–117)
ALT: 16 U/L (ref 0–35)
ANION GAP: 7 (ref 5–15)
AST: 33 U/L (ref 0–37)
BILIRUBIN TOTAL: 0.7 mg/dL (ref 0.3–1.2)
BUN: 14 mg/dL (ref 6–23)
CHLORIDE: 99 mmol/L (ref 96–112)
CO2: 31 mmol/L (ref 19–32)
Calcium: 8.5 mg/dL (ref 8.4–10.5)
Creatinine, Ser: 0.7 mg/dL (ref 0.50–1.10)
GFR calc Af Amer: >90 mL/min (ref >90)
GFR calc non Af Amer: >90 mL/min (ref >90)
GLUCOSE: 130 mg/dL — AB (ref 70–99)
Potassium: 4.9 mmol/L (ref 3.5–5.1)
Sodium: 137 mmol/L (ref 135–145)
Total Protein: 6.6 g/dL (ref 6.0–8.3)

## 2015-01-21 LAB — CANCER ANTIGEN 19-9: CA 19 9: 19 U/mL (ref 0–35)

## 2015-01-21 MED ORDER — HYDRALAZINE HCL 20 MG/ML IJ SOLN
10.0000 mg | Freq: Four times a day (QID) | INTRAMUSCULAR | Status: DC | PRN
  Administered 2015-01-21 – 2015-01-23 (×3): 10 mg via INTRAVENOUS
  Filled 2015-01-21 (×6): qty 1

## 2015-01-21 MED ORDER — SALINE SPRAY 0.65 % NA SOLN
1.0000 | NASAL | Status: DC | PRN
  Filled 2015-01-21: qty 44

## 2015-01-21 MED ORDER — CETYLPYRIDINIUM CHLORIDE 0.05 % MT LIQD
7.0000 mL | Freq: Two times a day (BID) | OROMUCOSAL | Status: DC
  Administered 2015-01-21 – 2015-01-22 (×3): 7 mL via OROMUCOSAL

## 2015-01-21 MED ORDER — IPRATROPIUM-ALBUTEROL 0.5-2.5 (3) MG/3ML IN SOLN
3.0000 mL | Freq: Three times a day (TID) | RESPIRATORY_TRACT | Status: DC
  Administered 2015-01-21 – 2015-01-22 (×2): 3 mL via RESPIRATORY_TRACT
  Filled 2015-01-21 (×2): qty 3

## 2015-01-21 MED ORDER — DIAZEPAM 5 MG PO TABS
5.0000 mg | ORAL_TABLET | Freq: Two times a day (BID) | ORAL | Status: DC | PRN
  Administered 2015-01-21 – 2015-01-22 (×2): 5 mg via ORAL
  Filled 2015-01-21 (×2): qty 1

## 2015-01-21 MED ORDER — FLUTICASONE PROPIONATE 50 MCG/ACT NA SUSP
2.0000 | Freq: Every day | NASAL | Status: DC
  Administered 2015-01-21 – 2015-01-23 (×3): 2 via NASAL
  Filled 2015-01-21: qty 16

## 2015-01-21 MED ORDER — OXYCODONE HCL 5 MG PO TABS
5.0000 mg | ORAL_TABLET | ORAL | Status: DC | PRN
  Administered 2015-01-21: 5 mg via ORAL
  Administered 2015-01-21 – 2015-01-23 (×5): 10 mg via ORAL
  Administered 2015-01-23: 5 mg via ORAL
  Filled 2015-01-21 (×4): qty 2
  Filled 2015-01-21: qty 1
  Filled 2015-01-21 (×2): qty 2

## 2015-01-21 MED ORDER — PANTOPRAZOLE SODIUM 40 MG PO TBEC
40.0000 mg | DELAYED_RELEASE_TABLET | Freq: Every day | ORAL | Status: DC
  Administered 2015-01-21 – 2015-01-22 (×2): 40 mg via ORAL
  Filled 2015-01-21 (×2): qty 1

## 2015-01-21 NOTE — Progress Notes (Signed)
[facility] PROGRESS NOTE  [name] ZOX:[id number] DOB: Feb 08, 1956 DOA: 01/19/2015 PCP: Evette GeorgesDD,JEFFREY ALLEN, MD  Assessment/Plan: Active Problems:   Hyperlipidemia   Migraine   COPD exacerbation   Acute respiratory failure with hypoxia   Adrenal abnormality   Essential hypertension   Chronic pain syndrome   Acute respiratory failure    Acute hypoxic respiratory failure Improving, secondary to COPD exacerbation in the setting of flu Continue IV Solu-Medrol, Rocephin and azithromycin for COPD exacerbation Mucinex when necessary Influenza PCR positive Patient started on Tamiflu, day #2  Headache Status post LP prior to transferred to [facility] Increase oxycodone to 5-10 mg every 4 hours History of migraine headaches likely compounded by the flu, We'll start the patient on decongestants If no improvement will consult neurology Patient also needs better blood pressure control  Hypertension, uncontrolled Patient on lisinopril, Also supposed to be taking Valium, will restart at a lower dose Blood pressure may be uncontrolled because of not receiving Valium  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 female with a history of Asthma/COPD, HTN, Migraines, Hyperlipidemia,IBS and Chronic Low Back Pain who was seen at the [facility] ED due to complaints of Fevers, Chills, Nausea, and Vomiting x 1 day, and intermittent Migraine Headaches x 2 weeks. She also had complaints of rhinitis and SOB and wheezing, and was noted as hypoxic and placed on supplemental O2 which was  titrated to a Venturi Mask at %0% FiO2. A Ct scan of the Head was performed a followed by a Lumbar Puncture which was negative for Infectious findings. A CT scan of the Chest was also performed which revealed BronchitisfFindings, and incidentally revealed thickening of the Adrenal Gland and arrangements were made to transfer to [facility] for further treatment and evaluation  Consultants:  None    Procedures: None  Antibiotics: rocephine Azithromycin   HPI/Subjective: Complaining of nausea, headache after lumbar puncture, nasal congestion  Objective: Filed Vitals:   01/21/15 0156 01/21/15 0210 01/21/15 0529 01/21/15 0559  BP: 182/84 146/72 178/85 156/90  Pulse: 98  93   Temp: 99.6 F (37.6 C)  98.7 F (37.1 C)   TempSrc: Oral  Oral   Resp: 20  17   Height:      Weight:      SpO2: 95%  94%     Intake/Output Summary (Last 24 hours) at 01/21/15 0954 Last data filed at 01/21/15 0949  Gross per 24 hour  Intake 2571.25 ml  Output   2850 ml  Net -278.75 ml    Exam:  General: No acute respiratory distress Lungs: WHEEZING ,  bilaterally without wheezes or crackles Cardiovascular: Regular rate and rhythm without murmur gallop or rub normal S1 and S2 Abdomen: Nontender, nondistended, soft, bowel sounds positive, no rebound, no ascites, no appreciable mass Extremities: No significant cyanosis, clubbing, or edema bilateral lower extremities      Data Reviewed: Basic Metabolic Panel:  Recent Labs Lab 01/20/15 0636  NA 133*  K 4.7  CL 99  CO2 28  GLUCOSE 161*  BUN 9  CREATININE 0.69  CALCIUM 8.5    Liver Function Tests: No results for  input(s): AST, ALT, ALKPHOS, BILITOT, PROT, ALBUMIN in the last 168 hours. No results for input(s): LIPASE, AMYLASE in the last 168 hours. No results for input(s): AMMONIA in the last 168 hours.  CBC:  Recent Labs Lab 01/29/2015 0636 01/21/15 0805  WBC 6.7 9.9  HGB 14.7 14.6  HCT 43.3 45.0  MCV 92.1 93.8  PLT 189  206    Cardiac Enzymes: No results for input(s): CKTOTAL, CKMB, CKMBINDEX, TROPONINI in the last 168 hours. BNP (last 3 results) No results for input(s): BNP in the last 8760 hours.  ProBNP (last 3 results) No results for input(s): PROBNP in the last 8760 hours.    CBG: No results for input(s): GLUCAP in the last 168 hours.  No results found for this or any previous visit (from the past 240 hour(s)).   Studies: Ct Abdomen Wo Contrast  Jan 29, 2015   CLINICAL DATA:  Chronic upper abdominal pain for 2 years.  EXAM: CT ABDOMEN WITHOUT CONTRAST  TECHNIQUE: Multidetector CT imaging of the abdomen was performed following the standard protocol without IV contrast.  COMPARISON:  Chest CT of January 19, 2015. CT scan of abdomen of June 11, 2014.  FINDINGS: No gallstones are noted. There does not appear to be any definite evidence of adrenal mass or hemorrhage. There is noted increased stranding of the fat superior to the right kidney which may represent focal inflammation. No hydronephrosis or renal obstruction is noted. Residual contrast is noted within the intrarenal collecting system bilaterally. Atherosclerotic calcifications of the abdominal aorta are noted without aneurysm formation. No focal abnormality is noted in the liver or spleen on these unenhanced images. As noted on prior CT exam, solid appearance of pancreatic head is noted and further evaluation with MRI is recommended. There is no evidence of bowel obstruction. The appendix appears visualize an within normal limits. Diverticulosis of the visualized colon is noted without inflammation.  IMPRESSION: Adrenal glands appear to be unremarkable and unchanged compared to prior exam of June 11, 2014. There is no evidence of adrenal mass or hemorrhage.  There is noted slightly increased inflammatory changes in the pararenal fat superior to the right kidney suggesting focal inflammation.  Prominence of the pancreatic head is again noted as described  on prior chest CT scan, and further evaluation with MRI with and without gadolinium according to pancreatic protocol is recommended on nonemergent basis.   Electronically Signed   By: Lupita Raider, M.D.   On: 01/29/15 13:52    Scheduled Meds: . antiseptic oral rinse  7 mL Mouth Rinse BID  . azithromycin  500 mg Intravenous Q24H  . cefTRIAXone (ROCEPHIN)  IV  1 g Intravenous Q24H  . enoxaparin (LOVENOX) injection  40 mg Subcutaneous QHS  . lisinopril  20 mg Oral Daily  . methylPREDNISolone (SOLU-MEDROL) injection  60 mg Intravenous Q6H  . oseltamivir  75 mg Oral BID  . pantoprazole  40 mg Oral QHS   Continuous Infusions: . sodium chloride 75 mL/hr at 01/21/15 0140    Active Problems:   Hyperlipidemia   Migraine   COPD exacerbation   Acute respiratory failure with hypoxia   Adrenal abnormality   Essential hypertension   Chronic pain syndrome   Acute respiratory failure    Time spent: 40 minutes   [facility]  [facility] Pager [phone]. If 7PM-7AM, please contact night-coverage at [email or web address], password [facility] 01/21/2015, 9:54 AM  LOS: 2 days

## 2015-01-22 DIAGNOSIS — E663 Overweight: Secondary | ICD-10-CM | POA: Diagnosis present

## 2015-01-22 MED ORDER — IPRATROPIUM-ALBUTEROL 0.5-2.5 (3) MG/3ML IN SOLN
3.0000 mL | Freq: Two times a day (BID) | RESPIRATORY_TRACT | Status: DC
  Administered 2015-01-23: 3 mL via RESPIRATORY_TRACT
  Filled 2015-01-22 (×2): qty 3

## 2015-01-22 MED ORDER — DIPHENHYDRAMINE HCL 25 MG PO CAPS
25.0000 mg | ORAL_CAPSULE | Freq: Every evening | ORAL | Status: DC | PRN
  Filled 2015-01-22: qty 1

## 2015-01-22 MED ORDER — AZITHROMYCIN 500 MG PO TABS
500.0000 mg | ORAL_TABLET | Freq: Every day | ORAL | Status: DC
  Administered 2015-01-22 – 2015-01-23 (×2): 500 mg via ORAL
  Filled 2015-01-22 (×2): qty 1

## 2015-01-22 MED ORDER — METHYLPREDNISOLONE SODIUM SUCC 40 MG IJ SOLR
40.0000 mg | Freq: Three times a day (TID) | INTRAMUSCULAR | Status: DC
  Filled 2015-01-22 (×2): qty 1

## 2015-01-22 MED ORDER — IPRATROPIUM-ALBUTEROL 0.5-2.5 (3) MG/3ML IN SOLN: 3.0000 mL | Freq: Four times a day (QID) | RESPIRATORY_TRACT | Status: DC | PRN

## 2015-01-22 NOTE — Progress Notes (Addendum)
PROGRESS NOTE  Simone CuriaCynthia L Santistevan UJW:[id number] DOB: 1956/10/15 DOA: 01/19/2015 PCP: Evette GeorgesDD,JEFFREY ALLEN, MD  HPI/Recap of past 2524 hours: 59 year old female past oral history of COPD and hypertension. [facility] complaining of fevers chills and intermittent migraine headaches along with shortness of breath and wheezing and noted to be hypoxic. CT scan of the head and lumbar puncture negative for findings. Patient transferred to [facility] after adrenal gland thickening noted. Patient states breathing is better. She herself has no complaints. Currently no migraine headache. She was ambulated today on room air sats okay at rest, but with ambulation, sats dropped down to 87%.  Assessment/Plan:  Active Problems:   Hyperlipidemia: Stable   Migraine: Stable for now   Acute respiratory failure with hypoxia secondary to COPD exacerbation: Continue IV steroids, tapering down. Continue IV antibiotics. Continue supplemental oxygen  Adrenal abnormality: CA 19-9 level unremarkable. Patient will need outpatient MRI with/without contrast for further delineation.   Essential hypertension: Stable   Chronic pain syndrome: Stable   Overweight (BMI 25.0-29.9): Patient is criteria with BMI 25   Code Status: Full code  Family Communication: Husband at the bedside  Disposition Plan: Home once hypoxia resolved   Consultants:  None  Procedures:  Lumbar puncture done prior to admission at [facility]  Antibiotics:  IV Rocephin for/3-present  IV Zithromax for/3-present   Objective: BP 176/82 mmHg  Pulse 85  Temp(Src) 98.7 F (37.1 C) (Oral)  Resp 14  Ht 5\' 4"  (1.626 m)  Wt 72.258 kg (159 lb 4.8 oz)  BMI 27.33 kg/m2  SpO2 90%  Intake/Output Summary (Last 24 hours) at 01/22/15 1703 Last data filed at 01/22/15 1023  Gross per 24 hour  Intake 2647.5 ml  Output   1200 ml  Net 1447.5 ml   Filed Weights   01/19/15 2013  Weight: 72.258 kg (159 lb 4.8 oz)     Exam:   General:  Alert and oriented 3, no acute distress  Cardiovascular: Regular rate and rhythm, S1 and S2  Respiratory: Decreased breath sounds throughout  Abdomen: Soft, nontender, nondistended, positive bowel sounds  Musculoskeletal: No clubbing or cyanosis or edema   Data Reviewed: Basic Metabolic Panel:  Recent Labs Lab 01/20/15 0636 01/21/15 0805  NA 133* 137  K 4.7 4.9  CL 99 99  CO2 28 31  GLUCOSE 161* 130*  BUN 9 14  CREATININE 0.69 0.70  CALCIUM 8.5 8.5   Liver Function Tests:  Recent Labs Lab 01/21/15 0805  AST 33  ALT 16  ALKPHOS 57  BILITOT 0.7  PROT 6.6  ALBUMIN 3.7   No results for input(s): LIPASE, AMYLASE in the last 168 hours. No results for input(s): AMMONIA in the last 168 hours. CBC:  Recent Labs Lab 01/20/15 0636 01/21/15 0805  WBC 6.7 9.9  HGB 14.7 14.6  HCT 43.3 45.0  MCV 92.1 93.8  PLT 189 206   Cardiac Enzymes:   No results for input(s): CKTOTAL, CKMB, CKMBINDEX, TROPONINI in the last 168 hours. BNP (last 3 results) No results for input(s): BNP in the last 8760 hours.  ProBNP (last 3 results) No results for input(s): PROBNP in the last 8760 hours.  CBG: No results for input(s): GLUCAP in the last 168 hours.  No results found for this or any previous visit (from the past 240 hour(s)).   Studies: No results found.  Scheduled Meds: . antiseptic oral rinse  7 mL Mouth Rinse BID  . azithromycin  500 mg Oral Daily  .  cefTRIAXone (ROCEPHIN)  IV  1 g Intravenous Q24H  . enoxaparin (LOVENOX) injection  40 mg Subcutaneous QHS  . fluticasone  2 spray Each Nare Daily  . ipratropium-albuterol  3 mL Nebulization BID  . lisinopril  20 mg Oral Daily  . methylPREDNISolone (SOLU-MEDROL) injection  60 mg Intravenous Q6H  . oseltamivir  75 mg Oral BID  . pantoprazole  40 mg Oral QHS    Continuous Infusions: . sodium chloride 75 mL/hr at 01/22/15 0524     Time spent: 25 min  Hollice Espy  [facility] Pager [phone]. If 7PM-7AM, please contact night-coverage at [email or web address], password [facility] 01/22/2015, 5:03 PM  LOS: 3 days

## 2015-01-22 NOTE — Progress Notes (Signed)
Patient states "im tired of wearing this oxygen and want to go home." 02 sats on room air at rest ranged from 92-94%. Pt informed RN that she wanted to try and ambulate. Pt asleep when RN returned. Will continue to monitor.

## 2015-01-22 NOTE — Progress Notes (Signed)
   Patient Saturations on Room Air at Rest = 91%  Patient Saturations on Room Air while Ambulating = 86%   MD made aware.

## 2015-01-23 MED ORDER — PREDNISONE 10 MG PO TABS: ORAL_TABLET | ORAL | Status: DC

## 2015-01-23 MED ORDER — ZOLPIDEM TARTRATE 5 MG PO TABS
5.0000 mg | ORAL_TABLET | Freq: Once | ORAL | Status: AC
  Administered 2015-01-23: 5 mg via ORAL
  Filled 2015-01-23: qty 1

## 2015-01-23 MED ORDER — OXYCODONE HCL 5 MG PO TABS: 5.0000 mg | ORAL_TABLET | Freq: Four times a day (QID) | ORAL | Status: DC | PRN

## 2015-01-23 MED ORDER — OSELTAMIVIR PHOSPHATE 75 MG PO CAPS: 75.0000 mg | ORAL_CAPSULE | Freq: Two times a day (BID) | ORAL | Status: DC

## 2015-01-23 NOTE — Progress Notes (Addendum)
Patient ambulated around the nurses station. O2 Saturation went down to 83% on room air. Patient complained of dyspnea on exertion during walk. Physician notified. Rockne CoonsCorcoran, Rachel C . 3:18 PM 01/23/2015  SATURATION QUALIFICATIONS: (This note is used to comply with regulatory documentation for home oxygen)  Patient Saturations on Room Air at Rest = 90%  Patient Saturations on Room Air while Ambulating = 83%  Patient Saturations on 2 Liters of oxygen while Ambulating = 95%  Please briefly explain why patient needs home oxygen: Pt does not maintain adequate oxygenation when ambulating or with activity.

## 2015-01-23 NOTE — Discharge Summary (Addendum)
Discharge Summary  [name]:[id number] DOB: [birth year]  PCP: Evette Georges, MD  Admit date: 01/19/2015 Discharge date: 01/23/2015  Time spent: 25 minutes  Recommendations for Outpatient Follow-up:  1. Patient will follow-up with her PCP Dr. Alonza Smoker in the next 2-4 weeks. At that time he will coordinate scheduling outpatient MRI with and without gadolinium to better evaluate her adrenal glands. 2. New medication: Tamiflu 75 mg by mouth twice a day 3 more doses 3. New Medication: Prednisone taper 4. Patient being discharged on 2 L nasal cannula oxygen. She'll follow-up with her PCP to determine if this is to be continued indefinitely 5. New medication: OxyIR 5 mg one every 6 hours as needed for spinal headache following lumbar puncture  Discharge Diagnoses:  Active Hospital Problems   Diagnosis Date Noted  . COPD exacerbation 01/19/2015  . Overweight (BMI 25.0-29.9) 01/22/2015  . Acute respiratory failure with hypoxia 01/19/2015  . 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 Essential hypertension 01/19/2015  . Chronic pain syndrome 01/19/2015  . Migraine 07/17/2007  . Hyperlipidemia 03/22/2007    Resolved Hospital Problems   Diagnosis Date Noted Date Resolved  No resolved problems to display.    Discharge Condition: Improved, being discharged home  Diet recommendation: Heart healthy  Filed Weights   01/19/15 2013  Weight: 72.258 kg (159 lb 4.8 oz)    History of present illness:  59 year old female past oral history of COPD and hypertension. [facility] complaining of fevers chills and intermittent migraine headaches along with shortness of breath and wheezing and noted to be hypoxic. CT scan of the head and lumbar puncture negative for findings. Patient transferred to [facility] after adrenal gland thickening noted.  Hospital Course:  Principal Problem:   Influenza/COPD exacerbation with acute respiratory failure with hypoxia: Patient treated with  steroids, nebulizers, antibiotics and supplemental oxygen. Patient's influenza A titer positive. Started on Tamiflu. She completed a full antibiotic course. By day of discharge, she was breathing somewhat stable on room air, but with ambulation, her oxygen saturations dropped to 84%. Nasal cannula oxygen at 2 L is being set up for her at home. She'll follow-up with her PCP to determine if the studies to be continued indefinitely. She will also be discharged on a steroid taper +3 more doses of Tamiflu Active Problems:    Migraine/spinal headache: Patient evaluated at [facility] initially and underwent spinal tap. Since that time she's had problems with spinal headaches. Received a prescription for pain medication on discharge      Adrenal abnormality: Adrenal enhancement seen on CT scan. Recommendation is for outpatient MRI with and without gadolinium for further imaging. This we set up by her PCP.   Essential hypertension: Continue home meds   Chronic pain syndrome   Overweight (BMI 25.0-29.9): Patient is criteria with BMI greater than 25   Procedures:  Lumbar puncture done prior to admission at [facility]  Consultations:  None  Discharge Exam: BP 145/75 mmHg  Pulse 99  Temp(Src) 98.5 F (36.9 C) (Oral)  Resp 17  Ht  (1.626 m)  Wt 72.258 kg (159 lb 4.8 oz)  BMI 27.33 kg/m2  SpO2 90%  General: Alert and oriented 3, no acute distress Cardiovascular: Regular rate and rhythm, S1-S2 Respiratory: Decreased breath sounds throughout  Discharge Instructions You were cared for by a hospitalist during your hospital stay. If you have any questions about your discharge medications or the care you received while you were in the hospital after you are discharged,  you can call the unit and asked to speak with the hospitalist on call if the hospitalist that took care of you is not available. Once you are discharged, your primary care physician will handle any further medical  issues. Please note that NO REFILLS for any discharge medications will be authorized once you are discharged, as it is imperative that you return to your primary care physician (or establish a relationship with a primary care physician if you do not have one) for your aftercare needs so that they can reassess your need for medications and monitor your lab values.  Discharge Instructions    Diet - low sodium heart healthy    Complete by:  As directed      Increase activity slowly    Complete by:  As directed             Medication List    TAKE these medications        albuterol (2.5 MG/3ML) 0.083% nebulizer solution  Commonly known as:  PROVENTIL  Take 2.5 mg by nebulization every 6 (six) hours as needed for wheezing or shortness of breath.     albuterol 108 (90 BASE) MCG/ACT inhaler  Commonly known as:  PROVENTIL HFA;VENTOLIN HFA  Inhale 2 puffs into the lungs 3 (three) times daily as needed for wheezing or shortness of breath.     diazepam 10 MG tablet  Commonly known as:  VALIUM  Take 10 mg by mouth 2 (two) times daily.     diphenoxylate-atropine 2.5-0.025 MG per tablet  Commonly known as:  LOMOTIL  Take 1-2 tablets by mouth 3 (three) times daily as needed for diarrhea or loose stools (for bowel spasms).     EPIPEN IJ  Inject 0.3 mg as directed as needed (Bee Stings).     HYDROcodone-acetaminophen 7.5-325 MG per tablet  Commonly known as:  NORCO  Take 1 tablet by mouth 3 (three) times daily.     hyoscyamine 0.125 MG SL tablet  Commonly known as:  LEVSIN SL  Take 0.125-0.25 mg by mouth every 4 (four) hours as needed for cramping.     lisinopril 20 MG tablet  Commonly known as:  PRINIVIL,ZESTRIL  Take 20 mg by mouth daily.     oseltamivir 75 MG capsule  Commonly known as:  TAMIFLU  Take 1 capsule (75 mg total) by mouth 2 (two) times daily.     oxyCODONE 5 MG immediate release tablet  Commonly known as:  Oxy IR/ROXICODONE  Take 1 tablet (5 mg total) by mouth every 6  (six) hours as needed for moderate pain.     predniSONE 10 MG tablet  Commonly known as:  DELTASONE  40 mg po bid x 1 day, then 40mg  daily x 1 day, then 30mg  daily x 1 day, then 20mg  daily x 1 day, then 10mg  x 1 day     promethazine 25 MG tablet  Commonly known as:  PHENERGAN  Take 12.5 mg by mouth every 8 (eight) hours as needed for nausea or vomiting.     promethazine 25 MG suppository  Commonly known as:  PHENERGAN  Place 12.5 mg rectally every 8 (eight) hours as needed for nausea or vomiting.        Allergies  Allergen Reactions  . Acetaminophen     Will not take after Cyanide deaths, something changed when they mkt'd again.  Can take Childrens Liquid but sometimes upset stomach too  . Aspirin     Makes anemic  .  Fentanyl And Related     fever  . Lisinopril     Can only take her own mfr.    . Midazolam Hcl     fever  . Prednisone     REACTION: ibs probs  . Propofol     REACTION: FEVER  . Statins     BP increases  . Sulfamethoxazole-Trimethoprim     REACTION: antibiotic induced colitis      The results of significant diagnostics from this hospitalization (including imaging, microbiology, ancillary and laboratory) are listed below for reference.    Significant Diagnostic Studies: Ct Abdomen Wo Contrast  Feb 17, 2015   CLINICAL DATA:  Chronic upper abdominal pain for 2 years.  EXAM: CT ABDOMEN WITHOUT CONTRAST  TECHNIQUE: Multidetector CT imaging of the abdomen was performed following the standard protocol without IV contrast.  COMPARISON:  Chest CT of January 19, 2015. CT scan of abdomen of June 11, 2014.  FINDINGS: No gallstones are noted. There does not appear to be any definite evidence of adrenal mass or hemorrhage. There is noted increased stranding of the fat superior to the right kidney which may represent focal inflammation. No hydronephrosis or renal obstruction is noted. Residual contrast is noted within the intrarenal collecting system bilaterally.  Atherosclerotic calcifications of the abdominal aorta are noted without aneurysm formation. No focal abnormality is noted in the liver or spleen on these unenhanced images. As noted on prior CT exam, solid appearance of pancreatic head is noted and further evaluation with MRI is recommended. There is no evidence of bowel obstruction. The appendix appears visualize an within normal limits. Diverticulosis of the visualized colon is noted without inflammation.  IMPRESSION: Adrenal glands appear to be unremarkable and unchanged compared to prior exam of June 11, 2014. There is no evidence of adrenal mass or hemorrhage.  There is noted slightly increased inflammatory changes in the pararenal fat superior to the right kidney suggesting focal inflammation.  Prominence of the pancreatic head is again noted as described on prior chest CT scan, and further evaluation with MRI with and without gadolinium according to pancreatic protocol is recommended on nonemergent basis.   Electronically Signed   By: Lupita Raider, M.D.   On: 17-Feb-2015 13:52    Microbiology: No results found for this or any previous visit (from the past 240 hour(s)).   Labs: Basic Metabolic Panel:  Recent Labs Lab 2015/02/17 0636 01/21/15 0805  NA 133* 137  K 4.7 4.9  CL 99 99  CO2 28 31  GLUCOSE 161* 130*  BUN 9 14  CREATININE 0.69 0.70  CALCIUM 8.5 8.5   Liver Function Tests:  Recent Labs Lab 01/21/15 0805  AST 33  ALT 16  ALKPHOS 57  BILITOT 0.7  PROT 6.6  ALBUMIN 3.7   No results for input(s): LIPASE, AMYLASE in the last 168 hours. No results for input(s): AMMONIA in the last 168 hours. CBC:  Recent Labs Lab 02-17-2015 0636 01/21/15 0805  WBC 6.7 9.9  HGB 14.7 14.6  HCT 43.3 45.0  MCV 92.1 93.8  PLT 189 206   Cardiac Enzymes: No results for input(s): CKTOTAL, CKMB, CKMBINDEX, TROPONINI in the last 168 hours. BNP: BNP (last 3 results) No results for input(s): BNP in the last 8760 hours.  ProBNP (last  3 results) No results for input(s): PROBNP in the last 8760 hours.  CBG: No results for input(s): GLUCAP in the last 168 hours.     Signed:  Hollice Espy  [facility] 01/23/2015, 4:51  PM

## 2015-01-23 NOTE — Progress Notes (Signed)
Pt given discharge instructions and PIV removed.  Pt denied any needs at this time.  Pt taken to discharge location via wheelchair. 

## 2015-01-23 NOTE — Care Management Note (Signed)
    Page 1 of 1   01/23/2015     4:02:02 PM CARE MANAGEMENT NOTE 01/23/2015  Patient:  Rickman,Charlesa L   Account Number:  [id number]  Date Initiated:  01/23/2015  Documentation initiated by:  Letha CapeAYLOR,Athea Haley  Subjective/Objective Assessment:   dx copd ex  admit-from home.     Action/Plan:   Anticipated DC Date:  01/23/2015   Anticipated DC Plan:  HOME/SELF CARE      DC Planning Services  CM consult      PAC Choice  DURABLE MEDICAL EQUIPMENT   Choice offered to / List presented to:  C-1 Patient   DME arranged  OXYGEN      DME agency  [facility]        Status of service:  Completed, signed off Medicare Important Message given?  NO (If response is "NO", the following Medicare IM given date fields will be blank) Date Medicare IM given:   Medicare IM given by:   Date Additional Medicare IM given:   Additional Medicare IM given by:    Discharge Disposition:  HOME/SELF CARE  Per UR Regulation:  Reviewed for med. necessity/level of care/duration of stay  If discussed at Long Length of Stay Meetings, dates discussed:    Comments:  01/23/15 1558 Letha Capeeborah Emelynn Rance RN, BSN [phone] patient is for dc today, will need home oxygen.  [facility]CM contacted Jermaine and he will bring oxygen up to patient's room and facilitate the delivery to patient's home.

## 2016-04-22 DIAGNOSIS — IMO0001 Reserved for inherently not codable concepts without codable children: Secondary | ICD-10-CM | POA: Insufficient documentation

## 2016-04-22 DIAGNOSIS — R002 Palpitations: Secondary | ICD-10-CM | POA: Insufficient documentation

## 2016-04-22 DIAGNOSIS — F17200 Nicotine dependence, unspecified, uncomplicated: Secondary | ICD-10-CM | POA: Insufficient documentation

## 2016-04-29 DIAGNOSIS — I25118 Atherosclerotic heart disease of native coronary artery with other forms of angina pectoris: Secondary | ICD-10-CM | POA: Insufficient documentation

## 2016-04-29 DIAGNOSIS — I25110 Atherosclerotic heart disease of native coronary artery with unstable angina pectoris: Secondary | ICD-10-CM | POA: Insufficient documentation

## 2016-06-14 DIAGNOSIS — I701 Atherosclerosis of renal artery: Secondary | ICD-10-CM | POA: Insufficient documentation

## 2016-12-11 DIAGNOSIS — R109 Unspecified abdominal pain: Secondary | ICD-10-CM

## 2016-12-11 DIAGNOSIS — I2510 Atherosclerotic heart disease of native coronary artery without angina pectoris: Secondary | ICD-10-CM | POA: Diagnosis not present

## 2016-12-11 DIAGNOSIS — D72829 Elevated white blood cell count, unspecified: Secondary | ICD-10-CM | POA: Diagnosis not present

## 2016-12-11 DIAGNOSIS — R7989 Other specified abnormal findings of blood chemistry: Secondary | ICD-10-CM | POA: Diagnosis not present

## 2016-12-11 DIAGNOSIS — I160 Hypertensive urgency: Secondary | ICD-10-CM | POA: Diagnosis not present

## 2016-12-12 DIAGNOSIS — R109 Unspecified abdominal pain: Secondary | ICD-10-CM | POA: Diagnosis not present

## 2016-12-12 DIAGNOSIS — R7989 Other specified abnormal findings of blood chemistry: Secondary | ICD-10-CM | POA: Diagnosis not present

## 2016-12-12 DIAGNOSIS — I160 Hypertensive urgency: Secondary | ICD-10-CM | POA: Diagnosis not present

## 2016-12-12 DIAGNOSIS — I2510 Atherosclerotic heart disease of native coronary artery without angina pectoris: Secondary | ICD-10-CM | POA: Diagnosis not present

## 2017-05-31 ENCOUNTER — Encounter: Payer: Self-pay | Admitting: Cardiology

## 2017-05-31 ENCOUNTER — Ambulatory Visit (INDEPENDENT_AMBULATORY_CARE_PROVIDER_SITE_OTHER): Payer: BLUE CROSS/BLUE SHIELD | Admitting: Cardiology

## 2017-05-31 VITALS — BP 158/82 | HR 97 | Ht 63.0 in | Wt 141.1 lb

## 2017-05-31 DIAGNOSIS — Z951 Presence of aortocoronary bypass graft: Secondary | ICD-10-CM | POA: Diagnosis not present

## 2017-05-31 DIAGNOSIS — I25118 Atherosclerotic heart disease of native coronary artery with other forms of angina pectoris: Secondary | ICD-10-CM | POA: Diagnosis not present

## 2017-05-31 DIAGNOSIS — I10 Essential (primary) hypertension: Secondary | ICD-10-CM

## 2017-05-31 DIAGNOSIS — I701 Atherosclerosis of renal artery: Secondary | ICD-10-CM

## 2017-05-31 DIAGNOSIS — F17200 Nicotine dependence, unspecified, uncomplicated: Secondary | ICD-10-CM

## 2017-05-31 DIAGNOSIS — R002 Palpitations: Secondary | ICD-10-CM | POA: Diagnosis not present

## 2017-05-31 DIAGNOSIS — E782 Mixed hyperlipidemia: Secondary | ICD-10-CM | POA: Diagnosis not present

## 2017-05-31 NOTE — Progress Notes (Signed)
Cardiology Office Note:    Date:  05/31/2017   ID:  [name], DOB 1956-05-11, MRN [id number]  PCP:  Gordan Payment., MD  Cardiologist:  Garwin Brothers, MD   Referring MD: Gordan Payment., MD    ASSESSMENT:    1. Palpitations   2. Essential hypertension   3. Coronary artery disease of native artery of native heart with stable angina pectoris (HCC)   4. Renal artery stenosis, non-flow-limiting (HCC)   5. Mixed hyperlipidemia   6. TOBACCO ABUSE   7. Hx of CABG    PLAN:    In order of problems listed above:  1. I discussed with the patient about her palpitations. She recently had blood work done by her nephrologist and we will try to obtain a copy. In view of her palpitations I will see her TSH was done and also a 48 hour Holter monitor will be obtained. If she has significant tachycardia and I will try to initiate her on calcium channel blocker she has history of bronchial asthma. 2. Blood pressure stable and we will continue to monitor this. It has always been a challenge for her to a pressure and optimal. Progressive blood-pressure lowering sometimes drops of blood pressure. 3. Lipids are followed by primary care physician. She is not agreeable to any lipid lowering medications because of very bad side effects. I respect her wishes diet was discussed. I spent 5 minutes with the patient discussing solely about smoking. Smoking cessation was counseled. I suggested to the patient also different medications and pharmacological interventions. Patient is keen to try stopping on its own at this time. He will get back to me if he needs any further assistance in this matter.Patient will be seen in follow-up appointment in 1 months or earlier if the patient has any concerns. Total time for evaluation was 40 minutes.   Medication Adjustments/Labs and Tests Ordered: Current medicines are reviewed at length with the patient today.  Concerns regarding medicines are outlined above.    Orders Placed This Encounter  Procedures  . Holter monitor - 48 hour   No orders of the defined types were placed in this encounter.   Next History of Present Illness:    [name] is a 61 y.o. female who is being seen today for the evaluation oPalpitations at the request of Gordan Payment., MD. Patient is a pleasant 61 year old female. She has been evaluated by me at the previous practice. She is here to transfer her care and get established. She has known coronary artery disease, she tells me that she is now her prediabetic. She has history of essential hypertension and dyslipidemia and unfortunately continues to smoke significantly more than half pack a day. She tells me that she is concerned about the palpitations she has almost on a daily basis. She has been evaluated for this in the past. She mentions to me that multiple doctors have done blood work including thyroid and it has been found that her thyroid has been unremarkable. This is a single most biggest concern at this time. She denies any chest pain orthopnea or PND. She leads a sedentary lifestyle. She is not on any lipid-lowering medication including injectable once because of the fact that she tells me that she does not tolerate them at all.  Past Medical History:  Diagnosis Date  . Asthma   . Benign essential HTN   . COPD (chronic obstructive pulmonary disease) (HCC)   . IBS (irritable bowel syndrome)   .  Migraine headache     History reviewed. No pertinent surgical history.  Current Medications: Current Meds  Medication Sig  . albuterol (PROVENTIL HFA;VENTOLIN HFA) 108 (90 BASE) MCG/ACT inhaler Inhale 2 puffs into the lungs 3 (three) times daily as needed for wheezing or shortness of breath.  Marland Kitchen. albuterol (PROVENTIL) (2.5 MG/3ML) 0.083% nebulizer solution Take 2.5 mg by nebulization every 6 (six) hours as needed for wheezing or shortness of breath.  Marland Kitchen. aspirin 81 MG chewable tablet Chew 81 mg by mouth daily.  .  cloNIDine (CATAPRES) 0.1 MG tablet Take 0.1 mg by mouth 2 (two) times daily.  . diphenoxylate-atropine (LOMOTIL) 2.5-0.025 MG per tablet Take 1-2 tablets by mouth 3 (three) times daily as needed for diarrhea or loose stools (for bowel spasms).  . EPINEPHrine (EPIPEN IJ) Inject 0.3 mg as directed as needed (Bee Stings).  . hyoscyamine (LEVSIN SL) 0.125 MG SL tablet Take 0.125-0.25 mg by mouth every 4 (four) hours as needed for cramping.  Marland Kitchen. lisinopril (PRINIVIL,ZESTRIL) 20 MG tablet Take 20 mg by mouth 2 (two) times daily.   . promethazine (PHENERGAN) 25 MG suppository Place 12.5 mg rectally every 8 (eight) hours as needed for nausea or vomiting.  . ranitidine (ZANTAC) 150 MG tablet Take 150 mg by mouth daily as needed.  . Tiotropium Bromide-Olodaterol (STIOLTO RESPIMAT) 2.5-2.5 MCG/ACT AERS Inhale 2 puffs into the lungs daily.  . [DISCONTINUED] promethazine (PHENERGAN) 25 MG tablet Take 12.5 mg by mouth every 8 (eight) hours as needed for nausea or vomiting.     Allergies:   Acetaminophen; Aspirin; Fentanyl and related; Lisinopril; Midazolam hcl; Prednisone; Propofol; Statins; and Sulfamethoxazole-trimethoprim   Social History   Social History  . Marital status: Married    Spouse name: N/A  . Number of children: N/A  . Years of education: N/A   Social History Main Topics  . Smoking status: Heavy Tobacco Smoker    Packs/day: 1.00    Types: Cigarettes  . Smokeless tobacco: Never Used  . Alcohol use 0.0 oz/week     Comment: Occasional to Rare  . Drug use: No  . Sexual activity: Not Asked   Other Topics Concern  . None   Social History Narrative  . None     Family History: The patient's family history includes CAD in her brother; Cancer - Other in her father; Hypertension in her mother.  ROS:   Please see the history of present illness.    All other systems reviewed and are negative.  EKGs/Labs/Other Studies Reviewed:    The following studies were reviewed today: I reviewed  records extensively from previous doctor visits including lipids and had a lengthy discussion with her about the same.   Recent Labs: No results found for requested labs within last 8760 hours.  Recent Lipid Panel    Component Value Date/Time   CHOL 230 (H) 10/24/2009 1152   TRIG 146.0 10/24/2009 1152   HDL 39.30 10/24/2009 1152   CHOLHDL 6 10/24/2009 1152   VLDL 29.2 10/24/2009 1152   LDLDIRECT 173.8 10/24/2009 1152    Physical Exam:    VS:  BP (!) 158/82   Pulse 97   Ht 5\' 3"  (1.6 m)   Wt 141 lb 1.3 oz (64 kg)   SpO2 98%   BMI 24.99 kg/m     Wt Readings from Last 3 Encounters:  05/31/17 141 lb 1.3 oz (64 kg)  01/19/15 159 lb 4.8 oz (72.3 kg)  07/10/10 135 lb (61.2 kg)  GEN: Patient is in no acute distress HEENT: Normal NECK: No JVD; No carotid bruits LYMPHATICS: No lymphadenopathy CARDIAC: S1 S2 regular, 2/6 systolic murmur at the apex. RESPIRATORY:  Clear to auscultation without rales, wheezing or rhonchi  ABDOMEN: Soft, non-tender, non-distended MUSCULOSKELETAL:  No edema; No deformity  SKIN: Warm and dry NEUROLOGIC:  Alert and oriented x 3 PSYCHIATRIC:  Normal affect    Signed, Garwin Brothers, MD  05/31/2017 9:22 AM    [facility]

## 2017-05-31 NOTE — Patient Instructions (Addendum)
Medication Instructions:  Your physician recommends that you continue on your current medications as directed. Please refer to the Current Medication list given to you today.  Labwork: None   Testing/Procedures: Your physician has recommended that you wear a holter monitor. Holter monitors are medical devices that record the heart's electrical activity. Doctors most often use these monitors to diagnose arrhythmias. Arrhythmias are problems with the speed or rhythm of the heartbeat. The monitor is a small, portable device. You can wear one while you do your normal daily activities. This is usually used to diagnose what is causing palpitations/syncope (passing out).  Please arrive at [address], [place], [place] [id number] for your appointment.    Follow-Up: Your physician recommends that you schedule a follow-up appointment in: 1 month   Any Other Special Instructions Will Be Listed Below (If Applicable).  Please note that any paperwork needing to be filled out by the provider will need to be addressed at the front desk prior to seeing the provider. Please note that any paperwork FMLA, Disability or other documents regarding health condition is subject to a $25.00 charge that must be received prior to completion of paperwork.    If you need a refill on your cardiac medications before your next appointment, please call your pharmacy.

## 2017-07-06 ENCOUNTER — Ambulatory Visit: Payer: BLUE CROSS/BLUE SHIELD | Admitting: Cardiology

## 2017-07-22 DIAGNOSIS — I2510 Atherosclerotic heart disease of native coronary artery without angina pectoris: Secondary | ICD-10-CM | POA: Diagnosis not present

## 2017-07-22 DIAGNOSIS — E871 Hypo-osmolality and hyponatremia: Secondary | ICD-10-CM | POA: Diagnosis not present

## 2017-07-22 DIAGNOSIS — R109 Unspecified abdominal pain: Secondary | ICD-10-CM | POA: Diagnosis not present

## 2017-07-22 DIAGNOSIS — R112 Nausea with vomiting, unspecified: Secondary | ICD-10-CM | POA: Diagnosis not present

## 2017-07-23 DIAGNOSIS — I10 Essential (primary) hypertension: Secondary | ICD-10-CM | POA: Diagnosis not present

## 2017-07-23 DIAGNOSIS — R109 Unspecified abdominal pain: Secondary | ICD-10-CM | POA: Diagnosis not present

## 2017-07-23 DIAGNOSIS — R112 Nausea with vomiting, unspecified: Secondary | ICD-10-CM | POA: Diagnosis not present

## 2017-07-23 DIAGNOSIS — I2510 Atherosclerotic heart disease of native coronary artery without angina pectoris: Secondary | ICD-10-CM | POA: Diagnosis not present

## 2017-07-23 DIAGNOSIS — E871 Hypo-osmolality and hyponatremia: Secondary | ICD-10-CM | POA: Diagnosis not present

## 2017-08-02 ENCOUNTER — Other Ambulatory Visit: Payer: Self-pay | Admitting: Orthopedic Surgery

## 2017-08-02 DIAGNOSIS — M4150 Other secondary scoliosis, site unspecified: Principal | ICD-10-CM

## 2017-08-15 ENCOUNTER — Ambulatory Visit
Admission: RE | Admit: 2017-08-15 | Discharge: 2017-08-15 | Disposition: A | Discharge status: Home / Self Care | Payer: BLUE CROSS/BLUE SHIELD | Source: Ambulatory Visit | Attending: Orthopedic Surgery | Admitting: Orthopedic Surgery

## 2017-08-15 DIAGNOSIS — M4150 Other secondary scoliosis, site unspecified: Principal | ICD-10-CM

## 2017-09-15 ENCOUNTER — Other Ambulatory Visit: Payer: Self-pay | Admitting: Orthopaedic Surgery

## 2017-09-15 DIAGNOSIS — Q762 Congenital spondylolisthesis: Secondary | ICD-10-CM

## 2017-09-29 ENCOUNTER — Ambulatory Visit
Admission: RE | Admit: 2017-09-29 | Discharge: 2017-09-29 | Disposition: A | Discharge status: Home / Self Care | Payer: BLUE CROSS/BLUE SHIELD | Source: Ambulatory Visit | Attending: Orthopaedic Surgery | Admitting: Orthopaedic Surgery

## 2017-09-29 DIAGNOSIS — Q762 Congenital spondylolisthesis: Secondary | ICD-10-CM

## 2018-01-16 ENCOUNTER — Telehealth: Payer: Self-pay | Admitting: Family Medicine

## 2018-01-16 NOTE — Telephone Encounter (Signed)
Copied from CRM [phone]. Topic: Appointment Scheduling - Scheduling Inquiry for Clinic >> Jan 16, 2018 10:46 AM Arlyss Gandyichardson, Taren N, NT wrote: Reason for CRM: Pt would like to start seeing Dr. Tawanna Coolerodd again. Last seen in 2011. Please schedule pt if approved by Dr. Tawanna Coolerodd.  I spoke with patient, said husband is a patient with Dr. Tawanna Coolerodd and provider approved this, will forward note for approval.

## 2018-01-24 NOTE — Telephone Encounter (Signed)
Called notified pt that due to Dr Tawanna Coolerodd limited schedule dr Tawanna Coolerodd is not accepting new pt.

## 2018-01-24 NOTE — Telephone Encounter (Signed)
Harriett SineNancy please call because of my limited schedule I feel she should stay with her current PCP

## 2018-08-17 DIAGNOSIS — R7989 Other specified abnormal findings of blood chemistry: Secondary | ICD-10-CM

## 2018-08-17 DIAGNOSIS — R251 Tremor, unspecified: Secondary | ICD-10-CM

## 2018-08-17 DIAGNOSIS — I10 Essential (primary) hypertension: Secondary | ICD-10-CM

## 2018-08-17 DIAGNOSIS — I2510 Atherosclerotic heart disease of native coronary artery without angina pectoris: Secondary | ICD-10-CM

## 2018-08-17 DIAGNOSIS — D72829 Elevated white blood cell count, unspecified: Secondary | ICD-10-CM

## 2018-08-17 DIAGNOSIS — R400 Somnolence: Secondary | ICD-10-CM | POA: Diagnosis not present

## 2018-08-18 DIAGNOSIS — I10 Essential (primary) hypertension: Secondary | ICD-10-CM | POA: Diagnosis not present

## 2018-08-18 DIAGNOSIS — R251 Tremor, unspecified: Secondary | ICD-10-CM | POA: Diagnosis not present

## 2018-08-18 DIAGNOSIS — I2510 Atherosclerotic heart disease of native coronary artery without angina pectoris: Secondary | ICD-10-CM | POA: Diagnosis not present

## 2018-08-18 DIAGNOSIS — R400 Somnolence: Secondary | ICD-10-CM | POA: Diagnosis not present

## 2020-02-21 ENCOUNTER — Emergency Department: Payer: 59

## 2020-02-21 ENCOUNTER — Emergency Department
Admission: EM | Admit: 2020-02-21 | Discharge: 2020-02-21 | Disposition: A | Discharge status: Home / Self Care | Payer: 59 | Attending: Student in an Organized Health Care Education/Training Program | Admitting: Student in an Organized Health Care Education/Training Program

## 2020-02-21 ENCOUNTER — Other Ambulatory Visit: Payer: Self-pay

## 2020-02-21 DIAGNOSIS — Z79899 Other long term (current) drug therapy: Secondary | ICD-10-CM | POA: Insufficient documentation

## 2020-02-21 DIAGNOSIS — R519 Headache, unspecified: Secondary | ICD-10-CM | POA: Insufficient documentation

## 2020-02-21 DIAGNOSIS — R1031 Right lower quadrant pain: Secondary | ICD-10-CM | POA: Diagnosis not present

## 2020-02-21 DIAGNOSIS — R110 Nausea: Secondary | ICD-10-CM | POA: Diagnosis present

## 2020-02-21 DIAGNOSIS — F17210 Nicotine dependence, cigarettes, uncomplicated: Secondary | ICD-10-CM | POA: Diagnosis not present

## 2020-02-21 DIAGNOSIS — I10 Essential (primary) hypertension: Secondary | ICD-10-CM | POA: Insufficient documentation

## 2020-02-21 DIAGNOSIS — R42 Dizziness and giddiness: Secondary | ICD-10-CM | POA: Insufficient documentation

## 2020-02-21 DIAGNOSIS — J449 Chronic obstructive pulmonary disease, unspecified: Secondary | ICD-10-CM | POA: Insufficient documentation

## 2020-02-21 LAB — CBC
HCT: 42.8 % (ref 36.0–46.0)
Hemoglobin: 14 g/dL (ref 12.0–15.0)
MCH: 28.9 pg (ref 26.0–34.0)
MCHC: 32.7 g/dL (ref 30.0–36.0)
MCV: 88.4 fL (ref 80.0–100.0)
Platelets: 375 10*3/uL (ref 150–400)
RBC: 4.84 MIL/uL (ref 3.87–5.11)
RDW: 13.8 % (ref 11.5–15.5)
WBC: 16.2 10*3/uL — ABNORMAL HIGH (ref 4.0–10.5)
nRBC: 0 % (ref 0.0–0.2)

## 2020-02-21 LAB — TROPONIN I (HIGH SENSITIVITY): Troponin I (High Sensitivity): 9 ng/L (ref <18)

## 2020-02-21 LAB — COMPREHENSIVE METABOLIC PANEL
ALT: 11 U/L (ref 0–44)
AST: 15 U/L (ref 15–41)
Albumin: 4.2 g/dL (ref 3.5–5.0)
Alkaline Phosphatase: 95 U/L (ref 38–126)
Anion gap: 7 (ref 5–15)
BUN: 11 mg/dL (ref 8–23)
CO2: 29 mmol/L (ref 22–32)
Calcium: 9.5 mg/dL (ref 8.9–10.3)
Chloride: 102 mmol/L (ref 98–111)
Creatinine, Ser: 0.8 mg/dL (ref 0.44–1.00)
GFR calc Af Amer: >60 mL/min (ref >60)
GFR calc non Af Amer: >60 mL/min (ref >60)
Glucose, Bld: 115 mg/dL — ABNORMAL HIGH (ref 70–99)
Potassium: 4 mmol/L (ref 3.5–5.1)
Sodium: 138 mmol/L (ref 135–145)
Total Bilirubin: 0.5 mg/dL (ref 0.3–1.2)
Total Protein: 7.5 g/dL (ref 6.5–8.1)

## 2020-02-21 LAB — URINALYSIS, COMPLETE (UACMP) WITH MICROSCOPIC
Bacteria, UA: NONE SEEN
Bilirubin Urine: NEGATIVE
Glucose, UA: NEGATIVE mg/dL
Hgb urine dipstick: NEGATIVE
Ketones, ur: NEGATIVE mg/dL
Leukocytes,Ua: NEGATIVE
Nitrite: NEGATIVE
Protein, ur: NEGATIVE mg/dL
Specific Gravity, Urine: 1.018 (ref 1.005–1.030)
pH: 6 (ref 5.0–8.0)

## 2020-02-21 LAB — LIPASE, BLOOD: Lipase: 21 U/L (ref 11–51)

## 2020-02-21 MED ORDER — MECLIZINE HCL 25 MG PO TABS
25.0000 mg | ORAL_TABLET | Freq: Three times a day (TID) | ORAL | 1 refills | Status: DC | PRN
Start: 2020-02-21 — End: 2020-03-22

## 2020-02-21 MED ORDER — MECLIZINE HCL 25 MG PO TABS
25.0000 mg | ORAL_TABLET | Freq: Once | ORAL | Status: AC
  Administered 2020-02-21: 25 mg via ORAL
  Filled 2020-02-21: qty 1

## 2020-02-21 MED ORDER — PROCHLORPERAZINE EDISYLATE 10 MG/2ML IJ SOLN
10.0000 mg | Freq: Once | INTRAMUSCULAR | Status: AC
  Administered 2020-02-21: 18:00:00 10 mg via INTRAVENOUS
  Filled 2020-02-21: qty 2

## 2020-02-21 MED ORDER — AMLODIPINE BESYLATE 5 MG PO TABS
5.0000 mg | ORAL_TABLET | Freq: Every day | ORAL | 0 refills | Status: DC
Start: 2020-02-21 — End: 2020-03-31

## 2020-02-21 MED ORDER — DIPHENHYDRAMINE HCL 50 MG/ML IJ SOLN
12.5000 mg | Freq: Once | INTRAMUSCULAR | Status: AC
  Administered 2020-02-21: 12.5 mg via INTRAVENOUS
  Filled 2020-02-21: qty 1

## 2020-02-21 MED ORDER — IOHEXOL 300 MG/ML  SOLN
100.0000 mL | Freq: Once | INTRAMUSCULAR | Status: AC | PRN
  Administered 2020-02-21: 100 mL via INTRAVENOUS

## 2020-02-21 MED ORDER — PROMETHAZINE HCL 12.5 MG PO TABS: 12.5000 mg | ORAL_TABLET | Freq: Four times a day (QID) | ORAL | 0 refills | Status: DC | PRN

## 2020-02-21 MED ORDER — SODIUM CHLORIDE 0.9 % IV BOLUS
500.0000 mL | Freq: Once | INTRAVENOUS | Status: AC
  Administered 2020-02-21: 500 mL via INTRAVENOUS

## 2020-02-21 NOTE — Discharge Instructions (Signed)

## 2020-02-21 NOTE — ED Provider Notes (Signed)
[facility] Emergency Department Provider Note    None    (approximate)  I have reviewed the triage vital signs and the nursing notes.   HISTORY  Chief Complaint Dizziness and Hypertension    HPI [name] is a 64 y.o. female below listed past medical history presents to the ER for abdominal pain as well as headache.  Is having some nausea.  No measured fevers.  She is also concerned about elevated blood pressure and feels like she is dizzy.  Does have a history of migraine headaches.  Have is a right-sided headache today.  Is not the worst headache of her life it was not sudden onset.  No neck stiffness.  No numbness or tingling.  Does have primarily right-sided abdominal pain which is new.  Denies any dysuria.  No flank pain.  Does have a history of C. difficile but this does not feel like that she not having diarrhea.    Past Medical History:  Diagnosis Date  . Asthma   . Benign essential HTN   . COPD (chronic obstructive pulmonary disease) ([place])   . IBS (irritable bowel syndrome)   . Migraine headache    Family History  Problem Relation Age of Onset  . Hypertension Mother   . CAD Brother        Deceased on MI age 5  . Cancer - Other Father        Oropharyngeal SCCa   History reviewed. No pertinent surgical history. Patient Active Problem List   Diagnosis Date Noted  . 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 Renal artery stenosis, non-flow-limiting ([place]) 06/14/2016  . Coronary artery disease of native artery of native heart with stable angina pectoris ([place]) 04/29/2016  . Palpitations 04/22/2016  . Overweight (BMI 25.0-29.9) 01/22/2015  . COPD exacerbation ([place]) 01/19/2015  . Acute respiratory failure with hypoxia ([place]) 01/19/2015  . 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 Essential hypertension 01/19/2015  . Chronic pain syndrome 01/19/2015  . ACUTE CYSTITIS 04/30/2010  . GASTROINTESTINAL XRAY, ABNORMAL 01/05/2010  . GERD 12/25/2009  . BREAST  MASS 10/24/2009  . ANXIETY DEPRESSION 09/17/2009  . WEIGHT LOSS 09/17/2009  . ABDOMINAL PAIN -GENERALIZED 09/03/2009  . CLOSTRIDIUM DIFFICILE COLITIS 06/16/2009  . MICROSCOPIC HEMATURIA 06/09/2009  . BACK PAIN, LUMBAR 06/09/2009  . PERIPHERAL NEUROPATHY 05/28/2009  . CONTUSION, HIP, RIGHT 05/28/2009  . SEBACEOUS CYST 02/13/2009  . BACK PAIN, CHRONIC 02/13/2009  . DYSPLASTIC NEVUS, BACK 09/18/2008  . ANXIETY 08/28/2008  . 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 OTHER DYSPHAGIA 10/30/2007  . IRRITABLE BOWEL SYNDROME 09/29/2007  . TOBACCO ABUSE 07/17/2007  . Migraine 07/17/2007  . DISEASE, HYPERTENSIVE HEART NOS, W/O HF 05/25/2007  . Hyperlipidemia 03/22/2007  . PERIPHERAL VASCULAR DISEASE 03/22/2007  . ASTHMA 03/22/2007  . DIVERTICULOSIS, COLON 03/22/2007      Prior to Admission medications   Medication Sig Start Date End Date Taking? Authorizing Provider  albuterol (PROVENTIL HFA;VENTOLIN HFA) 108 (90 BASE) MCG/ACT inhaler Inhale 2 puffs into the lungs 3 (three) times daily as needed for wheezing or shortness of breath.    [provider]  albuterol (PROVENTIL) (2.5 MG/3ML) 0.083% nebulizer solution Take 2.5 mg by nebulization every 6 (six) hours as needed for wheezing or shortness of breath.    [provider]  amLODipine (NORVASC) 5 MG tablet Take 1 tablet (5 mg total) by mouth daily. 02/21/20 02/20/21  Merlyn Lot, MD  aspirin 81 MG chewable  tablet Chew 81 mg by mouth daily. 05/04/16   [provider]  cloNIDine (CATAPRES) 0.1 MG tablet Take 0.1 mg by mouth 2 (two) times daily. 05/05/17   [provider]  diphenoxylate-atropine (LOMOTIL) 2.5-0.025 MG per tablet Take 1-2 tablets by mouth 3 (three) times daily as needed for diarrhea or loose stools (for bowel spasms).    [provider]  EPINEPHrine (EPIPEN IJ) Inject 0.3 mg as directed as needed ([place]).    [provider]  hyoscyamine (LEVSIN SL) 0.125 MG SL tablet Take 0.125-0.25 mg by mouth every 4 (four) hours as needed for cramping.    [provider]  lisinopril (PRINIVIL,ZESTRIL) 20 MG tablet Take 20 mg by mouth 2 (two) times daily.     [provider]  promethazine (PHENERGAN) 12.5 MG tablet Take 1 tablet (12.5 mg total) by mouth every 6 (six) hours as needed. 02/21/20   Willy Eddy, MD  promethazine (PHENERGAN) 25 MG suppository Place 12.5 mg rectally every 8 (eight) hours as needed for nausea or vomiting.    [provider]  ranitidine (ZANTAC) 150 MG tablet Take 150 mg by mouth daily as needed. 05/05/17   [provider]  Tiotropium Bromide-Olodaterol (STIOLTO RESPIMAT) 2.5-2.5 MCG/ACT AERS Inhale 2 puffs into the lungs daily. 02/07/17   [provider]    Allergies Acetaminophen, Aspirin, Beta adrenergic blockers, Fentanyl and related, Lisinopril, Midazolam hcl, Prednisone, Propofol, Statins, and Sulfamethoxazole-trimethoprim    Social History Social History   Tobacco Use  . Smoking status: Heavy Tobacco Smoker    Packs/day: 1.00    Types: Cigarettes  . Smokeless tobacco: Never Used  Substance Use Topics  . Alcohol use: Yes    Alcohol/week: 0.0 standard drinks    Comment: Occasional to Rare  . Drug use: No    Review of Systems Patient denies headaches, rhinorrhea, blurry vision, numbness, shortness of breath, chest pain, edema, cough, abdominal pain, nausea, vomiting, diarrhea, dysuria, fevers, rashes or hallucinations unless otherwise stated above in HPI. ____________________________________________   PHYSICAL EXAM:  VITAL SIGNS: Vitals:   02/21/20 1738 02/21/20 1740  BP: (!) 167/108 (!) 169/84  Pulse: 83   Resp: 16   Temp:    SpO2: 97%     Constitutional: Alert and oriented.  Eyes: Conjunctivae are normal.  Head: Atraumatic. Nose: No congestion/rhinnorhea. Mouth/Throat: Mucous membranes are moist.   Neck: No  stridor. Painless ROM.  Cardiovascular: Normal rate, regular rhythm. Grossly normal heart sounds.  Good peripheral circulation. Respiratory: Normal respiratory effort.  No retractions. Lungs CTAB. Gastrointestinal: Soft, obese with mild rightsided abd pain.  No guarding or rebound. No distention. No abdominal bruits. No CVA tenderness. Genitourinary: deferred Musculoskeletal: No lower extremity tenderness nor edema.  No joint effusions. Neurologic:  Normal speech and language. No gross focal neurologic deficits are appreciated. No facial droop Skin:  Skin is warm, dry and intact. No rash noted. Psychiatric: Mood and affect are normal. Speech and behavior are normal.  ____________________________________________   LABS (all labs ordered are listed, but only abnormal results are displayed)  Results for orders placed or performed during the hospital encounter of 02/21/20 (from the past 24 hour(s))  Lipase, blood     Status: None   Collection Time: 02/21/20 12:32 PM  Result Value Ref Range   Lipase 21 11 - 51 U/L  Comprehensive metabolic panel     Status: Abnormal   Collection Time: 02/21/20 12:32 PM  Result Value Ref Range   Sodium 138 135 -  145 mmol/L   Potassium 4.0 3.5 - 5.1 mmol/L   Chloride 102 98 - 111 mmol/L   CO2 29 22 - 32 mmol/L   Glucose, Bld 115 (H) 70 - 99 mg/dL   BUN 11 8 - 23 mg/dL   Creatinine, Ser 8.28 0.44 - 1.00 mg/dL   Calcium 9.5 8.9 - 00.3 mg/dL   Total Protein 7.5 6.5 - 8.1 g/dL   Albumin 4.2 3.5 - 5.0 g/dL   AST 15 15 - 41 U/L   ALT 11 0 - 44 U/L   Alkaline Phosphatase 95 38 - 126 U/L   Total Bilirubin 0.5 0.3 - 1.2 mg/dL   GFR calc non Af Amer >60 >60 mL/min   GFR calc Af Amer >60 >60 mL/min   Anion gap 7 5 - 15  CBC     Status: Abnormal   Collection Time: 02/21/20 12:32 PM  Result Value Ref Range   WBC 16.2 (H) 4.0 - 10.5 K/uL   RBC 4.84 3.87 - 5.11 MIL/uL   Hemoglobin 14.0 12.0 - 15.0 g/dL   HCT 49.1 79.1 - 50.5 %   MCV 88.4 80.0 - 100.0 fL    MCH 28.9 26.0 - 34.0 pg   MCHC 32.7 30.0 - 36.0 g/dL   RDW 69.7 94.8 - 01.6 %   Platelets 375 150 - 400 K/uL   nRBC 0.0 0.0 - 0.2 %  Troponin I (High Sensitivity)     Status: None   Collection Time: 02/21/20 12:32 PM  Result Value Ref Range   Troponin I (High Sensitivity) 9 <18 ng/L  Urinalysis, Complete w Microscopic     Status: Abnormal   Collection Time: 02/21/20  6:49 PM  Result Value Ref Range   Color, Urine STRAW (A) YELLOW   APPearance CLEAR (A) CLEAR   Specific Gravity, Urine 1.018 1.005 - 1.030   pH 6.0 5.0 - 8.0   Glucose, UA NEGATIVE NEGATIVE mg/dL   Hgb urine dipstick NEGATIVE NEGATIVE   Bilirubin Urine NEGATIVE NEGATIVE   Ketones, ur NEGATIVE NEGATIVE mg/dL   Protein, ur NEGATIVE NEGATIVE mg/dL   Nitrite NEGATIVE NEGATIVE   Leukocytes,Ua NEGATIVE NEGATIVE   RBC / HPF 0-5 0 - 5 RBC/hpf   WBC, UA 0-5 0 - 5 WBC/hpf   Bacteria, UA NONE SEEN NONE SEEN   Squamous Epithelial / LPF 0-5 0 - 5   ____________________________________________  EKG My review and personal interpretation at Time: 12:33   Indication: naussea/htn  Rate: 90  Rhythm: sinus Axis: normal Other: normal intervals, no stemi, nonspecific st abn ____________________________________________  RADIOLOGY  I personally reviewed all radiographic images ordered to evaluate for the above acute complaints and reviewed radiology reports and findings.  These findings were personally discussed with the patient.  Please see medical record for radiology report.  ____________________________________________   PROCEDURES  Procedure(s) performed:  Procedures    Critical Care performed: no ____________________________________________   INITIAL IMPRESSION / ASSESSMENT AND PLAN / ED COURSE  Pertinent labs & imaging results that were available during my care of the patient were reviewed by me and considered in my medical decision making (see chart for details).   DDX: Enteritis, gastritis, SBO, appendicitis,  cholelithiasis, gastritis, pneumonia, ACS  [name] is a 64 y.o. who presents to the ED with symptoms as described above.  Patient is nontoxic-appearing.  Does have some tenderness on the right side of her abdomen given mild white count will order CT abdomen and pelvis.  Will give some  IV fluids as well as symptomatic management.  Her neuro exam is benign.  Do not feel that neuroimaging clinically indicated.  She denies any chest pain or shortness of breath at this time but will order x-ray as well as EKG and cardiac enzyme.  Her EKG is nonischemic.  Does not seem consistent with ACS.  Doubt pericarditis.  The patient will be placed on continuous pulse oximetry and telemetry for monitoring.  Laboratory evaluation will be sent to evaluate for the above complaints.     Clinical Course as of Feb 21 2047  Thu Feb 21, 2020  1921 CT imaging results reviewed with patient.  Her repeat abdominal exam is soft and benign.  She is tolerating p.o.  Discussed need for outpatient follow-up with GI for endoscopy.  Patient requesting referral local referral as her current GI physicians not taking her insurance anymore.  She denies any melena or hematochezia.  She takes Protonix currently.  She feels currently better after fluids and medication here in the ER.  I do believe she stable and appropriate for outpatient follow-up.   [PR]    Clinical Course User Index [PR] Willy Eddy, MD    The patient was evaluated in Emergency Department today for the symptoms described in the history of present illness. He/she was evaluated in the context of the global COVID-19 pandemic, which necessitated consideration that the patient might be at risk for infection with the SARS-CoV-2 virus that causes COVID-19. Institutional protocols and algorithms that pertain to the evaluation of patients at risk for COVID-19 are in a state of rapid change based on information released by regulatory bodies including the CDC and  federal and state organizations. These policies and algorithms were followed during the patient's care in the ED.  As part of my medical decision making, I reviewed the following data within the electronic MEDICAL RECORD NUMBER Nursing notes reviewed and incorporated, Labs reviewed, notes from prior ED visits and [place] Controlled Substance Database   ____________________________________________   FINAL CLINICAL IMPRESSION(S) / ED DIAGNOSES  Final diagnoses:  Nausea  Hypertension, unspecified type      NEW MEDICATIONS STARTED DURING THIS VISIT:  New Prescriptions   AMLODIPINE (NORVASC) 5 MG TABLET    Take 1 tablet (5 mg total) by mouth daily.   PROMETHAZINE (PHENERGAN) 12.5 MG TABLET    Take 1 tablet (12.5 mg total) by mouth every 6 (six) hours as needed.     Note:  This document was prepared using Dragon voice recognition software and may include unintentional dictation errors.    Willy Eddy, MD 02/21/20 2048

## 2020-02-21 NOTE — ED Triage Notes (Addendum)
Pt states for the past month she has "felt weird" and has been feeling light headed- pt states she has been having high blood pressure as well- pt states her head is also hurting on the top on the right side- pt states she also has "gut pain" that she believes in contributing to her high BP- pt states she has had cdiff twice

## 2020-03-22 ENCOUNTER — Other Ambulatory Visit: Payer: Self-pay

## 2020-03-22 ENCOUNTER — Emergency Department: Payer: 59

## 2020-03-22 ENCOUNTER — Inpatient Hospital Stay
Admission: EM | Admit: 2020-03-22 | Discharge: 2020-03-26 | DRG: 872 | Disposition: A | Discharge status: Home / Self Care | Payer: 59 | Attending: Internal Medicine | Admitting: Internal Medicine

## 2020-03-22 DIAGNOSIS — Z20822 Contact with and (suspected) exposure to covid-19: Secondary | ICD-10-CM | POA: Diagnosis present

## 2020-03-22 DIAGNOSIS — T886XXA Anaphylactic reaction due to adverse effect of correct drug or medicament properly administered, initial encounter: Secondary | ICD-10-CM | POA: Diagnosis present

## 2020-03-22 DIAGNOSIS — J440 Chronic obstructive pulmonary disease with acute lower respiratory infection: Secondary | ICD-10-CM | POA: Diagnosis present

## 2020-03-22 DIAGNOSIS — Z7982 Long term (current) use of aspirin: Secondary | ICD-10-CM

## 2020-03-22 DIAGNOSIS — I5032 Chronic diastolic (congestive) heart failure: Secondary | ICD-10-CM | POA: Diagnosis present

## 2020-03-22 DIAGNOSIS — T782XXA Anaphylactic shock, unspecified, initial encounter: Secondary | ICD-10-CM | POA: Diagnosis not present

## 2020-03-22 DIAGNOSIS — T782XXS Anaphylactic shock, unspecified, sequela: Secondary | ICD-10-CM | POA: Diagnosis not present

## 2020-03-22 DIAGNOSIS — I739 Peripheral vascular disease, unspecified: Secondary | ICD-10-CM | POA: Diagnosis present

## 2020-03-22 DIAGNOSIS — E1151 Type 2 diabetes mellitus with diabetic peripheral angiopathy without gangrene: Secondary | ICD-10-CM | POA: Diagnosis present

## 2020-03-22 DIAGNOSIS — I700 Atherosclerosis of aorta: Secondary | ICD-10-CM | POA: Diagnosis present

## 2020-03-22 DIAGNOSIS — I25110 Atherosclerotic heart disease of native coronary artery with unstable angina pectoris: Secondary | ICD-10-CM

## 2020-03-22 DIAGNOSIS — R7989 Other specified abnormal findings of blood chemistry: Secondary | ICD-10-CM

## 2020-03-22 DIAGNOSIS — Z79899 Other long term (current) drug therapy: Secondary | ICD-10-CM

## 2020-03-22 DIAGNOSIS — I10 Essential (primary) hypertension: Secondary | ICD-10-CM | POA: Diagnosis present

## 2020-03-22 DIAGNOSIS — I160 Hypertensive urgency: Secondary | ICD-10-CM | POA: Diagnosis present

## 2020-03-22 DIAGNOSIS — Z90710 Acquired absence of both cervix and uterus: Secondary | ICD-10-CM

## 2020-03-22 DIAGNOSIS — N189 Chronic kidney disease, unspecified: Secondary | ICD-10-CM | POA: Diagnosis present

## 2020-03-22 DIAGNOSIS — E663 Overweight: Secondary | ICD-10-CM | POA: Diagnosis present

## 2020-03-22 DIAGNOSIS — E785 Hyperlipidemia, unspecified: Secondary | ICD-10-CM | POA: Diagnosis present

## 2020-03-22 DIAGNOSIS — Z951 Presence of aortocoronary bypass graft: Secondary | ICD-10-CM

## 2020-03-22 DIAGNOSIS — Z90722 Acquired absence of ovaries, bilateral: Secondary | ICD-10-CM | POA: Diagnosis not present

## 2020-03-22 DIAGNOSIS — A419 Sepsis, unspecified organism: Secondary | ICD-10-CM | POA: Diagnosis present

## 2020-03-22 DIAGNOSIS — E1165 Type 2 diabetes mellitus with hyperglycemia: Secondary | ICD-10-CM | POA: Diagnosis present

## 2020-03-22 DIAGNOSIS — K449 Diaphragmatic hernia without obstruction or gangrene: Secondary | ICD-10-CM | POA: Diagnosis present

## 2020-03-22 DIAGNOSIS — R600 Localized edema: Secondary | ICD-10-CM | POA: Diagnosis not present

## 2020-03-22 DIAGNOSIS — I479 Paroxysmal tachycardia, unspecified: Secondary | ICD-10-CM

## 2020-03-22 DIAGNOSIS — G43909 Migraine, unspecified, not intractable, without status migrainosus: Secondary | ICD-10-CM | POA: Diagnosis not present

## 2020-03-22 DIAGNOSIS — I472 Ventricular tachycardia: Secondary | ICD-10-CM | POA: Diagnosis not present

## 2020-03-22 DIAGNOSIS — Z882 Allergy status to sulfonamides status: Secondary | ICD-10-CM

## 2020-03-22 DIAGNOSIS — Z6825 Body mass index (BMI) 25.0-25.9, adult: Secondary | ICD-10-CM

## 2020-03-22 DIAGNOSIS — E876 Hypokalemia: Secondary | ICD-10-CM | POA: Diagnosis present

## 2020-03-22 DIAGNOSIS — Z8249 Family history of ischemic heart disease and other diseases of the circulatory system: Secondary | ICD-10-CM

## 2020-03-22 DIAGNOSIS — M5137 Other intervertebral disc degeneration, lumbosacral region: Secondary | ICD-10-CM | POA: Diagnosis present

## 2020-03-22 DIAGNOSIS — T361X5A Adverse effect of cephalosporins and other beta-lactam antibiotics, initial encounter: Secondary | ICD-10-CM | POA: Diagnosis present

## 2020-03-22 DIAGNOSIS — J45901 Unspecified asthma with (acute) exacerbation: Secondary | ICD-10-CM | POA: Diagnosis present

## 2020-03-22 DIAGNOSIS — Z808 Family history of malignant neoplasm of other organs or systems: Secondary | ICD-10-CM | POA: Diagnosis not present

## 2020-03-22 DIAGNOSIS — Y92239 Unspecified place in hospital as the place of occurrence of the external cause: Secondary | ICD-10-CM | POA: Diagnosis present

## 2020-03-22 DIAGNOSIS — M51379 Other intervertebral disc degeneration, lumbosacral region without mention of lumbar back pain or lower extremity pain: Secondary | ICD-10-CM | POA: Diagnosis present

## 2020-03-22 DIAGNOSIS — I25118 Atherosclerotic heart disease of native coronary artery with other forms of angina pectoris: Secondary | ICD-10-CM | POA: Diagnosis present

## 2020-03-22 DIAGNOSIS — Z716 Tobacco abuse counseling: Secondary | ICD-10-CM

## 2020-03-22 DIAGNOSIS — F419 Anxiety disorder, unspecified: Secondary | ICD-10-CM | POA: Diagnosis present

## 2020-03-22 DIAGNOSIS — J209 Acute bronchitis, unspecified: Secondary | ICD-10-CM | POA: Diagnosis present

## 2020-03-22 DIAGNOSIS — I130 Hypertensive heart and chronic kidney disease with heart failure and stage 1 through stage 4 chronic kidney disease, or unspecified chronic kidney disease: Secondary | ICD-10-CM | POA: Diagnosis present

## 2020-03-22 DIAGNOSIS — F17210 Nicotine dependence, cigarettes, uncomplicated: Secondary | ICD-10-CM | POA: Diagnosis present

## 2020-03-22 DIAGNOSIS — J449 Chronic obstructive pulmonary disease, unspecified: Secondary | ICD-10-CM | POA: Diagnosis not present

## 2020-03-22 DIAGNOSIS — D72829 Elevated white blood cell count, unspecified: Secondary | ICD-10-CM | POA: Diagnosis not present

## 2020-03-22 DIAGNOSIS — Z888 Allergy status to other drugs, medicaments and biological substances status: Secondary | ICD-10-CM

## 2020-03-22 DIAGNOSIS — J441 Chronic obstructive pulmonary disease with (acute) exacerbation: Secondary | ICD-10-CM | POA: Diagnosis present

## 2020-03-22 DIAGNOSIS — M5136 Other intervertebral disc degeneration, lumbar region: Secondary | ICD-10-CM | POA: Diagnosis present

## 2020-03-22 DIAGNOSIS — J4521 Mild intermittent asthma with (acute) exacerbation: Secondary | ICD-10-CM | POA: Diagnosis not present

## 2020-03-22 DIAGNOSIS — Z886 Allergy status to analgesic agent status: Secondary | ICD-10-CM

## 2020-03-22 DIAGNOSIS — Z794 Long term (current) use of insulin: Secondary | ICD-10-CM

## 2020-03-22 DIAGNOSIS — R109 Unspecified abdominal pain: Secondary | ICD-10-CM

## 2020-03-22 DIAGNOSIS — T380X5A Adverse effect of glucocorticoids and synthetic analogues, initial encounter: Secondary | ICD-10-CM | POA: Diagnosis not present

## 2020-03-22 DIAGNOSIS — R000 Tachycardia, unspecified: Secondary | ICD-10-CM | POA: Diagnosis not present

## 2020-03-22 DIAGNOSIS — R0602 Shortness of breath: Secondary | ICD-10-CM | POA: Diagnosis not present

## 2020-03-22 DIAGNOSIS — I2510 Atherosclerotic heart disease of native coronary artery without angina pectoris: Secondary | ICD-10-CM | POA: Diagnosis not present

## 2020-03-22 LAB — URINALYSIS, COMPLETE (UACMP) WITH MICROSCOPIC
Bacteria, UA: NONE SEEN
Bilirubin Urine: NEGATIVE
Glucose, UA: >=500 mg/dL — AB
Ketones, ur: NEGATIVE mg/dL
Leukocytes,Ua: NEGATIVE
Nitrite: NEGATIVE
Protein, ur: NEGATIVE mg/dL
Specific Gravity, Urine: 1.011 (ref 1.005–1.030)
pH: 6 (ref 5.0–8.0)

## 2020-03-22 LAB — CBC WITH DIFFERENTIAL/PLATELET
Abs Immature Granulocytes: 0.29 10*3/uL — ABNORMAL HIGH (ref 0.00–0.07)
Basophils Absolute: 0.1 10*3/uL (ref 0.0–0.1)
Basophils Relative: 0 %
Eosinophils Absolute: 0 10*3/uL (ref 0.0–0.5)
Eosinophils Relative: 0 %
HCT: 41.3 % (ref 36.0–46.0)
Hemoglobin: 13.7 g/dL (ref 12.0–15.0)
Immature Granulocytes: 1 %
Lymphocytes Relative: 6 %
Lymphs Abs: 1.7 10*3/uL (ref 0.7–4.0)
MCH: 28.1 pg (ref 26.0–34.0)
MCHC: 33.2 g/dL (ref 30.0–36.0)
MCV: 84.8 fL (ref 80.0–100.0)
Monocytes Absolute: 1.9 10*3/uL — ABNORMAL HIGH (ref 0.1–1.0)
Monocytes Relative: 6 %
Neutro Abs: 25.5 10*3/uL — ABNORMAL HIGH (ref 1.7–7.7)
Neutrophils Relative %: 87 %
Platelets: 525 10*3/uL — ABNORMAL HIGH (ref 150–400)
RBC: 4.87 MIL/uL (ref 3.87–5.11)
RDW: 13.2 % (ref 11.5–15.5)
Smear Review: NORMAL
WBC: 29.5 10*3/uL — ABNORMAL HIGH (ref 4.0–10.5)
nRBC: 0 % (ref 0.0–0.2)

## 2020-03-22 LAB — COMPREHENSIVE METABOLIC PANEL
ALT: 18 U/L (ref 0–44)
AST: 40 U/L (ref 15–41)
Albumin: 4.3 g/dL (ref 3.5–5.0)
Alkaline Phosphatase: 121 U/L (ref 38–126)
Anion gap: 15 (ref 5–15)
BUN: 10 mg/dL (ref 8–23)
CO2: 24 mmol/L (ref 22–32)
Calcium: 9.5 mg/dL (ref 8.9–10.3)
Chloride: 97 mmol/L — ABNORMAL LOW (ref 98–111)
Creatinine, Ser: 0.86 mg/dL (ref 0.44–1.00)
GFR calc Af Amer: >60 mL/min (ref >60)
GFR calc non Af Amer: >60 mL/min (ref >60)
Glucose, Bld: 248 mg/dL — ABNORMAL HIGH (ref 70–99)
Potassium: 2.9 mmol/L — ABNORMAL LOW (ref 3.5–5.1)
Sodium: 136 mmol/L (ref 135–145)
Total Bilirubin: 0.6 mg/dL (ref 0.3–1.2)
Total Protein: 8 g/dL (ref 6.5–8.1)

## 2020-03-22 LAB — LACTIC ACID, PLASMA
Lactic Acid, Venous: 4.3 mmol/L (ref 0.5–1.9)
Lactic Acid, Venous: 2.1 mmol/L (ref 0.5–1.9)

## 2020-03-22 LAB — TROPONIN I (HIGH SENSITIVITY)
Troponin I (High Sensitivity): 31 ng/L — ABNORMAL HIGH (ref <18)
Troponin I (High Sensitivity): 42 ng/L — ABNORMAL HIGH (ref <18)

## 2020-03-22 LAB — PROTIME-INR
INR: 1 (ref 0.8–1.2)
Prothrombin Time: 13 seconds (ref 11.4–15.2)

## 2020-03-22 LAB — SARS CORONAVIRUS 2 BY RT PCR (HOSPITAL ORDER, PERFORMED IN ~~LOC~~ HOSPITAL LAB): SARS Coronavirus 2: NEGATIVE

## 2020-03-22 LAB — BRAIN NATRIURETIC PEPTIDE: B Natriuretic Peptide: 255.4 pg/mL — ABNORMAL HIGH (ref 0.0–100.0)

## 2020-03-22 MED ORDER — FAMOTIDINE IN NACL 20-0.9 MG/50ML-% IV SOLN
20.0000 mg | Freq: Two times a day (BID) | INTRAVENOUS | Status: DC
  Administered 2020-03-23: 20 mg via INTRAVENOUS
  Filled 2020-03-22: qty 50

## 2020-03-22 MED ORDER — LACTATED RINGERS IV BOLUS (SEPSIS)
1000.0000 mL | Freq: Once | INTRAVENOUS | Status: AC
  Administered 2020-03-22: 1000 mL via INTRAVENOUS

## 2020-03-22 MED ORDER — VANCOMYCIN HCL 500 MG/100ML IV SOLN
500.0000 mg | Freq: Once | INTRAVENOUS | Status: AC
  Administered 2020-03-22: 500 mg via INTRAVENOUS
  Filled 2020-03-22: qty 100

## 2020-03-22 MED ORDER — EPINEPHRINE 0.3 MG/0.3ML IJ SOAJ
0.3000 mg | Freq: Once | INTRAMUSCULAR | Status: AC
  Administered 2020-03-22: 0.3 mg via INTRAMUSCULAR

## 2020-03-22 MED ORDER — LEVOFLOXACIN IN D5W 750 MG/150ML IV SOLN
750.0000 mg | INTRAVENOUS | Status: DC
  Administered 2020-03-23 (×2): 750 mg via INTRAVENOUS
  Filled 2020-03-22 (×3): qty 150

## 2020-03-22 MED ORDER — ZOLPIDEM TARTRATE 5 MG PO TABS
5.0000 mg | ORAL_TABLET | Freq: Every evening | ORAL | Status: DC | PRN
  Administered 2020-03-23 – 2020-03-25 (×3): 5 mg via ORAL
  Filled 2020-03-22 (×3): qty 1

## 2020-03-22 MED ORDER — AMLODIPINE BESYLATE 5 MG PO TABS
5.0000 mg | ORAL_TABLET | Freq: Two times a day (BID) | ORAL | Status: DC
  Administered 2020-03-23: 5 mg via ORAL
  Filled 2020-03-22 (×3): qty 1

## 2020-03-22 MED ORDER — METHYLPREDNISOLONE SODIUM SUCC 125 MG IJ SOLR
125.0000 mg | Freq: Once | INTRAMUSCULAR | Status: AC
  Administered 2020-03-22: 125 mg via INTRAVENOUS

## 2020-03-22 MED ORDER — GUAIFENESIN ER 600 MG PO TB12
600.0000 mg | ORAL_TABLET | Freq: Two times a day (BID) | ORAL | Status: DC
  Administered 2020-03-23 – 2020-03-25 (×5): 600 mg via ORAL
  Filled 2020-03-22 (×5): qty 1

## 2020-03-22 MED ORDER — FUROSEMIDE 10 MG/ML IJ SOLN
40.0000 mg | Freq: Two times a day (BID) | INTRAMUSCULAR | Status: DC
  Administered 2020-03-23 (×2): 40 mg via INTRAVENOUS
  Filled 2020-03-22 (×2): qty 4

## 2020-03-22 MED ORDER — FAMOTIDINE IN NACL 20-0.9 MG/50ML-% IV SOLN
20.0000 mg | Freq: Once | INTRAVENOUS | Status: AC
  Administered 2020-03-22: 20 mg via INTRAVENOUS

## 2020-03-22 MED ORDER — LEVALBUTEROL HCL 0.63 MG/3ML IN NEBU
0.6300 mg | INHALATION_SOLUTION | Freq: Four times a day (QID) | RESPIRATORY_TRACT | Status: DC
  Administered 2020-03-23 (×2): 0.63 mg via RESPIRATORY_TRACT
  Filled 2020-03-22 (×3): qty 3

## 2020-03-22 MED ORDER — INSULIN ASPART 100 UNIT/ML ~~LOC~~ SOLN
0.0000 [IU] | Freq: Three times a day (TID) | SUBCUTANEOUS | Status: DC
  Administered 2020-03-23: 3 [IU] via SUBCUTANEOUS
  Administered 2020-03-23: 11 [IU] via SUBCUTANEOUS
  Administered 2020-03-23 – 2020-03-24 (×2): 8 [IU] via SUBCUTANEOUS
  Administered 2020-03-24 (×2): 5 [IU] via SUBCUTANEOUS
  Administered 2020-03-24: 8 [IU] via SUBCUTANEOUS
  Administered 2020-03-25: 3 [IU] via SUBCUTANEOUS
  Administered 2020-03-25: 8 [IU] via SUBCUTANEOUS
  Administered 2020-03-25 – 2020-03-26 (×3): 3 [IU] via SUBCUTANEOUS
  Filled 2020-03-22 (×11): qty 1

## 2020-03-22 MED ORDER — NITROGLYCERIN IN D5W 200-5 MCG/ML-% IV SOLN
INTRAVENOUS | Status: AC
  Administered 2020-03-22: 5 ug/min via INTRAVENOUS
  Filled 2020-03-22: qty 250

## 2020-03-22 MED ORDER — DIPHENHYDRAMINE HCL 50 MG/ML IJ SOLN
25.0000 mg | Freq: Four times a day (QID) | INTRAMUSCULAR | Status: DC
  Administered 2020-03-23: 25 mg via INTRAVENOUS
  Filled 2020-03-22 (×2): qty 1

## 2020-03-22 MED ORDER — ENOXAPARIN SODIUM 40 MG/0.4ML ~~LOC~~ SOLN
40.0000 mg | SUBCUTANEOUS | Status: DC
  Administered 2020-03-23: 40 mg via SUBCUTANEOUS
  Filled 2020-03-22 (×2): qty 0.4

## 2020-03-22 MED ORDER — ONDANSETRON HCL 4 MG/2ML IJ SOLN
INTRAMUSCULAR | Status: AC
  Administered 2020-03-22: 4 mg
  Filled 2020-03-22: qty 2

## 2020-03-22 MED ORDER — ALPRAZOLAM 0.25 MG PO TABS
0.2500 mg | ORAL_TABLET | Freq: Two times a day (BID) | ORAL | Status: DC | PRN
  Administered 2020-03-23: 0.25 mg via ORAL
  Filled 2020-03-22: qty 1

## 2020-03-22 MED ORDER — ONDANSETRON HCL 4 MG/2ML IJ SOLN
4.0000 mg | Freq: Four times a day (QID) | INTRAMUSCULAR | Status: DC | PRN
  Administered 2020-03-25 – 2020-03-26 (×2): 4 mg via INTRAVENOUS
  Filled 2020-03-22 (×2): qty 2

## 2020-03-22 MED ORDER — ASPIRIN 81 MG PO CHEW
81.0000 mg | CHEWABLE_TABLET | Freq: Every day | ORAL | Status: DC
  Administered 2020-03-23 – 2020-03-26 (×5): 81 mg via ORAL
  Filled 2020-03-22 (×5): qty 1

## 2020-03-22 MED ORDER — IPRATROPIUM BROMIDE 0.02 % IN SOLN
0.5000 mg | Freq: Four times a day (QID) | RESPIRATORY_TRACT | Status: DC
  Administered 2020-03-23: 0.5 mg via RESPIRATORY_TRACT
  Filled 2020-03-22 (×2): qty 2.5

## 2020-03-22 MED ORDER — DIPHENHYDRAMINE HCL 50 MG/ML IJ SOLN
50.0000 mg | Freq: Once | INTRAMUSCULAR | Status: AC
  Administered 2020-03-22: 50 mg via INTRAVENOUS

## 2020-03-22 MED ORDER — SODIUM CHLORIDE 0.9% FLUSH: 3.0000 mL | INTRAVENOUS | Status: DC | PRN

## 2020-03-22 MED ORDER — HYDRALAZINE HCL 50 MG PO TABS: 50.0000 mg | ORAL_TABLET | Freq: Four times a day (QID) | ORAL | Status: DC | PRN

## 2020-03-22 MED ORDER — HYDROCOD POLST-CPM POLST ER 10-8 MG/5ML PO SUER: 5.0000 mL | Freq: Two times a day (BID) | ORAL | Status: DC | PRN

## 2020-03-22 MED ORDER — IOHEXOL 300 MG/ML  SOLN
100.0000 mL | Freq: Once | INTRAMUSCULAR | Status: AC | PRN
  Administered 2020-03-22: 100 mL via INTRAVENOUS

## 2020-03-22 MED ORDER — SODIUM CHLORIDE 0.9% FLUSH
3.0000 mL | Freq: Two times a day (BID) | INTRAVENOUS | Status: DC
  Administered 2020-03-23 – 2020-03-26 (×8): 3 mL via INTRAVENOUS

## 2020-03-22 MED ORDER — HYOSCYAMINE SULFATE 0.125 MG SL SUBL: 0.1250 mg | SUBLINGUAL_TABLET | SUBLINGUAL | Status: DC | PRN

## 2020-03-22 MED ORDER — METHYLPREDNISOLONE SODIUM SUCC 40 MG IJ SOLR
40.0000 mg | Freq: Three times a day (TID) | INTRAMUSCULAR | Status: DC
  Administered 2020-03-23 – 2020-03-24 (×4): 40 mg via INTRAVENOUS
  Filled 2020-03-22 (×4): qty 1

## 2020-03-22 MED ORDER — SODIUM CHLORIDE 0.9 % IV SOLN
2.0000 g | Freq: Once | INTRAVENOUS | Status: AC
  Administered 2020-03-22: 2 g via INTRAVENOUS
  Filled 2020-03-22: qty 2

## 2020-03-22 MED ORDER — NITROGLYCERIN 2 % TD OINT: 1.0000 [in_us] | TOPICAL_OINTMENT | Freq: Four times a day (QID) | TRANSDERMAL | Status: DC | PRN

## 2020-03-22 MED ORDER — FLUTICASONE PROPIONATE 50 MCG/ACT NA SUSP
1.0000 | Freq: Every day | NASAL | Status: DC | PRN
  Filled 2020-03-22 (×2): qty 16

## 2020-03-22 MED ORDER — LABETALOL HCL 5 MG/ML IV SOLN
INTRAVENOUS | Status: AC
  Filled 2020-03-22: qty 4

## 2020-03-22 MED ORDER — VANCOMYCIN HCL IN DEXTROSE 1-5 GM/200ML-% IV SOLN
1000.0000 mg | Freq: Once | INTRAVENOUS | Status: AC
  Administered 2020-03-22: 1000 mg via INTRAVENOUS
  Filled 2020-03-22: qty 200

## 2020-03-22 MED ORDER — LACTATED RINGERS IV BOLUS (SEPSIS)
250.0000 mL | Freq: Once | INTRAVENOUS | Status: AC
  Administered 2020-03-22: 250 mL via INTRAVENOUS

## 2020-03-22 MED ORDER — SODIUM CHLORIDE 0.9 % IV SOLN: 25.0000 mg | Freq: Four times a day (QID) | INTRAVENOUS | Status: DC

## 2020-03-22 MED ORDER — SODIUM CHLORIDE 0.9 % IV SOLN: 250.0000 mL | INTRAVENOUS | Status: DC | PRN

## 2020-03-22 MED ORDER — PANTOPRAZOLE SODIUM 40 MG PO TBEC
40.0000 mg | DELAYED_RELEASE_TABLET | Freq: Every day | ORAL | Status: DC
  Administered 2020-03-23 – 2020-03-26 (×4): 40 mg via ORAL
  Filled 2020-03-22 (×4): qty 1

## 2020-03-22 NOTE — ED Notes (Signed)
This tech changed blue absorbant pad under pt, replaced purewick and repositioned pt. Pt currently watching tv  Now.

## 2020-03-22 NOTE — H&P (Addendum)
[place] at [facility]   PATIENT NAME: [name]    MR#:  [id number]  DATE OF BIRTH:  [birth year]  DATE OF ADMISSION:  03/22/2020  PRIMARY CARE PHYSICIAN: Patient, No Pcp Per   REQUESTING/REFERRING PHYSICIAN: Marge Duncans, MD  CHIEF COMPLAINT:   Chief Complaint  Patient presents with  . Shortness of Breath    HISTORY OF PRESENT ILLNESS:  [name]  is a 64 y.o. Caucasian female with a known history of asthma and COPD, Hypertension, coronary artery disease status post four-vessel CABG and migraine, who presented to the emergency room with acute onset of chest tightness with mild dyspnea and wheezing without significant cough since last week with occasional chest pain when she.  She has been recently having low-grade fever that was up to 99.4 still earlier today when it went up to 100 and maxed at one 1.4 with associated tachycardia while laying down.  She denied any orthopnea or dyspnea on exertion.  She has been having occasional paroxysmal nocturnal dyspnea.  She admitted to bilateral pedal edema lately.  She complains of mild neck pain as well as headache without dizziness however with occasional blurred vision.  No dysuria, oliguria or hematuria or flank pain.  She has been having occasional sore throat.  No rhinorrhea or nasal congestion.  Upon presentation to the emergency room temperature was 99 and later 100, blood pressure 206/102 with a heart rate of 105 and later 124 with otherwise normal vital signs.   Labs revealed hypokalemia of 2.9 and hyperglycemia of 248 and elevated BNP of 255.4, high-sensitivity troponin I of 31 and later 42.  Lactic acid was 4.3 and later 2.1.  CBC showed leukocytosis of 29.5 with neutrophilia of 87%.  Urinalysis showed more than 500 glucose otherwise was unremarkable.  COVID-19 PCR came back negative.  The patient stated that she has not been vaccinated yet.  Blood cultures were drawn.  Chest x-ray showed no acute cardiopulmonary  disease.  Chest CT showed marked severe calcification of the aortic arch with no other acute abnormalities.  Abdominal and pelvic CT scan showed small to moderate sized hiatal hernia and noninflamed diverticula throughout the large bowel and hepatic small cyst or hemangioma, grade 1 anterolisthesis of L4 on L5, aortic atherosclerosis and mild to moderate severity multilevel degenerative changes throughout the lumbar spine most prominent at L1-L2.  The patient was given 2 g of IV cefepime and after that developed anaphylaxis with dyspnea and wheezing for which he was given 0.3 mg IM epinephrine, 50 mg IV Benadryl and 20 mg IV Pepcid as well as 125 mg of IV Solu-Medrol.  He was given 2.25 mL IV lactated Ringer bolus and IV vancomycin.  Was given nebulized albuterol that led to significant elevation of her blood pressure.  She stated that she tolerates MDI fairly well.  She will be admitted to a cardiac progressive unit bed for further evaluation and management.   PAST MEDICAL HISTORY:   Past Medical History:  Diagnosis Date  . Asthma   . Benign essential HTN   . COPD (chronic obstructive pulmonary disease) (HCC)   . IBS (irritable bowel syndrome)   . Migraine headache   Coronary artery disease status post four-vessel CABG.  PAST SURGICAL HISTORY:  -Four-vessel CABG -Total abdominal hysterectomy and bilateral salpingo-oophorectomy -Incisional herniorrhaphy -Umbilical herniorrhaphy SOCIAL HISTORY:   Social History   Tobacco Use  . Smoking status: Heavy Tobacco Smoker    Packs/day: 1.00    Types: Cigarettes  .  Smokeless tobacco: Never Used  Substance Use Topics  . Alcohol use: Yes    Alcohol/week: 0.0 standard drinks    Comment: Occasional to Rare    FAMILY HISTORY:   Family History  Problem Relation Age of Onset  . Hypertension Mother   . CAD Brother        Deceased on MI age 38  . Cancer - Other Father        Oropharyngeal SCCa    DRUG ALLERGIES:   Allergies  Allergen  Reactions  . Cefepime Anaphylaxis, Shortness Of Breath, Swelling and Dermatitis  . Edoxaban Other (See Comments)    Causes hematomas  . Acetaminophen     Will not take after Cyanide deaths, something changed when they mkt'd again.  Can take Childrens Liquid but sometimes upset stomach too  . Aspirin     Makes anemic  . Beta Adrenergic Blockers     Paradoxical reactions  . Fentanyl And Related     fever  . Lisinopril     Can only take her own mfr.    . Midazolam Hcl     fever  . Prednisone     REACTION: ibs probs  . Propofol     REACTION: FEVER  . Statins     BP increases  . Sulfamethoxazole-Trimethoprim     REACTION: antibiotic induced colitis    REVIEW OF SYSTEMS:   ROS As per history of present illness. All pertinent systems were reviewed above. Constitutional,  HEENT, cardiovascular, respiratory, GI, GU, musculoskeletal, neuro, psychiatric, endocrine,  integumentary and hematologic systems were reviewed and are otherwise  negative/unremarkable except for positive findings mentioned above in the HPI.   MEDICATIONS AT HOME:   Prior to Admission medications   Medication Sig Start Date End Date Taking? Authorizing Provider  albuterol (PROVENTIL HFA;VENTOLIN HFA) 108 (90 BASE) MCG/ACT inhaler Inhale 2 puffs into the lungs 3 (three) times daily as needed for wheezing or shortness of breath.   Yes [provider]  amLODipine (NORVASC) 5 MG tablet Take 1 tablet (5 mg total) by mouth daily. Patient taking differently: Take 5 mg by mouth 2 (two) times daily.  02/21/20 02/20/21 Yes Willy Eddy, MD  pantoprazole (PROTONIX) 40 MG tablet Take 40 mg by mouth daily. 02/12/20  Yes [provider]  albuterol (PROVENTIL) (2.5 MG/3ML) 0.083% nebulizer solution Take 2.5 mg by nebulization every 6 (six) hours as needed for wheezing or shortness of breath.    [provider]  aspirin 81 MG chewable tablet Chew 81 mg by mouth daily. 05/04/16   [provider]  diphenoxylate-atropine (LOMOTIL) 2.5-0.025 MG per tablet Take 1-2 tablets by mouth 3 (three) times daily as needed for diarrhea or loose stools (for bowel spasms).    [provider]  EPINEPHrine (EPIPEN IJ) Inject 0.3 mg as directed as needed (Bee Stings).    [provider]  fluticasone (FLONASE) 50 MCG/ACT nasal spray Place 1-2 sprays into both nostrils daily at 6 (six) AM. 03/21/20   [provider]  hyoscyamine (LEVSIN SL) 0.125 MG SL tablet Take 0.125-0.25 mg by mouth every 4 (four) hours as needed for cramping.    [provider]  promethazine (PHENERGAN) 12.5 MG tablet Take 1 tablet (12.5 mg total) by mouth every 6 (six) hours as needed. 02/21/20   Willy Eddy, MD  promethazine (PHENERGAN) 25 MG suppository Place 12.5 mg rectally every 8 (eight) hours as needed for nausea or vomiting.    [provider]  ranitidine (  ZANTAC) 150 MG tablet Take 150 mg by mouth daily as needed. 05/05/17   [provider]  Tiotropium Bromide-Olodaterol (STIOLTO RESPIMAT) 2.5-2.5 MCG/ACT AERS Inhale 2 puffs into the lungs daily. 02/07/17   [provider]      VITAL SIGNS:  Blood pressure (!) 178/81, pulse (!) 119, temperature 100 F (37.8 C), temperature source Rectal, resp. rate (!) 22, height 5\' 3"  (1.6 m), weight 68.9 kg, SpO2 98 %.  PHYSICAL EXAMINATION:  Physical Exam  GENERAL:  64 y.o.-year-old Caucasian female patient lying in the bed with no acute distress.  EYES: Pupils equal, round, reactive to light and accommodation. No scleral icterus. Extraocular muscles intact.  HEENT: Head atraumatic, normocephalic. Oropharynx and nasopharynx clear.  NECK:  Supple, no jugular venous distention. No thyroid enlargement, no tenderness.  LUNGS: Diffuse expiratory and expiratory rhonchi and wheezes with diminished expiratory airflow and harsh vesicular breathing. CARDIOVASCULAR: Regular rate and rhythm, S1, S2 normal. No murmurs, rubs, or  gallops.  ABDOMEN: Soft, nondistended, nontender. Bowel sounds present. No organomegaly or mass.  EXTREMITIES: No pedal edema, cyanosis, or clubbing.  NEUROLOGIC: Cranial nerves II through XII are intact. Muscle strength 5/5 in all extremities. Sensation intact. Gait not checked.  PSYCHIATRIC: The patient is alert and oriented x 3.  Normal affect and good eye contact. SKIN: No obvious rash, lesion, or ulcer.   LABORATORY PANEL:   CBC Recent Labs  Lab 03/22/20 1345  WBC 29.5*  HGB 13.7  HCT 41.3  PLT 525*   ------------------------------------------------------------------------------------------------------------------  Chemistries  Recent Labs  Lab 03/22/20 1345  NA 136  K 2.9*  CL 97*  CO2 24  GLUCOSE 248*  BUN 10  CREATININE 0.86  CALCIUM 9.5  AST 40  ALT 18  ALKPHOS 121  BILITOT 0.6   ------------------------------------------------------------------------------------------------------------------  Cardiac Enzymes No results for input(s): TROPONINI in the last 168 hours. ------------------------------------------------------------------------------------------------------------------  RADIOLOGY:  CT CHEST W CONTRAST  Result Date: 03/22/2020 CLINICAL DATA:  Cough, shortness of breath, chest tightness and congestion. EXAM: CT CHEST, ABDOMEN, AND PELVIS WITH CONTRAST TECHNIQUE: Multidetector CT imaging of the chest, abdomen and pelvis was performed following the standard protocol during bolus administration of intravenous contrast. CONTRAST:  170mL OMNIPAQUE IOHEXOL 300 MG/ML  SOLN COMPARISON:  December 11, 2016 FINDINGS: CT CHEST FINDINGS Cardiovascular: There is marked severity calcification of the aortic arch. Normal heart size. No pericardial effusion. Mediastinum/Nodes: No enlarged mediastinal, hilar, or axillary lymph nodes. Thyroid gland, trachea, and esophagus demonstrate no significant findings. Lungs/Pleura: Lungs are clear. No pleural effusion or pneumothorax.  Musculoskeletal: Multiple sternal wires are seen. Degenerative changes are seen within the thoracic spine. CT ABDOMEN PELVIS FINDINGS Hepatobiliary: A stable 7 mm focus of parenchymal low attenuation is seen within the anteromedial aspect of the right lobe of the liver (axial CT image 46, CT series number 507). No gallstones, gallbladder wall thickening, or biliary dilatation. Pancreas: Unremarkable. No pancreatic ductal dilatation or surrounding inflammatory changes. Spleen: Normal in size without focal abnormality. Adrenals/Urinary Tract: Adrenal glands are unremarkable. Kidneys are normal, without renal calculi, focal lesion, or hydronephrosis. Bladder is unremarkable. Stomach/Bowel: There is a small to moderate sized hiatal hernia. Appendix appears normal. No evidence of bowel wall thickening, distention, or inflammatory changes. Noninflamed diverticula are seen throughout the large bowel. Vascular/Lymphatic: There is marked severity calcification of the abdominal aorta. No enlarged abdominal or pelvic lymph nodes. Reproductive: Status post hysterectomy. No adnexal masses. Other: No abdominal wall hernia or abnormality. No abdominopelvic ascites. Musculoskeletal: There is grade 1 anterolisthesis  of the L4 vertebral body on L5. Mild to moderate severity multilevel degenerative changes are seen throughout the lumbar spine, most prominent at the level of L1-L2. IMPRESSION: 1. Noninflamed diverticula throughout the large bowel. 2. Small to moderate sized hiatal hernia. 3. Stable 7 mm focus of parenchymal low attenuation within the anteromedial aspect of the right lobe of the liver. This may represent a small cyst or hemangioma. 4. Grade 1 anterolisthesis of the L4 vertebral body on L5. 5. Mild to moderate severity multilevel degenerative changes throughout the lumbar spine, most prominent at the level of L1-L2. 6. Aortic atherosclerosis. Aortic Atherosclerosis (ICD10-I70.0). Electronically Signed   By: Aram Candela M.D.   On: 03/22/2020 17:58   CT ABDOMEN PELVIS W CONTRAST  Result Date: 03/22/2020 CLINICAL DATA:  Cough, shortness of breath, chest tightness and congestion. EXAM: CT CHEST, ABDOMEN, AND PELVIS WITH CONTRAST TECHNIQUE: Multidetector CT imaging of the chest, abdomen and pelvis was performed following the standard protocol during bolus administration of intravenous contrast. CONTRAST:  OMNIPAQUE IOHEXOL 300 MG/ML  SOLN COMPARISON:  Feb 21, 2020 FINDINGS: CT CHEST FINDINGS Cardiovascular: There is marked severity calcification of the aortic arch. Normal heart size. No pericardial effusion. Mediastinum/Nodes: No enlarged mediastinal, hilar, or axillary lymph nodes. Thyroid gland, trachea, and esophagus demonstrate no significant findings. Lungs/Pleura: Lungs are clear. No pleural effusion or pneumothorax. Musculoskeletal: Multiple sternal wires are seen. Degenerative changes are seen within the thoracic spine. CT ABDOMEN PELVIS FINDINGS Hepatobiliary: A stable 7 mm focus of parenchymal low attenuation is seen within the anteromedial aspect of the right lobe of the liver (axial CT image 46, CT series number 507). No gallstones, gallbladder wall thickening, or biliary dilatation. Pancreas: Unremarkable. No pancreatic ductal dilatation or surrounding inflammatory changes. Spleen: Normal in size without focal abnormality. Adrenals/Urinary Tract: Adrenal glands are unremarkable. Kidneys are normal, without renal calculi, focal lesion, or hydronephrosis. Bladder is unremarkable. Stomach/Bowel: There is a small to moderate sized hiatal hernia. Appendix appears normal. No evidence of bowel wall thickening, distention, or inflammatory changes. Noninflamed diverticula are seen throughout the large bowel. Vascular/Lymphatic: There is marked severity calcification of the abdominal aorta. No enlarged abdominal or pelvic lymph nodes. Reproductive: Status post hysterectomy. No adnexal masses. Other: No abdominal wall  hernia or abnormality. No abdominopelvic ascites. Musculoskeletal: There is grade 1 anterolisthesis of the L4 vertebral body on L5. Mild to moderate severity multilevel degenerative changes are seen throughout the lumbar spine, most prominent at the level of L1-L2. IMPRESSION: 1. Noninflamed diverticula throughout the large bowel. 2. Small to moderate sized hiatal hernia. 3. Stable 7 mm focus of parenchymal low attenuation within the anteromedial aspect of the right lobe of the liver. This may represent a small cyst or hemangioma. 4. Grade 1 anterolisthesis of the L4 vertebral body on L5. 5. Mild to moderate severity multilevel degenerative changes throughout the lumbar spine, most prominent at the level of L1-L2. 6. Aortic atherosclerosis. Aortic Atherosclerosis (ICD10-I70.0). Electronically Signed   By: Aram Candela M.D.   On: 03/22/2020 17:57   DG Chest Portable 1 View  Result Date: 03/22/2020 CLINICAL DATA:  Allergic reaction.  History of asthma. EXAM: PORTABLE CHEST 1 VIEW COMPARISON:  Chest radiograph 03/22/2020. FINDINGS: Monitoring leads overlie the patient. Stable cardiac and mediastinal contours. No consolidative pulmonary opacities. No pleural effusion or pneumothorax. Thoracic spine degenerative changes. IMPRESSION: No acute cardiopulmonary process. Electronically Signed   By: Annia Belt M.D.   On: 03/22/2020 16:52   DG Chest Portable 1 View  Result Date: 03/22/2020 CLINICAL DATA:  Elevated white blood cell count, hypoxia, sepsis EXAM: PORTABLE CHEST 1 VIEW COMPARISON:  02/21/2020 FINDINGS: Frontal view of the chest demonstrates stable postsurgical changes from CABG. The cardiac silhouette is unremarkable. No airspace disease, effusion, or pneumothorax. No acute bony abnormality. IMPRESSION: 1. No acute intrathoracic process. Electronically Signed   By: Sharlet SalinaMichael  Brown M.D.   On: 03/22/2020 15:19      IMPRESSION AND PLAN:   1.  Asthma and COPD acute exacerbation likely secondary to  acute bronchitis possibly with mild sepsis.  The patient had fever and elevated lactic acid as well as significant leuko-cytosis. -The patient will be admitted to progressive unit bed. -We will continue bronchodilator therapy with Xopenex and Atrovent MDIs. -We will continue steroid therapy with IV Solu-Medrol. -We will continue antibiotic therapy starting tomorrow with IV Levaquin. -We will hold off her Stiolto and Trelegy.  2.  Elevated BNP. -This could be related to early cor pulmonale/acute likely diastolic CHF.  This could have been exacerbated by her hypertensive urgency.  She does not have pulmonary edema. -We will obtain a 2D echo in a.m. for further assessment. -She will be placed on IV Lasix.  3.  Hypertensive urgency. -She will be placed on as needed Nitropaste and p.o. hydralazine. -Seem to be worsening with nebulized albuterol.  This was switched to Xopenex MDI as mentioned above.  4.  Anaphylaxis secondary to cefepime/cephalosporins. -We will continue steroid therapy with IV Solu-Medrol as well as IV Pepcid and Benadryl. -Antibiotics were switched to Levaquin and to be started tomorrow.  5.  Hypokalemia. -Potassium will be replaced and magnesium level will be checked.  6.  Type 2 diabetes mellitus, mildly uncontrolled. -The patient will be placed on supplement coverage with NovoLog.  7.  Tobacco abuse. -Smoking cessation education.  8.  DVT prophylaxis. -Subcutaneous Lovenox.   All the records are reviewed and case discussed with ED provider. The plan of care was discussed in details with the patient (and family). I answered all questions. The patient agreed to proceed with the above mentioned plan. Further management will depend upon hospital course.   CODE STATUS: Full code  Status is: Inpatient  Remains inpatient appropriate because:Hemodynamically unstable, Ongoing diagnostic testing needed not appropriate for outpatient work up, Unsafe d/c plan, IV  treatments appropriate due to intensity of illness or inability to take PO and Inpatient level of care appropriate due to severity of illness   Dispo: The patient is from: Home              Anticipated d/c is to: Home              Anticipated d/c date is: 3 days              Patient currently is not medically stable to d/c.  TOTAL TIME TAKING CARE OF THIS PATIENT: 55 minutes.    Hannah BeatJan A Taran Haynesworth M.D on 03/22/2020 at 7:50 PM  [facility]   From 7 PM-7 AM, contact night-coverage [email or web address]  CC: Primary care physician; Patient, No Pcp Per   Note: This dictation was prepared with Dragon dictation along with smaller phrase technology. Any transcriptional errors that result from this process are unintentional.

## 2020-03-22 NOTE — ED Provider Notes (Addendum)
Called the patient's bedside as she was getting her cephalosporin as she became very itchy and began developing hives.  Very rapidly she became wheezy and short of breath.  She was given IM epi and then Benadryl & Medrol her heart rate went up blood pressure which showed then seeming to drop as her pulse got very faint and she got gray went up very high she began to wheeze we will give her oxygen and then nonrebreather and then bag-valve-mask she preferred the bag-valve-mask to assist her respirations she never lost consciousness.  We then got her on BiPAP her blood pressure went up higher with albuterol.  She had told me she could not have beta-blockers as she got a paradoxical reaction and then she got a couple with the data of the albuterol and said she could not have that either and her blood pressure went up markedly after just a few puffs that she said it would that was stopped immediately and we gave her Nitropaste and IV nitro.  Blood pressure began to come down again.  Patient is now awake alert oriented she is neither gray nor bright red like she was with blood pressure higher.  Blood pressure now is 182/91 with a heart rate of 138 we have taken her off the BiPAP as her lungs are not wheezy anymore her O2 sats are 98% she is just on 2 L of nasal cannula.  We have got an inch of Nitropaste on her and nitro IV at 5 mics.  We are watching her very closely.  She is no longer having a reaction to the cephalosporin and she is not having any reaction to the at the or the albuterol either.  Just the remains of the hypertension from them.   Arnaldo Natal, MD 03/22/20 1628 Critical care time for this episode was half an hour I spent the entire time at the patient's bedside assisting her respirations and giving direction to her care.   Arnaldo Natal, MD 03/22/20 1640

## 2020-03-22 NOTE — ED Triage Notes (Signed)
Pt arrives via POV stating that urgent care told her that she "had sepsis" because her WBC were double and her O2 saturation was 80%. Pt reports cough, shob, chest tightness and congestion. Pt visibly shob after walking from lobby.

## 2020-03-22 NOTE — ED Provider Notes (Signed)
[facility] Emergency Department Provider Note ____________________________________________   First MD Initiated Contact with Patient 03/22/20 1406     (approximate)  I have reviewed the triage vital signs and the nursing notes.   HISTORY  Chief Complaint Shortness of Breath    HPI [name] is a 64 y.o. female with PMH as noted below including asthma, hypertension, and COPD who presents due to fever, chest tightness, and concern for sepsis.  The patient states that she has had chest tightness for at least several days along with shortness of breath, mild cough, and congestion.  She denies any associated vomiting or diarrhea, and has no urinary symptoms, but does report some crampy upper abdominal pain over the last few days.  She states she was seen at an urgent care in [facility] yesterday and was told she possibly had sepsis due to an elevated WBC count.  She was started on Augmentin although she states that they were not sure what infection they were treating.  Past Medical History:  Diagnosis Date  . Asthma   . Benign essential HTN   . COPD (chronic obstructive pulmonary disease) (HCC)   . IBS (irritable bowel syndrome)   . Migraine headache     Patient Active Problem List   Diagnosis Date Noted  . 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 Renal artery stenosis, non-flow-limiting (HCC) 06/14/2016  . 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 Palpitations 04/22/2016  . Overweight (BMI 25.0-29.9) 01/22/2015  . COPD exacerbation (HCC) 01/19/2015  . Acute respiratory failure with hypoxia (HCC) 01/19/2015  . 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 Essential hypertension 01/19/2015  . Chronic pain syndrome 01/19/2015  . ACUTE CYSTITIS 04/30/2010  . GASTROINTESTINAL XRAY, ABNORMAL 01/05/2010  . GERD 12/25/2009  . BREAST MASS 10/24/2009  . ANXIETY DEPRESSION 09/17/2009  . WEIGHT LOSS 09/17/2009  .  ABDOMINAL PAIN -GENERALIZED 09/03/2009  . CLOSTRIDIUM DIFFICILE COLITIS 06/16/2009  . MICROSCOPIC HEMATURIA 06/09/2009  . BACK PAIN, LUMBAR 06/09/2009  . PERIPHERAL NEUROPATHY 05/28/2009  . CONTUSION, HIP, RIGHT 05/28/2009  . SEBACEOUS CYST 02/13/2009  . BACK PAIN, CHRONIC 02/13/2009  . DYSPLASTIC NEVUS, BACK 09/18/2008  . ANXIETY 08/28/2008  . 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 OTHER DYSPHAGIA 10/30/2007  . IRRITABLE BOWEL SYNDROME 09/29/2007  . TOBACCO ABUSE 07/17/2007  . Migraine 07/17/2007  . DISEASE, HYPERTENSIVE HEART NOS, W/O HF 05/25/2007  . Hyperlipidemia 03/22/2007  . PERIPHERAL VASCULAR DISEASE 03/22/2007  . ASTHMA 03/22/2007  . DIVERTICULOSIS, COLON 03/22/2007    History reviewed. No pertinent surgical history.  Prior to Admission medications   Medication Sig Start Date End Date Taking? Authorizing Provider  albuterol (PROVENTIL HFA;VENTOLIN HFA) 108 (90 BASE) MCG/ACT inhaler Inhale 2 puffs into the lungs 3 (three) times daily as needed for wheezing or shortness of breath.    [provider]  albuterol (PROVENTIL) (2.5 MG/3ML) 0.083% nebulizer solution Take 2.5 mg by nebulization every 6 (six) hours as needed for wheezing or shortness of breath.    [provider]  amLODipine (NORVASC) 5 MG tablet Take 1 tablet (5 mg total) by mouth daily. 02/21/20 02/20/21  Willy Eddy, MD  aspirin 81 MG chewable tablet Chew 81 mg by mouth daily. 05/04/16   [provider]  cloNIDine (CATAPRES) 0.1 MG tablet Take 0.1 mg by mouth 2 (two) times daily. 05/05/17   [provider]  diphenoxylate-atropine (LOMOTIL) 2.5-0.025 MG  per tablet Take 1-2 tablets by mouth 3 (three) times daily as needed for diarrhea or loose stools (for bowel spasms).    [provider]  EPINEPHrine (EPIPEN IJ) Inject 0.3 mg as directed as needed (Bee Stings).    [provider]  hyoscyamine (LEVSIN  SL) 0.125 MG SL tablet Take 0.125-0.25 mg by mouth every 4 (four) hours as needed for cramping.    [provider]  lisinopril (PRINIVIL,ZESTRIL) 20 MG tablet Take 20 mg by mouth 2 (two) times daily.     [provider]  meclizine (ANTIVERT) 25 MG tablet Take 1 tablet (25 mg total) by mouth 3 (three) times daily as needed for dizziness or nausea. 02/21/20   Emily Filbert, MD  promethazine (PHENERGAN) 12.5 MG tablet Take 1 tablet (12.5 mg total) by mouth every 6 (six) hours as needed. 02/21/20   Willy Eddy, MD  promethazine (PHENERGAN) 25 MG suppository Place 12.5 mg rectally every 8 (eight) hours as needed for nausea or vomiting.    [provider]  ranitidine (ZANTAC) 150 MG tablet Take 150 mg by mouth daily as needed. 05/05/17   [provider]  Tiotropium Bromide-Olodaterol (STIOLTO RESPIMAT) 2.5-2.5 MCG/ACT AERS Inhale 2 puffs into the lungs daily. 02/07/17   [provider]    Allergies Cefepime, Acetaminophen, Aspirin, Beta adrenergic blockers, Fentanyl and related, Lisinopril, Midazolam hcl, Prednisone, Propofol, Statins, and Sulfamethoxazole-trimethoprim  Family History  Problem Relation Age of Onset  . Hypertension Mother   . CAD Brother        Deceased on MI age 59  . Cancer - Other Father        Oropharyngeal SCCa    Social History Social History   Tobacco Use  . Smoking status: Heavy Tobacco Smoker    Packs/day: 1.00    Types: Cigarettes  . Smokeless tobacco: Never Used  Substance Use Topics  . Alcohol use: Yes    Alcohol/week: 0.0 standard drinks    Comment: Occasional to Rare  . Drug use: No    Review of Systems  Constitutional: Positive for fever. Eyes: No redness. ENT: No sore throat. Cardiovascular: Denies chest pain. Respiratory: Positive for shortness of breath. Gastrointestinal: No vomiting or diarrhea.  Genitourinary: Negative for dysuria.  Musculoskeletal: Negative for back pain. Skin: Negative  for rash. Neurological: Negative for headache.   ____________________________________________   PHYSICAL EXAM:  VITAL SIGNS: ED Triage Vitals  Enc Vitals Group     BP 03/22/20 1340 (!) 228/89     Pulse Rate 03/22/20 1340 (!) 142     Resp 03/22/20 1340 (!) 22     Temp 03/22/20 1340 99.1 F (37.3 C)     Temp Source 03/22/20 1340 Oral     SpO2 03/22/20 1340 98 %     Weight 03/22/20 1341 152 lb (68.9 kg)     Height 03/22/20 1341 5\' 3"  (1.6 m)     Head Circumference --      Peak Flow --      Pain Score 03/22/20 1341 10     Pain Loc --      Pain Edu? --      Excl. in GC? --     Constitutional: Alert and oriented.  Uncomfortable and weak appearing but in no acute distress. Eyes: Conjunctivae are normal.  EOMI. Head: Atraumatic. Nose: No congestion/rhinnorhea. Mouth/Throat: Mucous membranes are somewhat dry.   Neck: Normal range of motion.  Cardiovascular: Tachycardic, regular rhythm. Grossly normal heart sounds.  Good peripheral  circulation. Respiratory: Normal respiratory effort.  No retractions. Lungs CTAB. Gastrointestinal: Soft with mild epigastric tenderness.  No distention.  Genitourinary: No flank tenderness. Musculoskeletal: Extremities warm and well perfused.  Neurologic:  Normal speech and language. No gross focal neurologic deficits are appreciated.  Skin:  Skin is warm and dry. No rash noted. Psychiatric: Mood and affect are normal. Speech and behavior are normal.  ____________________________________________   LABS (all labs ordered are listed, but only abnormal results are displayed)  Labs Reviewed  COMPREHENSIVE METABOLIC PANEL - Abnormal; Notable for the following components:      Result Value   Potassium 2.9 (*)    Chloride 97 (*)    Glucose, Bld 248 (*)    All other components within normal limits  LACTIC ACID, PLASMA - Abnormal; Notable for the following components:   Lactic Acid, Venous 4.3 (*)    All other components within normal limits  CBC  WITH DIFFERENTIAL/PLATELET - Abnormal; Notable for the following components:   WBC 29.5 (*)    Platelets 525 (*)    Neutro Abs 25.5 (*)    Monocytes Absolute 1.9 (*)    Abs Immature Granulocytes 0.29 (*)    All other components within normal limits  BRAIN NATRIURETIC PEPTIDE - Abnormal; Notable for the following components:   B Natriuretic Peptide 255.4 (*)    All other components within normal limits  TROPONIN I (HIGH SENSITIVITY) - Abnormal; Notable for the following components:   Troponin I (High Sensitivity) 31 (*)    All other components within normal limits  SARS CORONAVIRUS 2 BY RT PCR (HOSPITAL ORDER, PERFORMED IN [facility])  CULTURE, BLOOD (ROUTINE X 2)  CULTURE, BLOOD (ROUTINE X 2)  PROTIME-INR  LACTIC ACID, PLASMA  URINALYSIS, COMPLETE (UACMP) WITH MICROSCOPIC  TROPONIN I (HIGH SENSITIVITY)   ____________________________________________  EKG  ED ECG REPORT I, Dionne Bucy, the attending physician, personally viewed and interpreted this ECG.  Date: 03/22/2020 EKG Time: 1400 Rate: 127 Rhythm: Sinus tachycardia QRS Axis: normal Intervals: normal ST/T Wave abnormalities: Nonspecific T wave abnormalities Narrative Interpretation: Nonspecific abnormalities with no evidence of acute ischemia  ____________________________________________  RADIOLOGY  CXR: No focal infiltrate or other acute abnormality  ____________________________________________   PROCEDURES  Procedure(s) performed: No  Procedures  Critical Care performed: Yes  CRITICAL CARE Performed by: Dionne Bucy   Total critical care time: 20 minutes  Critical care time was exclusive of separately billable procedures and treating other patients.  Critical care was necessary to treat or prevent imminent or life-threatening deterioration.  Critical care was time spent personally by me on the following activities: development of treatment plan with patient and/or  surrogate as well as nursing, discussions with consultants, evaluation of patient's response to treatment, examination of patient, obtaining history from patient or surrogate, ordering and performing treatments and interventions, ordering and review of laboratory studies, ordering and review of radiographic studies, pulse oximetry and re-evaluation of patient's condition. ____________________________________________   INITIAL IMPRESSION / ASSESSMENT AND PLAN / ED COURSE  Pertinent labs & imaging results that were available during my care of the patient were reviewed by me and considered in my medical decision making (see chart for details).  64 year old female with PMH as noted above presents with chest tightness, cough, shortness of breath over the last several days and concern for possible sepsis after being told at an outside urgent care that her WBC count was "double."  I reviewed the past medical records in Epic.  I do not  have access to any note from the urgent care visit yesterday.  The patient was most recently treated and released from the ED for abdominal pain and headache last month.  On exam, the patient is tachycardic and hypertensive with otherwise normal vital signs.  She is overall relatively well-appearing although mildly tremulous.  She denies alcohol use.  The lungs are clear.  Abdomen is soft with mild epigastric discomfort to palpation.  Neurologic exam is nonfocal.  The patient's vital signs and overall presentation are concerning for acute infection/sepsis.  The most likely source would be respiratory, including bacterial pneumonia, bronchitis, COVID-19.  We will obtain chest x-ray, UA, labs for sepsis work-up, give fluids and reassess.  ----------------------------------------- 3:50 PM on 03/22/2020 -----------------------------------------  Chest x-ray shows no evidence of pneumonia.  However, lab work-up is consistent with sepsis, with elevated lactate and WBC count.   The source of the infection is unclear.  Urinalysis is still pending.  I have also added on a CT of the abdomen given that the patient has reported some abdominal pain along with the other symptoms recently.  Broad-spectrum IV antibiotics have been ordered.  The patient will require admission once this additional work-up is complete.  I signed her out to the oncoming ED physician Dr. Cinda Quest.  ____________________________________________   FINAL CLINICAL IMPRESSION(S) / ED DIAGNOSES  Final diagnoses:  Sepsis, due to unspecified organism, unspecified whether acute organ dysfunction present [facility])      NEW MEDICATIONS STARTED DURING THIS VISIT:  New Prescriptions   No medications on file     Note:  This document was prepared using Dragon voice recognition software and may include unintentional dictation errors.    Arta Silence, MD 03/22/20 1620

## 2020-03-22 NOTE — Progress Notes (Signed)
PHARMACY -  BRIEF ANTIBIOTIC NOTE   Pharmacy has received consult(s) for vancomycin and cefepime from an ED provider.  The patient's profile has been reviewed for ht/wt/allergies/indication/available labs.    One time order(s) placed for Cefepime 2g and vancomycin 1g. Will order an additional 500mg  IV for a total loading dose of 1500mg   Further antibiotics/pharmacy consults should be ordered by admitting physician if indicated.                       Thank you, , PharmD, BCPS Clinical Pharmacist 03/22/2020 3:10 PM

## 2020-03-23 ENCOUNTER — Other Ambulatory Visit: Payer: Self-pay

## 2020-03-23 DIAGNOSIS — I160 Hypertensive urgency: Secondary | ICD-10-CM | POA: Diagnosis present

## 2020-03-23 DIAGNOSIS — T782XXA Anaphylactic shock, unspecified, initial encounter: Secondary | ICD-10-CM | POA: Diagnosis not present

## 2020-03-23 LAB — BASIC METABOLIC PANEL
Anion gap: 14 (ref 5–15)
BUN: 8 mg/dL (ref 8–23)
CO2: 28 mmol/L (ref 22–32)
Calcium: 9 mg/dL (ref 8.9–10.3)
Chloride: 96 mmol/L — ABNORMAL LOW (ref 98–111)
Creatinine, Ser: 0.97 mg/dL (ref 0.44–1.00)
GFR calc Af Amer: >60 mL/min (ref >60)
GFR calc non Af Amer: >60 mL/min (ref >60)
Glucose, Bld: 196 mg/dL — ABNORMAL HIGH (ref 70–99)
Potassium: 3.3 mmol/L — ABNORMAL LOW (ref 3.5–5.1)
Sodium: 138 mmol/L (ref 135–145)

## 2020-03-23 LAB — LACTIC ACID, PLASMA
Lactic Acid, Venous: 3.4 mmol/L (ref 0.5–1.9)
Lactic Acid, Venous: 2.6 mmol/L (ref 0.5–1.9)

## 2020-03-23 LAB — HIV ANTIBODY (ROUTINE TESTING W REFLEX): HIV Screen 4th Generation wRfx: NONREACTIVE

## 2020-03-23 LAB — CBC WITH DIFFERENTIAL/PLATELET
Abs Immature Granulocytes: 0.35 10*3/uL — ABNORMAL HIGH (ref 0.00–0.07)
Basophils Absolute: 0.1 10*3/uL (ref 0.0–0.1)
Basophils Relative: 0 %
Eosinophils Absolute: 0 10*3/uL (ref 0.0–0.5)
Eosinophils Relative: 0 %
HCT: 40.3 % (ref 36.0–46.0)
Hemoglobin: 13.3 g/dL (ref 12.0–15.0)
Immature Granulocytes: 1 %
Lymphocytes Relative: 3 %
Lymphs Abs: 0.9 10*3/uL (ref 0.7–4.0)
MCH: 28.4 pg (ref 26.0–34.0)
MCHC: 33 g/dL (ref 30.0–36.0)
MCV: 86.1 fL (ref 80.0–100.0)
Monocytes Absolute: 1.1 10*3/uL — ABNORMAL HIGH (ref 0.1–1.0)
Monocytes Relative: 4 %
Neutro Abs: 26.6 10*3/uL — ABNORMAL HIGH (ref 1.7–7.7)
Neutrophils Relative %: 92 %
Platelets: 457 10*3/uL — ABNORMAL HIGH (ref 150–400)
RBC: 4.68 MIL/uL (ref 3.87–5.11)
RDW: 13.3 % (ref 11.5–15.5)
WBC: 29 10*3/uL — ABNORMAL HIGH (ref 4.0–10.5)
nRBC: 0 % (ref 0.0–0.2)

## 2020-03-23 LAB — HEMOGLOBIN A1C
Hgb A1c MFr Bld: 7.9 % — ABNORMAL HIGH (ref 4.8–5.6)
Mean Plasma Glucose: 180.03 mg/dL

## 2020-03-23 LAB — MAGNESIUM: Magnesium: 1.7 mg/dL (ref 1.7–2.4)

## 2020-03-23 LAB — GLUCOSE, CAPILLARY
Glucose-Capillary: 327 mg/dL — ABNORMAL HIGH (ref 70–99)
Glucose-Capillary: 363 mg/dL — ABNORMAL HIGH (ref 70–99)
Glucose-Capillary: 291 mg/dL — ABNORMAL HIGH (ref 70–99)
Glucose-Capillary: 259 mg/dL — ABNORMAL HIGH (ref 70–99)

## 2020-03-23 LAB — PROCALCITONIN: Procalcitonin: 0.18 ng/mL

## 2020-03-23 LAB — MRSA PCR SCREENING: MRSA by PCR: NEGATIVE

## 2020-03-23 MED ORDER — POTASSIUM CHLORIDE 20 MEQ PO PACK
40.0000 meq | PACK | Freq: Once | ORAL | Status: DC
  Filled 2020-03-23: qty 2

## 2020-03-23 MED ORDER — MAGNESIUM SULFATE 2 GM/50ML IV SOLN
2.0000 g | Freq: Once | INTRAVENOUS | Status: AC
  Administered 2020-03-23: 2 g via INTRAVENOUS
  Filled 2020-03-23: qty 50

## 2020-03-23 MED ORDER — POTASSIUM CHLORIDE 20 MEQ PO PACK: 40.0000 meq | PACK | Freq: Once | ORAL | Status: DC

## 2020-03-23 MED ORDER — FAMOTIDINE 20 MG PO TABS
20.0000 mg | ORAL_TABLET | Freq: Every day | ORAL | Status: DC
  Administered 2020-03-24 – 2020-03-26 (×3): 20 mg via ORAL
  Filled 2020-03-23 (×3): qty 1

## 2020-03-23 MED ORDER — LEVALBUTEROL HCL 0.63 MG/3ML IN NEBU
0.6300 mg | INHALATION_SOLUTION | Freq: Four times a day (QID) | RESPIRATORY_TRACT | Status: DC | PRN
  Filled 2020-03-23: qty 3

## 2020-03-23 MED ORDER — IPRATROPIUM BROMIDE 0.02 % IN SOLN
2.5000 mL | Freq: Four times a day (QID) | RESPIRATORY_TRACT | Status: DC
  Administered 2020-03-23 – 2020-03-25 (×8): 0.5 mg via RESPIRATORY_TRACT
  Filled 2020-03-23 (×10): qty 2.5

## 2020-03-23 MED ORDER — FUROSEMIDE 10 MG/ML IJ SOLN
40.0000 mg | Freq: Two times a day (BID) | INTRAMUSCULAR | Status: DC
  Administered 2020-03-23 – 2020-03-25 (×4): 40 mg via INTRAVENOUS
  Filled 2020-03-23 (×4): qty 4

## 2020-03-23 MED ORDER — IPRATROPIUM BROMIDE 0.02 % IN SOLN: 0.5000 mg | Freq: Four times a day (QID) | RESPIRATORY_TRACT | Status: DC | PRN

## 2020-03-23 MED ORDER — POTASSIUM CHLORIDE CRYS ER 20 MEQ PO TBCR
40.0000 meq | EXTENDED_RELEASE_TABLET | Freq: Once | ORAL | Status: AC
  Administered 2020-03-23: 40 meq via ORAL
  Filled 2020-03-23: qty 2

## 2020-03-23 MED ORDER — HYDROMORPHONE HCL 2 MG PO TABS
2.0000 mg | ORAL_TABLET | ORAL | Status: DC | PRN
  Administered 2020-03-23 – 2020-03-25 (×6): 2 mg via ORAL
  Filled 2020-03-23 (×6): qty 1

## 2020-03-23 MED ORDER — DILTIAZEM HCL ER 60 MG PO CP12
60.0000 mg | ORAL_CAPSULE | Freq: Two times a day (BID) | ORAL | Status: DC
  Filled 2020-03-23 (×2): qty 1

## 2020-03-23 MED ORDER — HYDROCODONE-ACETAMINOPHEN 5-325 MG PO TABS: 1.0000 | ORAL_TABLET | Freq: Four times a day (QID) | ORAL | Status: DC | PRN

## 2020-03-23 MED ORDER — AMLODIPINE BESYLATE 5 MG PO TABS
5.0000 mg | ORAL_TABLET | Freq: Every day | ORAL | Status: DC
  Administered 2020-03-24: 5 mg via ORAL
  Filled 2020-03-23 (×3): qty 1

## 2020-03-23 MED ORDER — KETOROLAC TROMETHAMINE 15 MG/ML IJ SOLN
15.0000 mg | Freq: Four times a day (QID) | INTRAMUSCULAR | Status: DC | PRN
  Administered 2020-03-23: 15 mg via INTRAVENOUS
  Filled 2020-03-23: qty 1

## 2020-03-23 NOTE — Plan of Care (Signed)
  Problem: Education: Goal: Knowledge of General Education information will improve Description: Including pain rating scale, medication(s)/side effects and non-pharmacologic comfort measures Outcome: Progressing   Problem: Health Behavior/Discharge Planning: Goal: Ability to manage health-related needs will improve Outcome: Progressing   Problem: Clinical Measurements: Goal: Ability to maintain clinical measurements within normal limits will improve Outcome: Progressing Goal: Will remain free from infection Outcome: Progressing Goal: Diagnostic test results will improve Outcome: Progressing Goal: Respiratory complications will improve Outcome: Progressing Goal: Cardiovascular complication will be avoided Outcome: Progressing   Problem: Activity: Goal: Risk for activity intolerance will decrease Outcome: Progressing   Problem: Nutrition: Goal: Adequate nutrition will be maintained Outcome: Progressing   Problem: Coping: Goal: Level of anxiety will decrease Outcome: Progressing   Problem: Elimination: Goal: Will not experience complications related to bowel motility Outcome: Progressing Goal: Will not experience complications related to urinary retention Outcome: Progressing   Problem: Pain Managment: Goal: General experience of comfort will improve Outcome: Progressing   Problem: Elimination: Goal: Will not experience complications related to bowel motility Outcome: Progressing Goal: Will not experience complications related to urinary retention Outcome: Progressing   Problem: Elimination: Goal: Will not experience complications related to bowel motility Outcome: Progressing Goal: Will not experience complications related to urinary retention Outcome: Progressing   Problem: Safety: Goal: Ability to remain free from injury will improve Outcome: Progressing   Problem: Skin Integrity: Goal: Risk for impaired skin integrity will decrease Outcome: Progressing   Problem: Education: Goal: Ability to demonstrate management of disease process will improve Outcome: Progressing Goal: Ability to verbalize understanding of medication therapies will improve Outcome: Progressing Goal: Individualized Educational Video(s) Outcome: Progressing   Problem: Activity: Goal: Capacity to carry out activities will improve Outcome: Progressing   Problem: Cardiac: Goal: Ability to achieve and maintain adequate cardiopulmonary perfusion will improve Outcome: Progressing

## 2020-03-23 NOTE — Consult Note (Signed)
Pulmonary Medicine          Date: 03/23/2020,   MRN# [id number] [name] 11-17-1955     AdmissionWeight: 68.9 kg                 CurrentWeight: 68.9 kg      CHIEF COMPLAINT:   Exacerbation of COPD with asthma overlap   HISTORY OF PRESENT ILLNESS   This is 64 year old female with a history of asthma and COPD overlap (ACOS), CABG, essential hypertension chronic migraines came into the hospital due to worsening shortness of breath and dyspnea with wheezing as well as cough over the past week.  Patient also reports subjective fevers tachycardia.  Associated symptoms of pitting lower extremity edema and disequilibrium.  On arrival she was noted to be tachycardic and with accelerated hypertension.  Patient had negative COVID19 PCR on this admission. Pulmonary consultation per request of patient for additional evaluation and input regarding complicated pulmonary status.  Patient reports significant worsening of breathing and worsening wheezing when she uses nebulizer therapy but she states albuterol MDI works better for her. She has been smoking since age 64 and reports still smoking in past 2 weeks. She was diagnosed with asthma at age 76 , states she had struggles with asthma in the 68s. She states she went to "natural cigarretes" and feels that its helping. She does see pulmonary in [place].    PAST MEDICAL HISTORY   Past Medical History:  Diagnosis Date  . Asthma   . Benign essential HTN   . COPD (chronic obstructive pulmonary disease) (HCC)   . IBS (irritable bowel syndrome)   . Migraine headache      SURGICAL HISTORY   History reviewed. No pertinent surgical history.   FAMILY HISTORY   Family History  Problem Relation Age of Onset  . Hypertension Mother   . CAD Brother        Deceased on MI age 64  . Cancer - Other Father        Oropharyngeal SCCa     SOCIAL HISTORY   Social History   Tobacco Use  . Smoking status: Heavy Tobacco Smoker   Packs/day: 1.00    Types: Cigarettes  . Smokeless tobacco: Never Used  Substance Use Topics  . Alcohol use: Yes    Alcohol/week: 0.0 standard drinks    Comment: Occasional to Rare  . Drug use: No     MEDICATIONS    Home Medication:    Current Medication:  Current Facility-Administered Medications:  .  0.9 %  sodium chloride infusion, 250 mL, Intravenous, PRN, Mansy, Jan A, MD .  ALPRAZolam Prudy Feeler) tablet 0.25 mg, 0.25 mg, Oral, BID PRN, Mansy, Jan A, MD .  aspirin chewable tablet 81 mg, 81 mg, Oral, Daily, Mansy, Jan A, MD, 81 mg at 03/23/20 1047 .  chlorpheniramine-HYDROcodone (TUSSIONEX) 10-8 MG/5ML suspension 5 mL, 5 mL, Oral, Q12H PRN, Mansy, Jan A, MD .  diltiazem (CARDIZEM SR) 12 hr capsule 60 mg, 60 mg, Oral, Q12H, Esaw Grandchild A, DO .  diphenhydrAMINE (BENADRYL) injection 25 mg, 25 mg, Intravenous, Q6H, Mansy, Jan A, MD, 25 mg at 03/23/20 0216 .  enoxaparin (LOVENOX) injection 40 mg, 40 mg, Subcutaneous, Q24H, Mansy, Jan A, MD .  famotidine (PEPCID) IVPB 20 mg premix, 20 mg, Intravenous, Q12H, Mansy, Jan A, MD, Last Rate: 100 mL/hr at 03/23/20 0902, 20 mg at 03/23/20 0902 .  fluticasone (FLONASE) 50 MCG/ACT nasal spray 1-2 spray, 1-2 spray, Each Nare,  Daily PRN, Mansy, Jan A, MD .  furosemide (LASIX) injection 40 mg, 40 mg, Intravenous, Q12H, Mansy, Jan A, MD, 40 mg at 03/23/20 0855 .  guaiFENesin (MUCINEX) 12 hr tablet 600 mg, 600 mg, Oral, BID, Mansy, Jan A, MD, 600 mg at 03/23/20 1048 .  hydrALAZINE (APRESOLINE) tablet 50 mg, 50 mg, Oral, Q6H PRN, Mansy, Jan A, MD .  HYDROcodone-acetaminophen (NORCO/VICODIN) 5-325 MG per tablet 1-2 tablet, 1-2 tablet, Oral, Q6H PRN, Esaw Grandchild A, DO .  insulin aspart (novoLOG) injection 0-15 Units, 0-15 Units, Subcutaneous, TID PC & HS, Mansy, Vernetta Honey, MD, 11 Units at 03/23/20 0213 .  ipratropium (ATROVENT) nebulizer solution 0.5 mg, 0.5 mg, Inhalation, Q6H, Mansy, Jan A, MD, 0.5 mg at 03/23/20 0825 .  ketorolac (TORADOL) 15 MG/ML  injection 15 mg, 15 mg, Intravenous, Q6H PRN, Esaw Grandchild A, DO, 15 mg at 03/23/20 1052 .  levalbuterol (XOPENEX) nebulizer solution 0.63 mg, 0.63 mg, Inhalation, Q6H, Mansy, Jan A, MD, 0.63 mg at 03/23/20 0825 .  levofloxacin (LEVAQUIN) IVPB 750 mg, 750 mg, Intravenous, Q24H, Mansy, Vernetta Honey, MD, Stopped at 03/23/20 0349 .  methylPREDNISolone sodium succinate (SOLU-MEDROL) 40 mg/mL injection 40 mg, 40 mg, Intravenous, Q8H, Mansy, Jan A, MD, 40 mg at 03/23/20 0856 .  nitroGLYCERIN (NITROGLYN) 2 % ointment 1 inch, 1 inch, Topical, Q6H PRN, Mansy, Jan A, MD .  ondansetron [facility]) injection 4 mg, 4 mg, Intravenous, Q6H PRN, Mansy, Jan A, MD .  pantoprazole (PROTONIX) EC tablet 40 mg, 40 mg, Oral, Daily, Mansy, Jan A, MD, 40 mg at 03/23/20 1048 .  sodium chloride flush (NS) 0.9 % injection 3 mL, 3 mL, Intravenous, Q12H, Mansy, Jan A, MD, 3 mL at 03/23/20 1212 .  sodium chloride flush (NS) 0.9 % injection 3 mL, 3 mL, Intravenous, PRN, Mansy, Jan A, MD .  zolpidem (AMBIEN) tablet 5 mg, 5 mg, Oral, QHS PRN, Mansy, Jan A, MD    ALLERGIES   Cefepime, Edoxaban, Acetaminophen, Aspirin, Beta adrenergic blockers, Fentanyl and related, Lisinopril, Midazolam hcl, Prednisone, Propofol, Statins, and Sulfamethoxazole-trimethoprim     REVIEW OF SYSTEMS    Review of Systems:  Gen:  Denies  fever, sweats, chills weigh loss  HEENT: Denies blurred vision, double vision, ear pain, eye pain, hearing loss, nose bleeds, sore throat Cardiac:  No dizziness, chest pain or heaviness, chest tightness,edema Resp:   Denies cough or sputum porduction, shortness of breath,wheezing, hemoptysis,  Gi: Denies swallowing difficulty, stomach pain, nausea or vomiting, diarrhea, constipation, bowel incontinence Gu:  Denies bladder incontinence, burning urine Ext:   Denies Joint pain, stiffness or swelling Skin: Denies  skin rash, easy bruising or bleeding or hives Endoc:  Denies polyuria, polydipsia , polyphagia or weight  change Psych:   Denies depression, insomnia or hallucinations   Other:  All other systems negative   VS: BP 139/68 (BP Location: Left Arm)   Pulse (!) 111   Temp 98 F (36.7 C)   Resp 18   Ht 5\' 3"  (1.6 m)   Wt 68.9 kg   SpO2 93%   BMI 26.91 kg/m      PHYSICAL EXAM    GENERAL:NAD, no fevers, chills, no weakness no fatigue HEAD: Normocephalic, atraumatic.  EYES: Pupils equal, round, reactive to light. Extraocular muscles intact. No scleral icterus.  MOUTH: Moist mucosal membrane. Dentition intact. No abscess noted.  EAR, NOSE, THROAT: Clear without exudates. No external lesions.  NECK: Supple. No thyromegaly. No nodules. No JVD.  PULMONARY: Diffuse coarse rhonchi right  sided +wheezes CARDIOVASCULAR: S1 and S2. Regular rate and rhythm. No murmurs, rubs, or gallops. No edema. Pedal pulses 2+ bilaterally.  GASTROINTESTINAL: Soft, nontender, nondistended. No masses. Positive bowel sounds. No hepatosplenomegaly.  MUSCULOSKELETAL: No swelling, clubbing, or edema. Range of motion full in all extremities.  NEUROLOGIC: Cranial nerves II through XII are intact. No gross focal neurological deficits. Sensation intact. Reflexes intact.  SKIN: No ulceration, lesions, rashes, or cyanosis. Skin warm and dry. Turgor intact.  PSYCHIATRIC: Mood, affect within normal limits. The patient is awake, alert and oriented x 3. Insight, judgment intact.       IMAGING    CT CHEST W CONTRAST  Result Date: 03/22/2020 CLINICAL DATA:  Cough, shortness of breath, chest tightness and congestion. EXAM: CT CHEST, ABDOMEN, AND PELVIS WITH CONTRAST TECHNIQUE: Multidetector CT imaging of the chest, abdomen and pelvis was performed following the standard protocol during bolus administration of intravenous contrast. CONTRAST:  181mL OMNIPAQUE IOHEXOL 300 MG/ML  SOLN COMPARISON:  December 11, 2016 FINDINGS: CT CHEST FINDINGS Cardiovascular: There is marked severity calcification of the aortic arch. Normal heart size.  No pericardial effusion. Mediastinum/Nodes: No enlarged mediastinal, hilar, or axillary lymph nodes. Thyroid gland, trachea, and esophagus demonstrate no significant findings. Lungs/Pleura: Lungs are clear. No pleural effusion or pneumothorax. Musculoskeletal: Multiple sternal wires are seen. Degenerative changes are seen within the thoracic spine. CT ABDOMEN PELVIS FINDINGS Hepatobiliary: A stable 7 mm focus of parenchymal low attenuation is seen within the anteromedial aspect of the right lobe of the liver (axial CT image 46, CT series number 507). No gallstones, gallbladder wall thickening, or biliary dilatation. Pancreas: Unremarkable. No pancreatic ductal dilatation or surrounding inflammatory changes. Spleen: Normal in size without focal abnormality. Adrenals/Urinary Tract: Adrenal glands are unremarkable. Kidneys are normal, without renal calculi, focal lesion, or hydronephrosis. Bladder is unremarkable. Stomach/Bowel: There is a small to moderate sized hiatal hernia. Appendix appears normal. No evidence of bowel wall thickening, distention, or inflammatory changes. Noninflamed diverticula are seen throughout the large bowel. Vascular/Lymphatic: There is marked severity calcification of the abdominal aorta. No enlarged abdominal or pelvic lymph nodes. Reproductive: Status post hysterectomy. No adnexal masses. Other: No abdominal wall hernia or abnormality. No abdominopelvic ascites. Musculoskeletal: There is grade 1 anterolisthesis of the L4 vertebral body on L5. Mild to moderate severity multilevel degenerative changes are seen throughout the lumbar spine, most prominent at the level of L1-L2. IMPRESSION: 1. Noninflamed diverticula throughout the large bowel. 2. Small to moderate sized hiatal hernia. 3. Stable 7 mm focus of parenchymal low attenuation within the anteromedial aspect of the right lobe of the liver. This may represent a small cyst or hemangioma. 4. Grade 1 anterolisthesis of the L4 vertebral  body on L5. 5. Mild to moderate severity multilevel degenerative changes throughout the lumbar spine, most prominent at the level of L1-L2. 6. Aortic atherosclerosis. Aortic Atherosclerosis (ICD10-I70.0). Electronically Signed   By: Virgina Norfolk M.D.   On: 03/22/2020 17:58   CT ABDOMEN PELVIS W CONTRAST  Result Date: 03/22/2020 CLINICAL DATA:  Cough, shortness of breath, chest tightness and congestion. EXAM: CT CHEST, ABDOMEN, AND PELVIS WITH CONTRAST TECHNIQUE: Multidetector CT imaging of the chest, abdomen and pelvis was performed following the standard protocol during bolus administration of intravenous contrast. CONTRAST:  139mL OMNIPAQUE IOHEXOL 300 MG/ML  SOLN COMPARISON:  Feb 21, 2020 FINDINGS: CT CHEST FINDINGS Cardiovascular: There is marked severity calcification of the aortic arch. Normal heart size. No pericardial effusion. Mediastinum/Nodes: No enlarged mediastinal, hilar, or axillary lymph nodes. Thyroid  gland, trachea, and esophagus demonstrate no significant findings. Lungs/Pleura: Lungs are clear. No pleural effusion or pneumothorax. Musculoskeletal: Multiple sternal wires are seen. Degenerative changes are seen within the thoracic spine. CT ABDOMEN PELVIS FINDINGS Hepatobiliary: A stable 7 mm focus of parenchymal low attenuation is seen within the anteromedial aspect of the right lobe of the liver (axial CT image 46, CT series number 507). No gallstones, gallbladder wall thickening, or biliary dilatation. Pancreas: Unremarkable. No pancreatic ductal dilatation or surrounding inflammatory changes. Spleen: Normal in size without focal abnormality. Adrenals/Urinary Tract: Adrenal glands are unremarkable. Kidneys are normal, without renal calculi, focal lesion, or hydronephrosis. Bladder is unremarkable. Stomach/Bowel: There is a small to moderate sized hiatal hernia. Appendix appears normal. No evidence of bowel wall thickening, distention, or inflammatory changes. Noninflamed diverticula are  seen throughout the large bowel. Vascular/Lymphatic: There is marked severity calcification of the abdominal aorta. No enlarged abdominal or pelvic lymph nodes. Reproductive: Status post hysterectomy. No adnexal masses. Other: No abdominal wall hernia or abnormality. No abdominopelvic ascites. Musculoskeletal: There is grade 1 anterolisthesis of the L4 vertebral body on L5. Mild to moderate severity multilevel degenerative changes are seen throughout the lumbar spine, most prominent at the level of L1-L2. IMPRESSION: 1. Noninflamed diverticula throughout the large bowel. 2. Small to moderate sized hiatal hernia. 3. Stable 7 mm focus of parenchymal low attenuation within the anteromedial aspect of the right lobe of the liver. This may represent a small cyst or hemangioma. 4. Grade 1 anterolisthesis of the L4 vertebral body on L5. 5. Mild to moderate severity multilevel degenerative changes throughout the lumbar spine, most prominent at the level of L1-L2. 6. Aortic atherosclerosis. Aortic Atherosclerosis (ICD10-I70.0). Electronically Signed   By: Aram Candela M.D.   On: 03/22/2020 17:57   DG Chest Portable 1 View  Result Date: 03/22/2020 CLINICAL DATA:  Allergic reaction.  History of asthma. EXAM: PORTABLE CHEST 1 VIEW COMPARISON:  Chest radiograph 03/22/2020. FINDINGS: Monitoring leads overlie the patient. Stable cardiac and mediastinal contours. No consolidative pulmonary opacities. No pleural effusion or pneumothorax. Thoracic spine degenerative changes. IMPRESSION: No acute cardiopulmonary process. Electronically Signed   By: Annia Belt M.D.   On: 03/22/2020 16:52   DG Chest Portable 1 View  Result Date: 03/22/2020 CLINICAL DATA:  Elevated white blood cell count, hypoxia, sepsis EXAM: PORTABLE CHEST 1 VIEW COMPARISON:  02/21/2020 FINDINGS: Frontal view of the chest demonstrates stable postsurgical changes from CABG. The cardiac silhouette is unremarkable. No airspace disease, effusion, or  pneumothorax. No acute bony abnormality. IMPRESSION: 1. No acute intrathoracic process. Electronically Signed   By: Sharlet Salina M.D.   On: 03/22/2020 15:19      ASSESSMENT/PLAN   Acute exacerbation of COPD with Asthma Overlap -patient reports allergy to beta agonists but has been using Stiolto , albuterol and Trelegy on outpatient  -she seems tangential in speech and history is choppy  - agree with current therapy -Solumedrol, xopenex, atrovent MDI and Levaquin.  - reviewed CT chest with no worrisome findings - there is component of acute bronchitis -will send off RVP, MRSA nasal swab, respiratory culture, and procalcitonin trend -patient reports febrile illness -leukocytosis is significant and patient denies having steroids prior to arrival  -patient has component of anxiety , states she cannot sleep has had "1hr of sleep in past 26 hours".     Thank you for allowing me to participate in the care of this patient.   Patient/Family are satisfied with care plan and all questions have been answered.  This document was prepared using Dragon voice recognition software and may include unintentional dictation errors.     Ottie Glazier, M.D.  Division of [facility]

## 2020-03-23 NOTE — Hospital Course (Signed)
[name]  is a 64 y.o. Caucasian female with a history of asthma and COPD, hypertension, CAD s/p four-vessel CABG and migraine, who presented to the ED on 03/22/20 with chest tightness with mild dyspnea and wheezing.  Reported associated low grade fevers and tachycardia at rest with occasional paroxysmal nocturnal dyspnea.  In the ED, temp initially 74F >> 100F, BP uncontrolled at 206/102, HR 105 to 120's.  Labs notable for K 2.9, glucose 248, BNP 255.4, troponin 31 >> 42, initial lactate 4.3 >> 2.1.  CBC with leukocytosis of 29.5k with neutrophilia and thrombocytosis.  UA showed glucosuria, no infection.  COVID-19 PCR negative.  (Not yet vaccinated).  CXR negative for acute findings.  CT's chest/abdomen/pelvis showed severe calcification of the aortic arch with no other acute chest abnormalities, small to moderate sized hiatal hernia and noninflamed diverticula throughout the large bowel and hepatic small cyst or hemangioma, grade 1 anterolisthesis of L4 on L5, aortic atherosclerosis and mild to moderate severity multilevel degenerative changes throughout the lumbar spine most prominent at L1-L2.  Patient was treated with Cefepime in the ED, subsequently developed anaphylactic reaction for which IM epinephrine, IV Benadryl, IV Pepcid and 125 mg Solu-Medrol were given.  Small LR bolus and IV Vancomycin, and albuterol neb was given.  Patient admitted to [facility] on hospitalist service for further evaluation and management.

## 2020-03-23 NOTE — Progress Notes (Signed)
Blood glucose was 327 when checked in the ED, pt was given 11 units novolog.  Just now glucose rechecked and is 363.  Hospitalist notified and does not was to give any additional doses of insulin.

## 2020-03-23 NOTE — Progress Notes (Signed)
Pts HR per telemetry is in 120s ST. This is not an acute change. Pts HR has been elevated since admission. RN will discuss with rounding MD during rounds. I will continue to assess.

## 2020-03-23 NOTE — ED Notes (Addendum)
Pt given meal tray and juice/drink as requested

## 2020-03-23 NOTE — Progress Notes (Addendum)
PROGRESS NOTE    [name]   ZHY:[id number]  DOB: 12/16/1955  PCP: Patient, No Pcp Per    DOA: 03/22/2020 LOS: 1   Brief Narrative   [name]  is a 64 y.o. Caucasian female with a history of asthma and COPD, hypertension, CAD s/p four-vessel CABG and migraine, who presented to the ED on 03/22/20 with chest tightness with mild dyspnea and wheezing.  Reported associated low grade fevers and tachycardia at rest with occasional paroxysmal nocturnal dyspnea.  In the ED, temp initially 73F >> 100F, BP uncontrolled at 206/102, HR 105 to 120's.  Labs notable for K 2.9, glucose 248, BNP 255.4, troponin 31 >> 42, initial lactate 4.3 >> 2.1.  CBC with leukocytosis of 29.5k with neutrophilia and thrombocytosis.  UA showed glucosuria, no infection.  COVID-19 PCR negative.  (Not yet vaccinated).  CXR negative for acute findings.  CT's chest/abdomen/pelvis showed severe calcification of the aortic arch with no other acute chest abnormalities, small to moderate sized hiatal hernia and noninflamed diverticula throughout the large bowel and hepatic small cyst or hemangioma, grade 1 anterolisthesis of L4 on L5, aortic atherosclerosis and mild to moderate severity multilevel degenerative changes throughout the lumbar spine most prominent at L1-L2.  Patient was treated with Cefepime in the ED, subsequently developed anaphylactic reaction for which IM epinephrine, IV Benadryl, IV Pepcid and 125 mg Solu-Medrol were given.  Small LR bolus and IV Vancomycin, and albuterol neb was given.  [facility] hospitalist service for further evaluation and management.       Assessment & Plan   Principal Problem:   Asthma exacerbation Active Problems:   Anaphylactic reaction   Hypertensive urgency   Hyperlipidemia   Migraine   PERIPHERAL VASCULAR DISEASE   DEGENERATIVE DISC DISEASE, LUMBAR SPINE   Essential hypertension   Coronary artery disease of native artery of native heart with stable angina  pectoris (HCC)   Hx of CABG   Sepsis secondary to acute bronchitis with secondary Asthma and COPD acute exacerbation Patient presented with fever, leukocytosis and lactic acidosis.  Has not been hypoxic. --Xopenex nebs q6h prn (due to tachycardia) --Atrovent MDI q6h --IV Solu-medrol --Levaquin --hold home Stiolto respimat --pulmonology consult per pt request, appreciated  Tachycardia - POA, persistent today.  Suspect due to steroids, ?paradoxical reaction to Benadryl.  She reports allergy to beta blockers.  Has diltiazem on med history, it was ordered by patient refused, stating it raises her BP in minutes.   --Telemetry  --Benadryl d/c'd --Reduce steroids as quickly as symptoms allow  Elevated BNP - POA with BNP 255.3.  Suspect diastolic CHF vs early cor pulmonale, possibly due to HTN urgency.  No known history of CHF.  History provided indicates possible PND/orthopnea. --Echo pending --IV Lasix --monitor I/O's & daily weights  Hypertensive urgency - POA.  Improved. Essential Hypertension - continue home amlodipine, PRN hydralazine.  Anaphylaxis secondary to cefepime/cephalosporins - occurred in the ED, resolved with treatment outlined above.  Continue IV steroids for asthma/COPD.  IV Pepcid and Benadryl were continued today, d/c this evening if stable and no further symptoms.  Hypokalemia - replaced.  Monitor and replace as needed.  Type 2 diabetes mellitus, uncontrolled - A1c 7.9%. --sliding scale NovoLog and CBG's AC/HS  Tobacco abuse - nicotine patch available.  Smoking cessation counseling.  PVD - continue ASA.  Overweight, near obesity: Body mass index is 26.91 kg/m.  Complicates overall care and prognosis.  DVT prophylaxis: Lovenox  Diet:  Diet Orders (  From admission, onward)    Start     Ordered   03/22/20 2103  Diet Carb Modified Fluid consistency: Thin; Room service appropriate? Yes  Diet effective now    Comments: HEART HEALTHY LOW SALT  Question  Answer Comment  Diet-HS Snack? Nothing   Calorie Level Medium 1600-2000   Fluid consistency: Thin   Room service appropriate? Yes      03/22/20 2102            Code Status: Full Code    Subjective 03/23/20    Patient seen at bedside.  Reports headache.  Talks at length about adverse reactions to many medications.  Agrees to try Toradol for headache.  Uses Advil at home but says she cannot take generic ibuprofen.  Says breathing improved somewhat.   Disposition Plan & Communication   Status is: Inpatient  Inpatient status remains appropriate due to severity of illness and need for IV therapies at this time.  Dispo: The patient is from: Home              Anticipated d/c is to: Home              Anticipated d/c date is: 2 days              Patient currently is not medically stable for discharge.        Family Communication: none at bedside will attempt to call   Consults, Procedures, Significant Events   Consultants:   Pulmonology   Antimicrobials:   Levaquin 6/5 >>  Significant Events: Anaphylactic reaction to Cefepime in ED on 03/22/20  Objective   Vitals:   03/23/20 0313 03/23/20 0321 03/23/20 0500 03/23/20 0719  BP: (!) 165/74   (!) 167/74  Pulse: (!) 112 (!) 106  (!) 126  Resp: 20   18  Temp: 98.9 F (37.2 C)   98.4 F (36.9 C)  TempSrc: Oral   Oral  SpO2: (!) 89% 94%  92%  Weight:   68.9 kg   Height:        Intake/Output Summary (Last 24 hours) at 03/23/2020 0811 Last data filed at 03/23/2020 5681 Gross per 24 hour  Intake 2590.02 ml  Output 4075 ml  Net -1484.98 ml   Filed Weights   03/22/20 1341 03/22/20 2029 03/23/20 0500  Weight: 68.9 kg 68.9 kg 68.9 kg    Physical Exam:  General exam: awake, alert, no acute distress HEENT: no oropharyngeal swelling, moist mucus membranes, hearing grossly normal  Respiratory system: decreased breath sounds with expiratory wheezes worse on right than left, no rhonchi, normal respiratory  effort. Cardiovascular system: normal S1/S2, tachycardic, regular rhythm, no pedal edema.   Gastrointestinal system: soft, NT, ND, no HSM felt, +bowel sounds. Central nervous system: A&O x4 no gross focal neurologic deficits, normal speech Extremities: moves all, no edema, normal tone Skin: dry, intact, normal temperature, normal color Psychiatry: anxious mood, congruent affect, extremely talkative with tangential thought content  Labs   Data Reviewed: I have personally reviewed following labs and imaging studies  CBC: Recent Labs  Lab 03/22/20 1345 03/23/20 0527  WBC 29.5* 29.0*  NEUTROABS 25.5* 26.6*  HGB 13.7 13.3  HCT 41.3 40.3  MCV 84.8 86.1  PLT 525* 457*   Basic Metabolic Panel: Recent Labs  Lab 03/22/20 1345 03/23/20 0527  NA 136 138  K 2.9* 3.3*  CL 97* 96*  CO2 24 28  GLUCOSE 248* 196*  BUN 10 8  CREATININE 0.86 0.97  CALCIUM  9.5 9.0  MG  --  1.7   GFR: Estimated Creatinine Clearance: 56 mL/min (by C-G formula based on SCr of 0.97 mg/dL). Liver Function Tests: Recent Labs  Lab 03/22/20 1345  AST 40  ALT 18  ALKPHOS 121  BILITOT 0.6  PROT 8.0  ALBUMIN 4.3   No results for input(s): LIPASE, AMYLASE in the last 168 hours. No results for input(s): AMMONIA in the last 168 hours. Coagulation Profile: Recent Labs  Lab 03/22/20 1345  INR 1.0   Cardiac Enzymes: No results for input(s): CKTOTAL, CKMB, CKMBINDEX, TROPONINI in the last 168 hours. BNP (last 3 results) No results for input(s): PROBNP in the last 8760 hours. HbA1C: No results for input(s): HGBA1C in the last 72 hours. CBG: Recent Labs  Lab 03/23/20 0151 03/23/20 0322  GLUCAP 327* 363*   Lipid Profile: No results for input(s): CHOL, HDL, LDLCALC, TRIG, CHOLHDL, LDLDIRECT in the last 72 hours. Thyroid Function Tests: No results for input(s): TSH, T4TOTAL, FREET4, T3FREE, THYROIDAB in the last 72 hours. Anemia Panel: No results for input(s): VITAMINB12, FOLATE, FERRITIN, TIBC,  IRON, RETICCTPCT in the last 72 hours. Sepsis Labs: Recent Labs  Lab 03/22/20 1345 03/22/20 1703 03/23/20 0527  LATICACIDVEN 4.3* 2.1* 3.4*    Recent Results (from the past 240 hour(s))  Culture, blood (Routine x 2)     Status: None (Preliminary result)   Collection Time: 03/22/20  1:45 PM   Specimen: BLOOD  Result Value Ref Range Status   Specimen Description BLOOD LEFT FOREARM  Final   Special Requests   Final    BOTTLES DRAWN AEROBIC AND ANAEROBIC Blood Culture adequate volume   Culture   Final    NO GROWTH < 24 HOURS Performed at [facility], [address], [place], [place] [address]    Report Status PENDING  Incomplete  Culture, blood (Routine x 2)     Status: None (Preliminary result)   Collection Time: 03/22/20  2:19 PM   Specimen: BLOOD  Result Value Ref Range Status   Specimen Description BLOOD BLOOD LEFT FOREARM  Final   Special Requests   Final    BOTTLES DRAWN AEROBIC AND ANAEROBIC Blood Culture results may not be optimal due to an excessive volume of blood received in culture bottles   Culture   Final    NO GROWTH < 24 HOURS Performed at [facility], [address], [place], [place] [address]    Report Status PENDING  Incomplete  SARS Coronavirus 2 by RT PCR (hospital order, performed in [facility] hospital lab) Nasopharyngeal Nasopharyngeal Swab     Status: None   Collection Time: 03/22/20  2:19 PM   Specimen: Nasopharyngeal Swab  Result Value Ref Range Status   SARS Coronavirus 2 NEGATIVE NEGATIVE Final    Comment: (NOTE) SARS-CoV-2 target nucleic acids are NOT DETECTED. The SARS-CoV-2 RNA is generally detectable in upper and lower respiratory specimens during the acute phase of infection. The lowest concentration of SARS-CoV-2 viral copies this assay can detect is 250 copies / mL. A negative result does not preclude SARS-CoV-2 infection and should not be used as the sole basis for treatment or other patient management decisions.   A negative result may occur with improper specimen collection / handling, submission of specimen other than nasopharyngeal swab, presence of viral mutation(s) within the areas targeted by this assay, and inadequate number of viral copies (<250 copies / mL). A negative result must be combined with clinical observations, patient history, and epidemiological  information. Fact Sheet for Patients:   StrictlyIdeas.no Fact Sheet for Healthcare Providers: BankingDealers.co.za This test is not yet approved or cleared  by the [place] FDA and has been authorized for detection and/or diagnosis of SARS-CoV-2 by FDA under an Emergency Use Authorization (EUA).  This EUA will remain in effect (meaning this test can be used) for the duration of the COVID-19 declaration under Section 564(b)(1) of the Act, 21 U.S.C. section 360bbb-3(b)(1), unless the authorization is terminated or revoked sooner. Performed at [facility], [address] [place], [place] [address]       Imaging Studies   CT CHEST W CONTRAST  Result Date: 03/22/2020 CLINICAL DATA:  Cough, shortness of breath, chest tightness and congestion. EXAM: CT CHEST, ABDOMEN, AND PELVIS WITH CONTRAST TECHNIQUE: Multidetector CT imaging of the chest, abdomen and pelvis was performed following the standard protocol during bolus administration of intravenous contrast. CONTRAST:  150mL OMNIPAQUE IOHEXOL 300 MG/ML  SOLN COMPARISON:  December 11, 2016 FINDINGS: CT CHEST FINDINGS Cardiovascular: There is marked severity calcification of the aortic arch. Normal heart size. No pericardial effusion. Mediastinum/Nodes: No enlarged mediastinal, hilar, or axillary lymph nodes. Thyroid gland, trachea, and esophagus demonstrate no significant findings. Lungs/Pleura: Lungs are clear. No pleural effusion or pneumothorax. Musculoskeletal: Multiple sternal wires are seen. Degenerative changes are seen within  the thoracic spine. CT ABDOMEN PELVIS FINDINGS Hepatobiliary: A stable 7 mm focus of parenchymal low attenuation is seen within the anteromedial aspect of the right lobe of the liver (axial CT image 46, CT series number 507). No gallstones, gallbladder wall thickening, or biliary dilatation. Pancreas: Unremarkable. No pancreatic ductal dilatation or surrounding inflammatory changes. Spleen: Normal in size without focal abnormality. Adrenals/Urinary Tract: Adrenal glands are unremarkable. Kidneys are normal, without renal calculi, focal lesion, or hydronephrosis. Bladder is unremarkable. Stomach/Bowel: There is a small to moderate sized hiatal hernia. Appendix appears normal. No evidence of bowel wall thickening, distention, or inflammatory changes. Noninflamed diverticula are seen throughout the large bowel. Vascular/Lymphatic: There is marked severity calcification of the abdominal aorta. No enlarged abdominal or pelvic lymph nodes. Reproductive: Status post hysterectomy. No adnexal masses. Other: No abdominal wall hernia or abnormality. No abdominopelvic ascites. Musculoskeletal: There is grade 1 anterolisthesis of the L4 vertebral body on L5. Mild to moderate severity multilevel degenerative changes are seen throughout the lumbar spine, most prominent at the level of L1-L2. IMPRESSION: 1. Noninflamed diverticula throughout the large bowel. 2. Small to moderate sized hiatal hernia. 3. Stable 7 mm focus of parenchymal low attenuation within the anteromedial aspect of the right lobe of the liver. This may represent a small cyst or hemangioma. 4. Grade 1 anterolisthesis of the L4 vertebral body on L5. 5. Mild to moderate severity multilevel degenerative changes throughout the lumbar spine, most prominent at the level of L1-L2. 6. Aortic atherosclerosis. Aortic Atherosclerosis (ICD10-I70.0). Electronically Signed   By: Virgina Norfolk M.D.   On: 03/22/2020 17:58   CT ABDOMEN PELVIS W CONTRAST  Result Date:  03/22/2020 CLINICAL DATA:  Cough, shortness of breath, chest tightness and congestion. EXAM: CT CHEST, ABDOMEN, AND PELVIS WITH CONTRAST TECHNIQUE: Multidetector CT imaging of the chest, abdomen and pelvis was performed following the standard protocol during bolus administration of intravenous contrast. CONTRAST:  170mL OMNIPAQUE IOHEXOL 300 MG/ML  SOLN COMPARISON:  Feb 21, 2020 FINDINGS: CT CHEST FINDINGS Cardiovascular: There is marked severity calcification of the aortic arch. Normal heart size. No pericardial effusion. Mediastinum/Nodes: No enlarged mediastinal, hilar, or axillary lymph nodes. Thyroid gland, trachea,  and esophagus demonstrate no significant findings. Lungs/Pleura: Lungs are clear. No pleural effusion or pneumothorax. Musculoskeletal: Multiple sternal wires are seen. Degenerative changes are seen within the thoracic spine. CT ABDOMEN PELVIS FINDINGS Hepatobiliary: A stable 7 mm focus of parenchymal low attenuation is seen within the anteromedial aspect of the right lobe of the liver (axial CT image 46, CT series number 507). No gallstones, gallbladder wall thickening, or biliary dilatation. Pancreas: Unremarkable. No pancreatic ductal dilatation or surrounding inflammatory changes. Spleen: Normal in size without focal abnormality. Adrenals/Urinary Tract: Adrenal glands are unremarkable. Kidneys are normal, without renal calculi, focal lesion, or hydronephrosis. Bladder is unremarkable. Stomach/Bowel: There is a small to moderate sized hiatal hernia. Appendix appears normal. No evidence of bowel wall thickening, distention, or inflammatory changes. Noninflamed diverticula are seen throughout the large bowel. Vascular/Lymphatic: There is marked severity calcification of the abdominal aorta. No enlarged abdominal or pelvic lymph nodes. Reproductive: Status post hysterectomy. No adnexal masses. Other: No abdominal wall hernia or abnormality. No abdominopelvic ascites. Musculoskeletal: There is grade 1  anterolisthesis of the L4 vertebral body on L5. Mild to moderate severity multilevel degenerative changes are seen throughout the lumbar spine, most prominent at the level of L1-L2. IMPRESSION: 1. Noninflamed diverticula throughout the large bowel. 2. Small to moderate sized hiatal hernia. 3. Stable 7 mm focus of parenchymal low attenuation within the anteromedial aspect of the right lobe of the liver. This may represent a small cyst or hemangioma. 4. Grade 1 anterolisthesis of the L4 vertebral body on L5. 5. Mild to moderate severity multilevel degenerative changes throughout the lumbar spine, most prominent at the level of L1-L2. 6. Aortic atherosclerosis. Aortic Atherosclerosis (ICD10-I70.0). Electronically Signed   By: Aram Candela M.D.   On: 03/22/2020 17:57   DG Chest Portable 1 View  Result Date: 03/22/2020 CLINICAL DATA:  Allergic reaction.  History of asthma. EXAM: PORTABLE CHEST 1 VIEW COMPARISON:  Chest radiograph 03/22/2020. FINDINGS: Monitoring leads overlie the patient. Stable cardiac and mediastinal contours. No consolidative pulmonary opacities. No pleural effusion or pneumothorax. Thoracic spine degenerative changes. IMPRESSION: No acute cardiopulmonary process. Electronically Signed   By: Annia Belt M.D.   On: 03/22/2020 16:52   DG Chest Portable 1 View  Result Date: 03/22/2020 CLINICAL DATA:  Elevated white blood cell count, hypoxia, sepsis EXAM: PORTABLE CHEST 1 VIEW COMPARISON:  02/21/2020 FINDINGS: Frontal view of the chest demonstrates stable postsurgical changes from CABG. The cardiac silhouette is unremarkable. No airspace disease, effusion, or pneumothorax. No acute bony abnormality. IMPRESSION: 1. No acute intrathoracic process. Electronically Signed   By: Sharlet Salina M.D.   On: 03/22/2020 15:19     Medications   Scheduled Meds: . amLODipine  5 mg Oral BID  . aspirin  81 mg Oral Daily  . diphenhydrAMINE  25 mg Intravenous Q6H  . enoxaparin (LOVENOX) injection  40  mg Subcutaneous Q24H  . furosemide  40 mg Intravenous Q12H  . guaiFENesin  600 mg Oral BID  . insulin aspart  0-15 Units Subcutaneous TID PC & HS  . ipratropium  0.5 mg Inhalation Q6H  . levalbuterol  0.63 mg Inhalation Q6H  . methylPREDNISolone (SOLU-MEDROL) injection  40 mg Intravenous Q8H  . pantoprazole  40 mg Oral Daily  . potassium chloride  40 mEq Oral Once  . sodium chloride flush  3 mL Intravenous Q12H   Continuous Infusions: . sodium chloride    . famotidine (PEPCID) IV    . levofloxacin (LEVAQUIN) IV Stopped (03/23/20 0349)  LOS: 1 day    Time spent: 46 minutes with > 50% spent in coordination of care and direct patient contact.    Pennie Banter, DO [facility]  03/23/2020, 8:11 AM    If 7PM-7AM, please contact night-coverage. How to contact the [facility] Attending or Consulting provider 7A - 7P or covering provider during after hours 7P -7A, for this patient?    1. Check the care team in [facility] and look for a) attending/consulting [facility] provider listed and b) the [facility] team listed 2. Log into [email or web address] and use [facility]'s universal password to access. If you do not have the password, please contact the hospital operator. 3. Locate the [facility] provider you are looking for under [facility] and page to a number that you can be directly reached. 4. If you still have difficulty reaching the provider, please page the [facility] (Director on Call) for the Hospitalists listed on amion for assistance.

## 2020-03-23 NOTE — ED Notes (Signed)
Pt has c/o small BP cuff, sized to adult large and BP reduced, timeliness changed from q5 min

## 2020-03-24 ENCOUNTER — Inpatient Hospital Stay (HOSPITAL_COMMUNITY)
Admit: 2020-03-24 | Discharge: 2020-03-24 | Disposition: A | Discharge status: Home / Self Care | Payer: 59 | Attending: Family Medicine | Admitting: Family Medicine

## 2020-03-24 DIAGNOSIS — I25118 Atherosclerotic heart disease of native coronary artery with other forms of angina pectoris: Secondary | ICD-10-CM

## 2020-03-24 DIAGNOSIS — I10 Essential (primary) hypertension: Secondary | ICD-10-CM

## 2020-03-24 DIAGNOSIS — T782XXS Anaphylactic shock, unspecified, sequela: Secondary | ICD-10-CM

## 2020-03-24 DIAGNOSIS — R0602 Shortness of breath: Secondary | ICD-10-CM

## 2020-03-24 DIAGNOSIS — J4521 Mild intermittent asthma with (acute) exacerbation: Secondary | ICD-10-CM

## 2020-03-24 LAB — BASIC METABOLIC PANEL
Anion gap: 17 — ABNORMAL HIGH (ref 5–15)
BUN: 19 mg/dL (ref 8–23)
CO2: 29 mmol/L (ref 22–32)
Calcium: 9.4 mg/dL (ref 8.9–10.3)
Chloride: 86 mmol/L — ABNORMAL LOW (ref 98–111)
Creatinine, Ser: 1.15 mg/dL — ABNORMAL HIGH (ref 0.44–1.00)
GFR calc Af Amer: 59 mL/min — ABNORMAL LOW (ref >60)
GFR calc non Af Amer: 51 mL/min — ABNORMAL LOW (ref >60)
Glucose, Bld: 285 mg/dL — ABNORMAL HIGH (ref 70–99)
Potassium: 3.7 mmol/L (ref 3.5–5.1)
Sodium: 132 mmol/L — ABNORMAL LOW (ref 135–145)

## 2020-03-24 LAB — CBC WITH DIFFERENTIAL/PLATELET
Abs Immature Granulocytes: 0.45 10*3/uL — ABNORMAL HIGH (ref 0.00–0.07)
Basophils Absolute: 0 10*3/uL (ref 0.0–0.1)
Basophils Relative: 0 %
Eosinophils Absolute: 0 10*3/uL (ref 0.0–0.5)
Eosinophils Relative: 0 %
HCT: 46 % (ref 36.0–46.0)
Hemoglobin: 15.3 g/dL — ABNORMAL HIGH (ref 12.0–15.0)
Immature Granulocytes: 2 %
Lymphocytes Relative: 5 %
Lymphs Abs: 1.1 10*3/uL (ref 0.7–4.0)
MCH: 28.5 pg (ref 26.0–34.0)
MCHC: 33.3 g/dL (ref 30.0–36.0)
MCV: 85.7 fL (ref 80.0–100.0)
Monocytes Absolute: 1.1 10*3/uL — ABNORMAL HIGH (ref 0.1–1.0)
Monocytes Relative: 5 %
Neutro Abs: 22.3 10*3/uL — ABNORMAL HIGH (ref 1.7–7.7)
Neutrophils Relative %: 88 %
Platelets: 571 10*3/uL — ABNORMAL HIGH (ref 150–400)
RBC: 5.37 MIL/uL — ABNORMAL HIGH (ref 3.87–5.11)
RDW: 13.3 % (ref 11.5–15.5)
WBC: 25.1 10*3/uL — ABNORMAL HIGH (ref 4.0–10.5)
nRBC: 0 % (ref 0.0–0.2)

## 2020-03-24 LAB — RESPIRATORY PANEL BY PCR

## 2020-03-24 LAB — GLUCOSE, CAPILLARY
Glucose-Capillary: 267 mg/dL — ABNORMAL HIGH (ref 70–99)
Glucose-Capillary: 256 mg/dL — ABNORMAL HIGH (ref 70–99)
Glucose-Capillary: 235 mg/dL — ABNORMAL HIGH (ref 70–99)
Glucose-Capillary: 206 mg/dL — ABNORMAL HIGH (ref 70–99)

## 2020-03-24 LAB — PROCALCITONIN: Procalcitonin: 0.18 ng/mL

## 2020-03-24 LAB — ECHOCARDIOGRAM COMPLETE
Height: 63 in
Weight: 2316.8 oz

## 2020-03-24 LAB — MAGNESIUM: Magnesium: 2.9 mg/dL — ABNORMAL HIGH (ref 1.7–2.4)

## 2020-03-24 MED ORDER — LEVOFLOXACIN IN D5W 750 MG/150ML IV SOLN
750.0000 mg | INTRAVENOUS | Status: DC
  Administered 2020-03-25: 750 mg via INTRAVENOUS
  Filled 2020-03-24: qty 150

## 2020-03-24 MED ORDER — DILTIAZEM HCL ER COATED BEADS 120 MG PO CP24
120.0000 mg | ORAL_CAPSULE | Freq: Every day | ORAL | Status: DC
  Administered 2020-03-25 – 2020-03-26 (×2): 120 mg via ORAL
  Filled 2020-03-24 (×3): qty 1

## 2020-03-24 MED ORDER — CLONAZEPAM 0.5 MG PO TABS
0.5000 mg | ORAL_TABLET | Freq: Two times a day (BID) | ORAL | Status: DC
  Administered 2020-03-24 – 2020-03-26 (×3): 0.5 mg via ORAL
  Filled 2020-03-24 (×4): qty 1

## 2020-03-24 NOTE — Progress Notes (Signed)
Inpatient Diabetes Program Recommendations  AACE/ADA: New Consensus Statement on Inpatient Glycemic Control (2015)  Target Ranges:  Prepandial:   less than 140 mg/dL      Peak postprandial:   less than 180 mg/dL (1-2 hours)      Critically ill patients:  140 - 180 mg/dL   Results for [name], [name] (MRN [id number]) as of 03/24/2020 09:44  Ref. Range 03/23/2020 01:51 03/23/2020 03:22 03/23/2020 12:12 03/23/2020 16:45  Glucose-Capillary Latest Ref Range: 70 - 99 mg/dL 387 (H)  11 units NOVOLOG  363 (H) 291 (H) 259 (H)  3 units NOVOLOG    Results for [name], [name] (MRN [id number]) as of 03/24/2020 09:44  Ref. Range 03/24/2020 07:25  Glucose-Capillary Latest Ref Range: 70 - 99 mg/dL 884 (H)  8 units NOVOLOG    Results for [name], [name] (MRN [id number]) as of 03/24/2020 09:44  Ref. Range 03/23/2020 05:27  Hemoglobin A1C Latest Ref Range: 4.8 - 5.6 % 7.9 (H)    Admit with:  Asthma and COPD acute exacerbation likely secondary to acute bronchitis possibly with mild sepsis/ HTN Urgency  Home DM Meds: None listed   Current Orders: Novolog Moderate Correction Scale/ SSI (0-15 units) TID AC + HS    Solumedrol stopped--last dose given at 6:45am today.  Hopeful CBGs will improve now that steroids stopped.  Will follow and assist with glucose management.     --Will follow patient during hospitalization--  Ambrose Finland RN, MSN, CDE Diabetes Coordinator Inpatient Glycemic Control Team Team Pager: [phone] (8a-5p)

## 2020-03-24 NOTE — Progress Notes (Signed)
Per CCMD pt has 7 bts of Vtach. MD notified. No new orders at this time. I will continue to assess.

## 2020-03-24 NOTE — Clinical Social Work Note (Addendum)
Pt reportedly has a scale at home. Pt states she weighs herself daily. Pt was reminded that if any weight gain occurs to contact her doctor. Pt states she is actively trying to get herself a PCP. CSW will reach out to [facility].  [place], [place] [phone]

## 2020-03-24 NOTE — Consult Note (Signed)
Cardiology Consultation:   Patient ID: [name] MRN: [id number]; DOB: Mar 02, 1956  Admit date: 03/22/2020 Date of Consult: 03/24/2020  Primary Care Provider: Patient, No Pcp Per Primary Cardiologist: [facility], Dr. Kirke Corin rounding Primary Electrophysiologist:  None    Patient Profile:   [name] is a 64 y.o. female with a hx of coronary artery disease with stable angina pectoris s/p CABG in 2017, hypertension, hyperlipidemia, HTN, multiple medication intolerances including BB and statins/Repatha, renal artery stenosis (2018 [place] unable to be viewed on EMR, 70%s), ST, palpitations, DM2, chronic back pain, COPD, asthma, previous tobacco use (quit 2 weeks ago in late 02/2020), CKD, anxiety, self reported C diff, diverticulosis of large intestine without hemorrhage 03/22/2007, IBS/GERD, hiatal hernia, migraines, history of adrenal abnormality, and who is being seen today for the evaluation of CAD and establishment of care at the request of Dr. Sherryll Burger.  History of Present Illness:   [name] is a 64 year old female with previous cardiac care provided by [facility].  She has a complex PMH as outlined above. Family history includes a father with heart dz that died in his 57s 2/2 tobacco use / CA. She recently quit smoking 2 weeks ago with previous tobacco use 1/4 pack a day. She has multiple medication intolerances, including BB, statins, and Repatha.  She reports migraines and elevated blood pressure with beta-blockers, GI issues with statins, and being "knocked out flat for 10 days" with Repatha.  She underwent 2017 CABG as above.  Most recent echo as below shows EF normal.  2018 monitor showed avg HR 96bpm, max HR 148bpm, min HR 70bpm, rare PVCs/PACs, and as copied and pasted below. 2018 RAS scan showed stenosis without obstruction but full writeup not available on CareEverywhere. She was last seen by [facility] on 12/07/2019.  At that time, she  denied any chest pain or shortness of breath.  BP 166/96 with HR 100 bpm and oxygen saturation 92%.  Weight was 146 pounds.  She had recently had a clonidine changed from one 0.2 mg tablet qd to 0.1 mg twice daily to reduce side effects.  Risk of rebound hypertension with clonidine was discussed with the patient during this visit.  She still was smoking approximately 1/4 pack a day.  EKG NSR with nonspecific TW abnormalities in the lateral leads.    She was last seen in [facility] ED 02/21/2020 with report of abdominal pain and headache.  She also noted concern regarding elevated blood pressure and dizziness.  She reported right-sided migraine, as well as a personal history of C. Difficile.  She was started on amlodipine for her blood pressure with patient reporting today that she is tolerating the medication fairly well but needs a nighttime dose.  She denies any significant swelling on this medication.  She reports that she feels the amlodipine has also improved her volume status, and she has lost several pounds that she feels more water weight over the last month.  She reports that for the last week she has experienced intermittent chest pain that does not radiate and feels like a soreness or tightness in quality.  She reports that this pain is different from that before her CABG, given that it does not radiate deep into her left back like in the past.  In addition, she noted shortness of breath and dyspnea on exertion.  She reported a low-grade fever that was up to 99.4 earlier in the day on 03/22/2020. She felt intermittent  racing HR, palpitations, and dizziness. She denied any syncope or recent falls.  She reported orthopnea, PND, and LEE; however, she denied abdominal distention and early satiety.  She presented to the ED with initial vitals 112 bpm, 179/82, 100% on room air.  Labs showed sodium 136, potassium 2.9, glucose 248, creatinine 0.86, BUN 10, AST 40, ALT 18, BNP 255.4, high-sensitivity troponin 31  42,  lactic acid 4.3   2.1, Hgb 13.7, CBC showed leukocytosis and neutrophilia. Chest CT showed severe calcification of the aortic arch. Abdominal and pelvic CT showed small to moderate sized hiatel hernia and non-inflamed diverticula throughout the large bowel, hepatic cyst versus hemangioma, L4/L5 anterolisthesis, aortic atherosclerosis, and degenerative changes of L1-L2.  She reported she quit smoking 2 weeks ago. She reported rare alcohol use. She noted a healthy diet and that she did not eat out regularly or get a lot of salt in her food. She wanted to establish with [facility] cardiology, given her frustration with her previous cardiology group regarding her medications and BP management.  She was admitted for asthma and COPD exacerbation likely secondary to acute bronchitis with possible mild sepsis.  Heart Pathway Score:     Past Medical History:  Diagnosis Date   Asthma    Benign essential HTN    COPD (chronic obstructive pulmonary disease) (HCC)    IBS (irritable bowel syndrome)    Migraine headache     History reviewed. No pertinent surgical history.   Home Medications:  Prior to Admission medications   Medication Sig Start Date End Date Taking? Authorizing Provider  albuterol (PROVENTIL HFA;VENTOLIN HFA) 108 (90 BASE) MCG/ACT inhaler Inhale 2 puffs into the lungs 3 (three) times daily as needed for wheezing or shortness of breath.   Yes [provider]  amLODipine (NORVASC) 5 MG tablet Take 1 tablet (5 mg total) by mouth daily. Patient taking differently: Take 5 mg by mouth 2 (two) times daily.  02/21/20 02/20/21 Yes Willy Eddy, MD  pantoprazole (PROTONIX) 40 MG tablet Take 40 mg by mouth daily. 02/12/20  Yes [provider]  albuterol (PROVENTIL) (2.5 MG/3ML) 0.083% nebulizer solution Take 2.5 mg by nebulization every 6 (six) hours as needed for wheezing or shortness of breath.    [provider]  aspirin 81 MG chewable tablet Chew 81 mg by mouth daily.  05/04/16   [provider]  diphenoxylate-atropine (LOMOTIL) 2.5-0.025 MG per tablet Take 1-2 tablets by mouth 3 (three) times daily as needed for diarrhea or loose stools (for bowel spasms).    [provider]  EPINEPHrine (EPIPEN IJ) Inject 0.3 mg as directed as needed (Bee Stings).    [provider]  fluticasone (FLONASE) 50 MCG/ACT nasal spray Place 1-2 sprays into both nostrils daily at 6 (six) AM. 03/21/20   [provider]  hyoscyamine (LEVSIN SL) 0.125 MG SL tablet Take 0.125-0.25 mg by mouth every 4 (four) hours as needed for cramping.    [provider]  promethazine (PHENERGAN) 12.5 MG tablet Take 1 tablet (12.5 mg total) by mouth every 6 (six) hours as needed. 02/21/20   Willy Eddy, MD  promethazine (PHENERGAN) 25 MG suppository Place 12.5 mg rectally every 8 (eight) hours as needed for nausea or vomiting.    [provider]  ranitidine (ZANTAC) 150 MG tablet Take 150 mg by mouth daily as needed. 05/05/17   [provider]  Tiotropium Bromide-Olodaterol (STIOLTO RESPIMAT) 2.5-2.5 MCG/ACT AERS Inhale 2 puffs into the lungs daily. 02/07/17   [provider]    Inpatient Medications: Scheduled Meds:  amLODipine  5 mg Oral Daily   aspirin  81 mg Oral Daily   clonazePAM  0.5 mg Oral BID   enoxaparin (LOVENOX) injection  40 mg Subcutaneous Q24H   famotidine  20 mg Oral Daily   furosemide  40 mg Intravenous BID   guaiFENesin  600 mg Oral BID   insulin aspart  0-15 Units Subcutaneous TID PC & HS   ipratropium  2.5 mL Inhalation Q6H   pantoprazole  40 mg Oral Daily   sodium chloride flush  3 mL Intravenous Q12H   Continuous Infusions:  sodium chloride     [START ON 03/25/2020] levofloxacin (LEVAQUIN) IV     PRN Meds: sodium chloride, chlorpheniramine-HYDROcodone, fluticasone, hydrALAZINE, HYDROmorphone, ketorolac, levalbuterol, ondansetron (ZOFRAN) IV, sodium chloride flush, zolpidem  Allergies:      Allergies  Allergen Reactions   Cefepime Anaphylaxis, Shortness Of Breath, Swelling and Dermatitis   Edoxaban Other (See Comments)    Causes hematomas   Acetaminophen     Will not take after Cyanide deaths, something changed when they mkt'd again.  Can take Childrens Liquid but sometimes upset stomach too   Aspirin     Makes anemic   Beta Adrenergic Blockers     Paradoxical reactions   Fentanyl And Related     fever   Lisinopril     Can only take her own mfr.     Midazolam Hcl     fever   Prednisone     REACTION: ibs probs   Propofol     REACTION: FEVER   Statins     BP increases   Sulfamethoxazole-Trimethoprim     REACTION: antibiotic induced colitis    Social History:   Social History   Socioeconomic History   Marital status: Married    Spouse name: Not on file   Number of children: Not on file   Years of education: Not on file   Highest education level: Not on file  Occupational History   Not on file  Tobacco Use   Smoking status: Heavy Tobacco Smoker    Packs/day: 1.00    Types: Cigarettes   Smokeless tobacco: Never Used  Substance and Sexual Activity   Alcohol use: Yes    Alcohol/week: 0.0 standard drinks    Comment: Occasional to Rare   Drug use: No   Sexual activity: Not on file  Other Topics Concern   Not on file  Social History Narrative   Not on file   Social Determinants of Health   Financial Resource Strain:    Difficulty of Paying Living Expenses:   Food Insecurity:    Worried About Programme researcher, broadcasting/film/video in the Last Year:    Barista in the Last Year:   Transportation Needs:    Freight forwarder (Medical):    Lack of Transportation (Non-Medical):   Physical Activity:    Days of Exercise per Week:    Minutes of Exercise per Session:   Stress:    Feeling of Stress :   Social Connections:    Frequency of Communication with Friends and Family:    Frequency of Social Gatherings with Friends  and Family:    Attends Religious Services:    Active Member of Clubs or Organizations:    Attends Banker Meetings:    Marital Status:   Intimate Partner Violence:    Fear of Current or Ex-Partner:    Emotionally Abused:  Physically Abused:    Sexually Abused:     Family History:    Family History  Problem Relation Age of Onset   Hypertension Mother    CAD Brother        Deceased on MI age 4   Cancer - Other Father        Oropharyngeal SCCa     ROS:  Please see the history of present illness.  Review of Systems  Constitutional: Positive for fever, malaise/fatigue and weight loss.       Reports weight loss due to water weight  Respiratory: Positive for shortness of breath and wheezing. Negative for hemoptysis.   Cardiovascular: Positive for chest pain, palpitations, orthopnea, leg swelling and PND.  Gastrointestinal: Positive for abdominal pain.  Genitourinary: Negative for hematuria.  Musculoskeletal: Positive for myalgias. Negative for falls.  Neurological: Positive for dizziness, weakness and headaches. Negative for loss of consciousness.    All other ROS reviewed and negative.     Physical Exam/Data:   Vitals:   03/24/20 0356 03/24/20 0723 03/24/20 0824 03/24/20 1215  BP:  (!) 163/87  (!) 151/75  Pulse:  (!) 122 (!) 114 (!) 102  Resp: Temp:  98.3 F (36.8 C)  98.5 F (36.9 C)  TempSrc:  Oral  Oral  SpO2:  93% 95% 95%  Weight:      Height:        Intake/Output Summary (Last 24 hours) at 03/24/2020 1501 Last data filed at 03/24/2020 1353 Gross per 24 hour  Intake 560 ml  Output 2300 ml  Net -1740 ml   Last 3 Weights 03/24/2020 03/23/2020 03/22/2020  Weight (lbs) 144 lb 12.8 oz 151 lb 14.4 oz 151 lb 14.4 oz  Weight (kg) 65.681 kg 68.9 kg 68.9 kg     Body mass index is 25.65 kg/m.  General:  Well nourished, well developed, in no acute distress HEENT: normal Neck: no JVD Vascular: No carotid bruits; radial pulses 2+  bilaterally Cardiac:  normal S1, S2; tachycardic but regular; no murmur  Lungs: Bilateral wheezing, bilateral reduced breath sounds Abd: soft, nontender, no hepatomegaly  Ext: no edema Musculoskeletal:  No deformities, BUE and BLE strength normal and equal Skin: warm and dry  Neuro:  No focal abnormalities noted Psych:  Normal affect   EKG:  The EKG was personally reviewed and demonstrates:  Including beta-blockers sinus tachycardia, 133 bpm, baseline wander, borderline IVCD with repolarization abnormality  Telemetry:  Telemetry was personally reviewed and demonstrates: NSR, ST with rare rates into the 140s  Relevant CV Studies:  48h monitor 03/21/20 Rhythm Findings: Basic rhythm was sinus. Average HR was 96 BPM. Maximum HR was 148 BPM. Minimum HR was 70 BPM. 1. Ventricular: rare PVCs total of 235. 2. Supraventricular: rare PACs total of 118. 3. Bradyarrhythmias and Pauses: No Symptoms reported: None. CONCLUSIONS: 1. Unremarkable Holter monitor.  2. Arrhythmia as described 3. Symptoms were not reported  4. Symptoms were not correlated with arrhythmias Georgeanna Lea, MD [facility] 03/21/17 7:02 PM   Cath 2017 1. Angina pectoris. 2. Severe stenosis, mid LMCA. 3. IVUS demonstrated severe, eccentric, fibro-fatty plaque in the mid LMCA. 4. Severe stenosis, proximal ramus. 5. Otherwise, mild nonobstructive CAD. 6. Normal LV systolic function, EF 55-60% SYNTAX score = 16. Diagnostic Procedure Recommendations 1. CTS consult to consider CABG to LAD, LCx, Ramus.  Signatures  Electronically signed by Tollie Pizza, DO, FACC(Diagnostic  Physician) on 04/28/2016 10:44  Angiographic findings  Cardiac Arteries and Lesion  Findings LMCA: Abnormal. Mid - 75%  Lesion on LMCA: Mid subsection.75% stenosis 8 mm length . Pre procedure  TIMI III flow was noted. The lesion was diagnosed as High Risk (C). The  lesion was discrete, eccentric and lightly calcified.The lesion  showed  evidence of not present thrombus, mild angulation and mild  tortuosity.Bifurcation lesion.  Pre PCI IVUS:  The Plaque  Composition is  Fibro-fatty. LAD: Normal appearance with 0% stenosis. LCx: Abnormal. Proximal - 20% Mid - 30% Distal - 40% OM1 - 0% RCA: Abnormal. Mid - 30% Ramus: Abnormal. Proximal - 90% Procedure Data Procedure Date Date: 04/28/2016 Start: 09:33 Contrast Material  - Omnipaque64 ml Fluoroscopy Time: Diagnostic: 5:07 minutes. PCI: 0:00 minutes. Total: 5:07 minutes. Admission Data Admission Date: 04/28/2016  Coronary Tree  Dominance: Right VA LV function assessed [id number]. Ejection Fraction  - Method: Estimated. EF%: 55.  LVA Segment Contractility  1 - Normal    3 - Mild    5 - Severe   7 - Dyskinesis  hypokinesis   hypokinesis  2 - Hypokinesis 4 - Moderate  6 - Akinesis  8 - Aneurysm  hypokinesis  Hemodynamics  Condition: Rest  Heart Rate: 66 bpm Pressures +-----+------------+ !Site !Pressure  ! +-----+------------+ !LV  !200/5 ,19  ! +-----+------------+ !AO  !190/86 (134)! +-----+------------+ !AO  !198/79 (128)! +-----+------------+  Other Result Information  Interface, Rad Results In - 04/28/2016 10:45 AM EDT Formatting of this note might be different from the original. Cardiac Diagnostic Report  Demographics  Patient     [facility]        Date of    10/25/1955  Height  Name        ADELLE        Birth              [place]  Patient     [id number]   Age        68 year(s)  Weight  Number  Visit       [id number]    Gender     Female      BSA  Number  Accession   [id number] HP  Race       Caucasian   BMI  Number                             Room       Cath Lab    Date of Study  04/28/2016                             Number     Pool Room                                        [facility]  Referring   Glean Hess          Diagnostic [place] Ana        Interventional [place] Ana  Physician   Revankar, MD   Physician   Reuel Boom DO, Physician      Reuel Boom DO,                                        [facility]  [facility] Diagnostic Cath Status: Elective Procedure Procedure Type  Diagnostic procedure: Left Heart Cath , Selective Coronaries, Left  Ventricular Injection, IVUS Complications: No Complications. Conclusions Diagnostic Procedure Summary I have reviewed the recent history and physical documentation. No sedation was used for this procedure. Pre and post activities have been reviewed. I was present for the entire procedure. 1. Angina pectoris. 2. Severe stenosis, mid LMCA. 3. IVUS demonstrated severe, eccentric, fibro-fatty plaque in the mid LMCA. 4. Severe stenosis, proximal ramus. 5. Otherwise, mild nonobstructive CAD. 6. Normal LV systolic function, EF 50-09% SYNTAX score = 16. Diagnostic Procedure Recommendations 1. CTS consult to consider CABG to LAD, LCx, Ramus.  Signatures  Electronically signed by Bishop Limbo, DO, FACC(Diagnostic  Physician) on 04/28/2016 10:44  Angiographic findings  Cardiac Arteries and Lesion Findings LMCA: Abnormal. Mid - 75%   Lesion on LMCA: Mid subsection.75% stenosis 8 mm length . Pre procedure   TIMI III flow was noted. The lesion was diagnosed as High Risk (C). The   lesion was discrete, eccentric and lightly calcified.The lesion showed   evidence of not present thrombus, mild angulation and mild   tortuosity.Bifurcation lesion.   Pre PCI IVUS:   The Plaque   Composition is   Fibro-fatty. LAD: Normal appearance with 0% stenosis. LCx: Abnormal. Proximal - 20% Mid - 30% Distal - 40% OM1 - 0% RCA: Abnormal. Mid - 30% Ramus: Abnormal. Proximal - 90% Procedure Data Procedure Date Date: 04/28/2016 Start: 09:33 Contrast Material   - Omnipaque64 ml Fluoroscopy Time: Diagnostic: 5:07 minutes. PCI: 0:00 minutes. Total: 5:07 minutes. Admission Data Admission Date: 04/28/2016  Coronary Tree  Dominance: Right VA LV function assessed  FG:HWEXHB. Ejection Fraction   - Method: Estimated. EF%: 55.  LVA Segment Contractility  1 - Normal       3 - Mild        5 - Severe      7 - Dyskinesis  hypokinesis     hypokinesis  2 - Hypokinesis  4 - Moderate    6 - Akinesis    8 - Aneurysm  hypokinesis  Hemodynamics  Condition: Rest  Heart Rate: 66 bpm Pressures +-----+------------+ !Site !Pressure    ! +-----+------------+ !LV   !200/5 ,19   ! +-----+------------+ !AO   !190/86 (134)! +-----+------------+ !AO   !198/79 (128)! +-----+------------+   Echo 2017 Findings Mitral Valve Normal mitral valve structure and mobility. No significant regurgitation. Aortic Valve Normal tricuspid aortic valve with pliable leaflets, no stenosis or insufficiency. Tricuspid Valve Structurally normal tricuspid valve with no stenosis. Pulmonic Valve Normal pulmonic valve structure and mobility. Left Atrium Normal left atrium. Left Ventricle Normal left ventricle size and systolic function, with no segmental abnormality. Mild concentric left ventricular hypertrophy Right Atrium Normal right atrium. Right Ventricle Normal right ventricle structure and function. Pericardial Effusion No evidence of pericardial effusion. Pleural Effusion No evidence of pleural effusion. Miscellaneous The aorta is within normal limits. The Pulmonary artery is within normal limits. Intact interatrial septum with no obvious shunt by color doppler. IVC is normal and collapses M-Mode/2D Measurements & Calculations EF Estimated: 65 % [address] [place], [place] [address] [phone]  US Carotid 2015 IMPRESSION: Less than 50% stenosis in the right and left internal carotid arteries. Tortuosity is noted bilaterally resulting in somewhat elevated peak systolic velocity measurements. Tachycardia is noted.   Laboratory Data:  High Sensitivity Troponin:   Recent Labs  Lab 03/22/20 1419 03/22/20 1703  TROPONINIHS 31* 42*     Cardiac  EnzymesNo results for input(s): TROPONINI in the last 168 hours. No results for input(s): TROPIPOC in the last 168 hours.  Chemistry Recent Labs  Lab 03/22/20 1345 03/23/20 0527 03/24/20 0454  NA 136 138 132*  K 2.9* 3.3* 3.7  CL 97* 96* 86*  CO2 24 28 29   GLUCOSE 248* 196* 285*  BUN 10 8 19   CREATININE 0.86 0.97 1.15*  CALCIUM 9.5 9.0 9.4  GFRNONAA >60 >60 51*  GFRAA >60 >60 59*  ANIONGAP 15 14 17*    Recent Labs  Lab 03/22/20 1345  PROT 8.0  ALBUMIN 4.3  AST 40  ALT 18  ALKPHOS 121  BILITOT 0.6   Hematology Recent Labs  Lab 03/22/20 1345 03/23/20 0527 03/24/20 0454  WBC 29.5* 29.0* 25.1*  RBC 4.87 4.68 5.37*  HGB 13.7 13.3 15.3*  HCT 41.3 40.3 46.0  MCV 84.8 86.1 85.7  MCH 28.1 28.4 28.5  MCHC 33.2 33.0 33.3  RDW 13.2 13.3 13.3  PLT 525* 457* 571*   BNP Recent Labs  Lab 03/22/20 1419  BNP 255.4*    DDimer No results for input(s): DDIMER in the last 168 hours.   Radiology/Studies:  CT CHEST W CONTRAST  Result Date: 03/22/2020 CLINICAL DATA:  Cough, shortness of breath, chest tightness and congestion. EXAM: CT CHEST, ABDOMEN, AND PELVIS WITH CONTRAST TECHNIQUE: Multidetector CT imaging of the chest, abdomen and pelvis was performed following the standard protocol during bolus administration of intravenous contrast. CONTRAST:  100mL OMNIPAQUE IOHEXOL 300 MG/ML  SOLN COMPARISON:  December 11, 2016 FINDINGS: CT CHEST FINDINGS Cardiovascular: There is marked severity calcification of the aortic arch. Normal heart size. No pericardial effusion. Mediastinum/Nodes: No enlarged mediastinal, hilar, or axillary lymph nodes. Thyroid gland, trachea, and esophagus demonstrate no significant findings. Lungs/Pleura: Lungs are clear. No pleural effusion or pneumothorax. Musculoskeletal: Multiple sternal wires are seen. Degenerative changes are seen within the thoracic spine. CT ABDOMEN PELVIS FINDINGS Hepatobiliary: A stable 7 mm focus of parenchymal low attenuation is seen  within the anteromedial aspect of the right lobe of the liver (axial CT image 46, CT series number 507). No gallstones, gallbladder wall thickening, or biliary dilatation. Pancreas: Unremarkable. No pancreatic ductal dilatation or surrounding inflammatory changes. Spleen: Normal in size without focal abnormality. Adrenals/Urinary Tract: Adrenal glands are unremarkable. Kidneys are normal, without renal calculi, focal lesion, or hydronephrosis. Bladder is unremarkable. Stomach/Bowel: There is a small to moderate sized hiatal hernia. Appendix appears normal. No evidence of bowel wall thickening, distention, or inflammatory changes. Noninflamed diverticula are seen throughout the large bowel. Vascular/Lymphatic: There is marked severity calcification of the abdominal aorta. No enlarged abdominal or pelvic lymph nodes. Reproductive: Status post hysterectomy. No adnexal masses. Other: No abdominal wall hernia or abnormality. No abdominopelvic ascites. Musculoskeletal: There is grade 1 anterolisthesis of the L4 vertebral body on L5. Mild to moderate severity multilevel degenerative changes are seen throughout the lumbar spine, most prominent at the level of L1-L2. IMPRESSION: 1. Noninflamed diverticula throughout the large bowel. 2. Small to moderate sized hiatal hernia. 3. Stable 7 mm focus of parenchymal low attenuation within the anteromedial aspect of the right lobe of the liver. This may represent a small cyst or hemangioma. 4. Grade 1 anterolisthesis of the L4 vertebral body on L5. 5. Mild to moderate severity multilevel degenerative changes throughout the lumbar spine, most prominent at the level of L1-L2. 6. Aortic atherosclerosis. Aortic Atherosclerosis (ICD10-I70.0). Electronically Signed   By: Aram Candelahaddeus  Houston M.D.   On: 03/22/2020 17:58   CT  ABDOMEN PELVIS W CONTRAST  Result Date: 03/22/2020 CLINICAL DATA:  Cough, shortness of breath, chest tightness and congestion. EXAM: CT CHEST, ABDOMEN, AND PELVIS WITH  CONTRAST TECHNIQUE: Multidetector CT imaging of the chest, abdomen and pelvis was performed following the standard protocol during bolus administration of intravenous contrast. CONTRAST:  OMNIPAQUE IOHEXOL 300 MG/ML  SOLN COMPARISON:  Feb 21, 2020 FINDINGS: CT CHEST FINDINGS Cardiovascular: There is marked severity calcification of the aortic arch. Normal heart size. No pericardial effusion. Mediastinum/Nodes: No enlarged mediastinal, hilar, or axillary lymph nodes. Thyroid gland, trachea, and esophagus demonstrate no significant findings. Lungs/Pleura: Lungs are clear. No pleural effusion or pneumothorax. Musculoskeletal: Multiple sternal wires are seen. Degenerative changes are seen within the thoracic spine. CT ABDOMEN PELVIS FINDINGS Hepatobiliary: A stable 7 mm focus of parenchymal low attenuation is seen within the anteromedial aspect of the right lobe of the liver (axial CT image 46, CT series number 507). No gallstones, gallbladder wall thickening, or biliary dilatation. Pancreas: Unremarkable. No pancreatic ductal dilatation or surrounding inflammatory changes. Spleen: Normal in size without focal abnormality. Adrenals/Urinary Tract: Adrenal glands are unremarkable. Kidneys are normal, without renal calculi, focal lesion, or hydronephrosis. Bladder is unremarkable. Stomach/Bowel: There is a small to moderate sized hiatal hernia. Appendix appears normal. No evidence of bowel wall thickening, distention, or inflammatory changes. Noninflamed diverticula are seen throughout the large bowel. Vascular/Lymphatic: There is marked severity calcification of the abdominal aorta. No enlarged abdominal or pelvic lymph nodes. Reproductive: Status post hysterectomy. No adnexal masses. Other: No abdominal wall hernia or abnormality. No abdominopelvic ascites. Musculoskeletal: There is grade 1 anterolisthesis of the L4 vertebral body on L5. Mild to moderate severity multilevel degenerative changes are seen throughout  the lumbar spine, most prominent at the level of L1-L2. IMPRESSION: 1. Noninflamed diverticula throughout the large bowel. 2. Small to moderate sized hiatal hernia. 3. Stable 7 mm focus of parenchymal low attenuation within the anteromedial aspect of the right lobe of the liver. This may represent a small cyst or hemangioma. 4. Grade 1 anterolisthesis of the L4 vertebral body on L5. 5. Mild to moderate severity multilevel degenerative changes throughout the lumbar spine, most prominent at the level of L1-L2. 6. Aortic atherosclerosis. Aortic Atherosclerosis (ICD10-I70.0). Electronically Signed   By: Aram Candela M.D.   On: 03/22/2020 17:57   DG Chest Portable 1 View  Result Date: 03/22/2020 CLINICAL DATA:  Allergic reaction.  History of asthma. EXAM: PORTABLE CHEST 1 VIEW COMPARISON:  Chest radiograph 03/22/2020. FINDINGS: Monitoring leads overlie the patient. Stable cardiac and mediastinal contours. No consolidative pulmonary opacities. No pleural effusion or pneumothorax. Thoracic spine degenerative changes. IMPRESSION: No acute cardiopulmonary process. Electronically Signed   By: Annia Belt M.D.   On: 03/22/2020 16:52   DG Chest Portable 1 View  Result Date: 03/22/2020 CLINICAL DATA:  Elevated white blood cell count, hypoxia, sepsis EXAM: PORTABLE CHEST 1 VIEW COMPARISON:  02/21/2020 FINDINGS: Frontal view of the chest demonstrates stable postsurgical changes from CABG. The cardiac silhouette is unremarkable. No airspace disease, effusion, or pneumothorax. No acute bony abnormality. IMPRESSION: 1. No acute intrathoracic process. Electronically Signed   By: Sharlet Salina M.D.   On: 03/22/2020 15:19    Assessment and Plan:   CAD s/p CABG (2017) --No current CP. Reports intermittent CP over the last week.  HS troponin minimally elevated and flat trending. EKG without acute ST/T changes. Presentation not consistent with ACS. No further ischemic work-up at this time. BNP 255.4 and euvolemic on exam.  Echo  ordered by IM and pending result with previous echo as above with EF nl. Continue ASA.  Intolerance reported to beta-blockers, statins, and Repatha. PTA medications did not include SL nitro, which could be added to her medications if tolerated and for CP relief. Could also consider addition of Imdur or Ranexa for additional antianginal effect. Continue amlodipine, given she is tolerating this medication well. Could consider future transition to long acting diltiazem for HR control given tolerating amlodipine at this time and if EF nl on updated echo. Schedule follow-up in the office at time of discharge. With recurrent chest pain, and once she is recovered from her current infection, could consider further work-up of her reported chest pain with outpatient stress test. Will defer for now.  Hypokalemia --K 2.9. Replete with goal 4.0. Mg 1.7  2.9 with goal 2.0. Consider that electrolyte abnormalities increase patient risk for arrhythmia.  Essential HTN H/o RAS --Tolerating amlodipine well.  Reports intolerance to beta-blockers and ACEi, and she prefers not to be on clonidine given the rebound hypertension. Could consider hydralazine or chlorthalidone, as well as transition from clonidine to diltiazem if EF nl. Consider repeat RAS scan as outpatient if BP remains difficult to control. On review of EMR, she had a 2018 scan with 70% RAS but non-flow limiting (unable to get report on EMR).   Sinus tachycardia --Caution with albuterol, given it is likely to raise HR. Reports intolerance to beta-blockers.  As above, could consider transition from amlodipine to diltiazem if EF nl on echo today and for HR and BP control.   HLD --Reports intolerance to statins and Repatha in the past. Total cholesterol 230 with HDL 39.3 and direct LDL 173.8. Goal LDL below 70. Given her intolerance to statins in the past, consider retrial, and if she is unable to tolerate statins referral to lipid clinic/pharmacy.  COPD and  asthma exacerbation --Per pulmonology. Likely contributing to her sx as outlined above. Caution with albuterol, given it is likely to raise HR.  Medication intolerances --Intolerance reported to beta-blockers, ACEi, statins, and Repatha. Prefers to avoid clonidine. See above. Willing to consider retrial of medications on discussion with her today.  Recent tobacco use  --- Quit 2 weeks ago.  Previously smoking 1/4 pack/day. Ongoing cessation encouraged.  Sepsis --Per IM.  For questions or updates, please contact [facility] Please consult [email or web address] for contact info under     Signed, Lennon Alstrom, PA-C  03/24/2020 3:01 PM

## 2020-03-24 NOTE — Progress Notes (Signed)
HR is not an acute change. MD is aware of HR. No new orders. RN will continue to assess and VS will remain per unit policy of every 4 hours.

## 2020-03-24 NOTE — Progress Notes (Signed)
PROGRESS NOTE    [name]   WGN:[id number]  DOB: 13-Apr-1956  PCP: Patient, No Pcp Per    DOA: 03/22/2020 LOS: 2   Brief Narrative   [name]  is a 64 y.o. Caucasian female with a history of asthma and COPD, hypertension, CAD s/p four-vessel CABG and migraine, who presented to the ED on 03/22/20 with chest tightness with mild dyspnea and wheezing.  Reported associated low grade fevers and tachycardia at rest with occasional paroxysmal nocturnal dyspnea.  In the ED, temp initially 33F >> 100F, BP uncontrolled at 206/102, HR 105 to 120's.  Labs notable for K 2.9, glucose 248, BNP 255.4, troponin 31 >> 42, initial lactate 4.3 >> 2.1.  CBC with leukocytosis of 29.5k with neutrophilia and thrombocytosis.  UA showed glucosuria, no infection.  COVID-19 PCR negative.  (Not yet vaccinated).  CXR negative for acute findings.  CT's chest/abdomen/pelvis showed severe calcification of the aortic arch with no other acute chest abnormalities, small to moderate sized hiatal hernia and noninflamed diverticula throughout the large bowel and hepatic small cyst or hemangioma, grade 1 anterolisthesis of L4 on L5, aortic atherosclerosis and mild to moderate severity multilevel degenerative changes throughout the lumbar spine most prominent at L1-L2.  Patient was treated with Cefepime in the ED, subsequently developed anaphylactic reaction for which IM epinephrine, IV Benadryl, IV Pepcid and 125 mg Solu-Medrol were given.  Small LR bolus and IV Vancomycin, and albuterol neb was given.  Patient admitted to [facility] on hospitalist service for further evaluation and management.       Assessment & Plan   Principal Problem:   Asthma exacerbation Active Problems:   Hyperlipidemia   Migraine   PERIPHERAL VASCULAR DISEASE   DEGENERATIVE DISC DISEASE, LUMBAR SPINE   Essential hypertension   Coronary artery disease of native artery of native heart with stable angina pectoris (HCC)   Hx of CABG   Anaphylactic  reaction   Hypertensive urgency   Sepsis secondary to acute bronchitis with secondary Asthma and COPD acute exacerbation Patient presented with fever, leukocytosis and lactic acidosis.  Has not been hypoxic. --Xopenex nebs q6h prn (due to tachycardia) --Atrovent MDI q6h --will stop steroids as she is not wheezing, she reports anxiety which can be due to steroids. --continue Levaquin for total 5 days --hold home Stiolto respimat --pulmonology input appreciated  Tachycardia - POA, persistent today.  Suspect due to steroids, ?paradoxical reaction to Benadryl.  She reports allergy to beta blockers.  Has diltiazem on med history, it was ordered by patient refused, stating it raises her BP in minutes.  On norvasc currently --Telemetry  --cardio c/s --stop steroids on 6/7 - Nursing reported 7 beats of v.tach around 1755 - cardio made aware  Elevated BNP - POA with BNP 255.3.  Suspect diastolic CHF vs early cor pulmonale, possibly due to HTN urgency.  No known history of CHF.  History provided indicates possible PND/orthopnea. --Echo pending --IV Lasix --monitor I/O's & daily weights  Hypertensive urgency - POA.  Improved. Essential Hypertension - continue home amlodipine, PRN hydralazine.  Anaphylaxis secondary to cefepime/cephalosporins - occurred in the ED, resolved with treatment outlined above.  Continue IV steroids for asthma/COPD.  IV Pepcid and Benadryl were continued today, d/c this evening if stable and no further symptoms.  Hypokalemia - replaced.  Monitor and replace as needed.  Type 2 diabetes mellitus, uncontrolled - A1c 7.9%. --sliding scale NovoLog and CBG's AC/HS. Off steroids - should improve sugars.  Tobacco abuse - nicotine patch  available.  Smoking cessation counseling.  PVD - continue ASA.  Overweight, near obesity: Body mass index is 25.65 kg/m.  Complicates overall care and prognosis.  DVT prophylaxis: Lovenox  Diet:  Diet Orders (From admission,  onward)    Start     Ordered   03/22/20 2103  Diet Carb Modified Fluid consistency: Thin; Room service appropriate? Yes  Diet effective now    Comments: HEART HEALTHY LOW SALT  Question Answer Comment  Diet-HS Snack? Nothing   Calorie Level Medium 1600-2000   Fluid consistency: Thin   Room service appropriate? Yes      03/22/20 2102            Code Status: Full Code    Subjective 03/24/20    Would like to establish local cardiology. Talks at length about adverse reactions to many medications.questions around need for insulin at D/C Disposition Plan & Communication   Status is: Inpatient  Inpatient status remains appropriate due to severity of illness and need for IV therapies at this time.  Dispo: The patient is from: Home              Anticipated d/c is to: Home              Anticipated d/c date is: 1-2 days              Patient currently is not medically stable for discharge. Await cardio eval, had v tech (7 beats) this afternoon    Family Communication: none at bedside - d/w patient   Consults, Procedures, Significant Events   Consultants:   Pulmonology  Cardiology   Antimicrobials:   Levaquin 6/5 >>  Significant Events: Anaphylactic reaction to Cefepime in ED on 03/22/20  Objective   Vitals:   03/24/20 0723 03/24/20 0824 03/24/20 1215 03/24/20 1627  BP: (!) 163/87  (!) 151/75 (!) 148/70  Pulse: (!) 122 (!) 114 (!) 102 (!) 111  Resp: Temp: 98.3 F (36.8 C)  98.5 F (36.9 C) 98.6 F (37 C)  TempSrc: Oral  Oral Oral  SpO2: 93% 95% 95% 93%  Weight:      Height:        Intake/Output Summary (Last 24 hours) at 03/24/2020 1757 Last data filed at 03/24/2020 1626 Gross per 24 hour  Intake 560 ml  Output 3150 ml  Net -2590 ml   Filed Weights   03/22/20 2029 03/23/20 0500 03/24/20 0347  Weight: 68.9 kg 68.9 kg 65.7 kg    Physical Exam:  General exam: awake, alert, no acute distress HEENT: no oropharyngeal swelling, moist mucus  membranes, hearing grossly normal  Respiratory system: decreased breath sounds with expiratory wheezes worse on right than left, no rhonchi, normal respiratory effort. Cardiovascular system: normal S1/S2, tachycardic, regular rhythm, no pedal edema.   Gastrointestinal system: soft, NT, ND, no HSM felt, +bowel sounds. Central nervous system: A&O x4 no gross focal neurologic deficits, normal speech Extremities: moves all, no edema, normal tone Skin: dry, intact, normal temperature, normal color Psychiatry: anxious mood, congruent affect, extremely talkative with tangential thought content  Labs   Data Reviewed: I have personally reviewed following labs and imaging studies  CBC: Recent Labs  Lab 03/22/20 1345 03/23/20 0527 03/24/20 0454  WBC 29.5* 29.0* 25.1*  NEUTROABS 25.5* 26.6* 22.3*  HGB 13.7 13.3 15.3*  HCT 41.3 40.3 46.0  MCV 84.8 86.1 85.7  PLT 525* 457* 571*   Basic Metabolic Panel: Recent Labs  Lab 03/22/20 1345 03/23/20  2440 03/24/20 0454  NA 136 138 132*  K 2.9* 3.3* 3.7  CL 97* 96* 86*  CO2 24 28 29   GLUCOSE 248* 196* 285*  BUN 10 8 19   CREATININE 0.86 0.97 1.15*  CALCIUM 9.5 9.0 9.4  MG  --  1.7 2.9*   GFR: Estimated Creatinine Clearance: 46.2 mL/min (A) (by C-G formula based on SCr of 1.15 mg/dL (H)). Liver Function Tests: Recent Labs  Lab 03/22/20 1345  AST 40  ALT 18  ALKPHOS 121  BILITOT 0.6  PROT 8.0  ALBUMIN 4.3   No results for input(s): LIPASE, AMYLASE in the last 168 hours. No results for input(s): AMMONIA in the last 168 hours. Coagulation Profile: Recent Labs  Lab 03/22/20 1345  INR 1.0   Cardiac Enzymes: No results for input(s): CKTOTAL, CKMB, CKMBINDEX, TROPONINI in the last 168 hours. BNP (last 3 results) No results for input(s): PROBNP in the last 8760 hours. HbA1C: Recent Labs    03/23/20 0527  HGBA1C 7.9*   CBG: Recent Labs  Lab 03/23/20 1212 03/23/20 1645 03/24/20 0725 03/24/20 1215 03/24/20 1656  GLUCAP 291*  259* 267* 256* 235*   Lipid Profile: No results for input(s): CHOL, HDL, LDLCALC, TRIG, CHOLHDL, LDLDIRECT in the last 72 hours. Thyroid Function Tests: No results for input(s): TSH, T4TOTAL, FREET4, T3FREE, THYROIDAB in the last 72 hours. Anemia Panel: No results for input(s): VITAMINB12, FOLATE, FERRITIN, TIBC, IRON, RETICCTPCT in the last 72 hours. Sepsis Labs: Recent Labs  Lab 03/22/20 1345 03/22/20 1703 03/23/20 0527 03/23/20 0907 03/24/20 0454  PROCALCITON  --   --  0.18  --  0.18  LATICACIDVEN 4.3* 2.1* 3.4* 2.6*  --     Recent Results (from the past 240 hour(s))  Culture, blood (Routine x 2)     Status: None (Preliminary result)   Collection Time: 03/22/20  1:45 PM   Specimen: BLOOD  Result Value Ref Range Status   Specimen Description BLOOD LEFT FOREARM  Final   Special Requests   Final    BOTTLES DRAWN AEROBIC AND ANAEROBIC Blood Culture adequate volume   Culture   Final    NO GROWTH 2 DAYS Performed at [facility], [address] [place], [address] [place]    Report Status PENDING  Incomplete  Culture, blood (Routine x 2)     Status: None (Preliminary result)   Collection Time: 03/22/20  2:19 PM   Specimen: BLOOD  Result Value Ref Range Status   Specimen Description BLOOD BLOOD LEFT FOREARM  Final   Special Requests   Final    BOTTLES DRAWN AEROBIC AND ANAEROBIC Blood Culture results may not be optimal due to an excessive volume of blood received in culture bottles   Culture   Final    NO GROWTH 2 DAYS Performed at [facility], [address] [place], [address] [place]    Report Status PENDING  Incomplete  SARS Coronavirus 2 by RT PCR (hospital order, performed in [facility] hospital lab) Nasopharyngeal Nasopharyngeal Swab     Status: None   Collection Time: 03/22/20  2:19 PM   Specimen: Nasopharyngeal Swab  Result Value Ref Range Status   SARS Coronavirus 2 NEGATIVE NEGATIVE Final    Comment: (NOTE) SARS-CoV-2 target nucleic  acids are NOT DETECTED. The SARS-CoV-2 RNA is generally detectable in upper and lower respiratory specimens during the acute phase of infection. The lowest concentration of SARS-CoV-2 viral copies this assay can detect is 250 copies / mL. A negative result  does not preclude SARS-CoV-2 infection and should not be used as the sole basis for treatment or other patient management decisions.  A negative result may occur with improper specimen collection / handling, submission of specimen other than nasopharyngeal swab, presence of viral mutation(s) within the areas targeted by this assay, and inadequate number of viral copies (<250 copies / mL). A negative result must be combined with clinical observations, patient history, and epidemiological information. Fact Sheet for Patients:   BoilerBrush.com.cyhttps://www.fda.gov/media/136312/download Fact Sheet for Healthcare Providers: https://pope.com/https://www.fda.gov/media/136313/download This test is not yet approved or cleared  by the [place] FDA and has been authorized for detection and/or diagnosis of SARS-CoV-2 by FDA under an Emergency Use Authorization (EUA).  This EUA will remain in effect (meaning this test can be used) for the duration of the COVID-19 declaration under Section 564(b)(1) of the Act, 21 U.S.C. section 360bbb-3(b)(1), unless the authorization is terminated or revoked sooner. Performed at [facility], [address] [place], [place] [address]   Respiratory Panel by PCR     Status: None   Collection Time: 03/23/20  4:37 PM   Specimen: Nasopharyngeal Swab; Respiratory  Result Value Ref Range Status   Adenovirus NOT DETECTED NOT DETECTED Final   Coronavirus 229E NOT DETECTED NOT DETECTED Final    Comment: (NOTE) The Coronavirus on the Respiratory Panel, DOES NOT test for the novel  Coronavirus (2019 nCoV)    Coronavirus HKU1 NOT DETECTED NOT DETECTED Final   Coronavirus NL63 NOT DETECTED NOT DETECTED Final   Coronavirus OC43 NOT  DETECTED NOT DETECTED Final   Metapneumovirus NOT DETECTED NOT DETECTED Final   Rhinovirus / Enterovirus NOT DETECTED NOT DETECTED Final   Influenza A NOT DETECTED NOT DETECTED Final   Influenza B NOT DETECTED NOT DETECTED Final   Parainfluenza Virus 1 NOT DETECTED NOT DETECTED Final   Parainfluenza Virus 2 NOT DETECTED NOT DETECTED Final   Parainfluenza Virus 3 NOT DETECTED NOT DETECTED Final   Parainfluenza Virus 4 NOT DETECTED NOT DETECTED Final   Respiratory Syncytial Virus NOT DETECTED NOT DETECTED Final   Bordetella pertussis NOT DETECTED NOT DETECTED Final   Chlamydophila pneumoniae NOT DETECTED NOT DETECTED Final   Mycoplasma pneumoniae NOT DETECTED NOT DETECTED Final    Comment: Performed at [facility], [address] [place], [place] [address]  MRSA PCR Screening     Status: None   Collection Time: 03/23/20  4:37 PM  Result Value Ref Range Status   MRSA by PCR NEGATIVE NEGATIVE Final    Comment:        The GeneXpert MRSA Assay (FDA approved for NASAL specimens only), is one component of a comprehensive MRSA colonization surveillance program. It is not intended to diagnose MRSA infection nor to guide or monitor treatment for MRSA infections. Performed at [facility], [address] [place], [place] [address]       Imaging Studies   ECHOCARDIOGRAM COMPLETE  Result Date: 03/24/2020    ECHOCARDIOGRAM REPORT   Patient Name:   [name] Date of Exam: 03/24/2020 Medical Rec #:  [id number]         Height:       63.0 in Accession #:    [id number]        Weight:       144.8 lb Date of Birth:  1956/04/09         BSA:          1.686 m Patient Age:    [age]  years          BP:           163/87 mmHg Patient Gender: F                 HR:           114 bpm. Exam Location:  [facility] Procedure: 2D Echo, Cardiac Doppler and Color Doppler Indications:     Dyspnea 786.09  History:         Patient has no prior history of Echocardiogram examinations.                  Prior  CABG, COPD; Risk Factors:Diabetes. Migraine.  Sonographer:     Sherrie Sport RDCS (AE) Referring Phys:  [id number] Arvella Merles MANSY Diagnosing Phys: Kathlyn Sacramento MD IMPRESSIONS  1. Left ventricular ejection fraction, by estimation, is 55 to 60%. The left ventricle has normal function. The left ventricle has no regional wall motion abnormalities. There is moderate left ventricular hypertrophy. Left ventricular diastolic parameters are consistent with Grade I diastolic dysfunction (impaired relaxation).  2. Right ventricular systolic function is normal. The right ventricular size is normal. Tricuspid regurgitation signal is inadequate for assessing PA pressure.  3. The mitral valve is normal in structure. No evidence of mitral valve regurgitation. No evidence of mitral stenosis.  4. The aortic valve is normal in structure. Aortic valve regurgitation is not visualized. No aortic stenosis is present.  5. The inferior vena cava is normal in size with greater than 50% respiratory variability, suggesting right atrial pressure of 3 mmHg. FINDINGS  Left Ventricle: Left ventricular ejection fraction, by estimation, is 55 to 60%. The left ventricle has normal function. The left ventricle has no regional wall motion abnormalities. The left ventricular internal cavity size was normal in size. There is  moderate left ventricular hypertrophy. Left ventricular diastolic parameters are consistent with Grade I diastolic dysfunction (impaired relaxation). Right Ventricle: The right ventricular size is normal. No increase in right ventricular wall thickness. Right ventricular systolic function is normal. Tricuspid regurgitation signal is inadequate for assessing PA pressure. The tricuspid regurgitant velocity is 1.75 m/s, and with an assumed right atrial pressure of 10 mmHg, the estimated right ventricular systolic pressure is 22.9 mmHg. Left Atrium: Left atrial size was normal in size. Right Atrium: Right atrial size was normal in size.  Pericardium: There is no evidence of pericardial effusion. Mitral Valve: The mitral valve is normal in structure. Normal mobility of the mitral valve leaflets. No evidence of mitral valve regurgitation. No evidence of mitral valve stenosis. Tricuspid Valve: The tricuspid valve is normal in structure. Tricuspid valve regurgitation is not demonstrated. No evidence of tricuspid stenosis. Aortic Valve: The aortic valve is normal in structure. Aortic valve regurgitation is not visualized. No aortic stenosis is present. Aortic valve mean gradient measures 3.0 mmHg. Aortic valve peak gradient measures 6.2 mmHg. Aortic valve area, by VTI measures 3.57 cm. Pulmonic Valve: The pulmonic valve was normal in structure. Pulmonic valve regurgitation is not visualized. No evidence of pulmonic stenosis. Aorta: The aortic root is normal in size and structure. Venous: The inferior vena cava is normal in size with greater than 50% respiratory variability, suggesting right atrial pressure of 3 mmHg. IAS/Shunts: No atrial level shunt detected by color flow Doppler.  LEFT VENTRICLE PLAX 2D LVIDd:         3.14 cm  Diastology LVIDs:         2.10 cm  LV e' lateral:  7.83 cm/s LV PW:         1.48 cm  LV E/e' lateral: 10.8 LV IVS:        1.54 cm  LV e' medial:    5.11 cm/s LVOT diam:     2.00 cm  LV E/e' medial:  16.6 LV SV:         64 LV SV Index:   38 LVOT Area:     3.14 cm  RIGHT VENTRICLE RV Basal diam:  2.57 cm RV S prime:     10.90 cm/s TAPSE (M-mode): 3.0 cm LEFT ATRIUM             Index       RIGHT ATRIUM           Index LA diam:        3.70 cm 2.20 cm/m  RA Area:     14.60 cm LA Vol (A2C):   27.9 ml 16.55 ml/m RA Volume:   34.40 ml  20.41 ml/m LA Vol (A4C):   24.1 ml 14.30 ml/m LA Biplane Vol: 28.7 ml 17.03 ml/m  AORTIC VALVE AV Area (Vmax):    2.89 cm AV Area (Vmean):   3.03 cm AV Area (VTI):     3.57 cm AV Vmax:           125.00 cm/s AV Vmean:          77.100 cm/s AV VTI:            0.178 m AV Peak Grad:      6.2 mmHg AV  Mean Grad:      3.0 mmHg LVOT Vmax:         115.00 cm/s LVOT Vmean:        74.400 cm/s LVOT VTI:          0.203 m LVOT/AV VTI ratio: 1.14  AORTA Ao Root diam: 2.80 cm MITRAL VALVE                TRICUSPID VALVE MV Area (PHT): 5.38 cm     TR Peak grad:   12.2 mmHg MV Decel Time: 141 msec     TR Vmax:        175.00 cm/s MV E velocity: 84.90 cm/s MV A velocity: 121.00 cm/s  SHUNTS MV E/A ratio:  0.70         Systemic VTI:  0.20 m                             Systemic Diam: 2.00 cm Lorine Bears MD Electronically signed by Lorine Bears MD Signature Date/Time: 03/24/2020/4:45:32 PM    Final      Medications   Scheduled Meds: . amLODipine  5 mg Oral Daily  . aspirin  81 mg Oral Daily  . clonazePAM  0.5 mg Oral BID  . enoxaparin (LOVENOX) injection  40 mg Subcutaneous Q24H  . famotidine  20 mg Oral Daily  . furosemide  40 mg Intravenous BID  . guaiFENesin  600 mg Oral BID  . insulin aspart  0-15 Units Subcutaneous TID PC & HS  . ipratropium  2.5 mL Inhalation Q6H  . pantoprazole  40 mg Oral Daily  . sodium chloride flush  3 mL Intravenous Q12H   Continuous Infusions: . sodium chloride    . [START ON 03/25/2020] levofloxacin (LEVAQUIN) IV         LOS: 2 days    Time spent: 69  minutes with > 50% spent in coordination of care and direct patient contact.    Kalle Bernath Sherryll Burger, DO [facility]  03/24/2020, 5:57 PM    If 7PM-7AM, please contact night-coverage. How to contact the [facility] Attending or Consulting provider 7A - 7P or covering provider during after hours 7P -7A, for this patient?    1. Check the care team in [facility] and look for a) attending/consulting [facility] provider listed and b) the [facility] team listed 2. Log into [email or web address] and use [facility] universal password to access. If you do not have the password, please contact the hospital operator. 3. Locate the [facility] provider you are looking for under [facility] and page to a number that you can be directly reached. 4. If you still  have difficulty reaching the provider, please page the [facility] (Director on Call) for the Hospitalists listed on amion for assistance.

## 2020-03-24 NOTE — Progress Notes (Signed)
*  PRELIMINARY RESULTS* Echocardiogram 2D Echocardiogram has been performed.  Cristela Blue 03/24/2020, 10:50 AM

## 2020-03-25 ENCOUNTER — Inpatient Hospital Stay: Payer: 59

## 2020-03-25 ENCOUNTER — Encounter: Payer: Self-pay | Admitting: Family Medicine

## 2020-03-25 DIAGNOSIS — G43909 Migraine, unspecified, not intractable, without status migrainosus: Secondary | ICD-10-CM

## 2020-03-25 DIAGNOSIS — I472 Ventricular tachycardia: Secondary | ICD-10-CM

## 2020-03-25 DIAGNOSIS — R600 Localized edema: Secondary | ICD-10-CM

## 2020-03-25 DIAGNOSIS — I2510 Atherosclerotic heart disease of native coronary artery without angina pectoris: Secondary | ICD-10-CM

## 2020-03-25 DIAGNOSIS — J449 Chronic obstructive pulmonary disease, unspecified: Secondary | ICD-10-CM

## 2020-03-25 DIAGNOSIS — R000 Tachycardia, unspecified: Secondary | ICD-10-CM

## 2020-03-25 DIAGNOSIS — D72829 Elevated white blood cell count, unspecified: Secondary | ICD-10-CM

## 2020-03-25 LAB — CBC WITH DIFFERENTIAL/PLATELET
Abs Immature Granulocytes: 0.23 10*3/uL — ABNORMAL HIGH (ref 0.00–0.07)
Basophils Absolute: 0.1 10*3/uL (ref 0.0–0.1)
Basophils Relative: 0 %
Eosinophils Absolute: 0 10*3/uL (ref 0.0–0.5)
Eosinophils Relative: 0 %
HCT: 46.5 % — ABNORMAL HIGH (ref 36.0–46.0)
Hemoglobin: 15.6 g/dL — ABNORMAL HIGH (ref 12.0–15.0)
Immature Granulocytes: 1 %
Lymphocytes Relative: 15 %
Lymphs Abs: 3.8 10*3/uL (ref 0.7–4.0)
MCH: 28.6 pg (ref 26.0–34.0)
MCHC: 33.5 g/dL (ref 30.0–36.0)
MCV: 85.2 fL (ref 80.0–100.0)
Monocytes Absolute: 2.4 10*3/uL — ABNORMAL HIGH (ref 0.1–1.0)
Monocytes Relative: 9 %
Neutro Abs: 19.6 10*3/uL — ABNORMAL HIGH (ref 1.7–7.7)
Neutrophils Relative %: 75 %
Platelets: 505 10*3/uL — ABNORMAL HIGH (ref 150–400)
RBC: 5.46 MIL/uL — ABNORMAL HIGH (ref 3.87–5.11)
RDW: 13.2 % (ref 11.5–15.5)
Smear Review: NORMAL
WBC: 26.1 10*3/uL — ABNORMAL HIGH (ref 4.0–10.5)
nRBC: 0 % (ref 0.0–0.2)

## 2020-03-25 LAB — BASIC METABOLIC PANEL
Anion gap: 14 (ref 5–15)
BUN: 25 mg/dL — ABNORMAL HIGH (ref 8–23)
CO2: 33 mmol/L — ABNORMAL HIGH (ref 22–32)
Calcium: 9.1 mg/dL (ref 8.9–10.3)
Chloride: 89 mmol/L — ABNORMAL LOW (ref 98–111)
Creatinine, Ser: 1.07 mg/dL — ABNORMAL HIGH (ref 0.44–1.00)
GFR calc Af Amer: >60 mL/min (ref >60)
GFR calc non Af Amer: 56 mL/min — ABNORMAL LOW (ref >60)
Glucose, Bld: 131 mg/dL — ABNORMAL HIGH (ref 70–99)
Potassium: 3.6 mmol/L (ref 3.5–5.1)
Sodium: 136 mmol/L (ref 135–145)

## 2020-03-25 LAB — GLUCOSE, CAPILLARY
Glucose-Capillary: 175 mg/dL — ABNORMAL HIGH (ref 70–99)
Glucose-Capillary: 278 mg/dL — ABNORMAL HIGH (ref 70–99)
Glucose-Capillary: 86 mg/dL (ref 70–99)
Glucose-Capillary: 180 mg/dL — ABNORMAL HIGH (ref 70–99)

## 2020-03-25 LAB — PROCALCITONIN: Procalcitonin: 0.1 ng/mL

## 2020-03-25 MED ORDER — FAMOTIDINE 20 MG PO TABS: 20.0000 mg | ORAL_TABLET | Freq: Every day | ORAL | 0 refills | Status: DC

## 2020-03-25 MED ORDER — IOHEXOL 350 MG/ML SOLN
75.0000 mL | Freq: Once | INTRAVENOUS | Status: AC | PRN
  Administered 2020-03-25: 75 mL via INTRAVENOUS

## 2020-03-25 MED ORDER — DILTIAZEM HCL ER 120 MG PO CP24: 120.0000 mg | ORAL_CAPSULE | Freq: Every day | ORAL | 0 refills | Status: DC

## 2020-03-25 MED ORDER — CLONAZEPAM 0.5 MG PO TABS: 0.5000 mg | ORAL_TABLET | Freq: Two times a day (BID) | ORAL | 0 refills | Status: DC | PRN

## 2020-03-25 NOTE — Progress Notes (Signed)
Discharge instructions provided to pt and spouse.  All questions addressed.  Understanding verified through teach back. Cardiac monitoring discontinued and peripheral IV x2 removed.  Upon return to room with pt's home medications, she appeared pale and diaphoretic; holding abdomen and c/o nausea.  Pt stating "I don't think I am ready to go home."  Primary RN, Diane, updated.

## 2020-03-25 NOTE — Plan of Care (Signed)
  Problem: Coping: Goal: Level of anxiety will decrease Outcome: Not Progressing   

## 2020-03-25 NOTE — Progress Notes (Signed)
Inpatient Diabetes Program Recommendations  AACE/ADA: New Consensus Statement on Inpatient Glycemic Control (2015)  Target Ranges:  Prepandial:   less than 140 mg/dL      Peak postprandial:   less than 180 mg/dL (1-2 hours)      Critically ill patients:  140 - 180 mg/dL   Results for [name], [name] (MRN [id number]) as of 03/25/2020 12:15  Ref. Range 03/24/2020 07:25 03/24/2020 12:15 03/24/2020 16:56 03/24/2020 20:57  Glucose-Capillary Latest Ref Range: 70 - 99 mg/dL 024 (H)  8 units NOVOLOG  256 (H)  8 units NOVOLOG  235 (H)  5 units NOVOLOG  206 (H)  5 units NOVOLOG    Results for [name], [name] (MRN [id number]) as of 03/25/2020 12:15  Ref. Range 03/25/2020 07:34 03/25/2020 11:55  Glucose-Capillary Latest Ref Range: 70 - 99 mg/dL 242 (H)  3 units NOVOLOG  278 (H)    Home DM Meds: None listed  Current Orders: Novolog Moderate Correction Scale/ SSI (0-15 units) TID AC + HS     Solumedrol stopped--last dose given yesterday 6/7 at 6:45am  CBG improved this AM, but elevated at 12pm today.    MD- Please consider adding Novolog Meal Coverage if this trend continues:  Novolog 4 units TID with meals  (Please add the following Hold Parameters: Hold if pt eats <50% of meal, Hold if pt NPO)       --Will follow patient during hospitalization--  Ambrose Finland RN, MSN, CDE Diabetes Coordinator Inpatient Glycemic Control Team Team Pager: [phone] (8a-5p)

## 2020-03-25 NOTE — Discharge Instructions (Signed)
Chronic Obstructive Pulmonary Disease Chronic obstructive pulmonary disease (COPD) is a long-term (chronic) lung problem. When you have COPD, it is hard for air to get in and out of your lungs. Usually the condition gets worse over time, and your lungs will never return to normal. There are things you can do to keep yourself as healthy as possible.  Your doctor may treat your condition with: ? Medicines. ? Oxygen. ? Lung surgery.  Your doctor may also recommend: ? Rehabilitation. This includes steps to make your body work better. It may involve a team of specialists. ? Quitting smoking, if you smoke. ? Exercise and changes to your diet. ? Comfort measures (palliative care). Follow these instructions at home: Medicines  Take over-the-counter and prescription medicines only as told by your doctor.  Talk to your doctor before taking any cough or allergy medicines. You may need to avoid medicines that cause your lungs to be dry. Lifestyle  If you smoke, stop. Smoking makes the problem worse. If you need help quitting, ask your doctor.  Avoid being around things that make your breathing worse. This may include smoke, chemicals, and fumes.  Stay active, but remember to rest as well.  Learn and use tips on how to relax.  Make sure you get enough sleep. Most adults need at least 7 hours of sleep every night.  Eat healthy foods. Eat smaller meals more often. Rest before meals. Controlled breathing Learn and use tips on how to control your breathing as told by your doctor. Try:  Breathing in (inhaling) through your nose for 1 second. Then, pucker your lips and breath out (exhale) through your lips for 2 seconds.  Putting one hand on your belly (abdomen). Breathe in slowly through your nose for 1 second. Your hand on your belly should move out. Pucker your lips and breathe out slowly through your lips. Your hand on your belly should move in as you breathe out.  Controlled coughing Learn  and use controlled coughing to clear mucus from your lungs. Follow these steps: 1. Lean your head a little forward. 2. Breathe in deeply. 3. Try to hold your breath for 3 seconds. 4. Keep your mouth slightly open while coughing 2 times. 5. Spit any mucus out into a tissue. 6. Rest and do the steps again 1 or 2 times as needed. General instructions  Make sure you get all the shots (vaccines) that your doctor recommends. Ask your doctor about a flu shot and a pneumonia shot.  Use oxygen therapy and pulmonary rehabilitation if told by your doctor. If you need home oxygen therapy, ask your doctor if you should buy a tool to measure your oxygen level (oximeter).  Make a COPD action plan with your doctor. This helps you to know what to do if you feel worse than usual.  Manage any other conditions you have as told by your doctor.  Avoid going outside when it is very hot, cold, or humid.  Avoid people who have a sickness you can catch (contagious).  Keep all follow-up visits as told by your doctor. This is important. Contact a doctor if:  You cough up more mucus than usual.  There is a change in the color or thickness of the mucus.  It is harder to breathe than usual.  Your breathing is faster than usual.  You have trouble sleeping.  You need to use your medicines more often than usual.  You have trouble doing your normal activities such as getting dressed   or walking around the house. Get help right away if:  You have shortness of breath while resting.  You have shortness of breath that stops you from: ? Being able to talk. ? Doing normal activities.  Your chest hurts for longer than 5 minutes.  Your skin color is more blue than usual.  Your pulse oximeter shows that you have low oxygen for longer than 5 minutes.  You have a fever.  You feel too tired to breathe normally. Summary  Chronic obstructive pulmonary disease (COPD) is a long-term lung problem.  The way your  lungs work will never return to normal. Usually the condition gets worse over time. There are things you can do to keep yourself as healthy as possible.  Take over-the-counter and prescription medicines only as told by your doctor.  If you smoke, stop. Smoking makes the problem worse. This information is not intended to replace advice given to you by your health care provider. Make sure you discuss any questions you have with your health care provider. Document Revised: 09/16/2017 Document Reviewed: 11/08/2016 Elsevier Patient Education  2020 [employer]  

## 2020-03-25 NOTE — Progress Notes (Signed)
Patient c/o abdominal discomfort accompanied by nausea but no emesis. Dr. Sherryll Burger notified and seen patient. Orders noted for CT angiogram and U/S of abdomen; cancel d/c; resume cardiac monitoring.

## 2020-03-25 NOTE — Progress Notes (Signed)
Progress Note  Patient Name: [name] Date of Encounter: 03/25/2020  [facility] Cardiologist: Cathlyn Parsons  Subjective   Patient complains primarily of headache with right-sided facial pain.  She also has some intermittent shortness of breath, which seems to worsen after receiving her nebulizer treatments.  She denies chest pain, palpitations, and edema.  Inpatient Medications    Scheduled Meds: . aspirin  81 mg Oral Daily  . clonazePAM  0.5 mg Oral BID  . diltiazem  120 mg Oral Daily  . enoxaparin (LOVENOX) injection  40 mg Subcutaneous Q24H  . famotidine  20 mg Oral Daily  . furosemide  40 mg Intravenous BID  . guaiFENesin  600 mg Oral BID  . insulin aspart  0-15 Units Subcutaneous TID PC & HS  . ipratropium  2.5 mL Inhalation Q6H  . pantoprazole  40 mg Oral Daily  . sodium chloride flush  3 mL Intravenous Q12H   Continuous Infusions: . sodium chloride    . levofloxacin (LEVAQUIN) IV     PRN Meds: sodium chloride, chlorpheniramine-HYDROcodone, fluticasone, hydrALAZINE, HYDROmorphone, ketorolac, levalbuterol, ondansetron (ZOFRAN) IV, sodium chloride flush, zolpidem   Vital Signs    Vitals:   03/25/20 0239 03/25/20 0248 03/25/20 0732 03/25/20 0732  BP:  (!) 157/82  (!) 150/88  Pulse:  (!) 120  (!) 110  Resp:  20  18  Temp:  98 F (36.7 C)  98.2 F (36.8 C)  TempSrc:  Oral  Oral  SpO2: 92% 92% 93% 93%  Weight:  65.2 kg    Height:        Intake/Output Summary (Last 24 hours) at 03/25/2020 1131 Last data filed at 03/25/2020 0945 Gross per 24 hour  Intake 720 ml  Output 2550 ml  Net -1830 ml   Last 3 Weights 03/25/2020 03/24/2020 03/23/2020  Weight (lbs) 143 lb 12.8 oz 144 lb 12.8 oz 151 lb 14.4 oz  Weight (kg) 65.227 kg 65.681 kg 68.9 kg      Telemetry    Sinus tachycardia with PACs and single run of NSVT (7 beats). - Personally Reviewed  ECG    No new tracing.  Physical Exam   GEN: No acute distress.   Neck: No JVD or HJR Cardiac:   Tachycardic but regular without murmurs, rubs, or gallops. Respiratory:  Coarse breath sounds bilaterally without wheezes or crackles. GI: Soft with mild upper abdominal tenderness. MS: No edema; No deformity. Neuro:  Nonfocal  Psych: Normal affect   Labs    High Sensitivity Troponin:   Recent Labs  Lab 03/22/20 1419 03/22/20 1703  TROPONINIHS 31* 42*      Chemistry Recent Labs  Lab 03/22/20 1345 03/22/20 1345 03/23/20 0527 03/24/20 0454 03/25/20 0423  NA 136   < > 138 132* 136  K 2.9*   < > 3.3* 3.7 3.6  CL 97*   < > 96* 86* 89*  CO2 24   < > 28 29 33*  GLUCOSE 248*   < > 196* 285* 131*  BUN 10   < > 8 19 25*  CREATININE 0.86   < > 0.97 1.15* 1.07*  CALCIUM 9.5   < > 9.0 9.4 9.1  PROT 8.0  --   --   --   --   ALBUMIN 4.3  --   --   --   --   AST 40  --   --   --   --   ALT 18  --   --   --   --  ALKPHOS 121  --   --   --   --   BILITOT 0.6  --   --   --   --   GFRNONAA >60   < > >60 51* 56*  GFRAA >60   < > >60 59* >60  ANIONGAP 15   < > 14 17* 14   < > = values in this interval not displayed.     Hematology Recent Labs  Lab 03/23/20 0527 03/24/20 0454 03/25/20 0423  WBC 29.0* 25.1* 26.1*  RBC 4.68 5.37* 5.46*  HGB 13.3 15.3* 15.6*  HCT 40.3 46.0 46.5*  MCV 86.1 85.7 85.2  MCH 28.4 28.5 28.6  MCHC 33.0 33.3 33.5  RDW 13.3 13.3 13.2  PLT 457* 571* 505*    BNP Recent Labs  Lab 03/22/20 1419  BNP 255.4*     DDimer No results for input(s): DDIMER in the last 168 hours.   Radiology    ECHOCARDIOGRAM COMPLETE  Result Date: 03/24/2020    ECHOCARDIOGRAM REPORT   Patient Name:   [name] [facility] Date of Exam: 03/24/2020 Medical Rec #:  [id number]         Height:       63.0 in Accession #:    [id number]        Weight:       144.8 lb Date of Birth:  1956-02-05         BSA:          1.686 m Patient Age:    64 years          BP:           163/87 mmHg Patient Gender: F                 HR:           114 bpm. Exam Location:  [facility] Procedure: 2D Echo, Cardiac  Doppler and Color Doppler Indications:     Dyspnea 786.09  History:         Patient has no prior history of Echocardiogram examinations.                  Prior CABG, COPD; Risk Factors:Diabetes. Migraine.  Sonographer:     Cristela Blue RDCS (AE) Referring Phys:  [id number] Vernetta Honey MANSY Diagnosing Phys: Lorine Bears MD IMPRESSIONS  1. Left ventricular ejection fraction, by estimation, is 55 to 60%. The left ventricle has normal function. The left ventricle has no regional wall motion abnormalities. There is moderate left ventricular hypertrophy. Left ventricular diastolic parameters are consistent with Grade I diastolic dysfunction (impaired relaxation).  2. Right ventricular systolic function is normal. The right ventricular size is normal. Tricuspid regurgitation signal is inadequate for assessing PA pressure.  3. The mitral valve is normal in structure. No evidence of mitral valve regurgitation. No evidence of mitral stenosis.  4. The aortic valve is normal in structure. Aortic valve regurgitation is not visualized. No aortic stenosis is present.  5. The inferior vena cava is normal in size with greater than 50% respiratory variability, suggesting right atrial pressure of 3 mmHg. FINDINGS  Left Ventricle: Left ventricular ejection fraction, by estimation, is 55 to 60%. The left ventricle has normal function. The left ventricle has no regional wall motion abnormalities. The left ventricular internal cavity size was normal in size. There is  moderate left ventricular hypertrophy. Left ventricular diastolic parameters are consistent with Grade I diastolic dysfunction (impaired relaxation). Right Ventricle: The right ventricular size is  normal. No increase in right ventricular wall thickness. Right ventricular systolic function is normal. Tricuspid regurgitation signal is inadequate for assessing PA pressure. The tricuspid regurgitant velocity is 1.75 m/s, and with an assumed right atrial pressure of 10 mmHg, the  estimated right ventricular systolic pressure is 48.5 mmHg. Left Atrium: Left atrial size was normal in size. Right Atrium: Right atrial size was normal in size. Pericardium: There is no evidence of pericardial effusion. Mitral Valve: The mitral valve is normal in structure. Normal mobility of the mitral valve leaflets. No evidence of mitral valve regurgitation. No evidence of mitral valve stenosis. Tricuspid Valve: The tricuspid valve is normal in structure. Tricuspid valve regurgitation is not demonstrated. No evidence of tricuspid stenosis. Aortic Valve: The aortic valve is normal in structure. Aortic valve regurgitation is not visualized. No aortic stenosis is present. Aortic valve mean gradient measures 3.0 mmHg. Aortic valve peak gradient measures 6.2 mmHg. Aortic valve area, by VTI measures 3.57 cm. Pulmonic Valve: The pulmonic valve was normal in structure. Pulmonic valve regurgitation is not visualized. No evidence of pulmonic stenosis. Aorta: The aortic root is normal in size and structure. Venous: The inferior vena cava is normal in size with greater than 50% respiratory variability, suggesting right atrial pressure of 3 mmHg. IAS/Shunts: No atrial level shunt detected by color flow Doppler.  LEFT VENTRICLE PLAX 2D LVIDd:         3.14 cm  Diastology LVIDs:         2.10 cm  LV e' lateral:   7.83 cm/s LV PW:         1.48 cm  LV E/e' lateral: 10.8 LV IVS:        1.54 cm  LV e' medial:    5.11 cm/s LVOT diam:     2.00 cm  LV E/e' medial:  16.6 LV SV:         64 LV SV Index:   38 LVOT Area:     3.14 cm  RIGHT VENTRICLE RV Basal diam:  2.57 cm RV S prime:     10.90 cm/s TAPSE (M-mode): 3.0 cm LEFT ATRIUM             Index       RIGHT ATRIUM           Index LA diam:        3.70 cm 2.20 cm/m  RA Area:     14.60 cm LA Vol (A2C):   27.9 ml 16.55 ml/m RA Volume:   34.40 ml  20.41 ml/m LA Vol (A4C):   24.1 ml 14.30 ml/m LA Biplane Vol: 28.7 ml 17.03 ml/m  AORTIC VALVE AV Area (Vmax):    2.89 cm AV Area  (Vmean):   3.03 cm AV Area (VTI):     3.57 cm AV Vmax:           125.00 cm/s AV Vmean:          77.100 cm/s AV VTI:            0.178 m AV Peak Grad:      6.2 mmHg AV Mean Grad:      3.0 mmHg LVOT Vmax:         115.00 cm/s LVOT Vmean:        74.400 cm/s LVOT VTI:          0.203 m LVOT/AV VTI ratio: 1.14  AORTA Ao Root diam: 2.80 cm MITRAL VALVE  TRICUSPID VALVE MV Area (PHT): 5.38 cm     TR Peak grad:   12.2 mmHg MV Decel Time: 141 msec     TR Vmax:        175.00 cm/s MV E velocity: 84.90 cm/s MV A velocity: 121.00 cm/s  SHUNTS MV E/A ratio:  0.70         Systemic VTI:  0.20 m                             Systemic Diam: 2.00 cm Lorine Bears MD Electronically signed by Lorine Bears MD Signature Date/Time: 03/24/2020/4:45:32 PM    Final     Cardiac Studies   TTE (03/24/20): 1. Left ventricular ejection fraction, by estimation, is 55 to 60%. The  left ventricle has normal function. The left ventricle has no regional  wall motion abnormalities. There is moderate left ventricular hypertrophy.  Left ventricular diastolic  parameters are consistent with Grade I diastolic dysfunction (impaired  relaxation).  2. Right ventricular systolic function is normal. The right ventricular  size is normal. Tricuspid regurgitation signal is inadequate for assessing  PA pressure.  3. The mitral valve is normal in structure. No evidence of mitral valve  regurgitation. No evidence of mitral stenosis.  4. The aortic valve is normal in structure. Aortic valve regurgitation is  not visualized. No aortic stenosis is present.  5. The inferior vena cava is normal in size with greater than 50%  respiratory variability, suggesting right atrial pressure of 3 mmHg.   Patient Profile     64 y.o. female coronary artery disease status post CABG in 2017, essential hypertension with intolerance to multiple medications, hyperlipidemia with intolerance to statins and PCSK9 inhibitor, mild nonobstructive renal  artery stenosis, type 2 diabetes, COPD/asthma with continued tobacco use, anxiety and chronic kidney disease, whom we are seeing due to elevated heart rate and blood pressure on a background of coronary artery disease.  Assessment & Plan    Sinus tachycardia and NSVT: I suspect that acute medical illness is likely driving the patient's tachycardia.  She has tolerated a single dose of low-dose diltiazem well, which should be continued.  This can be gradually escalated over the coming days or on an outpatient basis.  Electrolytes should be repleted to maintain potassium and magnesium levels greater than 4.0 and 2.0, respectively.  Coronary artery disease: Dyspnea unlikely to be due to worsening coronary insufficiency.  The patient denies angina.  Continue current medications for secondary prevention.  Long-term lipid therapy will need to be readdressed at follow-up, given the patient's reported intolerance to statins and PCSK9 inhibitors.  Leg edema: Resolved with diuresis.  I recommend transitioning IV furosemide to oral dosing (20 mg daily).  Obstructive lung disease exacerbation: Potentially driving dyspnea and elevated heart rates.  Ongoing management per primary service.  Leukocytosis: Concern for sepsis but no obvious source.  Ongoing workup and treatment per internal medicine.  For questions or updates, please contact [facility] Please consult [email or web address] for contact info under   Signed, Yvonne Kendall, MD  03/25/2020, 11:31 AM

## 2020-03-25 NOTE — Progress Notes (Signed)
PROGRESS NOTE    [name]   QMG:[id number]  DOB: [birth year]  PCP: Patient, No Pcp Per    DOA: 03/22/2020 LOS: 3   Brief Narrative   [name]  is a 63 y.o. Caucasian female with a history of asthma and COPD, hypertension, CAD s/p four-vessel CABG and migraine, who presented to the ED on 03/22/20 with chest tightness with mild dyspnea and wheezing.  Reported associated low grade fevers and tachycardia at rest with occasional paroxysmal nocturnal dyspnea.  In the ED, temp initially 93F >> 100F, BP uncontrolled at 206/102, HR 105 to 120's.  Labs notable for K 2.9, glucose 248, BNP 255.4, troponin 31 >> 42, initial lactate 4.3 >> 2.1.  CBC with leukocytosis of 29.5k with neutrophilia and thrombocytosis.  UA showed glucosuria, no infection.  COVID-19 PCR negative.  (Not yet vaccinated).  CXR negative for acute findings.  CT's chest/abdomen/pelvis showed severe calcification of the aortic arch with no other acute chest abnormalities, small to moderate sized hiatal hernia and noninflamed diverticula throughout the large bowel and hepatic small cyst or hemangioma, grade 1 anterolisthesis of L4 on L5, aortic atherosclerosis and mild to moderate severity multilevel degenerative changes throughout the lumbar spine most prominent at L1-L2.  Patient was treated with Cefepime in the ED, subsequently developed anaphylactic reaction for which IM epinephrine, IV Benadryl, IV Pepcid and 125 mg Solu-Medrol were given.  Small LR bolus and IV Vancomycin, and albuterol neb was given.  Patient admitted to [facility] on hospitalist service for further evaluation and management.       Assessment & Plan   Principal Problem:   Asthma exacerbation Active Problems:   Hyperlipidemia   Migraine   PERIPHERAL VASCULAR DISEASE   DEGENERATIVE DISC DISEASE, LUMBAR SPINE   Essential hypertension   Coronary artery disease of native artery of native heart with stable angina pectoris (HCC)   Hx of CABG   Anaphylactic  reaction   Hypertensive urgency   Sepsis secondary to acute bronchitis with secondary Asthma and COPD acute exacerbation Patient presented with fever, leukocytosis and lactic acidosis.  Has not been hypoxic. --Xopenex nebs q6h prn (due to tachycardia) --Atrovent MDI q6h --will stop steroids as she is not wheezing, she reports anxiety which can be due to steroids. --continue Levaquin for total 5 days --hold home Stiolto respimat --pulmonology input appreciated  Tachycardia - POA, persistent today.  Some better on cardizem. continue norvasc  --Telemetry  --cardio following --stop steroids on 6/7 - CT Angio neg for PE on 6/8  Elevated BNP - POA with BNP 255.3.  Suspect diastolic CHF vs early cor pulmonale, possibly due to HTN urgency.  No known history of CHF.  History provided indicates possible PND/orthopnea. --Echo pending --IV Lasix --monitor I/O's & daily weights  Hypertensive urgency - POA.  Improved. Essential Hypertension - continue home amlodipine, cardizem, PRN hydralazine.  Anaphylaxis secondary to cefepime/cephalosporins - occurred in the ED, resolved with treatment outlined above.  Continue IV steroids for asthma/COPD.  IV Pepcid and Benadryl were continued today, d/c this evening if stable and no further symptoms.  Hypokalemia - replaced.  Monitor and replace as needed.  Type 2 diabetes mellitus, uncontrolled - A1c 7.9%. --sliding scale NovoLog and CBG's AC/HS. Off steroids - should improve sugars.  Tobacco abuse - nicotine patch available.  Smoking cessation counseling.  PVD - continue ASA.  Overweight, near obesity: Body mass index is 25.47 kg/m.  Complicates overall care and prognosis.  At the time of D/C by nursing, she  was pale and diaphoretic; holding abdomen and c/o nausea.  Pt stating "I don't think I am ready to go home." - I went back at around 6 pm to see the patient. Vitals normal. CT angio neg for PE, will get RUQ [place] to r/o gall stone as  etio  DVT prophylaxis: Lovenox  Diet:  Diet Orders (From admission, onward)    Start     Ordered   03/25/20 0000  Diet - low sodium heart healthy     03/25/20 1531   03/22/20 2103  Diet Carb Modified Fluid consistency: Thin; Room service appropriate? Yes  Diet effective now    Comments: HEART HEALTHY LOW SALT  Question Answer Comment  Diet-HS Snack? Nothing   Calorie Level Medium 1600-2000   Fluid consistency: Thin   Room service appropriate? Yes      03/22/20 2102            Code Status: Full Code    Subjective 03/25/20   Was agreeable with D/C, HR much better controlled on cardizem Disposition Plan & Communication   Status is: Inpatient  Inpatient status remains appropriate due to severity of illness and need for IV therapies at this time.  Dispo: The patient is from: Home              Anticipated d/c is to: Home              Anticipated d/c date is: 1-2 days              Patient currently is not medically stable for discharge.was scheduled for D/C today but at the time of D/C by nursing - she was noted to be pale and diaphoretic; holding abdomen and c/o nausea.  Pt stating "I don't think I am ready to go home."  and D/C canceled    Family Communication: updated husband at bedside - d/w patient   Consults, Procedures, Significant Events   Consultants:   Pulmonology  Cardiology   Antimicrobials:   Levaquin 6/5 >>  Significant Events: Anaphylactic reaction to Cefepime in ED on 03/22/20  Objective   Vitals:   03/25/20 1642 03/25/20 1810 03/25/20 2006 03/25/20 2015  BP: (!) 150/75 (!) 143/69 (!) 117/56   Pulse: (!) 104 (!) 105 (!) 112   Resp: Temp: 97.7 F (36.5 C) 98.2 F (36.8 C) 97.8 F (36.6 C)   TempSrc:  Oral Oral   SpO2: (!) 88% (!) 83% 93% 93%  Weight:      Height:        Intake/Output Summary (Last 24 hours) at 03/25/2020 2058 Last data filed at 03/25/2020 1816 Gross per 24 hour  Intake 360 ml  Output 1800 ml  Net -1440 ml    Filed Weights   03/23/20 0500 03/24/20 0347 03/25/20 0248  Weight: 68.9 kg 65.7 kg 65.2 kg    Physical Exam:  General exam: awake, alert, no acute distress HEENT: no oropharyngeal swelling, moist mucus membranes, hearing grossly normal  Respiratory system: decreased breath sounds with expiratory wheezes worse on right than left, no rhonchi, normal respiratory effort. Cardiovascular system: normal S1/S2, tachycardic, regular rhythm, no pedal edema.   Gastrointestinal system: soft, NT, ND, no HSM felt, +bowel sounds. Central nervous system: A&O x4 no gross focal neurologic deficits, normal speech Extremities: moves all, no edema, normal tone Skin: dry, intact, normal temperature, normal color Psychiatry: anxious mood, congruent affect, extremely talkative with tangential thought content  Labs   Data Reviewed:  I have personally reviewed following labs and imaging studies  CBC: Recent Labs  Lab 03/22/20 1345 03/23/20 0527 03/24/20 0454 03/25/20 0423  WBC 29.5* 29.0* 25.1* 26.1*  NEUTROABS 25.5* 26.6* 22.3* 19.6*  HGB 13.7 13.3 15.3* 15.6*  HCT 41.3 40.3 46.0 46.5*  MCV 84.8 86.1 85.7 85.2  PLT 525* 457* 571* 505*   Basic Metabolic Panel: Recent Labs  Lab 03/22/20 1345 03/23/20 0527 03/24/20 0454 03/25/20 0423  NA 136 138 132* 136  K 2.9* 3.3* 3.7 3.6  CL 97* 96* 86* 89*  CO2 33*  GLUCOSE 248* 196* 285* 131*  BUN 25*  CREATININE 0.86 0.97 1.15* 1.07*  CALCIUM 9.5 9.0 9.4 9.1  MG  --  1.7 2.9*  --    GFR: Estimated Creatinine Clearance: 49.5 mL/min (A) (by C-G formula based on SCr of 1.07 mg/dL (H)). Liver Function Tests: Recent Labs  Lab 03/22/20 1345  AST 40  ALT 18  ALKPHOS 121  BILITOT 0.6  PROT 8.0  ALBUMIN 4.3   No results for input(s): LIPASE, AMYLASE in the last 168 hours. No results for input(s): AMMONIA in the last 168 hours. Coagulation Profile: Recent Labs  Lab 03/22/20 1345  INR 1.0   Cardiac Enzymes: No results for  input(s): CKTOTAL, CKMB, CKMBINDEX, TROPONINI in the last 168 hours. BNP (last 3 results) No results for input(s): PROBNP in the last 8760 hours. HbA1C: Recent Labs    03/23/20 0527  HGBA1C 7.9*   CBG: Recent Labs  Lab 03/24/20 2057 03/25/20 0734 03/25/20 1155 03/25/20 1641 03/25/20 2044  GLUCAP 206* 175* 278* 86 180*   Lipid Profile: No results for input(s): CHOL, HDL, LDLCALC, TRIG, CHOLHDL, LDLDIRECT in the last 72 hours. Thyroid Function Tests: No results for input(s): TSH, T4TOTAL, FREET4, T3FREE, THYROIDAB in the last 72 hours. Anemia Panel: No results for input(s): VITAMINB12, FOLATE, FERRITIN, TIBC, IRON, RETICCTPCT in the last 72 hours. Sepsis Labs: Recent Labs  Lab 03/22/20 1345 03/22/20 1703 03/23/20 0527 03/23/20 0907 03/24/20 0454 03/25/20 0423  PROCALCITON  --   --  0.18  --  0.18 0.10  LATICACIDVEN 4.3* 2.1* 3.4* 2.6*  --   --     Recent Results (from the past 240 hour(s))  Culture, blood (Routine x 2)     Status: None (Preliminary result)   Collection Time: 03/22/20  1:45 PM   Specimen: BLOOD  Result Value Ref Range Status   Specimen Description BLOOD LEFT FOREARM  Final   Special Requests   Final    BOTTLES DRAWN AEROBIC AND ANAEROBIC Blood Culture adequate volume   Culture   Final    NO GROWTH 3 DAYS Performed at [facility], [address], [place], [place] [address]    Report Status PENDING  Incomplete  Culture, blood (Routine x 2)     Status: None (Preliminary result)   Collection Time: 03/22/20  2:19 PM   Specimen: BLOOD  Result Value Ref Range Status   Specimen Description BLOOD BLOOD LEFT FOREARM  Final   Special Requests   Final    BOTTLES DRAWN AEROBIC AND ANAEROBIC Blood Culture results may not be optimal due to an excessive volume of blood received in culture bottles   Culture   Final    NO GROWTH 3 DAYS Performed at [facility], [address], [place], [place] [address]    Report Status PENDING   Incomplete  SARS Coronavirus 2 by RT PCR (hospital order, performed  in [facility] hospital lab) Nasopharyngeal Nasopharyngeal Swab     Status: None   Collection Time: 03/22/20  2:19 PM   Specimen: Nasopharyngeal Swab  Result Value Ref Range Status   SARS Coronavirus 2 NEGATIVE NEGATIVE Final    Comment: (NOTE) SARS-CoV-2 target nucleic acids are NOT DETECTED. The SARS-CoV-2 RNA is generally detectable in upper and lower respiratory specimens during the acute phase of infection. The lowest concentration of SARS-CoV-2 viral copies this assay can detect is 250 copies / mL. A negative result does not preclude SARS-CoV-2 infection and should not be used as the sole basis for treatment or other patient management decisions.  A negative result may occur with improper specimen collection / handling, submission of specimen other than nasopharyngeal swab, presence of viral mutation(s) within the areas targeted by this assay, and inadequate number of viral copies (<250 copies / mL). A negative result must be combined with clinical observations, patient history, and epidemiological information. Fact Sheet for Patients:   BoilerBrush.com.cy Fact Sheet for Healthcare Providers: https://pope.com/ This test is not yet approved or cleared  by the [place] FDA and has been authorized for detection and/or diagnosis of SARS-CoV-2 by FDA under an Emergency Use Authorization (EUA).  This EUA will remain in effect (meaning this test can be used) for the duration of the COVID-19 declaration under Section 564(b)(1) of the Act, 21 U.S.C. section 360bbb-3(b)(1), unless the authorization is terminated or revoked sooner. Performed at [facility], [address] [place], [place] [address]   Respiratory Panel by PCR     Status: None   Collection Time: 03/23/20  4:37 PM   Specimen: Nasopharyngeal Swab; Respiratory  Result Value Ref Range Status    Adenovirus NOT DETECTED NOT DETECTED Final   Coronavirus 229E NOT DETECTED NOT DETECTED Final    Comment: (NOTE) The Coronavirus on the Respiratory Panel, DOES NOT test for the novel  Coronavirus (2019 nCoV)    Coronavirus HKU1 NOT DETECTED NOT DETECTED Final   Coronavirus NL63 NOT DETECTED NOT DETECTED Final   Coronavirus OC43 NOT DETECTED NOT DETECTED Final   Metapneumovirus NOT DETECTED NOT DETECTED Final   Rhinovirus / Enterovirus NOT DETECTED NOT DETECTED Final   Influenza A NOT DETECTED NOT DETECTED Final   Influenza B NOT DETECTED NOT DETECTED Final   Parainfluenza Virus 1 NOT DETECTED NOT DETECTED Final   Parainfluenza Virus 2 NOT DETECTED NOT DETECTED Final   Parainfluenza Virus 3 NOT DETECTED NOT DETECTED Final   Parainfluenza Virus 4 NOT DETECTED NOT DETECTED Final   Respiratory Syncytial Virus NOT DETECTED NOT DETECTED Final   Bordetella pertussis NOT DETECTED NOT DETECTED Final   Chlamydophila pneumoniae NOT DETECTED NOT DETECTED Final   Mycoplasma pneumoniae NOT DETECTED NOT DETECTED Final    Comment: Performed at [facility], [address], [place], [place] [address]  MRSA PCR Screening     Status: None   Collection Time: 03/23/20  4:37 PM  Result Value Ref Range Status   MRSA by PCR NEGATIVE NEGATIVE Final    Comment:        The GeneXpert MRSA Assay (FDA approved for NASAL specimens only), is one component of a comprehensive MRSA colonization surveillance program. It is not intended to diagnose MRSA infection nor to guide or monitor treatment for MRSA infections. Performed at [facility], [address], [place], [place] [address]       Imaging Studies   CT ANGIO CHEST PE W OR WO CONTRAST  Result Date: 03/25/2020 CLINICAL DATA:  Intermittent shortness of breath.  Headache. EXAM: CT ANGIOGRAPHY CHEST WITH CONTRAST TECHNIQUE: Multidetector CT imaging of the chest was performed using the standard protocol during bolus administration of  intravenous contrast. Multiplanar CT image reconstructions and MIPs were obtained to evaluate the vascular anatomy. CONTRAST:  75 mL OMNIPAQUE IOHEXOL 350 MG/ML SOLN COMPARISON:  CT chest, abdomen and pelvis 03/22/2020. FINDINGS: Cardiovascular: Satisfactory opacification of the pulmonary arteries to the segmental level. No evidence of pulmonary embolism. Normal heart size. No pericardial effusion. Atherosclerosis. Status post CABG. Mediastinum/Nodes: No enlarged mediastinal, hilar, or axillary lymph nodes. Thyroid gland, trachea, and esophagus demonstrate no significant findings. Small hiatal hernia noted. Lungs/Pleura: No pleural effusion. Lungs are clear. There is some emphysematous change in the apices. Upper Abdomen: Negative. Musculoskeletal: No acute or focal bony abnormality. Review of the MIP images confirms the above findings. IMPRESSION: Negative for pulmonary embolus or acute disease. Small hiatal hernia. Aortic Atherosclerosis (ICD10-I70.0) and Emphysema (ICD10-J43.9). Electronically Signed   By: Inge Rise M.D.   On: 03/25/2020 19:44   ECHOCARDIOGRAM COMPLETE  Result Date: 03/24/2020    ECHOCARDIOGRAM REPORT   Patient Name:   LINDEN MIKES [facility] Date of Exam: 03/24/2020 Medical Rec #:  [id number]         Height:       63.0 in Accession #:    [id number]        Weight:       144.8 lb Date of Birth:  [birth year]         BSA:          1.686 m Patient Age:    4 years          BP:           163/87 mmHg Patient Gender: F                 HR:           114 bpm. Exam Location:  [facility] Procedure: 2D Echo, Cardiac Doppler and Color Doppler Indications:     Dyspnea 786.09  History:         Patient has no prior history of Echocardiogram examinations.                  Prior CABG, COPD; Risk Factors:Diabetes. Migraine.  Sonographer:     Sherrie Sport RDCS (AE) Referring Phys:  [id number] Arvella Merles MANSY Diagnosing Phys: Kathlyn Sacramento MD IMPRESSIONS  1. Left ventricular ejection fraction, by estimation, is 55 to 60%. The left  ventricle has normal function. The left ventricle has no regional wall motion abnormalities. There is moderate left ventricular hypertrophy. Left ventricular diastolic parameters are consistent with Grade I diastolic dysfunction (impaired relaxation).  2. Right ventricular systolic function is normal. The right ventricular size is normal. Tricuspid regurgitation signal is inadequate for assessing PA pressure.  3. The mitral valve is normal in structure. No evidence of mitral valve regurgitation. No evidence of mitral stenosis.  4. The aortic valve is normal in structure. Aortic valve regurgitation is not visualized. No aortic stenosis is present.  5. The inferior vena cava is normal in size with greater than 50% respiratory variability, suggesting right atrial pressure of 3 mmHg. FINDINGS  Left Ventricle: Left ventricular ejection fraction, by estimation, is 55 to 60%. The left ventricle has normal function. The left ventricle has no regional wall motion abnormalities. The left ventricular internal cavity size was normal in size. There is  moderate left ventricular hypertrophy.  Left ventricular diastolic parameters are consistent with Grade I diastolic dysfunction (impaired relaxation). Right Ventricle: The right ventricular size is normal. No increase in right ventricular wall thickness. Right ventricular systolic function is normal. Tricuspid regurgitation signal is inadequate for assessing PA pressure. The tricuspid regurgitant velocity is 1.75 m/s, and with an assumed right atrial pressure of 10 mmHg, the estimated right ventricular systolic pressure is 22.2 mmHg. Left Atrium: Left atrial size was normal in size. Right Atrium: Right atrial size was normal in size. Pericardium: There is no evidence of pericardial effusion. Mitral Valve: The mitral valve is normal in structure. Normal mobility of the mitral valve leaflets. No evidence of mitral valve regurgitation. No evidence of mitral valve stenosis. Tricuspid  Valve: The tricuspid valve is normal in structure. Tricuspid valve regurgitation is not demonstrated. No evidence of tricuspid stenosis. Aortic Valve: The aortic valve is normal in structure. Aortic valve regurgitation is not visualized. No aortic stenosis is present. Aortic valve mean gradient measures 3.0 mmHg. Aortic valve peak gradient measures 6.2 mmHg. Aortic valve area, by VTI measures 3.57 cm. Pulmonic Valve: The pulmonic valve was normal in structure. Pulmonic valve regurgitation is not visualized. No evidence of pulmonic stenosis. Aorta: The aortic root is normal in size and structure. Venous: The inferior vena cava is normal in size with greater than 50% respiratory variability, suggesting right atrial pressure of 3 mmHg. IAS/Shunts: No atrial level shunt detected by color flow Doppler.  LEFT VENTRICLE PLAX 2D LVIDd:         3.14 cm  Diastology LVIDs:         2.10 cm  LV e' lateral:   7.83 cm/s LV PW:         1.48 cm  LV E/e' lateral: 10.8 LV IVS:        1.54 cm  LV e' medial:    5.11 cm/s LVOT diam:     2.00 cm  LV E/e' medial:  16.6 LV SV:         64 LV SV Index:   38 LVOT Area:     3.14 cm  RIGHT VENTRICLE RV Basal diam:  2.57 cm RV S prime:     10.90 cm/s TAPSE (M-mode): 3.0 cm LEFT ATRIUM             Index       RIGHT ATRIUM           Index LA diam:        3.70 cm 2.20 cm/m  RA Area:     14.60 cm LA Vol (A2C):   27.9 ml 16.55 ml/m RA Volume:   34.40 ml  20.41 ml/m LA Vol (A4C):   24.1 ml 14.30 ml/m LA Biplane Vol: 28.7 ml 17.03 ml/m  AORTIC VALVE AV Area (Vmax):    2.89 cm AV Area (Vmean):   3.03 cm AV Area (VTI):     3.57 cm AV Vmax:           125.00 cm/s AV Vmean:          77.100 cm/s AV VTI:            0.178 m AV Peak Grad:      6.2 mmHg AV Mean Grad:      3.0 mmHg LVOT Vmax:         115.00 cm/s LVOT Vmean:        74.400 cm/s LVOT VTI:          0.203 m LVOT/AV VTI  ratio: 1.14  AORTA Ao Root diam: 2.80 cm MITRAL VALVE                TRICUSPID VALVE MV Area (PHT): 5.38 cm     TR Peak  grad:   12.2 mmHg MV Decel Time: 141 msec     TR Vmax:        175.00 cm/s MV E velocity: 84.90 cm/s MV A velocity: 121.00 cm/s  SHUNTS MV E/A ratio:  0.70         Systemic VTI:  0.20 m                             Systemic Diam: 2.00 cm Lorine BearsMuhammad Arida MD Electronically signed by Lorine BearsMuhammad Arida MD Signature Date/Time: 03/24/2020/4:45:32 PM    Final      Medications   Scheduled Meds: . aspirin  81 mg Oral Daily  . clonazePAM  0.5 mg Oral BID  . diltiazem  120 mg Oral Daily  . enoxaparin (LOVENOX) injection  40 mg Subcutaneous Q24H  . famotidine  20 mg Oral Daily  . guaiFENesin  600 mg Oral BID  . insulin aspart  0-15 Units Subcutaneous TID PC & HS  . ipratropium  2.5 mL Inhalation Q6H  . pantoprazole  40 mg Oral Daily  . sodium chloride flush  3 mL Intravenous Q12H   Continuous Infusions: . sodium chloride    . levofloxacin (LEVAQUIN) IV         LOS: 3 days    Time spent: 46 minutes with > 50% spent in coordination of care and direct patient contact.    Maryori Weide Sherryll BurgerShah, DO [facility]  03/25/2020, 8:58 PM    If 7PM-7AM, please contact night-coverage. How to contact the [facility] Attending or Consulting provider 7A - 7P or covering provider during after hours 7P -7A, for this patient?    1. Check the care team in [facility] and look for a) attending/consulting [facility] provider listed and b) the [facility] team listed 2. Log into [email or web address] and use [place]'s universal password to access. If you do not have the password, please contact the hospital operator. 3. Locate the [facility] provider you are looking for under [facility] and page to a number that you can be directly reached. 4. If you still have difficulty reaching the provider, please page the [facility] (Director on Call) for the Hospitalists listed on amion for assistance.

## 2020-03-26 ENCOUNTER — Inpatient Hospital Stay: Payer: 59

## 2020-03-26 DIAGNOSIS — M5137 Other intervertebral disc degeneration, lumbosacral region: Secondary | ICD-10-CM

## 2020-03-26 DIAGNOSIS — I479 Paroxysmal tachycardia, unspecified: Secondary | ICD-10-CM

## 2020-03-26 LAB — CBC WITH DIFFERENTIAL/PLATELET
Abs Immature Granulocytes: 0.26 10*3/uL — ABNORMAL HIGH (ref 0.00–0.07)
Basophils Absolute: 0.1 10*3/uL (ref 0.0–0.1)
Basophils Relative: 0 %
Eosinophils Absolute: 0 10*3/uL (ref 0.0–0.5)
Eosinophils Relative: 0 %
HCT: 45.9 % (ref 36.0–46.0)
Hemoglobin: 15.6 g/dL — ABNORMAL HIGH (ref 12.0–15.0)
Immature Granulocytes: 1 %
Lymphocytes Relative: 9 %
Lymphs Abs: 2.4 10*3/uL (ref 0.7–4.0)
MCH: 28.4 pg (ref 26.0–34.0)
MCHC: 34 g/dL (ref 30.0–36.0)
MCV: 83.5 fL (ref 80.0–100.0)
Monocytes Absolute: 1.8 10*3/uL — ABNORMAL HIGH (ref 0.1–1.0)
Monocytes Relative: 6 %
Neutro Abs: 23.6 10*3/uL — ABNORMAL HIGH (ref 1.7–7.7)
Neutrophils Relative %: 84 %
Platelets: 442 10*3/uL — ABNORMAL HIGH (ref 150–400)
RBC: 5.5 MIL/uL — ABNORMAL HIGH (ref 3.87–5.11)
RDW: 12.9 % (ref 11.5–15.5)
Smear Review: NORMAL
WBC: 28.1 10*3/uL — ABNORMAL HIGH (ref 4.0–10.5)
nRBC: 0 % (ref 0.0–0.2)

## 2020-03-26 LAB — BASIC METABOLIC PANEL
Anion gap: 15 (ref 5–15)
BUN: 23 mg/dL (ref 8–23)
CO2: 30 mmol/L (ref 22–32)
Calcium: 8.8 mg/dL — ABNORMAL LOW (ref 8.9–10.3)
Chloride: 86 mmol/L — ABNORMAL LOW (ref 98–111)
Creatinine, Ser: 1.04 mg/dL — ABNORMAL HIGH (ref 0.44–1.00)
GFR calc Af Amer: >60 mL/min (ref >60)
GFR calc non Af Amer: 58 mL/min — ABNORMAL LOW (ref >60)
Glucose, Bld: 183 mg/dL — ABNORMAL HIGH (ref 70–99)
Potassium: 3.6 mmol/L (ref 3.5–5.1)
Sodium: 131 mmol/L — ABNORMAL LOW (ref 135–145)

## 2020-03-26 LAB — GLUCOSE, CAPILLARY
Glucose-Capillary: 186 mg/dL — ABNORMAL HIGH (ref 70–99)
Glucose-Capillary: 171 mg/dL — ABNORMAL HIGH (ref 70–99)

## 2020-03-26 MED ORDER — DILTIAZEM HCL 30 MG PO TABS
60.0000 mg | ORAL_TABLET | Freq: Once | ORAL | Status: AC
  Administered 2020-03-26: 60 mg via ORAL
  Filled 2020-03-26: qty 2

## 2020-03-26 MED ORDER — ONDANSETRON HCL 4 MG PO TABS
4.0000 mg | ORAL_TABLET | Freq: Once | ORAL | Status: AC
  Administered 2020-03-26: 4 mg via ORAL
  Filled 2020-03-26: qty 1

## 2020-03-26 MED ORDER — DILTIAZEM HCL ER 180 MG PO CP24: 180.0000 mg | ORAL_CAPSULE | Freq: Every day | ORAL | 0 refills | Status: DC

## 2020-03-26 MED ORDER — DILTIAZEM HCL ER COATED BEADS 180 MG PO CP24: 180.0000 mg | ORAL_CAPSULE | Freq: Every day | ORAL | Status: DC

## 2020-03-26 NOTE — Plan of Care (Signed)
  Problem: Education: Goal: Knowledge of General Education information will improve Description Including pain rating scale, medication(s)/side effects and non-pharmacologic comfort measures Outcome: Progressing   

## 2020-03-26 NOTE — Progress Notes (Signed)
Progress Note  Patient Name: [name] Date of Encounter: 03/26/2020  [facility] Cardiologist: New- Dr. Fletcher Anon  Subjective   No acute events overnight, patient states doing okay.  Denies chest pain or palpitations  Inpatient Medications    Scheduled Meds: . aspirin  81 mg Oral Daily  . clonazePAM  0.5 mg Oral BID  . diltiazem  120 mg Oral Daily  . enoxaparin (LOVENOX) injection  40 mg Subcutaneous Q24H  . famotidine  20 mg Oral Daily  . guaiFENesin  600 mg Oral BID  . insulin aspart  0-15 Units Subcutaneous TID PC & HS  . ipratropium  2.5 mL Inhalation Q6H  . pantoprazole  40 mg Oral Daily  . sodium chloride flush  3 mL Intravenous Q12H   Continuous Infusions: . sodium chloride    . levofloxacin (LEVAQUIN) IV 750 mg (03/25/20 2109)   PRN Meds: sodium chloride, chlorpheniramine-HYDROcodone, fluticasone, hydrALAZINE, HYDROmorphone, ketorolac, levalbuterol, ondansetron (ZOFRAN) IV, sodium chloride flush, zolpidem   Vital Signs    Vitals:   03/26/20 0459 03/26/20 0834 03/26/20 0846 03/26/20 1132  BP:   139/65 (!) 153/76  Pulse:   (!) 117 96  Resp: 16  17 16   Temp:   98.6 F (37 C) 98.3 F (36.8 C)  TempSrc:   Oral Oral  SpO2:  94% 90% 90%  Weight:      Height:        Intake/Output Summary (Last 24 hours) at 03/26/2020 1401 Last data filed at 03/26/2020 0400 Gross per 24 hour  Intake 150 ml  Output 0 ml  Net 150 ml   Last 3 Weights 03/25/2020 03/24/2020 03/23/2020  Weight (lbs) 143 lb 12.8 oz 144 lb 12.8 oz 151 lb 14.4 oz  Weight (kg) 65.227 kg 65.681 kg 68.9 kg      Telemetry    Sinus rhythm, heart rate 99- Personally Reviewed  ECG    No new tracing obtained  Physical Exam   GEN: No acute distress.   Neck: No JVD Cardiac: RRR, no murmurs, rubs, or gallops.  Respiratory:  Expiratory wheezing GI: Soft, nontender, non-distended  MS: No edema; No deformity. Neuro:  Nonfocal  Psych: Normal affect   Labs    High Sensitivity Troponin:   Recent  Labs  Lab 03/22/20 1419 03/22/20 1703  TROPONINIHS 31* 42*      Chemistry Recent Labs  Lab 03/22/20 1345 03/23/20 0527 03/24/20 0454 03/25/20 0423 03/26/20 0531  NA 136   < > 132* 136 131*  K 2.9*   < > 3.7 3.6 3.6  CL 97*   < > 86* 89* 86*  CO2 24   < > 29 33* 30  GLUCOSE 248*   < > 285* 131* 183*  BUN 10   < > 19 25* 23  CREATININE 0.86   < > 1.15* 1.07* 1.04*  CALCIUM 9.5   < > 9.4 9.1 8.8*  PROT 8.0  --   --   --   --   ALBUMIN 4.3  --   --   --   --   AST 40  --   --   --   --   ALT 18  --   --   --   --   ALKPHOS 121  --   --   --   --   BILITOT 0.6  --   --   --   --   GFRNONAA >60   < > 51* 56*  58*  GFRAA >60   < > 59* >60 >60  ANIONGAP 15   < > 17* 14 15   < > = values in this interval not displayed.     Hematology Recent Labs  Lab 03/24/20 0454 03/25/20 0423 03/26/20 0531  WBC 25.1* 26.1* 28.1*  RBC 5.37* 5.46* 5.50*  HGB 15.3* 15.6* 15.6*  HCT 46.0 46.5* 45.9  MCV 85.7 85.2 83.5  MCH 28.5 28.6 28.4  MCHC 33.3 33.5 34.0  RDW 13.3 13.2 12.9  PLT 571* 505* 442*    BNP Recent Labs  Lab 03/22/20 1419  BNP 255.4*     DDimer No results for input(s): DDIMER in the last 168 hours.   Radiology    CT ANGIO CHEST PE W OR WO CONTRAST  Result Date: 03/25/2020 CLINICAL DATA:  Intermittent shortness of breath.  Headache. EXAM: CT ANGIOGRAPHY CHEST WITH CONTRAST TECHNIQUE: Multidetector CT imaging of the chest was performed using the standard protocol during bolus administration of intravenous contrast. Multiplanar CT image reconstructions and MIPs were obtained to evaluate the vascular anatomy. CONTRAST:  75 mL OMNIPAQUE IOHEXOL 350 MG/ML SOLN COMPARISON:  CT chest, abdomen and pelvis 03/22/2020. FINDINGS: Cardiovascular: Satisfactory opacification of the pulmonary arteries to the segmental level. No evidence of pulmonary embolism. Normal heart size. No pericardial effusion. Atherosclerosis. Status post CABG. Mediastinum/Nodes: No enlarged mediastinal,  hilar, or axillary lymph nodes. Thyroid gland, trachea, and esophagus demonstrate no significant findings. Small hiatal hernia noted. Lungs/Pleura: No pleural effusion. Lungs are clear. There is some emphysematous change in the apices. Upper Abdomen: Negative. Musculoskeletal: No acute or focal bony abnormality. Review of the MIP images confirms the above findings. IMPRESSION: Negative for pulmonary embolus or acute disease. Small hiatal hernia. Aortic Atherosclerosis (ICD10-I70.0) and Emphysema (ICD10-J43.9). Electronically Signed   By: Drusilla Kanner M.D.   On: 03/25/2020 19:44   US Abdomen Limited RUQ  Result Date: 03/26/2020 CLINICAL DATA:  Abdominal pain EXAM: ULTRASOUND ABDOMEN LIMITED RIGHT UPPER QUADRANT COMPARISON:  CT from 03/22/2020 FINDINGS: Gallbladder: Gallbladder is well distended. No gallstones or gallbladder sludge are seen. Echogenic foci are noted with ring down artifact in the gallbladder wall consistent with adenomyomatosis. Negative sonographic Murphy's sign is elicited. Common bile duct: Diameter: 3.5 mm. Liver: No focal lesion identified. Within normal limits in parenchymal echogenicity. Portal vein is patent on color Doppler imaging with normal direction of blood flow towards the liver. Other: None. IMPRESSION: Adenomyomatosis.  No evidence of cholelithiasis. Hypodensity within the liver on recent CT is not well appreciated on this exam. Electronically Signed   By: Alcide Clever M.D.   On: 03/26/2020 08:12    Cardiac Studies   TTE (03/24/20): 1. Left ventricular ejection fraction, by estimation, is 55 to 60%. The  left ventricle has normal function. The left ventricle has no regional  wall motion abnormalities. There is moderate left ventricular hypertrophy.  Left ventricular diastolic  parameters are consistent with Grade I diastolic dysfunction (impaired  relaxation).  2. Right ventricular systolic function is normal. The right ventricular  size is normal. Tricuspid  regurgitation signal is inadequate for assessing  PA pressure.  3. The mitral valve is normal in structure. No evidence of mitral valve  regurgitation. No evidence of mitral stenosis.  4. The aortic valve is normal in structure. Aortic valve regurgitation is  not visualized. No aortic stenosis is present.  5. The inferior vena cava is normal in size with greater than 50%  respiratory variability, suggesting right atrial pressure of 3 mmHg.  Patient Profile     64 y.o. female history of CAD/CABG 2017, hypertension, COPD being seen due to sinus tachycardia and NSVT  Assessment & Plan    1.  Sinus tachycardia, NSVT -Likely being driven by underlying medical illness -Heart rate still elevated, appear improved with additional 60 mg of Cardizem given -Increase Cardizem CD to 180 mg daily.  May increase further depending on heart rate and blood pressure -Monitor and replete electrolytes  2.  History of CAD/CABG -Intolerance to statins, PCSK9 -Continue aspirin 81 mg -Last EF was normal at 55 to 60%      Signed, Debbe Odea, MD  03/26/2020, 2:01 PM

## 2020-03-26 NOTE — Progress Notes (Signed)
[name] to be D/C'd Home per MD order.  Discussed prescriptions and follow up appointments with the patient. Prescriptions electronically submitted medication list explained in detail. Pt verbalized understanding. Home medication returned to patient  Allergies as of 03/26/2020      Reactions   Cefepime Anaphylaxis, Shortness Of Breath, Swelling, Dermatitis   Edoxaban Other (See Comments)   Causes hematomas   Acetaminophen    Will not take after Cyanide deaths, something changed when they mkt'd again.  Can take Childrens Liquid but sometimes upset stomach too   Aspirin    Makes anemic   Beta Adrenergic Blockers    Paradoxical reactions   Fentanyl And Related    fever   Lisinopril    Can only take her own mfr.     Midazolam Hcl    fever   Prednisone    REACTION: ibs probs   Propofol    REACTION: FEVER   Repatha [evolocumab] Other (See Comments)   Knocked her out for 10 days   Statins    GI issues   Sulfamethoxazole-trimethoprim    REACTION: antibiotic induced colitis      Medication List    TAKE these medications   albuterol (2.5 MG/3ML) 0.083% nebulizer solution Commonly known as: PROVENTIL Take 2.5 mg by nebulization every 6 (six) hours as needed for wheezing or shortness of breath.   albuterol 108 (90 Base) MCG/ACT inhaler Commonly known as: VENTOLIN HFA Inhale 2 puffs into the lungs 3 (three) times daily as needed for wheezing or shortness of breath.   amLODipine 5 MG tablet Commonly known as: NORVASC Take 1 tablet (5 mg total) by mouth daily. What changed: when to take this   aspirin 81 MG chewable tablet Chew 81 mg by mouth daily.   clonazePAM 0.5 MG tablet Commonly known as: KLONOPIN Take 1 tablet (0.5 mg total) by mouth 2 (two) times daily as needed for anxiety.   diltiazem 180 MG 24 hr capsule Commonly known as: DILACOR XR Take 1 capsule (180 mg total) by mouth daily.   diphenoxylate-atropine 2.5-0.025 MG tablet Commonly known as:  LOMOTIL Take 1-2 tablets by mouth 3 (three) times daily as needed for diarrhea or loose stools (for bowel spasms).   EPIPEN IJ Inject 0.3 mg as directed as needed (Bee Stings).   famotidine 20 MG tablet Commonly known as: PEPCID Take 1 tablet (20 mg total) by mouth daily.   fluticasone 50 MCG/ACT nasal spray Commonly known as: FLONASE Place 1-2 sprays into both nostrils daily at 6 (six) AM.   hyoscyamine 0.125 MG SL tablet Commonly known as: LEVSIN SL Take 0.125-0.25 mg by mouth every 4 (four) hours as needed for cramping.   pantoprazole 40 MG tablet Commonly known as: PROTONIX Take 40 mg by mouth daily.   promethazine 25 MG suppository Commonly known as: PHENERGAN Place 12.5 mg rectally every 8 (eight) hours as needed for nausea or vomiting.   promethazine 12.5 MG tablet Commonly known as: PHENERGAN Take 1 tablet (12.5 mg total) by mouth every 6 (six) hours as needed.   ranitidine 150 MG tablet Commonly known as: ZANTAC Take 150 mg by mouth daily as needed.   Stiolto Respimat 2.5-2.5 MCG/ACT Aers Generic drug: Tiotropium Bromide-Olodaterol Inhale 2 puffs into the lungs daily.       Vitals:   03/26/20 0846 03/26/20 1132  BP: 139/65 (!) 153/76  Pulse: (!) 117 96  Resp: 17 16  Temp: 98.6 F (37 C) 98.3 F (36.8 C)  SpO2:  90% 90%    Skin clean, dry and intact without evidence of skin break down, no evidence of skin tears noted. IV catheter discontinued intact. Site without signs and symptoms of complications. Dressing and pressure applied. Pt denies pain at this time. No complaints noted.  An After Visit Summary was printed and given to the patient. Patient escorted via [place], and D/C home via private auto.  [place]

## 2020-03-27 LAB — CULTURE, BLOOD (ROUTINE X 2)
Culture: NO GROWTH
Culture: NO GROWTH
Special Requests: ADEQUATE

## 2020-03-27 NOTE — Discharge Summary (Signed)
[place] at [facility]   PATIENT NAME: [name]    MR#:  [id number]  DATE OF BIRTH:  [birth year]  DATE OF ADMISSION:  03/22/2020   ADMITTING PHYSICIAN: Hannah Beat, MD  DATE OF DISCHARGE: 03/26/2020  3:46 PM  PRIMARY CARE PHYSICIAN: Patient, No Pcp Per   ADMISSION DIAGNOSIS:  Asthma exacerbation [J45.901] Sepsis, due to unspecified organism, unspecified whether acute organ dysfunction present (HCC) [A41.9] DISCHARGE DIAGNOSIS:  Principal Problem:   Asthma exacerbation Active Problems:   Hyperlipidemia   Migraine   PERIPHERAL VASCULAR DISEASE   DEGENERATIVE DISC DISEASE, LUMBAR SPINE   Essential hypertension   Coronary artery disease involving native coronary artery of native heart without angina pectoris   Hx of CABG   Anaphylactic reaction   Hypertensive urgency   Paroxysmal tachycardia, unspecified (HCC)  SECONDARY DIAGNOSIS:   Past Medical History:  Diagnosis Date  . Asthma   . Benign essential HTN   . COPD (chronic obstructive pulmonary disease) (HCC)   . IBS (irritable bowel syndrome)   . Migraine headache    HOSPITAL COURSE:  [name], hypertension, [name], who presented to the ED on 03/22/20 with chest tightness with mild dyspnea and wheezing. Reported associated low grade fevers and tachycardia at rest with occasional paroxysmal nocturnal dyspnea.  In the ED, temp initially 82F >> 100F, BP uncontrolled at 206/102, HR 105 to 120's.  Labs notable for K 2.9, glucose 248, BNP 255.4, troponin 31 >> 42, initial lactate 4.3 >> 2.1.  CBC with leukocytosis of 29.5k with neutrophilia and thrombocytosis.  UA showed glucosuria, no infection.  COVID-19 PCR negative.  (Not yet vaccinated).  CXR negative for acute findings.  CT's chest/abdomen/pelvis showed severe calcification of the aortic arch with no other acute chest abnormalities, small to moderate sized  hiatal hernia and noninflamed diverticula throughout the large bowel and hepatic small cyst or hemangioma, grade 1 anterolisthesis of L4 on L5, aortic atherosclerosis and mild to moderate severity multilevel degenerative changes throughout the lumbar spine most prominent at L1-L2.  Patient was treated with Cefepime in the ED, subsequently developed anaphylactic reaction for which IM epinephrine, IV Benadryl, IV Pepcid and 125 mg Solu-Medrol were given.  Small LR bolus and IV Vancomycin, and albuterol neb was given.  Patient admitted to [facility] on hospitalist service for further evaluation and management.  Sepsis secondary to acute bronchitis with secondary Asthma and COPD acute exacerbation Patient presented with fever, leukocytosis and lactic acidosis.  Has not been hypoxic. --treated with Nebs, empiric Abx and steroids with good improvement  Sinus Tachycardia - POA, improved with cardizem.  - CT Angio neg for PE on 6/8  Elevated BNP - POA with BNP 255.3. due to diastolic CHF.  History provided indicates possible PND/orthopnea. --Echo shows grade 1 diastolic CHF --diuresed with Lasix  Hypertensive urgency - POA.  Improved. Essential Hypertension - continue home amlodipine, cardizem  Anaphylaxis secondary to cefepime/cephalosporins - occurred in the ED, resolved with treatment  Hypokalemia - repleted and resolved  Type 2 diabetes mellitus, uncontrolled - A1c 7.9%.  Tobacco abuse - Smoking cessation counseling.  PVD - continue ASA.  Overweight, near obesity: Body mass index is 25.47 kg/m. encouraged weight loss   DISCHARGE CONDITIONS:  stable CONSULTS OBTAINED:  Treatment Team:  Vida Rigger, MD Iran Ouch, MD DRUG ALLERGIES:   Allergies  Allergen Reactions  . Cefepime Anaphylaxis, Shortness Of Breath, Swelling and Dermatitis  . Edoxaban  Other (See Comments)    Causes hematomas  . Acetaminophen     Will not take after Cyanide deaths, something changed when  they mkt'd again.  Can take Childrens Liquid but sometimes upset stomach too  . Aspirin     Makes anemic  . Beta Adrenergic Blockers     Paradoxical reactions  . Fentanyl And Related     fever  . Lisinopril     Can only take her own mfr.    . Midazolam Hcl     fever  . Prednisone     REACTION: ibs probs  . Propofol     REACTION: FEVER  . Repatha [Evolocumab] Other (See Comments)    Knocked her out for 10 days  . Statins     GI issues  . Sulfamethoxazole-Trimethoprim     REACTION: antibiotic induced colitis   DISCHARGE MEDICATIONS:   Allergies as of 03/26/2020      Reactions   Cefepime Anaphylaxis, Shortness Of Breath, Swelling, Dermatitis   Edoxaban Other (See Comments)   Causes hematomas   Acetaminophen    Will not take after Cyanide deaths, something changed when they mkt'd again.  Can take Childrens Liquid but sometimes upset stomach too   Aspirin    Makes anemic   Beta Adrenergic Blockers    Paradoxical reactions   Fentanyl And Related    fever   Lisinopril    Can only take her own mfr.     Midazolam Hcl    fever   Prednisone    REACTION: ibs probs   Propofol    REACTION: FEVER   Repatha [evolocumab] Other (See Comments)   Knocked her out for 10 days   Statins    GI issues   Sulfamethoxazole-trimethoprim    REACTION: antibiotic induced colitis      Medication List    TAKE these medications   albuterol (2.5 MG/3ML) 0.083% nebulizer solution Commonly known as: PROVENTIL Take 2.5 mg by nebulization every 6 (six) hours as needed for wheezing or shortness of breath.   albuterol 108 (90 Base) MCG/ACT inhaler Commonly known as: VENTOLIN HFA Inhale 2 puffs into the lungs 3 (three) times daily as needed for wheezing or shortness of breath.   amLODipine 5 MG tablet Commonly known as: NORVASC Take 1 tablet (5 mg total) by mouth daily. What changed: when to take this   aspirin 81 MG chewable tablet Chew 81 mg by mouth daily.   clonazePAM 0.5 MG  tablet Commonly known as: KLONOPIN Take 1 tablet (0.5 mg total) by mouth 2 (two) times daily as needed for anxiety.   diltiazem 180 MG 24 hr capsule Commonly known as: DILACOR XR Take 1 capsule (180 mg total) by mouth daily.   diphenoxylate-atropine 2.5-0.025 MG tablet Commonly known as: LOMOTIL Take 1-2 tablets by mouth 3 (three) times daily as needed for diarrhea or loose stools (for bowel spasms).   EPIPEN IJ Inject 0.3 mg as directed as needed (Bee Stings).   famotidine 20 MG tablet Commonly known as: PEPCID Take 1 tablet (20 mg total) by mouth daily.   fluticasone 50 MCG/ACT nasal spray Commonly known as: FLONASE Place 1-2 sprays into both nostrils daily at 6 (six) AM.   hyoscyamine 0.125 MG SL tablet Commonly known as: LEVSIN SL Take 0.125-0.25 mg by mouth every 4 (four) hours as needed for cramping.   pantoprazole 40 MG tablet Commonly known as: PROTONIX Take 40 mg by mouth daily.   promethazine 25 MG suppository Commonly  known as: PHENERGAN Place 12.5 mg rectally every 8 (eight) hours as needed for nausea or vomiting.   promethazine 12.5 MG tablet Commonly known as: PHENERGAN Take 1 tablet (12.5 mg total) by mouth every 6 (six) hours as needed.   ranitidine 150 MG tablet Commonly known as: ZANTAC Take 150 mg by mouth daily as needed.   Stiolto Respimat 2.5-2.5 MCG/ACT Aers Generic drug: Tiotropium Bromide-Olodaterol Inhale 2 puffs into the lungs daily.      DISCHARGE INSTRUCTIONS:   DIET:  Cardiac diet DISCHARGE CONDITION:  Stable ACTIVITY:  Activity as tolerated OXYGEN:  Home Oxygen: No.  Oxygen Delivery: room air DISCHARGE LOCATION:  home   If you experience worsening of your admission symptoms, develop shortness of breath, life threatening emergency, suicidal or homicidal thoughts you must seek medical attention immediately by calling 911 or calling your MD immediately  if symptoms less severe.  You Must read complete instructions/literature  along with all the possible adverse reactions/side effects for all the Medicines you take and that have been prescribed to you. Take any new Medicines after you have completely understood and accpet all the possible adverse reactions/side effects.   Please note  You were cared for by a hospitalist during your hospital stay. If you have any questions about your discharge medications or the care you received while you were in the hospital after you are discharged, you can call the unit and asked to speak with the hospitalist on call if the hospitalist that took care of you is not available. Once you are discharged, your primary care physician will handle any further medical issues. Please note that NO REFILLS for any discharge medications will be authorized once you are discharged, as it is imperative that you return to your primary care physician (or establish a relationship with a primary care physician if you do not have one) for your aftercare needs so that they can reassess your need for medications and monitor your lab values.    On the day of Discharge:  VITAL SIGNS:  Blood pressure (!) 153/76, pulse 96, temperature 98.3 F (36.8 C), temperature source Oral, resp. rate 16, height 5\' 3"  (1.6 m), weight 65.2 kg, SpO2 90 %. PHYSICAL EXAMINATION:  GENERAL:  64 y.o.-year-old patient lying in the bed with no acute distress.  EYES: Pupils equal, round, reactive to light and accommodation. No scleral icterus. Extraocular muscles intact.  HEENT: Head atraumatic, normocephalic. Oropharynx and nasopharynx clear.  NECK:  Supple, no jugular venous distention. No thyroid enlargement, no tenderness.  LUNGS: Normal breath sounds bilaterally, no wheezing, rales,rhonchi or crepitation. No use of accessory muscles of respiration.  CARDIOVASCULAR: S1, S2 normal. No murmurs, rubs, or gallops.  ABDOMEN: Soft, non-tender, non-distended. Bowel sounds present. No organomegaly or mass.  EXTREMITIES: No pedal edema,  cyanosis, or clubbing.  NEUROLOGIC: Cranial nerves II through XII are intact. Muscle strength 5/5 in all extremities. Sensation intact. Gait not checked.  PSYCHIATRIC: The patient is alert and oriented x 3.  SKIN: No obvious rash, lesion, or ulcer.  DATA REVIEW:   CBC Recent Labs  Lab 03/26/20 0531  WBC 28.1*  HGB 15.6*  HCT 45.9  PLT 442*    Chemistries  Recent Labs  Lab 03/22/20 1345 03/23/20 0527 03/24/20 0454 03/25/20 0423 03/26/20 0531  NA 136   < > 132*   < > 131*  K 2.9*   < > 3.7   < > 3.6  CL 97*   < > 86*   < >  86*  CO2 24   < > 29   < > 30  GLUCOSE 248*   < > 285*   < > 183*  BUN 10   < > 19   < > 23  CREATININE 0.86   < > 1.15*   < > 1.04*  CALCIUM 9.5   < > 9.4   < > 8.8*  MG  --    < > 2.9*  --   --   AST 40  --   --   --   --   ALT 18  --   --   --   --   ALKPHOS 121  --   --   --   --   BILITOT 0.6  --   --   --   --    < > = values in this interval not displayed.     Outpatient follow-up  Follow-up Information    Go to [facility]   Why: Appointment scheduled for July 1st at 10:00. Facility will call the day before to confirm appointment. Confirmation must be made or facility will cancel appointment. Contact information: [address] [place] [address] [phone]        Schedule an appointment as soon as possible for a visit with Iran Ouch, MD.   Specialty: Cardiology Contact information: [address] [place] [address] [phone]        Vida Rigger, MD. Schedule an appointment as soon as possible for a visit in 1 week.   Specialty: Pulmonary Disease Contact information: [address] [place] [address] [phone]        Schedule an appointment as soon as possible for a visit with [place].   Contact information: [address] [place] [address] [phone]                Management plans discussed with the patient, family  and they are in agreement.  CODE STATUS: Prior   TOTAL TIME TAKING CARE OF THIS PATIENT: 45 minutes.    Delfino Lovett M.D on 03/27/2020 at 8:38 PM  [facility]   CC: Primary care physician; Patient, No Pcp Per   Note: This dictation was prepared with Dragon dictation along with smaller phrase technology. Any transcriptional errors that result from this process are unintentional.

## 2020-03-29 ENCOUNTER — Inpatient Hospital Stay
Admission: AD | Admit: 2020-03-29 | Discharge: 2020-03-31 | DRG: 392 | Disposition: A | Discharge status: Home / Self Care | Payer: 59 | Attending: Hospitalist | Admitting: Hospitalist

## 2020-03-29 ENCOUNTER — Other Ambulatory Visit: Payer: Self-pay

## 2020-03-29 ENCOUNTER — Emergency Department: Payer: 59

## 2020-03-29 DIAGNOSIS — Z8249 Family history of ischemic heart disease and other diseases of the circulatory system: Secondary | ICD-10-CM

## 2020-03-29 DIAGNOSIS — Z20822 Contact with and (suspected) exposure to covid-19: Secondary | ICD-10-CM | POA: Diagnosis present

## 2020-03-29 DIAGNOSIS — X58XXXA Exposure to other specified factors, initial encounter: Secondary | ICD-10-CM | POA: Diagnosis present

## 2020-03-29 DIAGNOSIS — Z885 Allergy status to narcotic agent status: Secondary | ICD-10-CM

## 2020-03-29 DIAGNOSIS — E878 Other disorders of electrolyte and fluid balance, not elsewhere classified: Secondary | ICD-10-CM | POA: Diagnosis present

## 2020-03-29 DIAGNOSIS — Z87892 Personal history of anaphylaxis: Secondary | ICD-10-CM

## 2020-03-29 DIAGNOSIS — Z87891 Personal history of nicotine dependence: Secondary | ICD-10-CM

## 2020-03-29 DIAGNOSIS — Z7982 Long term (current) use of aspirin: Secondary | ICD-10-CM

## 2020-03-29 DIAGNOSIS — R197 Diarrhea, unspecified: Secondary | ICD-10-CM

## 2020-03-29 DIAGNOSIS — J449 Chronic obstructive pulmonary disease, unspecified: Secondary | ICD-10-CM

## 2020-03-29 DIAGNOSIS — S32029A Unspecified fracture of second lumbar vertebra, initial encounter for closed fracture: Secondary | ICD-10-CM | POA: Diagnosis present

## 2020-03-29 DIAGNOSIS — G43909 Migraine, unspecified, not intractable, without status migrainosus: Secondary | ICD-10-CM | POA: Diagnosis present

## 2020-03-29 DIAGNOSIS — J4489 Other specified chronic obstructive pulmonary disease: Secondary | ICD-10-CM

## 2020-03-29 DIAGNOSIS — R112 Nausea with vomiting, unspecified: Secondary | ICD-10-CM | POA: Diagnosis not present

## 2020-03-29 DIAGNOSIS — I2510 Atherosclerotic heart disease of native coronary artery without angina pectoris: Secondary | ICD-10-CM | POA: Diagnosis present

## 2020-03-29 DIAGNOSIS — Z951 Presence of aortocoronary bypass graft: Secondary | ICD-10-CM

## 2020-03-29 DIAGNOSIS — Z808 Family history of malignant neoplasm of other organs or systems: Secondary | ICD-10-CM

## 2020-03-29 DIAGNOSIS — I25110 Atherosclerotic heart disease of native coronary artery with unstable angina pectoris: Secondary | ICD-10-CM | POA: Diagnosis present

## 2020-03-29 DIAGNOSIS — K5732 Diverticulitis of large intestine without perforation or abscess without bleeding: Secondary | ICD-10-CM | POA: Diagnosis not present

## 2020-03-29 DIAGNOSIS — E871 Hypo-osmolality and hyponatremia: Secondary | ICD-10-CM | POA: Diagnosis present

## 2020-03-29 DIAGNOSIS — Z881 Allergy status to other antibiotic agents status: Secondary | ICD-10-CM

## 2020-03-29 DIAGNOSIS — Z79899 Other long term (current) drug therapy: Secondary | ICD-10-CM

## 2020-03-29 DIAGNOSIS — Z888 Allergy status to other drugs, medicaments and biological substances status: Secondary | ICD-10-CM

## 2020-03-29 DIAGNOSIS — K5792 Diverticulitis of intestine, part unspecified, without perforation or abscess without bleeding: Secondary | ICD-10-CM

## 2020-03-29 DIAGNOSIS — I10 Essential (primary) hypertension: Secondary | ICD-10-CM | POA: Diagnosis present

## 2020-03-29 DIAGNOSIS — A419 Sepsis, unspecified organism: Secondary | ICD-10-CM

## 2020-03-29 DIAGNOSIS — K5733 Diverticulitis of large intestine without perforation or abscess with bleeding: Secondary | ICD-10-CM

## 2020-03-29 DIAGNOSIS — G894 Chronic pain syndrome: Secondary | ICD-10-CM | POA: Diagnosis present

## 2020-03-29 LAB — LIPASE, BLOOD: Lipase: 20 U/L (ref 11–51)

## 2020-03-29 LAB — CBC
HCT: 42.7 % (ref 36.0–46.0)
Hemoglobin: 14.4 g/dL (ref 12.0–15.0)
MCH: 28.7 pg (ref 26.0–34.0)
MCHC: 33.7 g/dL (ref 30.0–36.0)
MCV: 85.1 fL (ref 80.0–100.0)
Platelets: 402 10*3/uL — ABNORMAL HIGH (ref 150–400)
RBC: 5.02 MIL/uL (ref 3.87–5.11)
RDW: 13.1 % (ref 11.5–15.5)
WBC: 40.6 10*3/uL — ABNORMAL HIGH (ref 4.0–10.5)
nRBC: 0 % (ref 0.0–0.2)

## 2020-03-29 LAB — COMPREHENSIVE METABOLIC PANEL
ALT: 21 U/L (ref 0–44)
AST: 27 U/L (ref 15–41)
Albumin: 3.7 g/dL (ref 3.5–5.0)
Alkaline Phosphatase: 98 U/L (ref 38–126)
Anion gap: 15 (ref 5–15)
BUN: 10 mg/dL (ref 8–23)
CO2: 27 mmol/L (ref 22–32)
Calcium: 8.9 mg/dL (ref 8.9–10.3)
Chloride: 89 mmol/L — ABNORMAL LOW (ref 98–111)
Creatinine, Ser: 1.01 mg/dL — ABNORMAL HIGH (ref 0.44–1.00)
GFR calc Af Amer: >60 mL/min (ref >60)
GFR calc non Af Amer: 60 mL/min — ABNORMAL LOW (ref >60)
Glucose, Bld: 419 mg/dL — ABNORMAL HIGH (ref 70–99)
Potassium: 3.4 mmol/L — ABNORMAL LOW (ref 3.5–5.1)
Sodium: 131 mmol/L — ABNORMAL LOW (ref 135–145)
Total Bilirubin: 1 mg/dL (ref 0.3–1.2)
Total Protein: 6.8 g/dL (ref 6.5–8.1)

## 2020-03-29 MED ORDER — ONDANSETRON 4 MG PO TBDP: 4.0000 mg | ORAL_TABLET | Freq: Once | ORAL | Status: DC | PRN

## 2020-03-29 MED ORDER — SODIUM CHLORIDE 0.9 % IV BOLUS
1000.0000 mL | Freq: Once | INTRAVENOUS | Status: AC
  Administered 2020-03-29: 1000 mL via INTRAVENOUS

## 2020-03-29 MED ORDER — IOHEXOL 300 MG/ML  SOLN
100.0000 mL | Freq: Once | INTRAMUSCULAR | Status: AC | PRN
  Administered 2020-03-29: 100 mL via INTRAVENOUS

## 2020-03-29 MED ORDER — PROMETHAZINE HCL 25 MG/ML IJ SOLN
6.2500 mg | Freq: Once | INTRAMUSCULAR | Status: AC
  Administered 2020-03-29: 6.25 mg via INTRAVENOUS
  Filled 2020-03-29: qty 1

## 2020-03-29 NOTE — ED Notes (Signed)
Pt to CT at this time.

## 2020-03-29 NOTE — ED Provider Notes (Signed)
[facility] Emergency Department Provider Note  ____________________________________________   First MD Initiated Contact with Patient 03/29/20 2213     (approximate)  I have reviewed the triage vital signs and the nursing notes.  History  Chief Complaint Diarrhea and Emesis    HPI [name] is a 64 y.o. female who presents for severe generalized abdominal pain and cramping, diarrhea, nausea w/ vomiting, and back pain. Patient states n/v/d first started a few days ago.  Reports diffuse, generalized abdominal cramping.  Pain is severe.  No radiation.  No alleviating/aggravating components.  Has had innumerable episodes of diarrhea, extremely foul-smelling and watery.  States symptoms are similar to when she has been diagnosed with C. difficile in the past.  Has had C. difficile twice before precipitated by antibiotics.  Also reports associated nausea and nonbloody, nonbilious emesis, which has now progressed to dry heaves as she has been unable to tolerate anything significant by mouth.  Of note, patient did have a recent antibiotic exposure - was recently admitted here from 6/5-6/9 for sepsis of unclear etiology and received antibiotics.  Notably, she received a dose of cefepime and had an anaphylactic reaction to this.  She also reports low back pain since being discharged.  Denies any trauma.  Denies any dysuria, hematuria, urinary frequency.   Past Medical Hx Past Medical History:  Diagnosis Date  . Asthma   . Benign essential HTN   . COPD (chronic obstructive pulmonary disease) ([place])   . IBS (irritable bowel syndrome)   . Migraine headache     Problem List Patient Active Problem List   Diagnosis Date Noted  . Paroxysmal tachycardia, unspecified ([place])   . Anaphylactic reaction 03/23/2020  . Hypertensive urgency 03/23/2020  . Asthma exacerbation 03/22/2020  . 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 Renal artery stenosis, non-flow-limiting ([place]) 06/14/2016    . Coronary artery disease involving native coronary artery of native heart without angina pectoris 04/29/2016  . Palpitations 04/22/2016  . Overweight (BMI 25.0-29.9) 01/22/2015  . COPD exacerbation ([place]) 01/19/2015  . Acute respiratory failure with hypoxia ([place]) 01/19/2015  . 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 Essential hypertension 01/19/2015  . Chronic pain syndrome 01/19/2015  . ACUTE CYSTITIS 04/30/2010  . GASTROINTESTINAL XRAY, ABNORMAL 01/05/2010  . GERD 12/25/2009  . BREAST MASS 10/24/2009  . ANXIETY DEPRESSION 09/17/2009  . WEIGHT LOSS 09/17/2009  . ABDOMINAL PAIN -GENERALIZED 09/03/2009  . CLOSTRIDIUM DIFFICILE COLITIS 06/16/2009  . MICROSCOPIC HEMATURIA 06/09/2009  . BACK PAIN, LUMBAR 06/09/2009  . PERIPHERAL NEUROPATHY 05/28/2009  . CONTUSION, HIP, RIGHT 05/28/2009  . SEBACEOUS CYST 02/13/2009  . BACK PAIN, CHRONIC 02/13/2009  . DYSPLASTIC NEVUS, BACK 09/18/2008  . ANXIETY 08/28/2008  . 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 OTHER DYSPHAGIA 10/30/2007  . IRRITABLE BOWEL SYNDROME 09/29/2007  . TOBACCO ABUSE 07/17/2007  . Migraine 07/17/2007  . DISEASE, HYPERTENSIVE HEART NOS, W/O HF 05/25/2007  . Hyperlipidemia 03/22/2007  . PERIPHERAL VASCULAR DISEASE 03/22/2007  . ASTHMA 03/22/2007  . DIVERTICULOSIS, COLON 03/22/2007    Past Surgical Hx History reviewed. No pertinent surgical history.  Medications Prior to Admission medications   Medication Sig Start Date End Date Taking? Authorizing Provider  albuterol (PROVENTIL HFA;VENTOLIN HFA) 108 (90 BASE) MCG/ACT inhaler Inhale 2 puffs into the lungs 3 (three) times daily as needed for wheezing or shortness of breath.    [provider]  albuterol (PROVENTIL) (2.5 MG/3ML) 0.083% nebulizer solution Take 2.5 mg by  nebulization every 6 (six) hours as needed for wheezing or shortness of breath.    [provider]  amLODipine (NORVASC) 5 MG  tablet Take 1 tablet (5 mg total) by mouth daily. Patient taking differently: Take 5 mg by mouth 2 (two) times daily.  02/21/20 02/20/21  Willy Eddy, MD  aspirin 81 MG chewable tablet Chew 81 mg by mouth daily. 05/04/16   [provider]  clonazePAM (KLONOPIN) 0.5 MG tablet Take 1 tablet (0.5 mg total) by mouth 2 (two) times daily as needed for anxiety. 03/25/20   Delfino Lovett, MD  diltiazem (DILACOR XR) 180 MG 24 hr capsule Take 1 capsule (180 mg total) by mouth daily. 03/26/20 04/25/20  Delfino Lovett, MD  diphenoxylate-atropine (LOMOTIL) 2.5-0.025 MG per tablet Take 1-2 tablets by mouth 3 (three) times daily as needed for diarrhea or loose stools (for bowel spasms).    [provider]  EPINEPHrine (EPIPEN IJ) Inject 0.3 mg as directed as needed ([place]ee Stings).    [provider]  famotidine (PEPCID) 20 MG tablet Take 1 tablet (20 mg total) by mouth daily. 03/26/20   Delfino Lovett, MD  fluticasone (FLONASE) 50 MCG/ACT nasal spray Place 1-2 sprays into both nostrils daily at 6 (six) AM. 03/21/20   [provider]  hyoscyamine (LEVSIN SL) 0.125 MG SL tablet Take 0.125-0.25 mg by mouth every 4 (four) hours as needed for cramping.    [provider]  pantoprazole (PROTONIX) 40 MG tablet Take 40 mg by mouth daily. 02/12/20   [provider]  promethazine (PHENERGAN) 12.5 MG tablet Take 1 tablet (12.5 mg total) by mouth every 6 (six) hours as needed. 02/21/20   Willy Eddy, MD  promethazine (PHENERGAN) 25 MG suppository Place 12.5 mg rectally every 8 (eight) hours as needed for nausea or vomiting.    [provider]  ranitidine (ZANTAC) 150 MG tablet Take 150 mg by mouth daily as needed. 05/05/17   [provider]  Tiotropium Bromide-Olodaterol (STIOLTO RESPIMAT) 2.5-2.5 MCG/ACT AERS Inhale 2 puffs into the lungs daily. 02/07/17   [provider]    Allergies Cefepime, Edoxaban, Acetaminophen, Aspirin, Beta adrenergic blockers, Fentanyl  and related, Lisinopril, Midazolam hcl, Prednisone, Propofol, Repatha [evolocumab], Statins, and Sulfamethoxazole-trimethoprim  Family Hx Family History  Problem Relation Age of Onset  . Hypertension Mother   . CAD Brother        Deceased on MI age 85  . Cancer - Other Father        Oropharyngeal SCCa    Social Hx Social History   Tobacco Use  . Smoking status: Heavy Tobacco Smoker    Packs/day: 1.00    Types: Cigarettes  . Smokeless tobacco: Never Used  Vaping Use  . Vaping Use: Never used  Substance Use Topics  . Alcohol use: Yes    Alcohol/week: 0.0 standard drinks    Comment: Occasional to Rare  . Drug use: No     Review of Systems  Constitutional: Negative for fever. Negative for chills. Eyes: Negative for visual changes. ENT: Negative for sore throat. Cardiovascular: Negative for chest pain. Respiratory: Negative for shortness of breath. Gastrointestinal: + nausea, vomiting, diarrhea, abdominal pain Genitourinary: Negative for dysuria. Musculoskeletal: Negative for leg swelling. + low back pain Skin: Negative for rash. Neurological: Negative for headaches.   Physical Exam  Vital Signs: ED Triage Vitals [03/29/20 2117]  Enc Vitals Group     BP (!) 181/91     Pulse Rate (!) 108  Resp 18     Temp 98.2 F (36.8 C)     Temp Source Oral     SpO2 98 %     Weight 143 lb 4.8 oz (65 kg)     Height 5\' 3"  (1.6 m)     Head Circumference      Peak Flow      Pain Score 10     Pain Loc      Pain Edu?      Excl. in GC?     Constitutional: Alert and oriented.  Appears fatigued. Head: Normocephalic. Atraumatic. Eyes: Conjunctivae clear. Sclera anicteric. Pupils equal and symmetric. Nose: No masses or lesions. No congestion or rhinorrhea. Mouth/Throat: MM dry. .  Neck: No stridor. Trachea midline.  Cardiovascular: Cardiac, regular rhythm. Extremities well perfused. Respiratory: Normal respiratory effort.  Lungs CTAB. Gastrointestinal: Soft.  Mild,  generalized tenderness to palpation throughout.  No rebound/guarding/rigidity. Genitourinary: Deferred. Musculoskeletal: No lower extremity edema. No deformities. Neurologic:  Normal speech and language. No gross focal or lateralizing neurologic deficits are appreciated.  Skin: Skin is warm, dry and intact. No rash noted. Psychiatric: Mood and affect are appropriate for situation.  EKG  Personally reviewed and interpreted by myself.   Date: 03/29/2020 Time: 2127 Rate: 106 Rhythm: sinus Axis: borderline leftward Intervals: WNL Sinus tachycardia No STEMI    Radiology  CT A/P ordered, pending at time of signout   Procedures  Procedure(s) performed (including critical care):  .1-3 Lead EKG Interpretation Performed by: [id number]., MD Authorized by: Miguel Aschoff., MD     ECG rate assessment: tachycardic     Rhythm: sinus rhythm   Comments:     Indication: Nausea, vomiting, diarrhea, concern for dehydration Impression: Sinus tachycardia     Initial Impression / Assessment and Plan / MDM / ED Course  64 y.o. female who presents to the ED for nausea, vomiting, generalized abdominal pain, profuse diarrhea for the last several days.  Symptoms similar to prior episodes of C. difficile.  Recent antibiotic exposure.  Also with low back pain.  Ddx: C. difficile, colitis, gastroenteritis, pyelonephritis  Will plan for labs, stool sample, imaging, fluids, symptom control  Labs thus far reveal impressive leukocytosis to 40, suggestive/in line with concern for C. difficile.  Hyponatremia, hypochloremia consistent with fluid losses.  Receiving IV fluids.  Awaiting imaging and stool sample.  Patient care turned over to oncoming provider due to shift change, awaiting these results.  Anticipate admission.   _______________________________   As part of my medical decision making I have reviewed available labs, radiology tests, reviewed old records/performed chart review.      Final Clinical Impression(s) / ED Diagnosis  Nausea, vomiting, diarrhea   Note:  This document was prepared using Dragon voice recognition software and may include unintentional dictation errors.   68., MD 03/30/20 04/01/20

## 2020-03-29 NOTE — ED Notes (Signed)
Iv team at bedside  

## 2020-03-29 NOTE — ED Triage Notes (Signed)
Pt comes POV after sepsis admission Wednesday with vomiting and diarrhea and lower back pain. Pt states that she thinks it's c-diff. Pt has had it x2 before. Pt tried x2 doses of imodium with minor relief.

## 2020-03-29 NOTE — ED Notes (Signed)
Patient observed laying self on floor.

## 2020-03-29 NOTE — ED Notes (Signed)
Pt educated on need of urine and stool sample. States she will hit call light when she is able to provide sample but is unable at this time

## 2020-03-29 NOTE — ED Notes (Signed)
This nurse has x1 unsuccessful IV start. Pt was also stuck x 2 in triage.

## 2020-03-30 DIAGNOSIS — A419 Sepsis, unspecified organism: Secondary | ICD-10-CM

## 2020-03-30 DIAGNOSIS — I10 Essential (primary) hypertension: Secondary | ICD-10-CM | POA: Diagnosis present

## 2020-03-30 DIAGNOSIS — G43909 Migraine, unspecified, not intractable, without status migrainosus: Secondary | ICD-10-CM | POA: Diagnosis present

## 2020-03-30 DIAGNOSIS — G894 Chronic pain syndrome: Secondary | ICD-10-CM | POA: Diagnosis present

## 2020-03-30 DIAGNOSIS — Z881 Allergy status to other antibiotic agents status: Secondary | ICD-10-CM | POA: Diagnosis not present

## 2020-03-30 DIAGNOSIS — E878 Other disorders of electrolyte and fluid balance, not elsewhere classified: Secondary | ICD-10-CM | POA: Diagnosis present

## 2020-03-30 DIAGNOSIS — Z888 Allergy status to other drugs, medicaments and biological substances status: Secondary | ICD-10-CM | POA: Diagnosis not present

## 2020-03-30 DIAGNOSIS — X58XXXA Exposure to other specified factors, initial encounter: Secondary | ICD-10-CM | POA: Diagnosis present

## 2020-03-30 DIAGNOSIS — K5732 Diverticulitis of large intestine without perforation or abscess without bleeding: Secondary | ICD-10-CM | POA: Diagnosis present

## 2020-03-30 DIAGNOSIS — I2510 Atherosclerotic heart disease of native coronary artery without angina pectoris: Secondary | ICD-10-CM | POA: Diagnosis present

## 2020-03-30 DIAGNOSIS — E871 Hypo-osmolality and hyponatremia: Secondary | ICD-10-CM | POA: Diagnosis present

## 2020-03-30 DIAGNOSIS — R112 Nausea with vomiting, unspecified: Secondary | ICD-10-CM | POA: Diagnosis present

## 2020-03-30 DIAGNOSIS — J449 Chronic obstructive pulmonary disease, unspecified: Secondary | ICD-10-CM | POA: Diagnosis present

## 2020-03-30 DIAGNOSIS — K5792 Diverticulitis of intestine, part unspecified, without perforation or abscess without bleeding: Secondary | ICD-10-CM | POA: Diagnosis present

## 2020-03-30 DIAGNOSIS — Z951 Presence of aortocoronary bypass graft: Secondary | ICD-10-CM | POA: Diagnosis not present

## 2020-03-30 DIAGNOSIS — Z87892 Personal history of anaphylaxis: Secondary | ICD-10-CM | POA: Diagnosis not present

## 2020-03-30 DIAGNOSIS — Z87891 Personal history of nicotine dependence: Secondary | ICD-10-CM | POA: Diagnosis not present

## 2020-03-30 DIAGNOSIS — Z808 Family history of malignant neoplasm of other organs or systems: Secondary | ICD-10-CM | POA: Diagnosis not present

## 2020-03-30 DIAGNOSIS — Z79899 Other long term (current) drug therapy: Secondary | ICD-10-CM | POA: Diagnosis not present

## 2020-03-30 DIAGNOSIS — Z8249 Family history of ischemic heart disease and other diseases of the circulatory system: Secondary | ICD-10-CM | POA: Diagnosis not present

## 2020-03-30 DIAGNOSIS — Z885 Allergy status to narcotic agent status: Secondary | ICD-10-CM | POA: Diagnosis not present

## 2020-03-30 DIAGNOSIS — Z20822 Contact with and (suspected) exposure to covid-19: Secondary | ICD-10-CM | POA: Diagnosis present

## 2020-03-30 DIAGNOSIS — Z7982 Long term (current) use of aspirin: Secondary | ICD-10-CM | POA: Diagnosis not present

## 2020-03-30 DIAGNOSIS — J4489 Other specified chronic obstructive pulmonary disease: Secondary | ICD-10-CM

## 2020-03-30 DIAGNOSIS — S32029A Unspecified fracture of second lumbar vertebra, initial encounter for closed fracture: Secondary | ICD-10-CM | POA: Diagnosis present

## 2020-03-30 HISTORY — DX: Diverticulitis of intestine, part unspecified, without perforation or abscess without bleeding: K57.92

## 2020-03-30 LAB — URINALYSIS, COMPLETE (UACMP) WITH MICROSCOPIC
Bacteria, UA: NONE SEEN
Bilirubin Urine: NEGATIVE
Glucose, UA: >=500 mg/dL — AB
Hgb urine dipstick: NEGATIVE
Ketones, ur: NEGATIVE mg/dL
Leukocytes,Ua: NEGATIVE
Nitrite: NEGATIVE
Protein, ur: NEGATIVE mg/dL
Specific Gravity, Urine: 1.03 (ref 1.005–1.030)
pH: 7 (ref 5.0–8.0)

## 2020-03-30 LAB — GLUCOSE, CAPILLARY
Glucose-Capillary: 145 mg/dL — ABNORMAL HIGH (ref 70–99)
Glucose-Capillary: 128 mg/dL — ABNORMAL HIGH (ref 70–99)
Glucose-Capillary: 208 mg/dL — ABNORMAL HIGH (ref 70–99)
Glucose-Capillary: 105 mg/dL — ABNORMAL HIGH (ref 70–99)

## 2020-03-30 LAB — PROTIME-INR
INR: 1 (ref 0.8–1.2)
Prothrombin Time: 13.1 seconds (ref 11.4–15.2)

## 2020-03-30 LAB — SARS CORONAVIRUS 2 BY RT PCR (HOSPITAL ORDER, PERFORMED IN ~~LOC~~ HOSPITAL LAB): SARS Coronavirus 2: NEGATIVE

## 2020-03-30 LAB — PROCALCITONIN: Procalcitonin: 0.28 ng/mL

## 2020-03-30 LAB — CORTISOL-AM, BLOOD: Cortisol - AM: 15.8 ug/dL (ref 6.7–22.6)

## 2020-03-30 MED ORDER — METRONIDAZOLE IN NACL 5-0.79 MG/ML-% IV SOLN
500.0000 mg | Freq: Three times a day (TID) | INTRAVENOUS | Status: DC
  Administered 2020-03-30 – 2020-03-31 (×3): 500 mg via INTRAVENOUS
  Filled 2020-03-30 (×6): qty 100

## 2020-03-30 MED ORDER — SODIUM CHLORIDE 0.9 % IV SOLN: INTRAVENOUS | Status: DC

## 2020-03-30 MED ORDER — CIPROFLOXACIN IN D5W 400 MG/200ML IV SOLN
400.0000 mg | Freq: Once | INTRAVENOUS | Status: AC
  Administered 2020-03-30: 400 mg via INTRAVENOUS
  Filled 2020-03-30: qty 200

## 2020-03-30 MED ORDER — ONDANSETRON HCL 4 MG/2ML IJ SOLN
4.0000 mg | Freq: Once | INTRAMUSCULAR | Status: AC
  Administered 2020-03-30: 4 mg via INTRAVENOUS
  Filled 2020-03-30: qty 2

## 2020-03-30 MED ORDER — ENOXAPARIN SODIUM 40 MG/0.4ML ~~LOC~~ SOLN
40.0000 mg | SUBCUTANEOUS | Status: DC
  Administered 2020-03-31: 40 mg via SUBCUTANEOUS
  Filled 2020-03-30: qty 0.4

## 2020-03-30 MED ORDER — DILTIAZEM HCL ER COATED BEADS 180 MG PO CP24: 180.0000 mg | ORAL_CAPSULE | Freq: Every day | ORAL | Status: DC

## 2020-03-30 MED ORDER — INSULIN ASPART 100 UNIT/ML ~~LOC~~ SOLN
0.0000 [IU] | Freq: Three times a day (TID) | SUBCUTANEOUS | Status: DC
  Administered 2020-03-30 (×3): 2 [IU] via SUBCUTANEOUS
  Administered 2020-03-31: 5 [IU] via SUBCUTANEOUS
  Filled 2020-03-30 (×4): qty 1

## 2020-03-30 MED ORDER — MORPHINE SULFATE (PF) 4 MG/ML IV SOLN
4.0000 mg | Freq: Once | INTRAVENOUS | Status: AC
  Administered 2020-03-30: 4 mg via INTRAVENOUS
  Filled 2020-03-30: qty 1

## 2020-03-30 MED ORDER — AMLODIPINE BESYLATE 5 MG PO TABS
5.0000 mg | ORAL_TABLET | Freq: Two times a day (BID) | ORAL | Status: DC
  Administered 2020-03-30 – 2020-03-31 (×2): 5 mg via ORAL
  Filled 2020-03-30 (×2): qty 1

## 2020-03-30 MED ORDER — METRONIDAZOLE IN NACL 5-0.79 MG/ML-% IV SOLN
500.0000 mg | Freq: Once | INTRAVENOUS | Status: AC
  Administered 2020-03-30: 500 mg via INTRAVENOUS
  Filled 2020-03-30: qty 100

## 2020-03-30 MED ORDER — HYDROMORPHONE HCL 2 MG PO TABS
1.0000 mg | ORAL_TABLET | Freq: Four times a day (QID) | ORAL | Status: DC | PRN
  Administered 2020-03-30 – 2020-03-31 (×3): 1 mg via ORAL
  Filled 2020-03-30 (×3): qty 1

## 2020-03-30 MED ORDER — MORPHINE SULFATE (PF) 2 MG/ML IV SOLN
2.0000 mg | INTRAVENOUS | Status: DC | PRN
  Administered 2020-03-30 (×2): 2 mg via INTRAVENOUS
  Filled 2020-03-30 (×2): qty 1

## 2020-03-30 MED ORDER — INSULIN ASPART 100 UNIT/ML ~~LOC~~ SOLN
0.0000 [IU] | Freq: Three times a day (TID) | SUBCUTANEOUS | Status: DC
Start: 2020-03-30 — End: 2020-03-30

## 2020-03-30 MED ORDER — CIPROFLOXACIN IN D5W 400 MG/200ML IV SOLN
400.0000 mg | Freq: Two times a day (BID) | INTRAVENOUS | Status: DC
  Administered 2020-03-30 – 2020-03-31 (×2): 400 mg via INTRAVENOUS
  Filled 2020-03-30 (×3): qty 200

## 2020-03-30 MED ORDER — ASPIRIN EC 81 MG PO TBEC
81.0000 mg | DELAYED_RELEASE_TABLET | Freq: Every day | ORAL | Status: DC
  Administered 2020-03-31: 81 mg via ORAL
  Filled 2020-03-30: qty 1

## 2020-03-30 MED ORDER — FAMOTIDINE 20 MG PO TABS
20.0000 mg | ORAL_TABLET | Freq: Every day | ORAL | Status: DC
  Administered 2020-03-30 – 2020-03-31 (×2): 20 mg via ORAL
  Filled 2020-03-30 (×2): qty 1

## 2020-03-30 MED ORDER — CLONIDINE HCL 0.1 MG PO TABS
0.1000 mg | ORAL_TABLET | Freq: Two times a day (BID) | ORAL | Status: DC
  Filled 2020-03-30: qty 1

## 2020-03-30 MED ORDER — PROMETHAZINE HCL 25 MG/ML IJ SOLN: 6.2500 mg | Freq: Four times a day (QID) | INTRAMUSCULAR | Status: DC | PRN

## 2020-03-30 MED ORDER — TRAMADOL HCL 50 MG PO TABS
50.0000 mg | ORAL_TABLET | Freq: Four times a day (QID) | ORAL | Status: DC | PRN
  Filled 2020-03-30: qty 1

## 2020-03-30 MED ORDER — MORPHINE SULFATE 15 MG PO TABS
15.0000 mg | ORAL_TABLET | ORAL | Status: DC | PRN
  Administered 2020-03-30: 15 mg via ORAL
  Filled 2020-03-30: qty 1

## 2020-03-30 MED ORDER — ONDANSETRON HCL 4 MG PO TABS
4.0000 mg | ORAL_TABLET | Freq: Four times a day (QID) | ORAL | Status: DC | PRN
  Administered 2020-03-30: 4 mg via ORAL
  Filled 2020-03-30: qty 1

## 2020-03-30 MED ORDER — CLONAZEPAM 0.5 MG PO TABS: 0.5000 mg | ORAL_TABLET | Freq: Two times a day (BID) | ORAL | Status: DC | PRN

## 2020-03-30 MED ORDER — MORPHINE SULFATE 10 MG/5ML PO SOLN
5.0000 mg | ORAL | Status: DC | PRN
  Administered 2020-03-30: 5 mg via ORAL
  Filled 2020-03-30: qty 5

## 2020-03-30 MED ORDER — ONDANSETRON HCL 4 MG/2ML IJ SOLN
4.0000 mg | Freq: Four times a day (QID) | INTRAMUSCULAR | Status: DC | PRN
  Administered 2020-03-30: 4 mg via INTRAVENOUS
  Filled 2020-03-30: qty 2

## 2020-03-30 MED ORDER — ZOLPIDEM TARTRATE 5 MG PO TABS
5.0000 mg | ORAL_TABLET | Freq: Every day | ORAL | Status: DC
  Administered 2020-03-30: 5 mg via ORAL
  Filled 2020-03-30: qty 1

## 2020-03-30 NOTE — ED Notes (Signed)
Pt provided urine sample, no BM at this time for stool sample

## 2020-03-30 NOTE — Progress Notes (Signed)
Pharmacy Antibiotic Note  [name] is a 64 y.o. female admitted on 03/29/2020 with IAI.  Pharmacy has been consulted for cipro dosing.  Plan: Cipro 400mg  IV q12hrs  Height: 5\' 3"  (160 cm) Weight: 66.2 kg (146 lb) IBW/kg (Calculated) : 52.4  Temp (24hrs), Avg:98.5 F (36.9 C), Min:98.2 F (36.8 C), Max:98.7 F (37.1 C)  Recent Labs  Lab 03/23/20 0527 03/23/20 0907 03/24/20 0454 03/25/20 0423 03/26/20 0531 03/29/20 2123  WBC 29.0*  --  25.1* 26.1* 28.1* 40.6*  CREATININE 0.97  --  1.15* 1.07* 1.04* 1.01*  LATICACIDVEN 3.4* 2.6*  --   --   --   --     Estimated Creatinine Clearance: 52.8 mL/min (A) (by C-G formula based on SCr of 1.01 mg/dL (H)).    Allergies  Allergen Reactions  . Cefepime Anaphylaxis, Shortness Of Breath, Swelling and Dermatitis  . Edoxaban Other (See Comments)    Causes hematomas  . Acetaminophen     Will not take after Cyanide deaths, something changed when they mkt'd again.  Can take Childrens Liquid but sometimes upset stomach too  . Aspirin     Makes anemic  . Beta Adrenergic Blockers     Paradoxical reactions  . Fentanyl And Related     fever  . Lisinopril     Can only take her own mfr.    . Midazolam Hcl     fever  . Prednisone     REACTION: ibs probs  . Propofol     REACTION: FEVER  . Repatha [Evolocumab] Other (See Comments)    Knocked her out for 10 days  . Statins     GI issues  . Sulfamethoxazole-Trimethoprim     REACTION: antibiotic induced colitis    Antimicrobials this admission:   >>    >>   Dose adjustments this admission:   Microbiology results:  BCx:   UCx:    Sputum:    MRSA PCR:   Thank you for allowing pharmacy to be a part of this patient's care.  05/29/20 A 03/30/2020 4:54 AM

## 2020-03-30 NOTE — ED Notes (Signed)
Pt to toilet to provide urine sample at this time. Will also collect stool if BM occurs while on toilet

## 2020-03-30 NOTE — Plan of Care (Signed)
  Problem: Education: Goal: Knowledge of General Education information will improve Description: Including pain rating scale, medication(s)/side effects and non-pharmacologic comfort measures Outcome: Progressing   Problem: Health Behavior/Discharge Planning: Goal: Ability to manage health-related needs will improve Outcome: Progressing   Problem: Clinical Measurements: Goal: Ability to maintain clinical measurements within normal limits will improve Outcome: Progressing   Problem: Safety: Goal: Ability to remain free from injury will improve Outcome: Progressing   Problem: Skin Integrity: Goal: Risk for impaired skin integrity will decrease Outcome: Progressing Note: Pt admitted from the ED at 0300. Pt oriented to room and how to use call bell . Bed alarm placed and items within reach. Pt given pain medication see MAR. IV patent and infusing

## 2020-03-30 NOTE — Progress Notes (Signed)
Patient refused Tramadol states " I can't take it " . Dr. Fran Lowes aware, patient also refused lovenox , states she can only take " Old medications " d/t it makes her bleed out. Will notify Dr. Fran Lowes to address on rounds,

## 2020-03-30 NOTE — ED Provider Notes (Signed)
_________________________ 12:53 AM on 03/30/2020 -----------------------------------------  CT concerning for acute diverticulitis.  With white count of 40 and initial tachycardia with a pulse of 103 we will get patient admitted to the hospitalist.  Due to severe side effects to several antibiotics will cover with IV Cipro and Flagyl.  Will discuss with the hospitalist for admission. No stool yet. C. Diff pending until patient is able to provide a sample.    I have personally reviewed the images performed during this visit and I agree with the Radiologist's read.   Interpretation by Radiologist:  CT Abdomen Pelvis W Contrast  Result Date: 03/29/2020 CLINICAL DATA:  C diff colitis.  Concern for complication. EXAM: CT ABDOMEN AND PELVIS WITH CONTRAST TECHNIQUE: Multidetector CT imaging of the abdomen and pelvis was performed using the standard protocol following bolus administration of intravenous contrast. CONTRAST:  OMNIPAQUE IOHEXOL 300 MG/ML  SOLN COMPARISON:  CT dated March 22, 2020 FINDINGS: Lower chest: The lung bases are clear. The heart size is normal. Hepatobiliary: The liver is normal. Normal gallbladder.There is no biliary ductal dilation. Pancreas: Normal contours without ductal dilatation. No peripancreatic fluid collection. Spleen: Unremarkable. Adrenals/Urinary Tract: --Adrenal glands: Unremarkable. --Right kidney/ureter: No hydronephrosis or radiopaque kidney stones. --Left kidney/ureter: No hydronephrosis or radiopaque kidney stones. --Urinary bladder: Unremarkable. Stomach/Bowel: --Stomach/Duodenum: Is a moderate-sized hernia --Small bowel: Unremarkable. --Colon: Severe pancolonic diverticulosis is again noted there is some STIR frontal wall thickening of the splenic flexure the colon and portions of the descending colon and transverse colon. There is evidence for free air. There is no adjacent fluid collection. --Appendix: Normal. Vascular/Lymphatic: Atherosclerotic calcification  is present within the non-aneurysmal abdominal aorta, without hemodynamically significant stenosis. --No retroperitoneal lymphadenopathy. --No mesenteric lymphadenopathy. --No pelvic or inguinal lymphadenopathy. Reproductive: Status post hysterectomy. No adnexal mass. Other: No ascites or free air. The abdominal wall is normal. Musculoskeletal. There is a new acute fracture through the inferior endplate of the L2 vertebral body. There is no significant associated height loss. Again noted is grade 1 anterolisthesis L4 on L5. IMPRESSION: 1. New acute fracture through the inferior endplate of the L2 vertebral body. There is no significant associated height loss. 2. Severe pancolonic diverticulosis with findings of acute diverticulitis involving the splenic flexure the colon and portions of the descending colon and transverse colon. There is evidence for free air. No adjacent fluid collection. 3. Moderate-sized hiatal hernia. 4. Pancolonic diverticulosis is again noted. Aortic Atherosclerosis (ICD10-I70.0). Electronically Signed   By: Katherine Mantle M.D.   On: 03/29/2020 23:56      Nita Sickle, MD 03/30/20 [phone]

## 2020-03-30 NOTE — Plan of Care (Signed)

## 2020-03-30 NOTE — Progress Notes (Signed)
PROGRESS NOTE    [name]  BJY:[id number] DOB: [birth year] DOA: 03/29/2020 PCP: Patient, No Pcp Per    Assessment & Plan:   Principal Problem:   Sepsis (HCC) Active Problems:   Essential hypertension   Chronic pain syndrome   Coronary artery disease involving native coronary artery of native heart without angina pectoris   Acute diverticulitis   COPD with chronic bronchitis (HCC)    [name] is a 64 y.o. female with medical history significant for COPD, HTN, CAD status post CABG x4 and migraine, recent hospitalization from 6/5 - 6/9 for sepsis of unclear etiology, who presented to the emergency room 4 days post discharge with severe abdominal pain described as crampy nonradiating and with no aggravating or alleviating factors, associated with nausea but without vomiting.  Due to intractable nausea she reported not being able to tolerate anything by mouth.  She denied fever or chills.   # Acute diverticulitis # Sepsis ruled out -Patient with tachycardia, leukocytosis of 40,000 though without fever.   -CT abdomen and pelvis initially reported as extensive diverticulitis with free air. -IV Cipro and metronidazole started on presentation. PLAN: --continue IV cipro/flagyl -d/c MIVF since tolerating clear liquid diet --GenSurg Dr. Lady Gary who upon reviewing the CT a/p found that there was no free air and only minor diverticulitis near the splenic flexure.  Radiology review agreed and amended the report.  New acute fracture through the inferior endplate of the L2 vertebral body -PO morphine for pain.  No IV pain meds!    Essential hypertension -Continue home meds    Chronic pain syndrome -PO morphine for back pain due to new fracture.  No IV pain meds!    Coronary artery disease involving native coronary artery of native heart without angina pectoris -No acute concerns -Continue home meds    COPD with chronic bronchitis (HCC), stable -Not respiratory issues.   Not on scheduled daily bronchodilators at home.    DVT prophylaxis: Lovenox SQ Code Status: Full code  Family Communication:  Status is: change to inpatient Dispo:   The patient is from: home Anticipated d/c is to: home Anticipated d/c date is: 1-2 days Patient currently is not medically stable to d/c due to:  Has diverticulitis with significant leukocytosis.  Will give a few days of IV abx, and if improving, then can discharge on oral abx.   Subjective and Interval History:  Pt mostly complained of lower back pain.  Tolerating clear liquid diet.  No N/V, no more diarrhea (or BM) since presentation.  No fever.    Pt has been insisting on getting IV pain meds, and said she received IV dilaudid just a few days ago during her last hospitalization.    I contacted GenSurg who upon reviewing the CT a/p found that there was no free air and only minor diverticulitis near the splenic flexure.     Objective: Vitals:   03/30/20 0230 03/30/20 0323 03/30/20 0817 03/30/20 1130  BP: (!) 157/75 (!) 145/91 (!) 157/76 (!) 151/72  Pulse: (!) 103 (!) 102 (!) 101 94  Resp: 15 17 15 14   Temp:  98.7 F (37.1 C) 98.6 F (37 C) 98.9 F (37.2 C)  TempSrc:  Oral Oral Oral  SpO2: 94% 99% 97% 97%  Weight:  66.2 kg    Height:  5\' 3"  (1.6 m)      Intake/Output Summary (Last 24 hours) at 03/30/2020 1620 Last data filed at 03/30/2020 0332 Gross per 24 hour  Intake 313.43  ml  Output --  Net 313.43 ml   Filed Weights   03/29/20 2117 03/30/20 0323  Weight: 65 kg 66.2 kg    Examination:   Constitutional: NAD, AAOx3 HEENT: conjunctivae and lids normal, EOMI CV: RRR no M,R,G. Distal pulses +2.  No cyanosis.   RESP: CTA B/L, normal respiratory effort  GI: +BS, NTND Extremities: No effusions, edema, or tenderness in BLE MSK: moving around freely SKIN: warm, dry and intact Neuro: II - XII grossly intact.  Sensation intact    Data Reviewed: I have personally reviewed following labs and imaging  studies  CBC: Recent Labs  Lab 03/24/20 0454 03/25/20 0423 03/26/20 0531 03/29/20 2123  WBC 25.1* 26.1* 28.1* 40.6*  NEUTROABS 22.3* 19.6* 23.6*  --   HGB 15.3* 15.6* 15.6* 14.4  HCT 46.0 46.5* 45.9 42.7  MCV 85.7 85.2 83.5 85.1  PLT 571* 505* 442* 402*   Basic Metabolic Panel: Recent Labs  Lab 03/24/20 0454 03/25/20 0423 03/26/20 0531 03/29/20 2123  NA 132* 136 131* 131*  K 3.7 3.6 3.6 3.4*  CL 86* 89* 86* 89*  CO2 29 33* 30 27  GLUCOSE 285* 131* 183* 419*  BUN 19 25* 23 10  CREATININE 1.15* 1.07* 1.04* 1.01*  CALCIUM 9.4 9.1 8.8* 8.9  MG 2.9*  --   --   --    GFR: Estimated Creatinine Clearance: 52.8 mL/min (A) (by C-G formula based on SCr of 1.01 mg/dL (H)). Liver Function Tests: Recent Labs  Lab 03/29/20 2123  AST 27  ALT 21  ALKPHOS 98  BILITOT 1.0  PROT 6.8  ALBUMIN 3.7   Recent Labs  Lab 03/29/20 2123  LIPASE 20   No results for input(s): AMMONIA in the last 168 hours. Coagulation Profile: Recent Labs  Lab 03/30/20 0602  INR 1.0   Cardiac Enzymes: No results for input(s): CKTOTAL, CKMB, CKMBINDEX, TROPONINI in the last 168 hours. BNP (last 3 results) No results for input(s): PROBNP in the last 8760 hours. HbA1C: No results for input(s): HGBA1C in the last 72 hours. CBG: Recent Labs  Lab 03/25/20 2044 03/26/20 0836 03/26/20 1132 03/30/20 0852 03/30/20 1131  GLUCAP 180* 186* 171* 145* 128*   Lipid Profile: No results for input(s): CHOL, HDL, LDLCALC, TRIG, CHOLHDL, LDLDIRECT in the last 72 hours. Thyroid Function Tests: No results for input(s): TSH, T4TOTAL, FREET4, T3FREE, THYROIDAB in the last 72 hours. Anemia Panel: No results for input(s): VITAMINB12, FOLATE, FERRITIN, TIBC, IRON, RETICCTPCT in the last 72 hours. Sepsis Labs: Recent Labs  Lab 03/24/20 0454 03/25/20 0423 03/30/20 0602  PROCALCITON 0.18 0.10 0.28    Recent Results (from the past 240 hour(s))  Culture, blood (Routine x 2)     Status: None   Collection  Time: 03/22/20  1:45 PM   Specimen: BLOOD  Result Value Ref Range Status   Specimen Description BLOOD LEFT FOREARM  Final   Special Requests   Final    BOTTLES DRAWN AEROBIC AND ANAEROBIC Blood Culture adequate volume   Culture   Final    NO GROWTH 5 DAYS Performed at [facility], [address] [place], [place] [address]    Report Status 03/27/2020 FINAL  Final  Culture, blood (Routine x 2)     Status: None   Collection Time: 03/22/20  2:19 PM   Specimen: BLOOD  Result Value Ref Range Status   Specimen Description BLOOD BLOOD LEFT FOREARM  Final   Special Requests   Final    BOTTLES DRAWN  AEROBIC AND ANAEROBIC Blood Culture results may not be optimal due to an excessive volume of blood received in culture bottles   Culture   Final    NO GROWTH 5 DAYS Performed at [facility], [address] [place], [place] [address]    Report Status 03/27/2020 FINAL  Final  SARS Coronavirus 2 by RT PCR (hospital order, performed in [facility] hospital lab) Nasopharyngeal Nasopharyngeal Swab     Status: None   Collection Time: 03/22/20  2:19 PM   Specimen: Nasopharyngeal Swab  Result Value Ref Range Status   SARS Coronavirus 2 NEGATIVE NEGATIVE Final    Comment: (NOTE) SARS-CoV-2 target nucleic acids are NOT DETECTED. The SARS-CoV-2 RNA is generally detectable in upper and lower respiratory specimens during the acute phase of infection. The lowest concentration of SARS-CoV-2 viral copies this assay can detect is 250 copies / mL. A negative result does not preclude SARS-CoV-2 infection and should not be used as the sole basis for treatment or other patient management decisions.  A negative result may occur with improper specimen collection / handling, submission of specimen other than nasopharyngeal swab, presence of viral mutation(s) within the areas targeted by this assay, and inadequate number of viral copies (<250 copies / mL). A negative result must be combined  with clinical observations, patient history, and epidemiological information. Fact Sheet for Patients:   BoilerBrush.com.cy Fact Sheet for Healthcare Providers: https://pope.com/ This test is not yet approved or cleared  by the [place] FDA and has been authorized for detection and/or diagnosis of SARS-CoV-2 by FDA under an Emergency Use Authorization (EUA).  This EUA will remain in effect (meaning this test can be used) for the duration of the COVID-19 declaration under Section 564(b)(1) of the Act, 21 U.S.C. section 360bbb-3(b)(1), unless the authorization is terminated or revoked sooner. Performed at [facility], [address] [place], [place] [address]   Respiratory Panel by PCR     Status: None   Collection Time: 03/23/20  4:37 PM   Specimen: Nasopharyngeal Swab; Respiratory  Result Value Ref Range Status   Adenovirus NOT DETECTED NOT DETECTED Final   Coronavirus 229E NOT DETECTED NOT DETECTED Final    Comment: (NOTE) The Coronavirus on the Respiratory Panel, DOES NOT test for the novel  Coronavirus (2019 nCoV)    Coronavirus HKU1 NOT DETECTED NOT DETECTED Final   Coronavirus NL63 NOT DETECTED NOT DETECTED Final   Coronavirus OC43 NOT DETECTED NOT DETECTED Final   Metapneumovirus NOT DETECTED NOT DETECTED Final   Rhinovirus / Enterovirus NOT DETECTED NOT DETECTED Final   Influenza A NOT DETECTED NOT DETECTED Final   Influenza B NOT DETECTED NOT DETECTED Final   Parainfluenza Virus 1 NOT DETECTED NOT DETECTED Final   Parainfluenza Virus 2 NOT DETECTED NOT DETECTED Final   Parainfluenza Virus 3 NOT DETECTED NOT DETECTED Final   Parainfluenza Virus 4 NOT DETECTED NOT DETECTED Final   Respiratory Syncytial Virus NOT DETECTED NOT DETECTED Final   Bordetella pertussis NOT DETECTED NOT DETECTED Final   Chlamydophila pneumoniae NOT DETECTED NOT DETECTED Final   Mycoplasma pneumoniae NOT DETECTED NOT DETECTED Final      Comment: Performed at [facility], [address], [place], [place] [address]  MRSA PCR Screening     Status: None   Collection Time: 03/23/20  4:37 PM  Result Value Ref Range Status   MRSA by PCR NEGATIVE NEGATIVE Final    Comment:        The  GeneXpert MRSA Assay (FDA approved for NASAL specimens only), is one component of a comprehensive MRSA colonization surveillance program. It is not intended to diagnose MRSA infection nor to guide or monitor treatment for MRSA infections. Performed at [facility], [address] [place], [place] [address]   SARS Coronavirus 2 by RT PCR (hospital order, performed in [facility] hospital lab) Nasopharyngeal Nasopharyngeal Swab     Status: None   Collection Time: 03/30/20  1:07 AM   Specimen: Nasopharyngeal Swab  Result Value Ref Range Status   SARS Coronavirus 2 NEGATIVE NEGATIVE Final    Comment: (NOTE) SARS-CoV-2 target nucleic acids are NOT DETECTED.  The SARS-CoV-2 RNA is generally detectable in upper and lower respiratory specimens during the acute phase of infection. The lowest concentration of SARS-CoV-2 viral copies this assay can detect is 250 copies / mL. A negative result does not preclude SARS-CoV-2 infection and should not be used as the sole basis for treatment or other patient management decisions.  A negative result may occur with improper specimen collection / handling, submission of specimen other than nasopharyngeal swab, presence of viral mutation(s) within the areas targeted by this assay, and inadequate number of viral copies (<250 copies / mL). A negative result must be combined with clinical observations, patient history, and epidemiological information.  Fact Sheet for Patients:   StrictlyIdeas.no  Fact Sheet for Healthcare Providers: BankingDealers.co.za  This test is not yet approved or  cleared by the [place] FDA and has been  authorized for detection and/or diagnosis of SARS-CoV-2 by FDA under an Emergency Use Authorization (EUA).  This EUA will remain in effect (meaning this test can be used) for the duration of the COVID-19 declaration under Section 564(b)(1) of the Act, 21 U.S.C. section 360bbb-3(b)(1), unless the authorization is terminated or revoked sooner.  Performed at [facility], [address], [place], [place] [address]       Radiology Studies: CT Abdomen Pelvis W Contrast  Addendum Date: 03/30/2020   ADDENDUM REPORT: 03/30/2020 13:35 ADDENDUM: [facility] Dr[facility] Celine Ahr requested a second opinion regarding question of free intraperitoneal air on the original interpretation. There is no evidence of free intraperitoneal air on my scan review. The original report stating "There is evidence for free air" was presumably a dictation error. These addended results were called by telephone at the time of interpretation on 03/30/2020 at 1:35 pm to provider DR. CANNON, who verbally acknowledged these results. Electronically Signed   By: Ilona Sorrel M.D.   On: 03/30/2020 13:35   Result Date: 03/30/2020 CLINICAL DATA:  C diff colitis.  Concern for complication. EXAM: CT ABDOMEN AND PELVIS WITH CONTRAST TECHNIQUE: Multidetector CT imaging of the abdomen and pelvis was performed using the standard protocol following bolus administration of intravenous contrast. CONTRAST:  164mL OMNIPAQUE IOHEXOL 300 MG/ML  SOLN COMPARISON:  CT dated March 22, 2020 FINDINGS: Lower chest: The lung bases are clear. The heart size is normal. Hepatobiliary: The liver is normal. Normal gallbladder.There is no biliary ductal dilation. Pancreas: Normal contours without ductal dilatation. No peripancreatic fluid collection. Spleen: Unremarkable. Adrenals/Urinary Tract: --Adrenal glands: Unremarkable. --Right kidney/ureter: No hydronephrosis or radiopaque kidney stones. --Left kidney/ureter: No hydronephrosis or radiopaque kidney stones.  --Urinary bladder: Unremarkable. Stomach/Bowel: --Stomach/Duodenum: Is a moderate-sized hernia --Small bowel: Unremarkable. --Colon: Severe pancolonic diverticulosis is again noted there is some STIR frontal wall thickening of the splenic flexure the colon and portions of the descending colon and transverse colon. There is evidence for free air. There  is no adjacent fluid collection. --Appendix: Normal. Vascular/Lymphatic: Atherosclerotic calcification is present within the non-aneurysmal abdominal aorta, without hemodynamically significant stenosis. --No retroperitoneal lymphadenopathy. --No mesenteric lymphadenopathy. --No pelvic or inguinal lymphadenopathy. Reproductive: Status post hysterectomy. No adnexal mass. Other: No ascites or free air. The abdominal wall is normal. Musculoskeletal. There is a new acute fracture through the inferior endplate of the L2 vertebral body. There is no significant associated height loss. Again noted is grade 1 anterolisthesis L4 on L5. IMPRESSION: 1. New acute fracture through the inferior endplate of the L2 vertebral body. There is no significant associated height loss. 2. Severe pancolonic diverticulosis with findings of acute diverticulitis involving the splenic flexure the colon and portions of the descending colon and transverse colon. There is evidence for free air. No adjacent fluid collection. 3. Moderate-sized hiatal hernia. 4. Pancolonic diverticulosis is again noted. Aortic Atherosclerosis (ICD10-I70.0). Electronically Signed: By: Katherine Mantle M.D. On: 03/29/2020 23:56     Scheduled Meds: . enoxaparin (LOVENOX) injection  40 mg Subcutaneous Q24H  . insulin aspart  0-15 Units Subcutaneous TID WC   Continuous Infusions: . sodium chloride 100 mL/hr at 03/30/20 0823  . ciprofloxacin 400 mg (03/30/20 1530)  . metronidazole 500 mg (03/30/20 0824)     LOS: 0 days     Darlin Priestly, MD [facility] If 7PM-7AM, please contact  night-coverage 03/30/2020, 4:20 PM

## 2020-03-30 NOTE — Progress Notes (Signed)
Patient given PRN zofran for c/o feeling nauseated, got frustrated with this Clinical research associate and states " I took phenergan while I was in the Er". Educated patient she should take PO medications vs IV since she will not be discharged with it. Patient states " I will wait until the next doctor comes in ".

## 2020-03-30 NOTE — H&P (Signed)
History and Physical    SAVAYAH WALTRIP LMB:[id number] DOB: 1956-01-07 DOA: 03/29/2020  PCP: Patient, No Pcp Per   Patient coming from: home   I have personally briefly reviewed patient's old medical records in [facility]  Chief Complaint: Abdominal pain  HPI: XIA STOHR is a 64 y.o. female with medical history significant for COPD, HTN, CAD status post CABG x4 and migraine, hospitalized from 6/5 - 6/9 for sepsis of unclear etiology, possible bronchitis in a state complicated by anaphylactic reaction to cefepime who presents to the emergency room 4 days post discharge with severe abdominal pain described as crampy nonradiating and with no aggravating or alleviating factors, associated with nausea but without vomiting.  Patient has a history of C. difficile and states that the pain feels similar.  Due to intractable nausea she has not been able to tolerate anything by mouth.  She denies fever or chills. ED Course: On arrival she was tachycardic at 108 with otherwise normal vitals.  Blood work significant for leukocytosis of 40,000.  Urinalysis unremarkable.  CT abdomen and pelvis showed severe pan colonic diverticulosis with findings of acute diverticulitis involving the splenic flexure and portions of the descending and transverse colon.  There is also evidence for free air but no adjacent fluid collection.  Patient started on ciprofloxacin and Flagyl.  Hospitalist consulted for admission.  Review of Systems: As per HPI otherwise 10 point review of systems negative.    Past Medical History:  Diagnosis Date  . Asthma   . Benign essential HTN   . COPD (chronic obstructive pulmonary disease) (HCC)   . IBS (irritable bowel syndrome)   . Migraine headache     History reviewed. No pertinent surgical history.   reports that she has been smoking cigarettes. She has been smoking about 1.00 pack per day. She has never used smokeless tobacco. She reports current alcohol use. She  reports that she does not use drugs.  Allergies  Allergen Reactions  . Cefepime Anaphylaxis, Shortness Of Breath, Swelling and Dermatitis  . Edoxaban Other (See Comments)    Causes hematomas  . Acetaminophen     Will not take after Cyanide deaths, something changed when they mkt'd again.  Can take Childrens Liquid but sometimes upset stomach too  . Aspirin     Makes anemic  . Beta Adrenergic Blockers     Paradoxical reactions  . Fentanyl And Related     fever  . Lisinopril     Can only take her own mfr.    . Midazolam Hcl     fever  . Prednisone     REACTION: ibs probs  . Propofol     REACTION: FEVER  . Repatha [Evolocumab] Other (See Comments)    Knocked her out for 10 days  . Statins     GI issues  . Sulfamethoxazole-Trimethoprim     REACTION: antibiotic induced colitis    Family History  Problem Relation Age of Onset  . Hypertension Mother   . CAD Brother        Deceased on MI age 34  . Cancer - Other Father        Oropharyngeal SCCa     Prior to Admission medications   Medication Sig Start Date End Date Taking? Authorizing Provider  albuterol (PROVENTIL HFA;VENTOLIN HFA) 108 (90 BASE) MCG/ACT inhaler Inhale 2 puffs into the lungs 3 (three) times daily as needed for wheezing or shortness of breath.    [provider]  albuterol (PROVENTIL) (2.5 MG/3ML) 0.083% nebulizer solution Take 2.5 mg by nebulization every 6 (six) hours as needed for wheezing or shortness of breath.    [provider]  amLODipine (NORVASC) 5 MG tablet Take 1 tablet (5 mg total) by mouth daily. Patient taking differently: Take 5 mg by mouth 2 (two) times daily.  02/21/20 02/20/21  Willy Eddy, MD  aspirin 81 MG chewable tablet Chew 81 mg by mouth daily. 05/04/16   [provider]  clonazePAM (KLONOPIN) 0.5 MG tablet Take 1 tablet (0.5 mg total) by mouth 2 (two) times daily as needed for anxiety. 03/25/20   Delfino Lovett, MD  diltiazem (DILACOR XR) 180 MG 24 hr capsule  Take 1 capsule (180 mg total) by mouth daily. 03/26/20 04/25/20  Delfino Lovett, MD  diphenoxylate-atropine (LOMOTIL) 2.5-0.025 MG per tablet Take 1-2 tablets by mouth 3 (three) times daily as needed for diarrhea or loose stools (for bowel spasms).    [provider]  EPINEPHrine (EPIPEN IJ) Inject 0.3 mg as directed as needed (Bee Stings).    [provider]  famotidine (PEPCID) 20 MG tablet Take 1 tablet (20 mg total) by mouth daily. 03/26/20   Delfino Lovett, MD  fluticasone (FLONASE) 50 MCG/ACT nasal spray Place 1-2 sprays into both nostrils daily at 6 (six) AM. 03/21/20   [provider]  hyoscyamine (LEVSIN SL) 0.125 MG SL tablet Take 0.125-0.25 mg by mouth every 4 (four) hours as needed for cramping.    [provider]  pantoprazole (PROTONIX) 40 MG tablet Take 40 mg by mouth daily. 02/12/20   [provider]  promethazine (PHENERGAN) 12.5 MG tablet Take 1 tablet (12.5 mg total) by mouth every 6 (six) hours as needed. 02/21/20   Willy Eddy, MD  promethazine (PHENERGAN) 25 MG suppository Place 12.5 mg rectally every 8 (eight) hours as needed for nausea or vomiting.    [provider]  ranitidine (ZANTAC) 150 MG tablet Take 150 mg by mouth daily as needed. 05/05/17   [provider]  Tiotropium Bromide-Olodaterol (STIOLTO RESPIMAT) 2.5-2.5 MCG/ACT AERS Inhale 2 puffs into the lungs daily. 02/07/17   [provider]    Physical Exam: Vitals:   03/29/20 2117 03/29/20 2316 03/29/20 2330 03/30/20 0030  BP: (!) 181/91 (!) 178/69 (!) 160/69 (!) 170/83  Pulse: (!) 108 (!) 108 100 (!) 103  Resp: 18 15 17 14   Temp: 98.2 F (36.8 C)     TempSrc: Oral     SpO2: 98% 100% 90% 97%  Weight: 65 kg     Height: 5\' 3"  (1.6 m)        Vitals:   03/29/20 2117 03/29/20 2316 03/29/20 2330 03/30/20 0030  BP: (!) 181/91 (!) 178/69 (!) 160/69 (!) 170/83  Pulse: (!) 108 (!) 108 100 (!) 103  Resp: 18 15 17 14   Temp: 98.2 F (36.8 C)     TempSrc:  Oral     SpO2: 98% 100% 90% 97%  Weight: 65 kg     Height: 5\' 3"  (1.6 m)         Constitutional: Alert and oriented x 3 . Not in any apparent distress HEENT:      Head: Normocephalic and atraumatic.         Eyes: PERLA, EOMI, Conjunctivae are normal. Sclera is non-icteric.       Mouth/Throat: Mucous membranes are moist.       Neck: Supple with no signs of meningismus. Cardiovascular: Regular rate and rhythm. No murmurs,  gallops, or rubs. 2+ symmetrical distal pulses are present . No JVD. No LE edema Respiratory: Respiratory effort normal .Lungs sounds clear bilaterally. No wheezes, crackles, or rhonchi.  Gastrointestinal: Soft, with diffuse tenderness, and non distended with positive bowel sounds. No rebound or guarding. Genitourinary: No CVA tenderness. Musculoskeletal: Nontender with normal range of motion in all extremities. No edema, cyanosis, or erythema of extremities. Neurologic: Normal speech and language. Face is symmetric. Moving all extremities. No gross focal neurologic deficits . Skin: Skin is warm, dry.  No rash or ulcers Psychiatric: Mood and affect are normal Speech and behavior are normal   Labs on Admission: I have personally reviewed following labs and imaging studies  CBC: Recent Labs  Lab 03/23/20 0527 03/24/20 0454 03/25/20 0423 03/26/20 0531 03/29/20 2123  WBC 29.0* 25.1* 26.1* 28.1* 40.6*  NEUTROABS 26.6* 22.3* 19.6* 23.6*  --   HGB 13.3 15.3* 15.6* 15.6* 14.4  HCT 40.3 46.0 46.5* 45.9 42.7  MCV 86.1 85.7 85.2 83.5 85.1  PLT 457* 571* 505* 442* 402*   Basic Metabolic Panel: Recent Labs  Lab 03/23/20 0527 03/24/20 0454 03/25/20 0423 03/26/20 0531 03/29/20 2123  NA 138 132* 136 131* 131*  K 3.3* 3.7 3.6 3.6 3.4*  CL 96* 86* 89* 86* 89*  CO2 28 29 33* 30 27  GLUCOSE 196* 285* 131* 183* 419*  BUN 8 19 25* 23 10  CREATININE 0.97 1.15* 1.07* 1.04* 1.01*  CALCIUM 9.0 9.4 9.1 8.8* 8.9  MG 1.7 2.9*  --   --   --    GFR: Estimated Creatinine  Clearance: 52.3 mL/min (A) (by C-G formula based on SCr of 1.01 mg/dL (H)). Liver Function Tests: Recent Labs  Lab 03/29/20 2123  AST 27  ALT 21  ALKPHOS 98  BILITOT 1.0  PROT 6.8  ALBUMIN 3.7   Recent Labs  Lab 03/29/20 2123  LIPASE 20   No results for input(s): AMMONIA in the last 168 hours. Coagulation Profile: No results for input(s): INR, PROTIME in the last 168 hours. Cardiac Enzymes: No results for input(s): CKTOTAL, CKMB, CKMBINDEX, TROPONINI in the last 168 hours. BNP (last 3 results) No results for input(s): PROBNP in the last 8760 hours. HbA1C: No results for input(s): HGBA1C in the last 72 hours. CBG: Recent Labs  Lab 03/25/20 1155 03/25/20 1641 03/25/20 2044 03/26/20 0836 03/26/20 1132  GLUCAP 278* 86 180* 186* 171*   Lipid Profile: No results for input(s): CHOL, HDL, LDLCALC, TRIG, CHOLHDL, LDLDIRECT in the last 72 hours. Thyroid Function Tests: No results for input(s): TSH, T4TOTAL, FREET4, T3FREE, THYROIDAB in the last 72 hours. Anemia Panel: No results for input(s): VITAMINB12, FOLATE, FERRITIN, TIBC, IRON, RETICCTPCT in the last 72 hours. Urine analysis:    Component Value Date/Time   COLORURINE YELLOW (A) 03/30/2020 0107   APPEARANCEUR CLEAR (A) 03/30/2020 0107   LABSPEC 1.030 03/30/2020 0107   PHURINE 7.0 03/30/2020 0107   GLUCOSEU >=500 (A) 03/30/2020 0107   HGBUR NEGATIVE 03/30/2020 0107   HGBUR trace-lysed 05/22/2010 1343   BILIRUBINUR NEGATIVE 03/30/2020 0107   KETONESUR NEGATIVE 03/30/2020 0107   PROTEINUR NEGATIVE 03/30/2020 0107   UROBILINOGEN 0.2 05/22/2010 1343   NITRITE NEGATIVE 03/30/2020 0107   LEUKOCYTESUR NEGATIVE 03/30/2020 0107    Radiological Exams on Admission: CT Abdomen Pelvis W Contrast  Result Date: 03/29/2020 CLINICAL DATA:  C diff colitis.  Concern for complication. EXAM: CT ABDOMEN AND PELVIS WITH CONTRAST TECHNIQUE: Multidetector CT imaging of the abdomen and pelvis was performed using the standard  protocol  following bolus administration of intravenous contrast. CONTRAST:  148mL OMNIPAQUE IOHEXOL 300 MG/ML  SOLN COMPARISON:  CT dated March 22, 2020 FINDINGS: Lower chest: The lung bases are clear. The heart size is normal. Hepatobiliary: The liver is normal. Normal gallbladder.There is no biliary ductal dilation. Pancreas: Normal contours without ductal dilatation. No peripancreatic fluid collection. Spleen: Unremarkable. Adrenals/Urinary Tract: --Adrenal glands: Unremarkable. --Right kidney/ureter: No hydronephrosis or radiopaque kidney stones. --Left kidney/ureter: No hydronephrosis or radiopaque kidney stones. --Urinary bladder: Unremarkable. Stomach/Bowel: --Stomach/Duodenum: Is a moderate-sized hernia --Small bowel: Unremarkable. --Colon: Severe pancolonic diverticulosis is again noted there is some STIR frontal wall thickening of the splenic flexure the colon and portions of the descending colon and transverse colon. There is evidence for free air. There is no adjacent fluid collection. --Appendix: Normal. Vascular/Lymphatic: Atherosclerotic calcification is present within the non-aneurysmal abdominal aorta, without hemodynamically significant stenosis. --No retroperitoneal lymphadenopathy. --No mesenteric lymphadenopathy. --No pelvic or inguinal lymphadenopathy. Reproductive: Status post hysterectomy. No adnexal mass. Other: No ascites or free air. The abdominal wall is normal. Musculoskeletal. There is a new acute fracture through the inferior endplate of the L2 vertebral body. There is no significant associated height loss. Again noted is grade 1 anterolisthesis L4 on L5. IMPRESSION: 1. New acute fracture through the inferior endplate of the L2 vertebral body. There is no significant associated height loss. 2. Severe pancolonic diverticulosis with findings of acute diverticulitis involving the splenic flexure the colon and portions of the descending colon and transverse colon. There is evidence for free air. No  adjacent fluid collection. 3. Moderate-sized hiatal hernia. 4. Pancolonic diverticulosis is again noted. Aortic Atherosclerosis (ICD10-I70.0). Electronically Signed   By: Constance Holster M.D.   On: 03/29/2020 23:56    EKG: Independently reviewed.   Assessment/Plan Principal Problem:   Sepsis ([place])   Acute diverticulitis -Patient with borderline sepsis criteria to include tachycardia, leukocytosis of 40,000 though without fever, but in the setting of recent antibiotic treatment, with diverticulitis on CT -Patient was recently hospitalized with sepsis of unknown source -CT abdomen and pelvis showing extensive diverticulitis -IV Cipro and metronidazole.  Patient had recent anaphylactic reaction to cefepime -IV hydration -Clear liquid diet -IV pain medication and antiemetics -Consider surgical input given evidence for free air but without fluid collection    Essential hypertension -Continue home meds    Chronic pain syndrome -Pain control    Coronary artery disease involving native coronary artery of native heart without angina pectoris -No acute concerns -Continue home meds    COPD with chronic bronchitis ([place]) -Continue home meds.  DuoNebs as needed    DVT prophylaxis: Lovenox  Code Status: full code  Family Communication:  none  Disposition Plan: Back to previous home environment Consults called: none  Status:obs    Athena Masse MD [facility]     03/30/2020, 2:09 AM

## 2020-03-30 NOTE — Progress Notes (Signed)
  Called to see patient who had questions about her medication specifically medication for pain, anxiety and hypertension.  Anxiety -Patient does not use chronic anxiety medication. -States she was given Klonopin and has been given Xanax during current and recent past hospitalization -Would like the Klonopin discontinued and does not want anything for anxiety -Would like Ambien to help with sleep if needed -Klonopin discontinued -Ambien ordered as needed sleep  Pain from acute L2 vertebral body fracture -Patient reports insufficient relief from morphine IR -Patient does not have chronic pain and is not on pain medication in the outpatient -States she once received Dilaudid which caused her less nausea than the morphine and had a longer duration of effect -Oral Dilaudid, 1 mg every 8 as needed pain ordered to replace MSIR  Essential hypertension -Patient has never used clonidine and does not want to continue -Would like her home amlodipine restarted -DC'd clonidine -Home amlodipine was already ordered so patient reassured.

## 2020-03-31 LAB — BASIC METABOLIC PANEL
Anion gap: 6 (ref 5–15)
BUN: 7 mg/dL — ABNORMAL LOW (ref 8–23)
CO2: 28 mmol/L (ref 22–32)
Calcium: 7.7 mg/dL — ABNORMAL LOW (ref 8.9–10.3)
Chloride: 98 mmol/L (ref 98–111)
Creatinine, Ser: 0.8 mg/dL (ref 0.44–1.00)
GFR calc Af Amer: >60 mL/min (ref >60)
GFR calc non Af Amer: >60 mL/min (ref >60)
Glucose, Bld: 168 mg/dL — ABNORMAL HIGH (ref 70–99)
Potassium: 3 mmol/L — ABNORMAL LOW (ref 3.5–5.1)
Sodium: 132 mmol/L — ABNORMAL LOW (ref 135–145)

## 2020-03-31 LAB — CBC
HCT: 32.8 % — ABNORMAL LOW (ref 36.0–46.0)
Hemoglobin: 10.9 g/dL — ABNORMAL LOW (ref 12.0–15.0)
MCH: 28.1 pg (ref 26.0–34.0)
MCHC: 33.2 g/dL (ref 30.0–36.0)
MCV: 84.5 fL (ref 80.0–100.0)
Platelets: 337 10*3/uL (ref 150–400)
RBC: 3.88 MIL/uL (ref 3.87–5.11)
RDW: 13.3 % (ref 11.5–15.5)
WBC: 21.1 10*3/uL — ABNORMAL HIGH (ref 4.0–10.5)
nRBC: 0 % (ref 0.0–0.2)

## 2020-03-31 LAB — GLUCOSE, CAPILLARY
Glucose-Capillary: 223 mg/dL — ABNORMAL HIGH (ref 70–99)
Glucose-Capillary: 77 mg/dL (ref 70–99)

## 2020-03-31 LAB — MAGNESIUM: Magnesium: 1.7 mg/dL (ref 1.7–2.4)

## 2020-03-31 LAB — PROCALCITONIN: Procalcitonin: 0.11 ng/mL

## 2020-03-31 MED ORDER — HYDROMORPHONE HCL 2 MG PO TABS: 2.0000 mg | ORAL_TABLET | Freq: Four times a day (QID) | ORAL | 0 refills | Status: AC | PRN

## 2020-03-31 MED ORDER — CIPROFLOXACIN HCL 500 MG PO TABS: 500.0000 mg | ORAL_TABLET | Freq: Two times a day (BID) | ORAL | 0 refills | Status: AC

## 2020-03-31 MED ORDER — METRONIDAZOLE 500 MG PO TABS: 500.0000 mg | ORAL_TABLET | Freq: Three times a day (TID) | ORAL | Status: DC

## 2020-03-31 MED ORDER — CIPROFLOXACIN HCL 500 MG PO TABS
500.0000 mg | ORAL_TABLET | Freq: Two times a day (BID) | ORAL | Status: DC
  Filled 2020-03-31 (×2): qty 1

## 2020-03-31 MED ORDER — METRONIDAZOLE 500 MG PO TABS: 500.0000 mg | ORAL_TABLET | Freq: Three times a day (TID) | ORAL | 0 refills | Status: AC

## 2020-03-31 MED ORDER — AMLODIPINE BESYLATE 5 MG PO TABS: 5.0000 mg | ORAL_TABLET | Freq: Two times a day (BID) | ORAL | 0 refills | Status: DC

## 2020-03-31 MED ORDER — POTASSIUM CHLORIDE CRYS ER 20 MEQ PO TBCR
40.0000 meq | EXTENDED_RELEASE_TABLET | ORAL | Status: AC
  Administered 2020-03-31 (×2): 40 meq via ORAL
  Filled 2020-03-31 (×2): qty 2

## 2020-03-31 NOTE — Progress Notes (Signed)
DC papers given to pt. Educate with her med, questions answered.

## 2020-03-31 NOTE — Discharge Summary (Signed)
Physician Discharge Summary   [name]  female DOB: 09-Feb-1956  OFH:[id number]  PCP: Patient, No Pcp Per  Admit date: 03/29/2020 Discharge date: 03/31/2020  Admitted From: home Disposition:  home CODE STATUS: Full code  Discharge Instructions    Discharge instructions   Complete by: As directed    Your diverticulitis was not as severe as originally reported by radiology.  You received 2 days of IV abx and can go home to finish 5 more days of oral cipro and Flagyl.  Please follow up with your PCP in about 1 week after discharge to check your potassium level and white blood cell count. [facility] Course:  For full details, please see H&P, progress notes, consult notes and ancillary notes.  Briefly,  Kayla Herst Wampleris a 64 y.o.femalewith medical history significant forCOPD, HTN, CAD status post CABG x4 and migraine, recent hospitalization from 6/5 - 6/74for sepsis of unclear etiology, who presented to the emergency room 3 days post discharge with severe abdominal pain described as crampy nonradiating and with no aggravating or alleviating factors, associated with nausea but without vomiting. Due to intractable nausea she reported not being able to tolerate anything by mouth. She denied fever or chills.   # Acute diverticulitis # Sepsis ruled out Patient with tachycardia, leukocytosis of 40,000 though WBC was 28.1 on the day of her last discharge on 03/26/20.   Pt without fever.  CT abdomen and pelvis initially reported as extensive diverticulitis with free air, however, GenSurg Dr. Lady Gary who upon reviewing the CT a/p found that there was no free air and only minor diverticulitis near the splenic flexure.  Radiology review agreed and amended the report.  IV Cipro and metronidazole started on presentation, and WBC trended down to 21.1 about 2 days later.  Procal also trended down from 0.28 to 0.11.  Pt did not have any diarrhea so stool test not obtained.  Pt was  tolerating clear liquid diet and asked to advance to regular prior to discharge.  Since pt was stable, able to tolerate PO intake and all infectious labs improved, pt was discharged on 5 more days of oral cipro/flagyl.  New acute fracture through the inferior endplate of the L2 vertebral body Pt was prescribed IV morphine on presentation, which was d/c'ed after admission, since it was not indicated in this fracture in someone who could take oral.  Pt's request for IV dilaudid was not granted.  PO morphine PRN was prescribed, which then was changed to PO dilaudid by night oncall provider due to pt's preference.  Pt was discharged with a short course of oral dialudid.  Essential hypertension Pt said she no longer took clonidine and Dilt, so only amlodipine was continued.  Chronic pain syndrome Short course of oral opioids for back pain due to new fracture.    Coronary artery disease involving native coronary artery of native heart without angina pectoris No acute concerns.  COPD with chronic bronchitis (HCC), stable Not respiratory issues.  Not on scheduled daily bronchodilators at home.   Discharge Diagnoses:  Principal Problem:   Sepsis (HCC) Active Problems:   Essential hypertension   Chronic pain syndrome   Coronary artery disease involving native coronary artery of native heart without angina pectoris   Acute diverticulitis   COPD with chronic bronchitis (HCC)   Diverticulitis    Discharge Instructions:  Allergies as of 03/31/2020      Reactions   Cefepime Anaphylaxis, Shortness Of  Breath, Swelling, Dermatitis   Edoxaban Other (See Comments)   Causes hematomas   Barium Iodide Rash   Other    Other reaction(s): Other Preservatives (C-Diff) Other reaction(s): Unknown Preservatives (C-Diff) Other reaction(s): OTHER   Acetaminophen    Will not take after Cyanide deaths, something changed when they mkt'd again.  Can take Childrens Liquid but sometimes upset stomach  too   Albuterol Hypertension   ABLE TO TOLERATE IN INHALER FORM NOT NEBULIZER    Aspirin    Makes anemic   Beta Adrenergic Blockers    Paradoxical reactions   Diltiazem Other (See Comments)   Patient is intolerant to all CALCIUM CHANNEL BLOCKERS, causes her blood pressure to rise.    Fentanyl And Related    fever   Lisinopril    Can only take her own mfr.     Midazolam Hcl    fever   Oxycodone    Prednisone    REACTION: ibs probs   Propofol    REACTION: FEVER   Repatha [evolocumab] Other (See Comments)   Knocked her out for 10 days   Statins    GI issues   Sulfamethoxazole-trimethoprim    REACTION: antibiotic induced colitis   Lac Bovis Other (See Comments)      Medication List    STOP taking these medications   amoxicillin-clavulanate 250-62.5 MG/5ML suspension Commonly known as: AUGMENTIN   clonazePAM 0.5 MG tablet Commonly known as: KLONOPIN   cloNIDine 0.1 MG tablet Commonly known as: CATAPRES   diltiazem 180 MG 24 hr capsule Commonly known as: DILACOR XR     TAKE these medications   albuterol 108 (90 Base) MCG/ACT inhaler Commonly known as: VENTOLIN HFA Inhale 2 puffs into the lungs 3 (three) times daily as needed for wheezing or shortness of breath.   amLODipine 5 MG tablet Commonly known as: NORVASC Take 1 tablet (5 mg total) by mouth 2 (two) times daily.   aspirin 81 MG chewable tablet Chew 81 mg by mouth daily.   ciprofloxacin 500 MG tablet Commonly known as: CIPRO Take 1 tablet (500 mg total) by mouth 2 (two) times daily for 5 days.   diphenoxylate-atropine 2.5-0.025 MG tablet Commonly known as: LOMOTIL Take 1-2 tablets by mouth 3 (three) times daily as needed for diarrhea or loose stools (for bowel spasms).   EPIPEN IJ Inject 0.3 mg as directed as needed (Bee Stings).   famotidine 20 MG tablet Commonly known as: PEPCID Take 1 tablet (20 mg total) by mouth daily.   fluticasone 50 MCG/ACT nasal spray Commonly known as: FLONASE Place  1-2 sprays into both nostrils daily at 6 (six) AM.   HYDROmorphone 2 MG tablet Commonly known as: DILAUDID Take 1 tablet (2 mg total) by mouth every 6 (six) hours as needed for up to 5 days for moderate pain or severe pain.   meclizine 25 MG tablet Commonly known as: ANTIVERT Take 12.5 mg by mouth 3 (three) times daily as needed for dizziness.   metroNIDAZOLE 500 MG tablet Commonly known as: FLAGYL Take 1 tablet (500 mg total) by mouth every 8 (eight) hours for 5 days.   nitroGLYCERIN 0.4 MG SL tablet Commonly known as: NITROSTAT Place under the tongue.   promethazine 12.5 MG tablet Commonly known as: PHENERGAN Take 1 tablet (12.5 mg total) by mouth every 6 (six) hours as needed.         Allergies  Allergen Reactions  . Cefepime Anaphylaxis, Shortness Of Breath, Swelling and Dermatitis  . Edoxaban  Other (See Comments)    Causes hematomas  . Barium Iodide Rash  . Other     Other reaction(s): Other Preservatives (C-Diff) Other reaction(s): Unknown Preservatives (C-Diff) Other reaction(s): OTHER  . Acetaminophen     Will not take after Cyanide deaths, something changed when they mkt'd again.  Can take Childrens Liquid but sometimes upset stomach too  . Albuterol Hypertension    ABLE TO TOLERATE IN INHALER FORM NOT NEBULIZER   . Aspirin     Makes anemic  . Beta Adrenergic Blockers     Paradoxical reactions  . Diltiazem Other (See Comments)    Patient is intolerant to all CALCIUM CHANNEL BLOCKERS, causes her blood pressure to rise.   . Fentanyl And Related     fever  . Lisinopril     Can only take her own mfr.    . Midazolam Hcl     fever  . Oxycodone   . Prednisone     REACTION: ibs probs  . Propofol     REACTION: FEVER  . Repatha [Evolocumab] Other (See Comments)    Knocked her out for 10 days  . Statins     GI issues  . Sulfamethoxazole-Trimethoprim     REACTION: antibiotic induced colitis  . Lac Bovis Other (See Comments)     The results of  significant diagnostics from this hospitalization (including imaging, microbiology, ancillary and laboratory) are listed below for reference.   Consultations:   Procedures/Studies: CT CHEST W CONTRAST  Result Date: 03/22/2020 CLINICAL DATA:  Cough, shortness of breath, chest tightness and congestion. EXAM: CT CHEST, ABDOMEN, AND PELVIS WITH CONTRAST TECHNIQUE: Multidetector CT imaging of the chest, abdomen and pelvis was performed following the standard protocol during bolus administration of intravenous contrast. CONTRAST:  OMNIPAQUE IOHEXOL 300 MG/ML  SOLN COMPARISON:  December 11, 2016 FINDINGS: CT CHEST FINDINGS Cardiovascular: There is marked severity calcification of the aortic arch. Normal heart size. No pericardial effusion. Mediastinum/Nodes: No enlarged mediastinal, hilar, or axillary lymph nodes. Thyroid gland, trachea, and esophagus demonstrate no significant findings. Lungs/Pleura: Lungs are clear. No pleural effusion or pneumothorax. Musculoskeletal: Multiple sternal wires are seen. Degenerative changes are seen within the thoracic spine. CT ABDOMEN PELVIS FINDINGS Hepatobiliary: A stable 7 mm focus of parenchymal low attenuation is seen within the anteromedial aspect of the right lobe of the liver (axial CT image 46, CT series number 507). No gallstones, gallbladder wall thickening, or biliary dilatation. Pancreas: Unremarkable. No pancreatic ductal dilatation or surrounding inflammatory changes. Spleen: Normal in size without focal abnormality. Adrenals/Urinary Tract: Adrenal glands are unremarkable. Kidneys are normal, without renal calculi, focal lesion, or hydronephrosis. Bladder is unremarkable. Stomach/Bowel: There is a small to moderate sized hiatal hernia. Appendix appears normal. No evidence of bowel wall thickening, distention, or inflammatory changes. Noninflamed diverticula are seen throughout the large bowel. Vascular/Lymphatic: There is marked severity calcification of the  abdominal aorta. No enlarged abdominal or pelvic lymph nodes. Reproductive: Status post hysterectomy. No adnexal masses. Other: No abdominal wall hernia or abnormality. No abdominopelvic ascites. Musculoskeletal: There is grade 1 anterolisthesis of the L4 vertebral body on L5. Mild to moderate severity multilevel degenerative changes are seen throughout the lumbar spine, most prominent at the level of L1-L2. IMPRESSION: 1. Noninflamed diverticula throughout the large bowel. 2. Small to moderate sized hiatal hernia. 3. Stable 7 mm focus of parenchymal low attenuation within the anteromedial aspect of the right lobe of the liver. This may represent a small cyst or hemangioma. 4. Grade 1  anterolisthesis of the L4 vertebral body on L5. 5. Mild to moderate severity multilevel degenerative changes throughout the lumbar spine, most prominent at the level of L1-L2. 6. Aortic atherosclerosis. Aortic Atherosclerosis (ICD10-I70.0). Electronically Signed   By: Aram Candela M.D.   On: 03/22/2020 17:58   CT ANGIO CHEST PE W OR WO CONTRAST  Result Date: 03/25/2020 CLINICAL DATA:  Intermittent shortness of breath.  Headache. EXAM: CT ANGIOGRAPHY CHEST WITH CONTRAST TECHNIQUE: Multidetector CT imaging of the chest was performed using the standard protocol during bolus administration of intravenous contrast. Multiplanar CT image reconstructions and MIPs were obtained to evaluate the vascular anatomy. CONTRAST:  75 mL OMNIPAQUE IOHEXOL 350 MG/ML SOLN COMPARISON:  CT chest, abdomen and pelvis 03/22/2020. FINDINGS: Cardiovascular: Satisfactory opacification of the pulmonary arteries to the segmental level. No evidence of pulmonary embolism. Normal heart size. No pericardial effusion. Atherosclerosis. Status post CABG. Mediastinum/Nodes: No enlarged mediastinal, hilar, or axillary lymph nodes. Thyroid gland, trachea, and esophagus demonstrate no significant findings. Small hiatal hernia noted. Lungs/Pleura: No pleural effusion.  Lungs are clear. There is some emphysematous change in the apices. Upper Abdomen: Negative. Musculoskeletal: No acute or focal bony abnormality. Review of the MIP images confirms the above findings. IMPRESSION: Negative for pulmonary embolus or acute disease. Small hiatal hernia. Aortic Atherosclerosis (ICD10-I70.0) and Emphysema (ICD10-J43.9). Electronically Signed   By: Drusilla Kanner M.D.   On: 03/25/2020 19:44   CT Abdomen Pelvis W Contrast  Addendum Date: 03/30/2020   ADDENDUM REPORT: 03/30/2020 13:35 ADDENDUM: [facility] surgeon Dr. Lady Gary requested a second opinion regarding question of free intraperitoneal air on the original interpretation. There is no evidence of free intraperitoneal air on my scan review. The original report stating "There is evidence for free air" was presumably a dictation error. These addended results were called by telephone at the time of interpretation on 03/30/2020 at 1:35 pm to provider DR. CANNON, who verbally acknowledged these results. Electronically Signed   By: Delbert Phenix M.D.   On: 03/30/2020 13:35   Result Date: 03/30/2020 CLINICAL DATA:  C diff colitis.  Concern for complication. EXAM: CT ABDOMEN AND PELVIS WITH CONTRAST TECHNIQUE: Multidetector CT imaging of the abdomen and pelvis was performed using the standard protocol following bolus administration of intravenous contrast. CONTRAST:  OMNIPAQUE IOHEXOL 300 MG/ML  SOLN COMPARISON:  CT dated March 22, 2020 FINDINGS: Lower chest: The lung bases are clear. The heart size is normal. Hepatobiliary: The liver is normal. Normal gallbladder.There is no biliary ductal dilation. Pancreas: Normal contours without ductal dilatation. No peripancreatic fluid collection. Spleen: Unremarkable. Adrenals/Urinary Tract: --Adrenal glands: Unremarkable. --Right kidney/ureter: No hydronephrosis or radiopaque kidney stones. --Left kidney/ureter: No hydronephrosis or radiopaque kidney stones. --Urinary bladder: Unremarkable.  Stomach/Bowel: --Stomach/Duodenum: Is a moderate-sized hernia --Small bowel: Unremarkable. --Colon: Severe pancolonic diverticulosis is again noted there is some STIR frontal wall thickening of the splenic flexure the colon and portions of the descending colon and transverse colon. There is evidence for free air. There is no adjacent fluid collection. --Appendix: Normal. Vascular/Lymphatic: Atherosclerotic calcification is present within the non-aneurysmal abdominal aorta, without hemodynamically significant stenosis. --No retroperitoneal lymphadenopathy. --No mesenteric lymphadenopathy. --No pelvic or inguinal lymphadenopathy. Reproductive: Status post hysterectomy. No adnexal mass. Other: No ascites or free air. The abdominal wall is normal. Musculoskeletal. There is a new acute fracture through the inferior endplate of the L2 vertebral body. There is no significant associated height loss. Again noted is grade 1 anterolisthesis L4 on L5. IMPRESSION: 1. New acute fracture through the inferior endplate  of the L2 vertebral body. There is no significant associated height loss. 2. Severe pancolonic diverticulosis with findings of acute diverticulitis involving the splenic flexure the colon and portions of the descending colon and transverse colon. There is evidence for free air. No adjacent fluid collection. 3. Moderate-sized hiatal hernia. 4. Pancolonic diverticulosis is again noted. Aortic Atherosclerosis (ICD10-I70.0). Electronically Signed: By: Katherine Mantle M.D. On: 03/29/2020 23:56   CT ABDOMEN PELVIS W CONTRAST  Result Date: 03/22/2020 CLINICAL DATA:  Cough, shortness of breath, chest tightness and congestion. EXAM: CT CHEST, ABDOMEN, AND PELVIS WITH CONTRAST TECHNIQUE: Multidetector CT imaging of the chest, abdomen and pelvis was performed following the standard protocol during bolus administration of intravenous contrast. CONTRAST:  OMNIPAQUE IOHEXOL 300 MG/ML  SOLN COMPARISON:  Feb 21, 2020  FINDINGS: CT CHEST FINDINGS Cardiovascular: There is marked severity calcification of the aortic arch. Normal heart size. No pericardial effusion. Mediastinum/Nodes: No enlarged mediastinal, hilar, or axillary lymph nodes. Thyroid gland, trachea, and esophagus demonstrate no significant findings. Lungs/Pleura: Lungs are clear. No pleural effusion or pneumothorax. Musculoskeletal: Multiple sternal wires are seen. Degenerative changes are seen within the thoracic spine. CT ABDOMEN PELVIS FINDINGS Hepatobiliary: A stable 7 mm focus of parenchymal low attenuation is seen within the anteromedial aspect of the right lobe of the liver (axial CT image 46, CT series number 507). No gallstones, gallbladder wall thickening, or biliary dilatation. Pancreas: Unremarkable. No pancreatic ductal dilatation or surrounding inflammatory changes. Spleen: Normal in size without focal abnormality. Adrenals/Urinary Tract: Adrenal glands are unremarkable. Kidneys are normal, without renal calculi, focal lesion, or hydronephrosis. Bladder is unremarkable. Stomach/Bowel: There is a small to moderate sized hiatal hernia. Appendix appears normal. No evidence of bowel wall thickening, distention, or inflammatory changes. Noninflamed diverticula are seen throughout the large bowel. Vascular/Lymphatic: There is marked severity calcification of the abdominal aorta. No enlarged abdominal or pelvic lymph nodes. Reproductive: Status post hysterectomy. No adnexal masses. Other: No abdominal wall hernia or abnormality. No abdominopelvic ascites. Musculoskeletal: There is grade 1 anterolisthesis of the L4 vertebral body on L5. Mild to moderate severity multilevel degenerative changes are seen throughout the lumbar spine, most prominent at the level of L1-L2. IMPRESSION: 1. Noninflamed diverticula throughout the large bowel. 2. Small to moderate sized hiatal hernia. 3. Stable 7 mm focus of parenchymal low attenuation within the anteromedial aspect of the  right lobe of the liver. This may represent a small cyst or hemangioma. 4. Grade 1 anterolisthesis of the L4 vertebral body on L5. 5. Mild to moderate severity multilevel degenerative changes throughout the lumbar spine, most prominent at the level of L1-L2. 6. Aortic atherosclerosis. Aortic Atherosclerosis (ICD10-I70.0). Electronically Signed   By: Aram Candela M.D.   On: 03/22/2020 17:57   DG Chest Portable 1 View  Result Date: 03/22/2020 CLINICAL DATA:  Allergic reaction.  History of asthma. EXAM: PORTABLE CHEST 1 VIEW COMPARISON:  Chest radiograph 03/22/2020. FINDINGS: Monitoring leads overlie the patient. Stable cardiac and mediastinal contours. No consolidative pulmonary opacities. No pleural effusion or pneumothorax. Thoracic spine degenerative changes. IMPRESSION: No acute cardiopulmonary process. Electronically Signed   By: Annia Belt M.D.   On: 03/22/2020 16:52   DG Chest Portable 1 View  Result Date: 03/22/2020 CLINICAL DATA:  Elevated white blood cell count, hypoxia, sepsis EXAM: PORTABLE CHEST 1 VIEW COMPARISON:  02/21/2020 FINDINGS: Frontal view of the chest demonstrates stable postsurgical changes from CABG. The cardiac silhouette is unremarkable. No airspace disease, effusion, or pneumothorax. No acute bony abnormality. IMPRESSION: 1. No  acute intrathoracic process. Electronically Signed   By: Sharlet Salina M.D.   On: 03/22/2020 15:19   ECHOCARDIOGRAM COMPLETE  Result Date: 03/24/2020    ECHOCARDIOGRAM REPORT   Patient Name:   [name] [facility] Date of Exam: 03/24/2020 Medical Rec #:  [id number]         Height:       63.0 in Accession #:    [id number]        Weight:       144.8 lb Date of Birth:  21-Oct-1955         BSA:          1.686 m Patient Age:    63 years          BP:           163/87 mmHg Patient Gender: F                 HR:           114 bpm. Exam Location:  [facility] Procedure: 2D Echo, Cardiac Doppler and Color Doppler Indications:     Dyspnea 786.09  History:         Patient has  no prior history of Echocardiogram examinations.                  Prior CABG, COPD; Risk Factors:Diabetes. Migraine.  Sonographer:     Cristela Blue RDCS (AE) Referring Phys:  [id number] Vernetta Honey MANSY Diagnosing Phys: Lorine Bears MD IMPRESSIONS  1. Left ventricular ejection fraction, by estimation, is 55 to 60%. The left ventricle has normal function. The left ventricle has no regional wall motion abnormalities. There is moderate left ventricular hypertrophy. Left ventricular diastolic parameters are consistent with Grade I diastolic dysfunction (impaired relaxation).  2. Right ventricular systolic function is normal. The right ventricular size is normal. Tricuspid regurgitation signal is inadequate for assessing PA pressure.  3. The mitral valve is normal in structure. No evidence of mitral valve regurgitation. No evidence of mitral stenosis.  4. The aortic valve is normal in structure. Aortic valve regurgitation is not visualized. No aortic stenosis is present.  5. The inferior vena cava is normal in size with greater than 50% respiratory variability, suggesting right atrial pressure of 3 mmHg. FINDINGS  Left Ventricle: Left ventricular ejection fraction, by estimation, is 55 to 60%. The left ventricle has normal function. The left ventricle has no regional wall motion abnormalities. The left ventricular internal cavity size was normal in size. There is  moderate left ventricular hypertrophy. Left ventricular diastolic parameters are consistent with Grade I diastolic dysfunction (impaired relaxation). Right Ventricle: The right ventricular size is normal. No increase in right ventricular wall thickness. Right ventricular systolic function is normal. Tricuspid regurgitation signal is inadequate for assessing PA pressure. The tricuspid regurgitant velocity is 1.75 m/s, and with an assumed right atrial pressure of 10 mmHg, the estimated right ventricular systolic pressure is 22.2 mmHg. Left Atrium: Left atrial size was  normal in size. Right Atrium: Right atrial size was normal in size. Pericardium: There is no evidence of pericardial effusion. Mitral Valve: The mitral valve is normal in structure. Normal mobility of the mitral valve leaflets. No evidence of mitral valve regurgitation. No evidence of mitral valve stenosis. Tricuspid Valve: The tricuspid valve is normal in structure. Tricuspid valve regurgitation is not demonstrated. No evidence of tricuspid stenosis. Aortic Valve: The aortic valve is normal in structure. Aortic valve regurgitation is not visualized. No aortic stenosis is present. Aortic  valve mean gradient measures 3.0 mmHg. Aortic valve peak gradient measures 6.2 mmHg. Aortic valve area, by VTI measures 3.57 cm. Pulmonic Valve: The pulmonic valve was normal in structure. Pulmonic valve regurgitation is not visualized. No evidence of pulmonic stenosis. Aorta: The aortic root is normal in size and structure. Venous: The inferior vena cava is normal in size with greater than 50% respiratory variability, suggesting right atrial pressure of 3 mmHg. IAS/Shunts: No atrial level shunt detected by color flow Doppler.  LEFT VENTRICLE PLAX 2D LVIDd:         3.14 cm  Diastology LVIDs:         2.10 cm  LV e' lateral:   7.83 cm/s LV PW:         1.48 cm  LV E/e' lateral: 10.8 LV IVS:        1.54 cm  LV e' medial:    5.11 cm/s LVOT diam:     2.00 cm  LV E/e' medial:  16.6 LV SV:         64 LV SV Index:   38 LVOT Area:     3.14 cm  RIGHT VENTRICLE RV Basal diam:  2.57 cm RV S prime:     10.90 cm/s TAPSE (M-mode): 3.0 cm LEFT ATRIUM             Index       RIGHT ATRIUM           Index LA diam:        3.70 cm 2.20 cm/m  RA Area:     14.60 cm LA Vol (A2C):   27.9 ml 16.55 ml/m RA Volume:   34.40 ml  20.41 ml/m LA Vol (A4C):   24.1 ml 14.30 ml/m LA Biplane Vol: 28.7 ml 17.03 ml/m  AORTIC VALVE AV Area (Vmax):    2.89 cm AV Area (Vmean):   3.03 cm AV Area (VTI):     3.57 cm AV Vmax:           125.00 cm/s AV Vmean:           77.100 cm/s AV VTI:            0.178 m AV Peak Grad:      6.2 mmHg AV Mean Grad:      3.0 mmHg LVOT Vmax:         115.00 cm/s LVOT Vmean:        74.400 cm/s LVOT VTI:          0.203 m LVOT/AV VTI ratio: 1.14  AORTA Ao Root diam: 2.80 cm MITRAL VALVE                TRICUSPID VALVE MV Area (PHT): 5.38 cm     TR Peak grad:   12.2 mmHg MV Decel Time: 141 msec     TR Vmax:        175.00 cm/s MV E velocity: 84.90 cm/s MV A velocity: 121.00 cm/s  SHUNTS MV E/A ratio:  0.70         Systemic VTI:  0.20 m                             Systemic Diam: 2.00 cm Lorine Bears MD Electronically signed by Lorine Bears MD Signature Date/Time: 03/24/2020/4:45:32 PM    Final    US Abdomen Limited RUQ  Result Date: 03/26/2020 CLINICAL DATA:  Abdominal pain EXAM: ULTRASOUND ABDOMEN LIMITED RIGHT  UPPER QUADRANT COMPARISON:  CT from 03/22/2020 FINDINGS: Gallbladder: Gallbladder is well distended. No gallstones or gallbladder sludge are seen. Echogenic foci are noted with ring down artifact in the gallbladder wall consistent with adenomyomatosis. Negative sonographic Murphy's sign is elicited. Common bile duct: Diameter: 3.5 mm. Liver: No focal lesion identified. Within normal limits in parenchymal echogenicity. Portal vein is patent on color Doppler imaging with normal direction of blood flow towards the liver. Other: None. IMPRESSION: Adenomyomatosis.  No evidence of cholelithiasis. Hypodensity within the liver on recent CT is not well appreciated on this exam. Electronically Signed   By: Alcide Clever M.D.   On: 03/26/2020 08:12      Labs: BNP (last 3 results) Recent Labs    03/22/20 1419  BNP 255.4*   Basic Metabolic Panel: Recent Labs  Lab 03/25/20 0423 03/26/20 0531 03/29/20 2123 03/31/20 0540  NA 136 131* 131* 132*  K 3.6 3.6 3.4* 3.0*  CL 89* 86* 89* 98  CO2 33* 30 27 28   GLUCOSE 131* 183* 419* 168*  BUN 25* 23 10 7*  CREATININE 1.07* 1.04* 1.01* 0.80  CALCIUM 9.1 8.8* 8.9 7.7*  MG  --   --   --  1.7    Liver Function Tests: Recent Labs  Lab 03/29/20 2123  AST 27  ALT 21  ALKPHOS 98  BILITOT 1.0  PROT 6.8  ALBUMIN 3.7   Recent Labs  Lab 03/29/20 2123  LIPASE 20   No results for input(s): AMMONIA in the last 168 hours. CBC: Recent Labs  Lab 03/25/20 0423 03/26/20 0531 03/29/20 2123 03/31/20 0540  WBC 26.1* 28.1* 40.6* 21.1*  NEUTROABS 19.6* 23.6*  --   --   HGB 15.6* 15.6* 14.4 10.9*  HCT 46.5* 45.9 42.7 32.8*  MCV 85.2 83.5 85.1 84.5  PLT 505* 442* 402* 337   Cardiac Enzymes: No results for input(s): CKTOTAL, CKMB, CKMBINDEX, TROPONINI in the last 168 hours. BNP: Invalid input(s): POCBNP CBG: Recent Labs  Lab 03/30/20 1131 03/30/20 1634 03/30/20 2055 03/31/20 0802 03/31/20 1329  GLUCAP 128* 208* 105* 223* 77   D-Dimer No results for input(s): DDIMER in the last 72 hours. Hgb A1c No results for input(s): HGBA1C in the last 72 hours. Lipid Profile No results for input(s): CHOL, HDL, LDLCALC, TRIG, CHOLHDL, LDLDIRECT in the last 72 hours. Thyroid function studies No results for input(s): TSH, T4TOTAL, T3FREE, THYROIDAB in the last 72 hours.  Invalid input(s): FREET3 Anemia work up No results for input(s): VITAMINB12, FOLATE, FERRITIN, TIBC, IRON, RETICCTPCT in the last 72 hours. Urinalysis    Component Value Date/Time   COLORURINE YELLOW (A) 03/30/2020 0107   APPEARANCEUR CLEAR (A) 03/30/2020 0107   LABSPEC 1.030 03/30/2020 0107   PHURINE 7.0 03/30/2020 0107   GLUCOSEU >=500 (A) 03/30/2020 0107   HGBUR NEGATIVE 03/30/2020 0107   HGBUR trace-lysed 05/22/2010 1343   BILIRUBINUR NEGATIVE 03/30/2020 0107   KETONESUR NEGATIVE 03/30/2020 0107   PROTEINUR NEGATIVE 03/30/2020 0107   UROBILINOGEN 0.2 05/22/2010 1343   NITRITE NEGATIVE 03/30/2020 0107   LEUKOCYTESUR NEGATIVE 03/30/2020 0107   Sepsis Labs Invalid input(s): PROCALCITONIN,  WBC,  LACTICIDVEN Microbiology Recent Results (from the past 240 hour(s))  Culture, blood (Routine x 2)      Status: None   Collection Time: 03/22/20  1:45 PM   Specimen: BLOOD  Result Value Ref Range Status   Specimen Description BLOOD LEFT FOREARM  Final   Special Requests   Final    BOTTLES DRAWN AEROBIC AND ANAEROBIC  Blood Culture adequate volume   Culture   Final    NO GROWTH 5 DAYS Performed at [facility], [address] [place], [place] [address]    Report Status 03/27/2020 FINAL  Final  Culture, blood (Routine x 2)     Status: None   Collection Time: 03/22/20  2:19 PM   Specimen: BLOOD  Result Value Ref Range Status   Specimen Description BLOOD BLOOD LEFT FOREARM  Final   Special Requests   Final    BOTTLES DRAWN AEROBIC AND ANAEROBIC Blood Culture results may not be optimal due to an excessive volume of blood received in culture bottles   Culture   Final    NO GROWTH 5 DAYS Performed at [facility], [address], [place], [place] [address]    Report Status 03/27/2020 FINAL  Final  SARS Coronavirus 2 by RT PCR (hospital order, performed in [facility] hospital lab) Nasopharyngeal Nasopharyngeal Swab     Status: None   Collection Time: 03/22/20  2:19 PM   Specimen: Nasopharyngeal Swab  Result Value Ref Range Status   SARS Coronavirus 2 NEGATIVE NEGATIVE Final    Comment: (NOTE) SARS-CoV-2 target nucleic acids are NOT DETECTED. The SARS-CoV-2 RNA is generally detectable in upper and lower respiratory specimens during the acute phase of infection. The lowest concentration of SARS-CoV-2 viral copies this assay can detect is 250 copies / mL. A negative result does not preclude SARS-CoV-2 infection and should not be used as the sole basis for treatment or other patient management decisions.  A negative result may occur with improper specimen collection / handling, submission of specimen other than nasopharyngeal swab, presence of viral mutation(s) within the areas targeted by this assay, and inadequate number of viral copies (<250 copies / mL). A negative  result must be combined with clinical observations, patient history, and epidemiological information. Fact Sheet for Patients:   StrictlyIdeas.no Fact Sheet for Healthcare Providers: BankingDealers.co.za This test is not yet approved or cleared  by the [place] FDA and has been authorized for detection and/or diagnosis of SARS-CoV-2 by FDA under an Emergency Use Authorization (EUA).  This EUA will remain in effect (meaning this test can be used) for the duration of the COVID-19 declaration under Section 564(b)(1) of the Act, 21 U.S.C. section 360bbb-3(b)(1), unless the authorization is terminated or revoked sooner. Performed at [facility], [address] [place], [place] [address]   Respiratory Panel by PCR     Status: None   Collection Time: 03/23/20  4:37 PM   Specimen: Nasopharyngeal Swab; Respiratory  Result Value Ref Range Status   Adenovirus NOT DETECTED NOT DETECTED Final   Coronavirus 229E NOT DETECTED NOT DETECTED Final    Comment: (NOTE) The Coronavirus on the Respiratory Panel, DOES NOT test for the novel  Coronavirus (2019 nCoV)    Coronavirus HKU1 NOT DETECTED NOT DETECTED Final   Coronavirus NL63 NOT DETECTED NOT DETECTED Final   Coronavirus OC43 NOT DETECTED NOT DETECTED Final   Metapneumovirus NOT DETECTED NOT DETECTED Final   Rhinovirus / Enterovirus NOT DETECTED NOT DETECTED Final   Influenza A NOT DETECTED NOT DETECTED Final   Influenza B NOT DETECTED NOT DETECTED Final   Parainfluenza Virus 1 NOT DETECTED NOT DETECTED Final   Parainfluenza Virus 2 NOT DETECTED NOT DETECTED Final   Parainfluenza Virus 3 NOT DETECTED NOT DETECTED Final   Parainfluenza Virus 4 NOT DETECTED NOT DETECTED Final   Respiratory Syncytial Virus NOT DETECTED NOT DETECTED Final  Bordetella pertussis NOT DETECTED NOT DETECTED Final   Chlamydophila pneumoniae NOT DETECTED NOT DETECTED Final   Mycoplasma pneumoniae NOT  DETECTED NOT DETECTED Final    Comment: Performed at [facility], [address] [place], [place] [address]  MRSA PCR Screening     Status: None   Collection Time: 03/23/20  4:37 PM  Result Value Ref Range Status   MRSA by PCR NEGATIVE NEGATIVE Final    Comment:        The GeneXpert MRSA Assay (FDA approved for NASAL specimens only), is one component of a comprehensive MRSA colonization surveillance program. It is not intended to diagnose MRSA infection nor to guide or monitor treatment for MRSA infections. Performed at [facility], [address] [place], [place] [address]   SARS Coronavirus 2 by RT PCR (hospital order, performed in [facility] hospital lab) Nasopharyngeal Nasopharyngeal Swab     Status: None   Collection Time: 03/30/20  1:07 AM   Specimen: Nasopharyngeal Swab  Result Value Ref Range Status   SARS Coronavirus 2 NEGATIVE NEGATIVE Final    Comment: (NOTE) SARS-CoV-2 target nucleic acids are NOT DETECTED.  The SARS-CoV-2 RNA is generally detectable in upper and lower respiratory specimens during the acute phase of infection. The lowest concentration of SARS-CoV-2 viral copies this assay can detect is 250 copies / mL. A negative result does not preclude SARS-CoV-2 infection and should not be used as the sole basis for treatment or other patient management decisions.  A negative result may occur with improper specimen collection / handling, submission of specimen other than nasopharyngeal swab, presence of viral mutation(s) within the areas targeted by this assay, and inadequate number of viral copies (<250 copies / mL). A negative result must be combined with clinical observations, patient history, and epidemiological information.  Fact Sheet for Patients:   BoilerBrush.com.cyhttps://www.fda.gov/media/136312/download  Fact Sheet for Healthcare Providers: https://pope.com/https://www.fda.gov/media/136313/download  This test is not yet approved or  cleared by the [place] FDA and has been authorized for detection and/or diagnosis of SARS-CoV-2 by FDA under an Emergency Use Authorization (EUA).  This EUA will remain in effect (meaning this test can be used) for the duration of the COVID-19 declaration under Section 564(b)(1) of the Act, 21 U.S.C. section 360bbb-3(b)(1), unless the authorization is terminated or revoked sooner.  Performed at [facility], [address] [place], [place] [address]      Total time spend on discharging this patient, including the last patient exam, discussing the hospital stay, instructions for ongoing care as it relates to all pertinent caregivers, as well as preparing the medical discharge records, prescriptions, and/or referrals as applicable, is 40 minutes.    Darlin Priestlyina Laylaa Guevarra, MD  [facility] 03/31/2020, 1:32 PM  If 7PM-7AM, please contact night-coverage

## 2020-03-31 NOTE — TOC Transition Note (Signed)
Transition of Care [facility]) - CM/SW Discharge Note   Patient Details  Name: [name] MRN: [id number] Date of Birth: 10-25-1955  [facility]) CM/SW Contact:  Su Hilt, RN Phone Number: 03/31/2020, 1:45 PM   Clinical Narrative:    Met with the patient to discuss DN Plan and needs She lives at home with her Husband, she stated that she was just jere a few days ago, she has everything at home that she needs, She stated that she does not need DME and her husband will be picking her up         Patient Goals and CMS Choice        Discharge Placement                       Discharge Plan and Services                                     Social Determinants of Health (SDOH) Interventions     Readmission Risk Interventions No flowsheet data found.

## 2020-04-02 ENCOUNTER — Telehealth: Payer: Self-pay | Admitting: General Practice

## 2020-04-02 NOTE — Telephone Encounter (Signed)
She does not live in [facility] ([name] is in [place]) therefore would not qualify for this clinic regardless of referral

## 2021-09-16 ENCOUNTER — Inpatient Hospital Stay (HOSPITAL_COMMUNITY): Payer: 59 | Admitting: Anesthesiology

## 2021-09-16 ENCOUNTER — Observation Stay (HOSPITAL_COMMUNITY)
Admission: RE | Admit: 2021-09-16 | Discharge: 2021-09-17 | Disposition: A | Discharge status: Home / Self Care | Payer: 59 | Attending: Orthopedic Surgery | Admitting: Orthopedic Surgery

## 2021-09-16 ENCOUNTER — Observation Stay (HOSPITAL_COMMUNITY): Payer: 59

## 2021-09-16 ENCOUNTER — Encounter (HOSPITAL_COMMUNITY)
Admission: RE | Disposition: A | Discharge status: Home / Self Care | Payer: Self-pay | Source: Home / Self Care | Attending: Orthopedic Surgery

## 2021-09-16 ENCOUNTER — Encounter (HOSPITAL_COMMUNITY): Payer: Self-pay | Admitting: Orthopedic Surgery

## 2021-09-16 DIAGNOSIS — S42352A Displaced comminuted fracture of shaft of humerus, left arm, initial encounter for closed fracture: Secondary | ICD-10-CM | POA: Diagnosis present

## 2021-09-16 DIAGNOSIS — J449 Chronic obstructive pulmonary disease, unspecified: Secondary | ICD-10-CM | POA: Diagnosis not present

## 2021-09-16 DIAGNOSIS — J45909 Unspecified asthma, uncomplicated: Secondary | ICD-10-CM | POA: Insufficient documentation

## 2021-09-16 DIAGNOSIS — F17210 Nicotine dependence, cigarettes, uncomplicated: Secondary | ICD-10-CM | POA: Diagnosis not present

## 2021-09-16 DIAGNOSIS — Z20822 Contact with and (suspected) exposure to covid-19: Secondary | ICD-10-CM | POA: Insufficient documentation

## 2021-09-16 DIAGNOSIS — W19XXXA Unspecified fall, initial encounter: Secondary | ICD-10-CM | POA: Insufficient documentation

## 2021-09-16 DIAGNOSIS — Z96612 Presence of left artificial shoulder joint: Secondary | ICD-10-CM

## 2021-09-16 DIAGNOSIS — I10 Essential (primary) hypertension: Secondary | ICD-10-CM | POA: Diagnosis not present

## 2021-09-16 DIAGNOSIS — Z79899 Other long term (current) drug therapy: Secondary | ICD-10-CM | POA: Insufficient documentation

## 2021-09-16 DIAGNOSIS — Z01818 Encounter for other preprocedural examination: Secondary | ICD-10-CM

## 2021-09-16 HISTORY — PX: REVERSE SHOULDER ARTHROPLASTY: SHX5054

## 2021-09-16 LAB — GLUCOSE, CAPILLARY
Glucose-Capillary: 122 mg/dL — ABNORMAL HIGH (ref 70–99)
Glucose-Capillary: 155 mg/dL — ABNORMAL HIGH (ref 70–99)
Glucose-Capillary: 271 mg/dL — ABNORMAL HIGH (ref 70–99)

## 2021-09-16 LAB — SARS CORONAVIRUS 2 BY RT PCR (HOSPITAL ORDER, PERFORMED IN ~~LOC~~ HOSPITAL LAB): SARS Coronavirus 2: NEGATIVE

## 2021-09-16 SURGERY — ARTHROPLASTY, SHOULDER, TOTAL, REVERSE
Anesthesia: General | Site: Shoulder | Laterality: Left

## 2021-09-16 MED ORDER — LIDOCAINE HCL (PF) 2 % IJ SOLN
INTRAMUSCULAR | Status: DC | PRN
  Administered 2021-09-16: 100 mg via INTRADERMAL

## 2021-09-16 MED ORDER — INSULIN ASPART 100 UNIT/ML IJ SOLN
4.0000 [IU] | Freq: Three times a day (TID) | INTRAMUSCULAR | Status: DC
  Administered 2021-09-17: 4 [IU] via SUBCUTANEOUS

## 2021-09-16 MED ORDER — ALBUTEROL SULFATE HFA 108 (90 BASE) MCG/ACT IN AERS: 2.0000 | INHALATION_SPRAY | Freq: Three times a day (TID) | RESPIRATORY_TRACT | Status: DC | PRN

## 2021-09-16 MED ORDER — ONDANSETRON HCL 4 MG/2ML IJ SOLN: 4.0000 mg | Freq: Four times a day (QID) | INTRAMUSCULAR | Status: DC | PRN

## 2021-09-16 MED ORDER — PHENOL 1.4 % MT LIQD: 1.0000 | OROMUCOSAL | Status: DC | PRN

## 2021-09-16 MED ORDER — BUPIVACAINE-EPINEPHRINE (PF) 0.25% -1:200000 IJ SOLN
INTRAMUSCULAR | Status: DC | PRN
  Administered 2021-09-16: 15 mL

## 2021-09-16 MED ORDER — AMLODIPINE BESYLATE 5 MG PO TABS
5.0000 mg | ORAL_TABLET | Freq: Two times a day (BID) | ORAL | Status: DC
  Filled 2021-09-16: qty 1

## 2021-09-16 MED ORDER — ASPIRIN 81 MG PO CHEW
81.0000 mg | CHEWABLE_TABLET | ORAL | Status: DC
  Administered 2021-09-17: 81 mg via ORAL
  Filled 2021-09-16: qty 1

## 2021-09-16 MED ORDER — INSULIN ASPART 100 UNIT/ML IJ SOLN
0.0000 [IU] | Freq: Three times a day (TID) | INTRAMUSCULAR | Status: DC
  Administered 2021-09-17: 2 [IU] via SUBCUTANEOUS

## 2021-09-16 MED ORDER — VITAMIN D (ERGOCALCIFEROL) 1.25 MG (50000 UNIT) PO CAPS: 50000.0000 [IU] | ORAL_CAPSULE | ORAL | Status: DC

## 2021-09-16 MED ORDER — METOCLOPRAMIDE HCL 5 MG/ML IJ SOLN: 5.0000 mg | Freq: Three times a day (TID) | INTRAMUSCULAR | Status: DC | PRN

## 2021-09-16 MED ORDER — 0.9 % SODIUM CHLORIDE (POUR BTL) OPTIME
TOPICAL | Status: DC | PRN
  Administered 2021-09-16: 1000 mL

## 2021-09-16 MED ORDER — BUPIVACAINE LIPOSOME 1.3 % IJ SUSP
INTRAMUSCULAR | Status: DC | PRN
  Administered 2021-09-16: 10 mL via PERINEURAL

## 2021-09-16 MED ORDER — FENTANYL CITRATE PF 50 MCG/ML IJ SOSY
50.0000 ug | PREFILLED_SYRINGE | INTRAMUSCULAR | Status: AC
  Administered 2021-09-16 (×2): 50 ug via INTRAVENOUS
  Filled 2021-09-16: qty 2

## 2021-09-16 MED ORDER — EPHEDRINE 5 MG/ML INJ
INTRAVENOUS | Status: AC
  Filled 2021-09-16: qty 5

## 2021-09-16 MED ORDER — INSULIN ASPART 100 UNIT/ML IJ SOLN
0.0000 [IU] | Freq: Three times a day (TID) | INTRAMUSCULAR | Status: DC
  Administered 2021-09-17: 8 [IU] via SUBCUTANEOUS

## 2021-09-16 MED ORDER — BUPIVACAINE LIPOSOME 1.3 % IJ SUSP: INTRAMUSCULAR | Status: DC | PRN

## 2021-09-16 MED ORDER — LACTATED RINGERS IV SOLN: INTRAVENOUS | Status: DC

## 2021-09-16 MED ORDER — BUPIVACAINE-EPINEPHRINE (PF) 0.25% -1:200000 IJ SOLN
INTRAMUSCULAR | Status: AC
  Filled 2021-09-16: qty 30

## 2021-09-16 MED ORDER — DEXAMETHASONE SODIUM PHOSPHATE 10 MG/ML IJ SOLN
INTRAMUSCULAR | Status: AC
  Filled 2021-09-16: qty 1

## 2021-09-16 MED ORDER — PHENYLEPHRINE 40 MCG/ML (10ML) SYRINGE FOR IV PUSH (FOR BLOOD PRESSURE SUPPORT)
PREFILLED_SYRINGE | INTRAVENOUS | Status: DC | PRN
  Administered 2021-09-16: 80 ug via INTRAVENOUS
  Administered 2021-09-16 (×2): 120 ug via INTRAVENOUS
  Administered 2021-09-16: 80 ug via INTRAVENOUS

## 2021-09-16 MED ORDER — MIDAZOLAM HCL 2 MG/2ML IJ SOLN
1.0000 mg | INTRAMUSCULAR | Status: AC
  Administered 2021-09-16 (×2): 1 mg via INTRAVENOUS
  Filled 2021-09-16: qty 2

## 2021-09-16 MED ORDER — LIDOCAINE HCL (PF) 2 % IJ SOLN
INTRAMUSCULAR | Status: AC
  Filled 2021-09-16: qty 5

## 2021-09-16 MED ORDER — TRANEXAMIC ACID-NACL 1000-0.7 MG/100ML-% IV SOLN
1000.0000 mg | INTRAVENOUS | Status: AC
  Administered 2021-09-16: 1000 mg via INTRAVENOUS

## 2021-09-16 MED ORDER — MENTHOL 3 MG MT LOZG
1.0000 | LOZENGE | OROMUCOSAL | Status: DC | PRN
  Administered 2021-09-17: 3 mg via ORAL
  Filled 2021-09-16: qty 9

## 2021-09-16 MED ORDER — VANCOMYCIN HCL IN DEXTROSE 1-5 GM/200ML-% IV SOLN
1000.0000 mg | INTRAVENOUS | Status: AC
  Administered 2021-09-16: 1000 mg via INTRAVENOUS
  Filled 2021-09-16: qty 200

## 2021-09-16 MED ORDER — PROMETHAZINE HCL 25 MG PO TABS: 25.0000 mg | ORAL_TABLET | Freq: Two times a day (BID) | ORAL | Status: DC | PRN

## 2021-09-16 MED ORDER — METHOCARBAMOL 500 MG PO TABS: 500.0000 mg | ORAL_TABLET | Freq: Three times a day (TID) | ORAL | 1 refills | Status: DC | PRN

## 2021-09-16 MED ORDER — PHENYLEPHRINE HCL-NACL 20-0.9 MG/250ML-% IV SOLN
INTRAVENOUS | Status: DC | PRN
  Administered 2021-09-16: 30 ug/min via INTRAVENOUS

## 2021-09-16 MED ORDER — BUPIVACAINE HCL (PF) 0.5 % IJ SOLN
INTRAMUSCULAR | Status: DC | PRN
  Administered 2021-09-16: 15 mL via PERINEURAL

## 2021-09-16 MED ORDER — OXYCODONE HCL 5 MG PO TABS
5.0000 mg | ORAL_TABLET | ORAL | Status: DC | PRN
  Administered 2021-09-17 (×2): 5 mg via ORAL
  Filled 2021-09-16 (×2): qty 1

## 2021-09-16 MED ORDER — ESMOLOL HCL 100 MG/10ML IV SOLN
INTRAVENOUS | Status: DC | PRN
  Administered 2021-09-16: 30 mg via INTRAVENOUS

## 2021-09-16 MED ORDER — INSULIN ASPART 100 UNIT/ML IJ SOLN
0.0000 [IU] | Freq: Every day | INTRAMUSCULAR | Status: DC
  Administered 2021-09-16: 3 [IU] via SUBCUTANEOUS

## 2021-09-16 MED ORDER — PROMETHAZINE HCL 25 MG RE SUPP
25.0000 mg | Freq: Every day | RECTAL | Status: DC | PRN
  Filled 2021-09-16: qty 1

## 2021-09-16 MED ORDER — SODIUM CHLORIDE 0.9 % IV SOLN: INTRAVENOUS | Status: DC

## 2021-09-16 MED ORDER — DEXAMETHASONE SODIUM PHOSPHATE 10 MG/ML IJ SOLN
INTRAMUSCULAR | Status: DC | PRN
  Administered 2021-09-16: 4 mg via INTRAVENOUS

## 2021-09-16 MED ORDER — METHOCARBAMOL 500 MG IVPB - SIMPLE MED
500.0000 mg | Freq: Four times a day (QID) | INTRAVENOUS | Status: DC | PRN
  Filled 2021-09-16: qty 50

## 2021-09-16 MED ORDER — PROPOFOL 10 MG/ML IV BOLUS
INTRAVENOUS | Status: AC
  Filled 2021-09-16: qty 20

## 2021-09-16 MED ORDER — TRANEXAMIC ACID-NACL 1000-0.7 MG/100ML-% IV SOLN
INTRAVENOUS | Status: AC
  Filled 2021-09-16: qty 100

## 2021-09-16 MED ORDER — METHOCARBAMOL 500 MG PO TABS: 500.0000 mg | ORAL_TABLET | Freq: Four times a day (QID) | ORAL | Status: DC | PRN

## 2021-09-16 MED ORDER — ONDANSETRON HCL 4 MG/2ML IJ SOLN
INTRAMUSCULAR | Status: AC
  Filled 2021-09-16: qty 2

## 2021-09-16 MED ORDER — TRANEXAMIC ACID-NACL 1000-0.7 MG/100ML-% IV SOLN
1000.0000 mg | Freq: Once | INTRAVENOUS | Status: AC
  Administered 2021-09-16: 1000 mg via INTRAVENOUS

## 2021-09-16 MED ORDER — OXYCODONE-ACETAMINOPHEN 5-325 MG PO TABS
1.0000 | ORAL_TABLET | ORAL | Status: DC | PRN
  Administered 2021-09-17 (×2): 1 via ORAL
  Filled 2021-09-16 (×2): qty 1

## 2021-09-16 MED ORDER — HYDROMORPHONE HCL 1 MG/ML IJ SOLN: 0.2500 mg | INTRAMUSCULAR | Status: DC | PRN

## 2021-09-16 MED ORDER — PROPOFOL 10 MG/ML IV BOLUS
INTRAVENOUS | Status: DC | PRN
  Administered 2021-09-16: 120 mg via INTRAVENOUS

## 2021-09-16 MED ORDER — UMECLIDINIUM BROMIDE 62.5 MCG/ACT IN AEPB
1.0000 | INHALATION_SPRAY | Freq: Every day | RESPIRATORY_TRACT | Status: DC
  Filled 2021-09-16: qty 7

## 2021-09-16 MED ORDER — SUGAMMADEX SODIUM 200 MG/2ML IV SOLN
INTRAVENOUS | Status: DC | PRN
  Administered 2021-09-16: 150 mg via INTRAVENOUS

## 2021-09-16 MED ORDER — OXYCODONE-ACETAMINOPHEN 10-325 MG PO TABS: 1.0000 | ORAL_TABLET | ORAL | 0 refills | Status: DC | PRN

## 2021-09-16 MED ORDER — HYDROMORPHONE HCL 1 MG/ML IJ SOLN: 0.5000 mg | INTRAMUSCULAR | Status: DC | PRN

## 2021-09-16 MED ORDER — VANCOMYCIN HCL IN DEXTROSE 1-5 GM/200ML-% IV SOLN: 1000.0000 mg | INTRAVENOUS | Status: DC

## 2021-09-16 MED ORDER — BISACODYL 10 MG RE SUPP: 10.0000 mg | Freq: Every day | RECTAL | Status: DC | PRN

## 2021-09-16 MED ORDER — ARFORMOTEROL TARTRATE 15 MCG/2ML IN NEBU: 15.0000 ug | INHALATION_SOLUTION | Freq: Two times a day (BID) | RESPIRATORY_TRACT | Status: DC

## 2021-09-16 MED ORDER — METOCLOPRAMIDE HCL 5 MG PO TABS: 5.0000 mg | ORAL_TABLET | Freq: Three times a day (TID) | ORAL | Status: DC | PRN

## 2021-09-16 MED ORDER — FENTANYL CITRATE (PF) 100 MCG/2ML IJ SOLN
INTRAMUSCULAR | Status: AC
  Filled 2021-09-16: qty 2

## 2021-09-16 MED ORDER — BUPIVACAINE HCL (PF) 0.5 % IJ SOLN: INTRAMUSCULAR | Status: DC | PRN

## 2021-09-16 MED ORDER — ROCURONIUM BROMIDE 10 MG/ML (PF) SYRINGE
PREFILLED_SYRINGE | INTRAVENOUS | Status: DC | PRN
  Administered 2021-09-16: 70 mg via INTRAVENOUS

## 2021-09-16 MED ORDER — ONDANSETRON HCL 4 MG/2ML IJ SOLN
INTRAMUSCULAR | Status: DC | PRN
  Administered 2021-09-16: 4 mg via INTRAVENOUS

## 2021-09-16 MED ORDER — ROCURONIUM BROMIDE 10 MG/ML (PF) SYRINGE
PREFILLED_SYRINGE | INTRAVENOUS | Status: AC
  Filled 2021-09-16: qty 10

## 2021-09-16 MED ORDER — DOCUSATE SODIUM 100 MG PO CAPS
100.0000 mg | ORAL_CAPSULE | Freq: Two times a day (BID) | ORAL | Status: DC
  Administered 2021-09-16 – 2021-09-17 (×2): 100 mg via ORAL
  Filled 2021-09-16 (×2): qty 1

## 2021-09-16 MED ORDER — CHLORHEXIDINE GLUCONATE 0.12 % MT SOLN
15.0000 mL | Freq: Once | OROMUCOSAL | Status: AC
  Administered 2021-09-16: 15 mL via OROMUCOSAL

## 2021-09-16 MED ORDER — ONDANSETRON HCL 4 MG/2ML IJ SOLN: 4.0000 mg | Freq: Once | INTRAMUSCULAR | Status: DC | PRN

## 2021-09-16 MED ORDER — ONDANSETRON HCL 4 MG PO TABS: 4.0000 mg | ORAL_TABLET | Freq: Four times a day (QID) | ORAL | Status: DC | PRN

## 2021-09-16 MED ORDER — VANCOMYCIN HCL 1000 MG IV SOLR
INTRAVENOUS | Status: AC
  Filled 2021-09-16: qty 20

## 2021-09-16 MED ORDER — VANCOMYCIN HCL 1000 MG/200ML IV SOLN
1000.0000 mg | Freq: Once | INTRAVENOUS | Status: AC
  Administered 2021-09-17: 1000 mg via INTRAVENOUS
  Filled 2021-09-16: qty 200

## 2021-09-16 MED ORDER — OXYCODONE-ACETAMINOPHEN 10-325 MG PO TABS: 1.0000 | ORAL_TABLET | ORAL | Status: DC | PRN

## 2021-09-16 MED ORDER — MIDAZOLAM HCL 2 MG/2ML IJ SOLN
INTRAMUSCULAR | Status: AC
  Filled 2021-09-16: qty 2

## 2021-09-16 MED ORDER — STERILE WATER FOR IRRIGATION IR SOLN
Status: DC | PRN
  Administered 2021-09-16: 2000 mL

## 2021-09-16 MED ORDER — FENTANYL CITRATE (PF) 100 MCG/2ML IJ SOLN
INTRAMUSCULAR | Status: DC | PRN
  Administered 2021-09-16: 50 ug via INTRAVENOUS

## 2021-09-16 SURGICAL SUPPLY — 74 items
AID PSTN UNV HD RSTRNT DISP (MISCELLANEOUS) ×1
BAG COUNTER SPONGE SURGICOUNT (BAG) ×1 IMPLANT
BAG SPEC THK2 15X12 ZIP CLS (MISCELLANEOUS)
BAG SPNG CNTER NS LX DISP (BAG) ×1
BAG ZIPLOCK 12X15 (MISCELLANEOUS) IMPLANT
BIT DRILL 1.6MX128 (BIT) IMPLANT
BIT DRILL 170X2.5X (BIT) IMPLANT
BIT DRL 170X2.5X (BIT) ×1
BLADE SAG 18X100X1.27 (BLADE) ×2 IMPLANT
CEMENT HV SMART SET (Cement) ×1 IMPLANT
COVER BACK TABLE 60X90IN (DRAPES) ×2 IMPLANT
COVER SURGICAL LIGHT HANDLE (MISCELLANEOUS) ×2 IMPLANT
DECANTER SPIKE VIAL GLASS SM (MISCELLANEOUS) ×1 IMPLANT
DRAPE INCISE IOBAN 66X45 STRL (DRAPES) ×2 IMPLANT
DRAPE ORTHO SPLIT 77X108 STRL (DRAPES) ×4
DRAPE SHEET LG 3/4 BI-LAMINATE (DRAPES) ×2 IMPLANT
DRAPE SURG ORHT 6 SPLT 77X108 (DRAPES) ×2 IMPLANT
DRAPE TOP 10253 STERILE (DRAPES) ×2 IMPLANT
DRAPE U-SHAPE 47X51 STRL (DRAPES) ×2 IMPLANT
DRILL 2.5 (BIT) ×2
DRSG ADAPTIC 3X8 NADH LF (GAUZE/BANDAGES/DRESSINGS) ×2 IMPLANT
DRSG PAD ABDOMINAL 8X10 ST (GAUZE/BANDAGES/DRESSINGS) ×2 IMPLANT
DURAPREP 26ML APPLICATOR (WOUND CARE) ×2 IMPLANT
ELECT BLADE TIP CTD 4 INCH (ELECTRODE) ×2 IMPLANT
ELECT NDL TIP 2.8 STRL (NEEDLE) ×1 IMPLANT
ELECT NEEDLE TIP 2.8 STRL (NEEDLE) ×2 IMPLANT
ELECT REM PT RETURN 15FT ADLT (MISCELLANEOUS) ×2 IMPLANT
FACESHIELD WRAPAROUND (MASK) ×2 IMPLANT
FACESHIELD WRAPAROUND OR TEAM (MASK) ×1 IMPLANT
GAUZE SPONGE 4X4 12PLY STRL (GAUZE/BANDAGES/DRESSINGS) ×2 IMPLANT
GLENOSPHERE DELTA XTEND LAT 38 (Miscellaneous) ×1 IMPLANT
GLOVE SURG ORTHO LTX SZ7.5 (GLOVE) ×2 IMPLANT
GLOVE SURG ORTHO LTX SZ8.5 (GLOVE) ×2 IMPLANT
GLOVE SURG UNDER POLY LF SZ7.5 (GLOVE) ×2 IMPLANT
GLOVE SURG UNDER POLY LF SZ8.5 (GLOVE) ×2 IMPLANT
GOWN STRL REUS W/TWL XL LVL3 (GOWN DISPOSABLE) ×4 IMPLANT
KIT BASIN OR (CUSTOM PROCEDURE TRAY) ×2 IMPLANT
KIT TURNOVER KIT A (KITS) IMPLANT
MANIFOLD NEPTUNE II (INSTRUMENTS) ×2 IMPLANT
METAGLENE DELTA EXTEND (Trauma) IMPLANT
METAGLENE DXTEND (Trauma) ×2 IMPLANT
NDL MAYO CATGUT SZ4 TPR NDL (NEEDLE) IMPLANT
NEEDLE MAYO CATGUT SZ4 (NEEDLE) IMPLANT
NS IRRIG 1000ML POUR BTL (IV SOLUTION) ×2 IMPLANT
PACK SHOULDER (CUSTOM PROCEDURE TRAY) ×2 IMPLANT
PIN GUIDE 1.2 (PIN) ×1 IMPLANT
PIN GUIDE GLENOPHERE 1.5MX300M (PIN) ×1 IMPLANT
PIN METAGLENE 2.5 (PIN) ×1 IMPLANT
PROTECTOR NERVE ULNAR (MISCELLANEOUS) ×1 IMPLANT
RESTRAINT HEAD UNIVERSAL NS (MISCELLANEOUS) ×2 IMPLANT
SCREW 4.5X36MM (Screw) ×1 IMPLANT
SCREW LOCK DELTA XTEND 4.5X30 (Screw) ×1 IMPLANT
SLING ARM FOAM STRAP LRG (SOFTGOODS) IMPLANT
SLING ARM FOAM STRAP MED (SOFTGOODS) ×1 IMPLANT
SMARTMIX MINI TOWER (MISCELLANEOUS) ×2
SPACER 38 PLUS 3 (Spacer) ×1 IMPLANT
SPONGE T-LAP 18X18 ~~LOC~~+RFID (SPONGE) ×1 IMPLANT
SPONGE T-LAP 4X18 ~~LOC~~+RFID (SPONGE) ×1 IMPLANT
STAPLER VISISTAT 35W (STAPLE) ×1 IMPLANT
STEM DELTA DIA 10 HA (Stem) ×1 IMPLANT
STEM EPIPHYSIS SHOULDER (Stem) ×1 IMPLANT
STRIP CLOSURE SKIN 1/2X4 (GAUZE/BANDAGES/DRESSINGS) ×2 IMPLANT
SUCTION FRAZIER HANDLE 10FR (MISCELLANEOUS) ×2
SUCTION TUBE FRAZIER 10FR DISP (MISCELLANEOUS) ×1 IMPLANT
SUT FIBERWIRE #2 38 T-5 BLUE (SUTURE) ×12
SUT MNCRL AB 4-0 PS2 18 (SUTURE) ×2 IMPLANT
SUT VIC AB 0 CT1 36 (SUTURE) ×4 IMPLANT
SUT VIC AB 0 CT2 27 (SUTURE) ×2 IMPLANT
SUT VIC AB 2-0 CT1 27 (SUTURE) ×2
SUT VIC AB 2-0 CT1 TAPERPNT 27 (SUTURE) ×1 IMPLANT
SUTURE FIBERWR #2 38 T-5 BLUE (SUTURE) ×2 IMPLANT
TAPE CLOTH SURG 6X10 WHT LF (GAUZE/BANDAGES/DRESSINGS) ×1 IMPLANT
TOWEL OR 17X26 10 PK STRL BLUE (TOWEL DISPOSABLE) ×2 IMPLANT
TOWER SMARTMIX MINI (MISCELLANEOUS) IMPLANT

## 2021-09-16 NOTE — Anesthesia Procedure Notes (Signed)
Anesthesia Regional Block: Interscalene brachial plexus block   Pre-Anesthetic Checklist: , timeout performed,  Correct Patient, Correct Site, Correct Laterality,  Correct Procedure, Correct Position, site marked,  Risks and benefits discussed,  Surgical consent,  Pre-op evaluation,  At surgeon's request and post-op pain management  Laterality: Left  Prep: chloraprep       Needles:  Injection technique: Single-shot  Needle Type: Echogenic Stimulator Needle     Needle Length: 5cm  Needle Gauge: 22     Additional Needles:   Narrative:  Start time: 09/16/2021 3:34 PM End time: 09/16/2021 3:44 PM Injection made incrementally with aspirations every 5 mL.  Performed by: Personally  Anesthesiologist: Heather Roberts, MD  Additional Notes: Functioning IV was confirmed and monitors applied.  A 61mm 22ga echogenic arrow stimulator was used. Sterile prep and drape,hand hygiene and sterile gloves were used.Ultrasound guidance: relevant anatomy identified, needle position confirmed, local anesthetic spread visualized around nerve(s)., vascular puncture avoided.  Image printed for medical record.  Negative aspiration and negative test dose prior to incremental administration of local anesthetic. The patient tolerated the procedure well.

## 2021-09-16 NOTE — Discharge Instructions (Signed)
Ice to the shoulder constantly.  Keep the incision covered and clean and dry for one week, then ok to get it wet in the shower. ° °Do exercise as instructed several times per day. ° °DO NOT reach behind your back or push up out of a chair with the operative arm. ° °Use a sling while you are up and around for comfort, may remove while seated.  Keep pillow propped behind the operative elbow. ° °Follow up with Dr Simmie Camerer in two weeks in the office, call [phone] for appt °

## 2021-09-16 NOTE — Anesthesia Postprocedure Evaluation (Signed)
Anesthesia Post Note  Patient: [name]  Procedure(s) Performed: REVERSE SHOULDER ARTHROPLASTY (Left: Shoulder)     Patient location during evaluation: PACU Anesthesia Type: General Level of consciousness: awake and alert Pain management: pain level controlled Vital Signs Assessment: post-procedure vital signs reviewed and stable Respiratory status: spontaneous breathing, nonlabored ventilation, respiratory function stable and patient connected to nasal cannula oxygen Cardiovascular status: blood pressure returned to baseline and stable Postop Assessment: no apparent nausea or vomiting Anesthetic complications: no   No notable events documented.  Last Vitals:  Vitals:   09/16/21 2015 09/16/21 2030  BP: 114/60 118/62  Pulse: 87 89  Resp: 19 17  Temp:    SpO2: 92%     Last Pain:  Vitals:   09/16/21 2030  TempSrc:   PainSc: 0-No pain                 Randen Kauth S

## 2021-09-16 NOTE — H&P (Signed)
[name]'s anticipated LOS is less than 2 midnights, meeting these requirements: - Younger than 57 - Lives within 1 hour of care - Has a competent adult at home to recover with post-op recover - NO history of  - Chronic pain requiring opiods  - Diabetes  - Coronary Artery Disease  - Heart failure  - Heart attack  - Stroke  - DVT/VTE  - Cardiac arrhythmia  - Respiratory Failure/COPD  - Renal failure  - Anemia  - Advanced Liver disease     [name] is an 65 y.o. female.    Chief Complaint: left shoulder pain  HPI: Pt is a 65 y.o. female complaining of left shoulder pain after recent fall.  Pain had continually increased since the beginning. X-rays in the clinic show proximal humerus fracture with displacement. Risks, benefits and expectations were discussed with the [name]. [name] understand the risks, benefits and expectations and wishes to proceed with surgery.   PCP:  [name], No Pcp Per (Inactive)  D/C Plans: Home  PMH: Past Medical History:  Diagnosis Date   Asthma    Benign essential HTN    COPD (chronic obstructive pulmonary disease) (HCC)    IBS (irritable bowel syndrome)    Migraine headache     PSH: No past surgical history on file.  Social History:  reports that she has been smoking cigarettes. She has been smoking an average of 1 pack per day. She has never used smokeless tobacco. She reports current alcohol use. She reports that she does not use drugs.  Allergies:  Allergies  Allergen Reactions   Cefepime Anaphylaxis, Shortness Of Breath, Swelling and Dermatitis   Edoxaban Other (See Comments)    Causes hematomas   Barium Iodide Rash   Other     Other reaction(s): Other Preservatives  (C-Diff) Other reaction(s): Unknown Preservatives  (C-Diff) Other reaction(s): OTHER   Acetaminophen     Will not take after Cyanide deaths, something changed when they mkt'd again.  Can take Childrens Liquid but sometimes upset stomach too   Albuterol  Hypertension    ABLE TO TOLERATE IN INHALER FORM NOT NEBULIZER    Aspirin     Makes anemic   Beta Adrenergic Blockers     Paradoxical reactions   Diltiazem Other (See Comments)    [name] is intolerant to all CALCIUM CHANNEL BLOCKERS, causes her blood pressure to rise.    Fentanyl And Related     fever   Lisinopril     Can only take her own mfr.     Midazolam Hcl     fever   Oxycodone    Prednisone     REACTION: ibs probs   Propofol     REACTION: FEVER   Repatha [Evolocumab] Other (See Comments)    Knocked her out for 10 days   Statins     GI issues   Sulfamethoxazole-Trimethoprim     REACTION: antibiotic induced colitis   Lac Bovis Other (See Comments)    Medications: No current facility-administered medications for this encounter.   Current Outpatient Medications  Medication Sig Dispense Refill   albuterol (PROVENTIL HFA;VENTOLIN HFA) 108 (90 BASE) MCG/ACT inhaler Inhale 2 puffs into the lungs 3 (three) times daily as needed for wheezing or shortness of breath.     amLODipine (NORVASC) 5 MG tablet Take 1 tablet (5 mg total) by mouth 2 (two) times daily. 60 tablet 0   aspirin 81 MG chewable tablet Chew 81 mg by mouth daily.  diphenoxylate-atropine (LOMOTIL) 2.5-0.025 MG per tablet Take 1-2 tablets by mouth 3 (three) times daily as needed for diarrhea or loose stools (for bowel spasms).     EPINEPHrine (EPIPEN IJ) Inject 0.3 mg as directed as needed (Bee Stings).     famotidine (PEPCID) 20 MG tablet Take 1 tablet (20 mg total) by mouth daily. 30 tablet 0   fluticasone (FLONASE) 50 MCG/ACT nasal spray Place 1-2 sprays into both nostrils daily at 6 (six) AM.     meclizine (ANTIVERT) 25 MG tablet Take 12.5 mg by mouth 3 (three) times daily as needed for dizziness.      nitroGLYCERIN (NITROSTAT) 0.4 MG SL tablet Place under the tongue.     promethazine (PHENERGAN) 12.5 MG tablet Take 1 tablet (12.5 mg total) by mouth every 6 (six) hours as needed. 12 tablet 0    No results  found for this or any previous visit (from the past 48 hour(s)). No results found.  ROS: Pain with rom of the left upper extremity  Physical Exam: Alert and oriented 65 y.o. female in no acute distress Cranial nerves 2-12 intact Cervical spine: full rom with no tenderness, nv intact distally Chest: active breath sounds bilaterally, no wheeze rhonchi or rales Heart: regular rate and rhythm, no murmur Abd: non tender non distended with active bowel sounds Hip is stable with rom  Left shoulder with edema and ecchymosis  Nv intact distally Unable to tolerate any rom due to fracture  Assessment/Plan Assessment: left proximal humerus fracture  Plan:  [name] will undergo a left reverse total shoulder by Dr. Ranell Patrick at [facility] Risks benefits and expectations were discussed with the [name]. [name] understand risks, benefits and expectations and wishes to proceed. Preoperative templating of the joint replacement has been completed, documented, and submitted to the Operating Room personnel in order to optimize intra-operative equipment management.   Alphonsa Overall PA-C, MPAS [facility] is now [employer] [address], [place], [place] [address] Phone: [phone] [email or web address] Facebook  [employer]

## 2021-09-16 NOTE — Transfer of Care (Signed)
Immediate Anesthesia Transfer of Care Note  Patient: [name]  Procedure(s) Performed: REVERSE SHOULDER ARTHROPLASTY (Left: Shoulder)  Patient Location: PACU  Anesthesia Type:General  Level of Consciousness: awake, alert , oriented and patient cooperative  Airway & Oxygen Therapy: Patient Spontanous Breathing and Patient connected to face mask oxygen  Post-op Assessment: Report given to RN, Post -op Vital signs reviewed and stable and Patient moving all extremities X 4  Post vital signs: stable  Last Vitals:  Vitals Value Taken Time  BP 108/69 09/16/21 1937  Temp    Pulse 88 09/16/21 1943  Resp 15 09/16/21 1943  SpO2 96 % 09/16/21 1943  Vitals shown include unvalidated device data.  Last Pain:  Vitals:   09/16/21 1353  TempSrc:   PainSc: 8       Patients Stated Pain Goal: 7 (09/16/21 1353)  Complications: No notable events documented.

## 2021-09-16 NOTE — Brief Op Note (Signed)
09/16/2021  7:21 PM  PATIENT:  [name]  65 y.o. female  PRE-OPERATIVE DIAGNOSIS:  LEFT SHOULDER FRACTURE, comminuted and displaced  POST-OPERATIVE DIAGNOSIS:  LEFT SHOULDER FRACTURE, comminuted and displaced   PROCEDURE:  Procedure(s): REVERSE SHOULDER ARTHROPLASTY (Left) DePuy delta Xtend with tuberosity repair  SURGEON:  Surgeon(s) and Role:    Beverely Low, MD - Primary  PHYSICIAN ASSISTANT:   ASSISTANTS: Thea Gist, PA-C   ANESTHESIA:   regional and general  EBL:  150 mL   BLOOD ADMINISTERED:none  DRAINS: none   LOCAL MEDICATIONS USED:  MARCAINE     SPECIMEN:  No Specimen  DISPOSITION OF SPECIMEN:  N/A  COUNTS:  YES  TOURNIQUET:  * No tourniquets in log *  DICTATION: .Other Dictation: Dictation Number [id number]  PLAN OF CARE: Admit for overnight observation  PATIENT DISPOSITION:  PACU - hemodynamically stable.   Delay start of Pharmacological VTE agent (>24hrs) due to surgical blood loss or risk of bleeding: not applicable

## 2021-09-16 NOTE — Interval H&P Note (Signed)
History and Physical Interval Note:  09/16/2021 4:33 PM  [name]  has presented today for surgery, with the diagnosis of LEFT SHOULDER FRACTURE.  The various methods of treatment have been discussed with the patient and family. After consideration of risks, benefits and other options for treatment, the patient has consented to  Procedure(s): REVERSE SHOULDER ARTHROPLASTY (Left) as a surgical intervention.  The patient's history has been reviewed, patient examined, no change in status, stable for surgery.  I have reviewed the patient's chart and labs.  Questions were answered to the patient's satisfaction.     [name]

## 2021-09-16 NOTE — Progress Notes (Signed)
Assisted Dr. Heather Roberts with Left Interscalene Brachial Plexus block. Side rails up, monitors on throughout procedure. See vital signs in flow sheet. Tolerated Procedure well.

## 2021-09-16 NOTE — Anesthesia Preprocedure Evaluation (Addendum)
Anesthesia Evaluation  Patient identified by MRN, date of birth, ID band Patient awake    Reviewed: Allergy & Precautions, H&P , NPO status , Patient's Chart, lab work & pertinent test results  History of Anesthesia Complications Negative for: history of anesthetic complications  Airway Mallampati: I  TM Distance: >3 FB Neck ROM: Full    Dental  (+) Edentulous Upper, Dental Advisory Given   Pulmonary COPD,  COPD inhaler, Current Smoker,    Pulmonary exam normal        Cardiovascular hypertension, Pt. on medications (-) angina+ CAD, + CABG and + Peripheral Vascular Disease  Normal cardiovascular exam   A echocardiographic report from June of last year demonstrated normal left ventricular wall motion with LVEF 55-60%. No significant valvular abnormalities were identified on that study.   Neuro/Psych  Headaches, PSYCHIATRIC DISORDERS Anxiety Depression    GI/Hepatic Neg liver ROS,   Endo/Other  negative endocrine ROS  Renal/GU negative Renal ROS  negative genitourinary   Musculoskeletal negative musculoskeletal ROS (+)   Abdominal   Peds negative pediatric ROS (+)  Hematology negative hematology ROS (+)   Anesthesia Other Findings   Reproductive/Obstetrics negative OB ROS                           Anesthesia Physical Anesthesia Plan  ASA: 3  Anesthesia Plan: General   Post-op Pain Management: Regional block   Induction:   PONV Risk Score and Plan: 2 and Ondansetron, Dexamethasone, Midazolam and Scopolamine patch - Pre-op  Airway Management Planned: Oral ETT  Additional Equipment:   Intra-op Plan:   Post-operative Plan: Extubation in OR  Informed Consent: I have reviewed the patients History and Physical, chart, labs and discussed the procedure including the risks, benefits and alternatives for the proposed anesthesia with the patient or authorized representative who has indicated  his/her understanding and acceptance.     Dental advisory given  Plan Discussed with: Anesthesiologist and CRNA  Anesthesia Plan Comments: (Pt refuses Tylenol po and celebrex.)       Anesthesia Quick Evaluation

## 2021-09-16 NOTE — Anesthesia Procedure Notes (Signed)
Procedure Name: Intubation Date/Time: 09/16/2021 5:20 PM Performed by: Ezekiel Ina, CRNA Pre-anesthesia Checklist: Patient identified, Emergency Drugs available, Suction available and Patient being monitored Patient Re-evaluated:Patient Re-evaluated prior to induction Oxygen Delivery Method: Circle system utilized Preoxygenation: Pre-oxygenation with 100% oxygen Induction Type: IV induction Ventilation: Mask ventilation without difficulty Laryngoscope Size: Miller and 2 Grade View: Grade I Tube type: Oral Tube size: 7.0 mm Number of attempts: 1 Airway Equipment and Method: Stylet Placement Confirmation: ETT inserted through vocal cords under direct vision, positive ETCO2 and breath sounds checked- equal and bilateral Secured at: 20 cm Tube secured with: Tape Dental Injury: Teeth and Oropharynx as per pre-operative assessment

## 2021-09-17 ENCOUNTER — Other Ambulatory Visit: Payer: Self-pay

## 2021-09-17 ENCOUNTER — Encounter (HOSPITAL_COMMUNITY): Payer: Self-pay | Admitting: Orthopedic Surgery

## 2021-09-17 DIAGNOSIS — S42352A Displaced comminuted fracture of shaft of humerus, left arm, initial encounter for closed fracture: Secondary | ICD-10-CM | POA: Diagnosis not present

## 2021-09-17 LAB — BASIC METABOLIC PANEL
Anion gap: 7 (ref 5–15)
BUN: 17 mg/dL (ref 8–23)
CO2: 26 mmol/L (ref 22–32)
Calcium: 8.5 mg/dL — ABNORMAL LOW (ref 8.9–10.3)
Chloride: 98 mmol/L (ref 98–111)
Creatinine, Ser: 0.87 mg/dL (ref 0.44–1.00)
GFR, Estimated: >60 mL/min (ref >60)
Glucose, Bld: 208 mg/dL — ABNORMAL HIGH (ref 70–99)
Potassium: 4.2 mmol/L (ref 3.5–5.1)
Sodium: 131 mmol/L — ABNORMAL LOW (ref 135–145)

## 2021-09-17 LAB — HEMOGLOBIN AND HEMATOCRIT, BLOOD
HCT: 32.5 % — ABNORMAL LOW (ref 36.0–46.0)
Hemoglobin: 10.5 g/dL — ABNORMAL LOW (ref 12.0–15.0)

## 2021-09-17 LAB — GLUCOSE, CAPILLARY: Glucose-Capillary: 251 mg/dL — ABNORMAL HIGH (ref 70–99)

## 2021-09-17 MED ORDER — PROMETHAZINE HCL 25 MG PO TABS: 25.0000 mg | ORAL_TABLET | Freq: Four times a day (QID) | ORAL | 0 refills | Status: DC | PRN

## 2021-09-17 NOTE — Progress Notes (Signed)
Orthopedics Progress Note  Subjective: Patient comfortable this AM  Objective:  Vitals:   09/17/21 0154 09/17/21 0505  BP: 129/63 (!) 138/49  Pulse: 90 91  Resp: 18 17  Temp: 98.4 F (36.9 C) 98.3 F (36.8 C)  SpO2: 95% 93%    General: Awake and alert  Musculoskeletal: Left shoulder dressing CDI Neurovascularly intact  Lab Results  Component Value Date   WBC 21.1 (H) 03/31/2020   HGB 10.5 (L) 09/17/2021   HCT 32.5 (L) 09/17/2021   MCV 84.5 03/31/2020   PLT 337 03/31/2020       Component Value Date/Time   NA 131 (L) 09/17/2021 0323   K 4.2 09/17/2021 0323   CL 98 09/17/2021 0323   CO2 26 09/17/2021 0323   GLUCOSE 208 (H) 09/17/2021 0323   BUN 17 09/17/2021 0323   CREATININE 0.87 09/17/2021 0323   CALCIUM 8.5 (L) 09/17/2021 0323   GFRNONAA >60 09/17/2021 0323   GFRAA >60 03/31/2020 0540    Lab Results  Component Value Date   INR 1.0 03/30/2020   INR 1.0 03/22/2020    Assessment/Plan: POD #1 s/p Procedure(s): REVERSE SHOULDER ARTHROPLASTY Stable this AM. Plan discharge to home after therapy Follow up in two weeks  Almedia Balls. Ranell Patrick, MD 09/17/2021 8:04 AM

## 2021-09-17 NOTE — Discharge Summary (Signed)
In most cases prophylactic antibiotics for Dental procdeures after total joint surgery are not necessary.  Exceptions are as follows:  1. History of prior total joint infection  2. Severely immunocompromised (Organ Transplant, cancer chemotherapy, Rheumatoid biologic meds such as Humera)  3. Poorly controlled diabetes (A1C &gt; 8.0, blood glucose over 200)  If you have one of these conditions, contact your surgeon for an antibiotic prescription, prior to your dental procedure. Orthopedic Discharge Summary        Physician Discharge Summary  Patient ID: [name] MRN: [id number] DOB/AGE: Mar 29, 1956 65 y.o.  Admit date: 09/16/2021 Discharge date: 09/17/2021   Procedures:  Procedure(s) (LRB): REVERSE SHOULDER ARTHROPLASTY (Left)  Attending Physician:  Dr. Malon KindleSteven Ernst Rios  Admission Diagnoses:   left shoulder humerus fracture  Discharge Diagnoses:  same   Past Medical History:  Diagnosis Date   Asthma    Benign essential HTN    COPD (chronic obstructive pulmonary disease) (HCC)    IBS (irritable bowel syndrome)    Migraine headache     PCP: Patient, No Pcp Per (Inactive)   Discharged Condition: good  Hospital Course:  Patient underwent the above stated procedure on 09/16/2021. Patient tolerated the procedure well and brought to the recovery room in good condition and subsequently to the floor. Patient had an uncomplicated hospital course and was stable for discharge.   Disposition: Discharge disposition: 01-Home or Self Care      with follow up in 2 weeks    Follow-up Information     Kayla LowNorris, Kayla Gindlesperger, MD. Call in 2 week(s).   Specialty: Orthopedic Surgery Why: call [phone] for appt in two weeks Contact information: [address] [place] [address] [phone]                 Dental Antibiotics:  In most cases prophylactic antibiotics for Dental procdeures after total joint surgery are not  necessary.  Exceptions are as follows:  1. History of prior total joint infection  2. Severely immunocompromised (Organ Transplant, cancer chemotherapy, Rheumatoid biologic meds such as Humera)  3. Poorly controlled diabetes (A1C &gt; 8.0, blood glucose over 200)  If you have one of these conditions, contact your surgeon for an antibiotic prescription, prior to your dental procedure.  Discharge Instructions     Call MD / Call 911   Complete by: As directed    If you experience chest pain or shortness of breath, CALL 911 and be transported to the hospital emergency room.  If you develope a fever above 101 F, pus (white drainage) or increased drainage or redness at the wound, or calf pain, call your surgeon's office.   Constipation Prevention   Complete by: As directed    Drink plenty of fluids.  Prune juice may be helpful.  You may use a stool softener, such as Colace (over the counter) 100 mg twice a day.  Use MiraLax (over the counter) for constipation as needed.   Diet - low sodium heart healthy   Complete by: As directed    Increase activity slowly as tolerated   Complete by: As directed    Post-operative opioid taper instructions:   Complete by: As directed    POST-OPERATIVE OPIOID TAPER INSTRUCTIONS: It is important to wean off of your opioid medication as soon as possible. If you do not need pain medication after your surgery it is ok to stop day one. Opioids include: Codeine, Hydrocodone(Norco, Vicodin), Oxycodone(Percocet, oxycontin) and hydromorphone amongst others.  Long term  and even short term use of opiods can cause: Increased pain response Dependence Constipation Depression Respiratory depression And more.  Withdrawal symptoms can include Flu like symptoms Nausea, vomiting And more Techniques to manage these symptoms Hydrate well Eat regular healthy meals Stay active Use relaxation techniques(deep breathing, meditating, yoga) Do Not substitute Alcohol to  help with tapering If you have been on opioids for less than two weeks and do not have pain than it is ok to stop all together.  Plan to wean off of opioids This plan should start within one week post op of your joint replacement. Maintain the same interval or time between taking each dose and first decrease the dose.  Cut the total daily intake of opioids by one tablet each day Next start to increase the time between doses. The last dose that should be eliminated is the evening dose.          Allergies as of 09/17/2021       Reactions   Cefepime Anaphylaxis, Shortness Of Breath, Swelling, Dermatitis   Edoxaban Other (See Comments)   Causes hematomas   Penicillins Anaphylaxis   Barium Iodide Rash   Other    Other reaction(s): Other Preservatives  (C-Diff) Other reaction(s): Unknown Preservatives  (C-Diff) Other reaction(s): OTHER   Acetaminophen    Will not take after Cyanide deaths, something changed when they mkt'd again.  Can take Childrens Liquid but sometimes upset stomach too   Albuterol Hypertension   ABLE TO TOLERATE IN INHALER FORM NOT NEBULIZER    Aspirin    Makes anemic   Beta Adrenergic Blockers    Paradoxical reactions   Diltiazem Other (See Comments)   Patient is intolerant to all CALCIUM CHANNEL BLOCKERS, causes her blood pressure to rise.    Lisinopril    Can only take her own mfr.     Oxycodone    Prednisone    REACTION: ibs probs   Propofol    REACTION: FEVER   Repatha [evolocumab] Other (See Comments)   Knocked her out for 10 days   Statins    GI issues   Sulfamethoxazole-trimethoprim    REACTION: antibiotic induced colitis   Lac Bovis Other (See Comments)        Medication List     TAKE these medications    albuterol 108 (90 Base) MCG/ACT inhaler Commonly known as: VENTOLIN HFA Inhale 2 puffs into the lungs 3 (three) times daily as needed for wheezing or shortness of breath.   amLODipine 5 MG tablet Commonly known as: NORVASC Take  1 tablet (5 mg total) by mouth 2 (two) times daily.   aspirin 81 MG chewable tablet Chew 81 mg by mouth 2 (two) times a week. No specific days   methocarbamol 500 MG tablet Commonly known as: Robaxin Take 1 tablet (500 mg total) by mouth every 8 (eight) hours as needed for muscle spasms.   NovoLOG FlexPen 100 UNIT/ML FlexPen Generic drug: insulin aspart Inject 0-2 Units into the skin with breakfast, with lunch, and with evening meal. Sliding scale   oxyCODONE-acetaminophen 10-325 MG tablet Commonly known as: PERCOCET Take 1 tablet by mouth every 4 (four) hours as needed for pain.   promethazine 25 MG tablet Commonly known as: PHENERGAN Take 25 mg by mouth every 12 (twelve) hours as needed for nausea. What changed: Another medication with the same name was added. Make sure you understand how and when to take each.   promethazine 25 MG suppository Commonly known as: [place]  25 mg rectally daily as needed for nausea. What changed: Another medication with the same name was added. Make sure you understand how and when to take each.   promethazine 25 MG tablet Commonly known as: PHENERGAN Take 1 tablet (25 mg total) by mouth every 6 (six) hours as needed for nausea or vomiting. What changed: You were already taking a medication with the same name, and this prescription was added. Make sure you understand how and when to take each.   Stiolto Respimat 2.5-2.5 MCG/ACT Aers Generic drug: Tiotropium Bromide-Olodaterol Inhale 2 puffs into the lungs daily.   Vitamin D (Ergocalciferol) 1.25 MG (50000 UNIT) Caps capsule Commonly known as: DRISDOL Take 50,000 Units by mouth once a week.          Signed: [name] 09/17/2021, 8:09 AM  [facility] is now [employer] [facility] [employer] [address], [address], [place], [place] [address] Phone: [phone] Facebook  Instagram  [employer]

## 2021-09-17 NOTE — Progress Notes (Signed)
Orthopedic Tech Progress Note Patient Details:  [name] 12/09/1955 [id number]  Patient ID: [name], female   DOB: [birth year], 65 y.o.   MRN: [id number]  Kizzie Fantasia 09/17/2021, 9:40 AM Left arm sling applied

## 2021-09-17 NOTE — Progress Notes (Signed)
Patient discharged to home w/ SO. Given all belongings, instructions, equipment. Verbalized understanding of all instructions. Escorted to pov via w/c. 

## 2021-09-17 NOTE — Plan of Care (Signed)
Plan of care initiated and discussed with the patient. 

## 2021-09-17 NOTE — Op Note (Signed)
NAME: [name], [name]. MEDICAL RECORD NO: [id number] ACCOUNT NO: [id number] DATE OF BIRTH: January 16, 1956 FACILITY: [facility] LOCATION: [id number] PHYSICIAN: Almedia Balls. Ranell Patrick, MD  Operative Report   DATE OF PROCEDURE: 09/16/2021  PREOPERATIVE DIAGNOSIS:  Left comminuted and displaced proximal humerus fracture.  POSTOPERATIVE DIAGNOSIS:  Left comminuted and displaced proximal humerus fracture.  PROCEDURE PERFORMED:  Left reverse shoulder replacement using DePuy Delta Xtend prosthesis with tuberosity repair.  ATTENDING SURGEON:  Almedia Balls. Ranell Patrick, MD.  ASSISTANT:  Konrad Felix Dixon, [place], who was scrubbed during the entire procedure and necessary for satisfactory completion of surgery,   ANESTHESIA: General anesthesia plus interscalene block was used.  ESTIMATED BLOOD LOSS:  150 mL  FLUID REPLACEMENT:  1000 mL crystalloid.  COUNTS:  Instrument counts correct.   COMPLICATIONS:  No complications.    ANTIBIOTICS: Perioperative antibiotics were given.  INDICATIONS:  The patient is a 65 year old female who presents with a history of a ground level fall injuring her left shoulder.  The patient was initially seen in [place] and was evaluated at the Emergency Department with x-rays and CT scan indicating  a comminuted and displaced proximal humerus fracture with unacceptable fracture alignment.  The patient drove herself to [facility] for further evaluation and treatment, presents with this unstable fracture pattern.  We discussed all options for  treatment, conservative options likely resulting in a poor outcome and recommended reverse shoulder replacement as a way to create constraint in the shoulder and immediate stability with pain relief and immediate return of function.  Risks including but  not limited to infection and dislocation discussed with the patient, informed consent was obtained.  DESCRIPTION OF PROCEDURE:  After an adequate level of anesthesia was achieved, the patient was positioned  in the modified beach chair position.  Left shoulder correctly identified and sterilely prepped and draped in the usual manner.  Time-out called,  verifying correct patient, correct site.  We entered the patient's shoulder using a standard deltopectoral incision starting at the coracoid process, extending down the anterior humeral shaft.  Dissection down through subcutaneous tissues using Bovie.   We identified the cephalic vein and took that laterally.  The deltoid pectoralis was taken medially.  Conjoined tendon identified and retracted medially.  We placed our deep retractors.  We tenodesed the biceps in situ with 0 Vicryl figure-of-eight  suture.  We then osteotomized the lesser tuberosity from the fractured proximal fragment.  Once we had the lesser debulked with a rongeur, I went ahead and placed two #2 FiberWire sutures medial to the lesser tuberosity and through the subscap.  We then  like wise were able to dissect the greater tuberosity free from the proximal fragment.  The humeral head had been driven down in a valgus impacted fashion and was at least 4 mm deep to the greater tuberosity edge, so the alignment would not have worked  for her.  We did go ahead and place three #2 FiberWire sutures in a mattress fashion lateral to the greater tuberosity in the rotator cuff tendon.  At this point, we removed the humeral head.  We removed excess cancellous bone for bone grafting at the  end of surgery. We then went ahead and once we had our tuberosities identified and debulked and sutures placed, we addressed the glenoid side.  The patient had some glenoid wear, but still a fair amount of cartilage to remove and the glenoid labrum was  removed in the biceps stump.  The patient had extraordinarily small glenoid face.  There were  no fractures.  We did find the center point, placed our guide pin.  We reamed for the metaglene baseplate.  We then drilled our central peg hole and impacted  the metaglene into  position.  We were only able to get 2 screws, the superior and inferior screws, but we had good alignment with the 12 o'clock, 6 o'clock position and good screw placement, and well purchasing 36 screw inferiorly, decent purchasing 30  screw proximally.  We locked the screws in place, baseplate secured.  It was good.  We secured a 38 standard glenosphere to the baseplate and did a finger sweep to make sure no soft tissue was caught up in that bearing.  Next, we went ahead and went back  to the humeral side.  There was a pretty significant amount of comminution around the fractured humerus such that there was a large butterfly fragment anteriorly that just fell away.  The patient was extremely osteoporotic with thin cortices.  We were  able to prep the humeral shaft to accept a 10 stem and we were going to use a Porocoat stem with the suture tray proximally . We used a centered one and trialed that. We placed a 38+3 poly onto the humeral tray and reduced the shoulder.  We set our  retroversion at 20 degrees.  At this point, after completing the trialing, we removed the trial components.  We irrigated thoroughly, dried the canal.  We vacuum mixed high viscosity cement and cemented the real components into place.  It was a 10  Porocoat stem with the Porocoat suture tray. Proximally, we had an around-the-world stitch through the stem itself.  We placed the stem in 20 degrees of retroversion to the appropriate height.  We allowed the cement to set up.  We then went ahead and  trialed again with a 38+3 poly trial on the humeral tray.  We reduced the shoulder, had appropriate tension and good stability and really good range of motion.  We then made sure that the rotator cuff was freed up and was not stuck to the underside of  the deltoid.  There were a couple areas where it was basically spot welded, it looked like she had multiple old tears in the supraspinatus tendon and into the infraspinatus, but we freed that up  with the finger and made sure that it was balanced well and  then we made sure that we could get appropriate alignment of the tuberosities back in position relative to the shaft.  We did the around-the-world sutures medial to the lesser and lateral to the greater and passed those sutures first.  We then went  ahead and bone grafted adjacent to the lesser and greater tuberosities against the shaft and also the implant and then we tied the tuberosities to each other, greater to lesser and then also did the around-the-world stitch, tied that down.  We also took  some suture through a couple of the drill holes in the humeral suture tray on the humerus itself and we had a really secure fixation of our lesser and our greater tuberosity.  One thing that was good was the external rotation was not limited at all.  We had  really good external rotation out to about 45 degrees with no displacement of the tuberosity whatsoever.  Forward elevation was really good up to 140.  The patient was a little tight in cross arm adduction and internal rotation I suspect because of  significant contracture of the posterior  structures. I had made sure did a finger sweep, we did not have any bony impingement at all. I really felt like it was all soft tissue tension, so that it will hopefully be something she can stretch out.  We could  get her arm on her abdomen, but it was fairly snug, so she will have some more ahead of her to get her internal rotation back, but overall very stable.  Everything moved in together as a unit.  We irrigated thoroughly.  We then closed the deltopectoral  interval with 0 Vicryl suture followed by 2-0 Vicryl for subcutaneous closure and staples for skin.  Sterile dressing applied, followed by a shoulder sling.  The patient transported to recovery room in stable condition.   PAA D: 09/16/2021 7:29:45 pm T: 09/17/2021 5:24:00 am  JOB: [id number]/ [id number]

## 2021-09-17 NOTE — Evaluation (Signed)
Occupational Therapy Evaluation Patient Details Name: [name] MRN: [id number] DOB: [birth year] Today's Date: 09/17/2021   History of Present Illness 65 yo F s/p fall at home, underwent R TSA Reverse.   Clinical Impression   Patient admitted for the above diagnosis and procedure.  PTA she lives with her spouse, who can provide assist as needed.  Primary deficit is ROM restrictions to R shoulder.  Currently she is needing expected Min a for upper body ADL, but understands all precautions, HEP, and sling management.  No further acute OT needs, follow up with MD as prescribed.       Recommendations for follow up therapy are one component of a multi-disciplinary discharge planning process, led by the attending physician.  Recommendations may be updated based on patient status, additional functional criteria and insurance authorization.   Follow Up Recommendations  Follow physician's recommendations for discharge plan and follow up therapies    Assistance Recommended at Discharge Intermittent Supervision/Assistance  Functional Status Assessment  Patient has had a recent decline in their functional status and demonstrates the ability to make significant improvements in function in a reasonable and predictable amount of time.  Equipment Recommendations  None recommended by OT    Recommendations for Other Services       Precautions / Restrictions Precautions Precautions: Shoulder Type of Shoulder Precautions: No A/PROM at shoulder Shoulder Interventions: Shoulder sling/immobilizer;Off for dressing/bathing/exercises Precaution Booklet Issued: Yes (comment) Precaution Comments: Reviewed Restrictions Weight Bearing Restrictions: Yes RUE Weight Bearing: Non weight bearing Other Position/Activity Restrictions: Pillow behind shoulder seated and supine      Mobility Bed Mobility               General bed mobility comments: sitting EOB    Transfers Overall transfer level:  Modified independent                        Balance Overall balance assessment: Mild deficits observed, not formally tested                                         ADL either performed or assessed with clinical judgement   ADL Overall ADL's : Modified independent                                       General ADL Comments: Able to complete UB ADL with setup and Min A as expected.     Vision Patient Visual Report: No change from baseline       Perception Perception Perception: Not tested   Praxis Praxis Praxis: Not tested    Pertinent Vitals/Pain Pain Assessment: Faces Faces Pain Scale: Hurts little more Pain Location: R upper arm Pain Descriptors / Indicators: Aching;Tender Pain Intervention(s): Monitored during session;Premedicated before session     Hand Dominance Right   Extremity/Trunk Assessment Upper Extremity Assessment Upper Extremity Assessment: RUE deficits/detail RUE Deficits / Details: surgery with Shoulder restrictions.  AROM to elbow distal WNL RUE Sensation: decreased light touch RUE Coordination: WNL   Lower Extremity Assessment Lower Extremity Assessment: Overall WFL for tasks assessed   Cervical / Trunk Assessment Cervical / Trunk Assessment: Normal   Communication Communication Communication: No difficulties   Cognition Arousal/Alertness: Awake/alert Behavior During Therapy: WFL for tasks assessed/performed Overall Cognitive Status: Within Functional Limits for  tasks assessed                                       General Comments   VSS    Exercises     Shoulder Instructions      Home Living Family/patient expects to be discharged to:: Private residence Living Arrangements: Spouse/significant other Available Help at Discharge: Family;Available 24 hours/day Type of Home: House Home Access: Stairs to enter [employer] of Steps: 12 Entrance Stairs-Rails:  Left Home Layout: Two level;Able to live on main level with bedroom/bathroom     Bathroom Shower/Tub: Producer, television/film/video: Standard Bathroom Accessibility: Yes How Accessible: Accessible via walker Home Equipment: Cane - single point          Prior Functioning/Environment Prior Level of Function : Independent/Modified Independent                        OT Problem List: Pain;Decreased range of motion      OT Treatment/Interventions:      OT Goals(Current goals can be found in the care plan section) Acute Rehab OT Goals Patient Stated Goal: Awaiting spouse to return home OT Goal Formulation: With patient Time For Goal Achievement: 09/18/21 Potential to Achieve Goals: Good  OT Frequency:     Barriers to D/C:  None noted          Co-evaluation              AM-PAC OT "6 Clicks" Daily Activity     Outcome Measure Help from another person eating meals?: None Help from another person taking care of personal grooming?: A Little Help from another person toileting, which includes using toliet, bedpan, or urinal?: None Help from another person bathing (including washing, rinsing, drying)?: A Little Help from another person to put on and taking off regular upper body clothing?: A Little Help from another person to put on and taking off regular lower body clothing?: None 6 Click Score: 21   End of Session    Activity Tolerance: Patient tolerated treatment well Patient left: in bed;with call bell/phone within reach  OT Visit Diagnosis: Pain Pain - Right/Left: Right Pain - part of body: Shoulder                Time: 8756-4332 OT Time Calculation (min): 32 min Charges:  OT General Charges $OT Visit: 1 Visit OT Evaluation $OT Eval Moderate Complexity: 1 Mod OT Treatments $Self Care/Home Management : 8-22 mins  09/17/2021  RP, OTR/L  Acute Rehabilitation Services  Office:  [phone]   Suzanna Obey 09/17/2021, 8:47 AM

## 2021-09-18 LAB — HEMOGLOBIN A1C
Hgb A1c MFr Bld: 6.7 % — ABNORMAL HIGH (ref 4.8–5.6)
Mean Plasma Glucose: 146 mg/dL

## 2021-09-22 ENCOUNTER — Encounter (HOSPITAL_COMMUNITY): Admission: EM | Disposition: A | Discharge status: Home / Self Care | Payer: Self-pay | Attending: Cardiology

## 2021-09-22 DIAGNOSIS — I2119 ST elevation (STEMI) myocardial infarction involving other coronary artery of inferior wall: Secondary | ICD-10-CM

## 2021-09-22 HISTORY — DX: ST elevation (STEMI) myocardial infarction involving other coronary artery of inferior wall: I21.19

## 2021-09-22 HISTORY — PX: LEFT HEART CATH AND CORONARY ANGIOGRAPHY: CATH118249

## 2021-09-22 HISTORY — PX: CORONARY/GRAFT ACUTE MI REVASCULARIZATION: CATH118305

## 2021-09-22 SURGERY — CORONARY/GRAFT ACUTE MI REVASCULARIZATION
Anesthesia: LOCAL

## 2021-09-22 SURGICAL SUPPLY — 20 items
CATH INFINITI 5FR MPB2 (CATHETERS) ×1 IMPLANT
CATH INFINITI JR4 5F (CATHETERS) ×1 IMPLANT
CATH VISTA GUIDE 6FR JR4 (CATHETERS) ×1 IMPLANT
CLOSURE PERCLOSE PROSTYLE (VASCULAR PRODUCTS) ×2 IMPLANT
ELECT DEFIB PAD ADLT CADENCE (PAD) ×1 IMPLANT
GLIDESHEATH SLEND A-KIT 6F 22G (SHEATH) IMPLANT
GUIDEWIRE INQWIRE 1.5J.035X260 (WIRE) IMPLANT
INQWIRE 1.5J .035X260CM (WIRE)
KIT ENCORE 26 ADVANTAGE (KITS) ×1 IMPLANT
KIT HEART LEFT (KITS) ×2 IMPLANT
PACK CARDIAC CATHETERIZATION (CUSTOM PROCEDURE TRAY) ×2 IMPLANT
SHEATH PINNACLE 6F 10CM (SHEATH) ×2 IMPLANT
SHEATH PROBE COVER 6X72 (BAG) ×1 IMPLANT
STENT SYNERGY XD 3.0X48 (Permanent Stent) IMPLANT
SYNERGY XD 3.0X48 (Permanent Stent) ×2 IMPLANT
TRANSDUCER W/STOPCOCK (MISCELLANEOUS) ×2 IMPLANT
TUBING CIL FLEX 10 FLL-RA (TUBING) ×2 IMPLANT
WIRE EMERALD 3MM-J .035X150CM (WIRE) ×1 IMPLANT
WIRE MICRO SET SILHO 5FR 7 (SHEATH) ×1 IMPLANT
WIRE RUNTHROUGH .014X180CM (WIRE) ×1 IMPLANT

## 2021-09-22 NOTE — H&P (Addendum)
CARDIOLOGY ADMIT NOTE   Patient ID: [name] MRN: [id number] DOB/AGE: [birth year] 65 y.o.  Admit date: 09/23/2021 Primary Physician:  Patient, No Pcp Per (Inactive)  Patient ID: [name], female    DOB: Mar 26, 1956, 65 y.o.   MRN: [id number]  CC: Unresponsive and abnormal EKG  HPI:    [name]  is a 65 y.o. Caucasian female patient with CAD S/P CABG X 4, Hypertension, hyperlipidemia, DM, COPD secondary to prior tobacco use disorder, diverticulosis, right renal artery stenosis by CTA in 2017 now admitted with acute inferolateral STEMI.    Presented to [facility] hospital via EMS unresponsive at home and husband  after he returned from work (approximately 3 hours of lag time) found her unresponsive and called EMS and was intubated in the field. Patient upon presentation to the Hospital at [facility] was responding appropriately and had to be sedated. EKG revealing acute injury pattern in the inferior leads. Patient then transferred to [facility] hospital for emergent cardiac catheterization.  Patient had just undergone left shoulder arthroplasty on 09/16/21 and discharged on 09/17/21.   Past Medical History:  Diagnosis Date   Asthma    Benign essential HTN    COPD (chronic obstructive pulmonary disease) (HCC)    IBS (irritable bowel syndrome)    Migraine headache    Past Surgical History:  Procedure Laterality Date   REVERSE SHOULDER ARTHROPLASTY Left 09/16/2021   Procedure: REVERSE SHOULDER ARTHROPLASTY;  Surgeon: Netta Cedars, MD;  Location: [facility];  Service: Orthopedics;  Laterality: Left;   Social History   Socioeconomic History   Marital status: Married    Spouse name: Not on file   Number of children: Not on file   Years of education: Not on file   Highest education level: Not on file  Occupational History   Not on file  Tobacco Use   Smoking status: Heavy Smoker    Packs/day: 1.00    Types: Cigarettes   Smokeless tobacco: Never  Vaping Use   Vaping  Use: Never used  Substance and Sexual Activity   Alcohol use: Yes    Alcohol/week: 0.0 standard drinks    Comment: Occasional to Rare   Drug use: No   Sexual activity: Not on file  Other Topics Concern   Not on file  Social History Narrative   Not on file   Social Determinants of Health   Financial Resource Strain: Not on file  Food Insecurity: Not on file  Transportation Needs: Not on file  Physical Activity: Not on file  Stress: Not on file  Social Connections: Not on file  Intimate Partner Violence: Not on file   Family History  Problem Relation Age of Onset   Hypertension Mother    CAD Brother        Deceased on MI age 8   Cancer - Other Father        Oropharyngeal SCCa    ROS  Review of Systems  Unable to perform ROS: Intubated  Objective   Vitals with BMI 09/23/2021 09/23/2021 09/23/2021  Height - - -  Weight - - -  BMI - - -  Systolic 315 176 160  Diastolic 67 66 60  Pulse 91 95 96     Physical Exam Constitutional:      Interventions: She is sedated and intubated.  Eyes:     Conjunctiva/sclera: Conjunctivae normal.  Neck:     Vascular: No carotid bruit or JVD.  Cardiovascular:     Pulses:  Popliteal pulses are 0 on the right side and 0 on the left side.       Dorsalis pedis pulses are 0 on the right side and 0 on the left side.       Posterior tibial pulses are 0 on the right side and 0 on the left side.     Heart sounds: Heart sounds are distant.  Pulmonary:     Effort: She is intubated.     Breath sounds: Normal breath sounds and air entry.  Musculoskeletal:     Cervical back: Neck supple.     Right lower leg: No edema.  Skin:    Capillary Refill: Capillary refill takes less than 2 seconds.     Coloration: Skin is pale.   Laboratory examination:   Recent Labs    09/17/21 0323 09/23/21 0041  NA 131* 131*  131*  K 4.2 4.2  4.2  CL 98 90*  CO2 26  --   GLUCOSE 208* 251*  BUN 17 11  CREATININE 0.87 1.30*  CALCIUM 8.5*  --    GFRNONAA >60  --    estimated creatinine clearance is 36.8 mL/min (A) (by C-G formula based on SCr of 1.3 mg/dL (H)).  CMP Latest Ref Rng & Units 09/23/2021 09/23/2021 09/17/2021  Glucose 70 - 99 mg/dL 251(H) - 208(H)  BUN 8 - 23 mg/dL 11 - 17  Creatinine 0.44 - 1.00 mg/dL 1.30(H) - 0.87  Sodium 135 - 145 mmol/L 131(L) 131(L) 131(L)  Potassium 3.5 - 5.1 mmol/L 4.2 4.2 4.2  Chloride 98 - 111 mmol/L 90(L) - 98  CO2 22 - 32 mmol/L - - 26  Calcium 8.9 - 10.3 mg/dL - - 8.5(L)  Total Protein 6.5 - 8.1 g/dL - - -  Total Bilirubin 0.3 - 1.2 mg/dL - - -  Alkaline Phos 38 - 126 U/L - - -  AST 15 - 41 U/L - - -  ALT 0 - 44 U/L - - -   CBC Latest Ref Rng & Units 09/23/2021 09/23/2021 09/17/2021  WBC 4.0 - 10.5 K/uL - - -  Hemoglobin 12.0 - 15.0 g/dL 8.8(L) 8.5(L) 10.5(L)  Hematocrit 36.0 - 46.0 % 26.0(L) 25.0(L) 32.5(L)  Platelets 150 - 400 K/uL - - -   Lipid Panel     Component Value Date/Time   CHOL 230 (H) 10/24/2009 1152   TRIG 146.0 10/24/2009 1152   HDL 39.30 10/24/2009 1152   CHOLHDL 6 10/24/2009 1152   VLDL 29.2 10/24/2009 1152   LDLDIRECT 173.8 10/24/2009 1152   HEMOGLOBIN A1C Lab Results  Component Value Date   HGBA1C 6.7 (H) 09/17/2021   MPG 146 09/17/2021   TSH No results for input(s): TSH in the last 8760 hours. BNP (last 3 results) No results for input(s): BNP in the last 8760 hours. Cardiac Panel (last 3 results) No results for input(s): CKTOTAL, CKMB, TROPONINIHS, RELINDX in the last 72 hours.   Medications and allergies   Allergies  Allergen Reactions   Cefepime Anaphylaxis, Shortness Of Breath, Swelling and Dermatitis   Edoxaban Other (See Comments)    Causes hematomas   Penicillins Anaphylaxis   Barium Iodide Rash   Other     Other reaction(s): Other Preservatives  (C-Diff) Other reaction(s): Unknown Preservatives  (C-Diff) Other reaction(s): OTHER   Acetaminophen     Will not take after Cyanide deaths, something changed when they mkt'd again.  Can  take Childrens Liquid but sometimes upset stomach too   Albuterol Hypertension  ABLE TO TOLERATE IN INHALER FORM NOT NEBULIZER    Aspirin     Makes anemic   Beta Adrenergic Blockers     Paradoxical reactions   Diltiazem Other (See Comments)    Patient is intolerant to all CALCIUM CHANNEL BLOCKERS, causes her blood pressure to rise.    Lisinopril     Can only take her own mfr.     Oxycodone    Prednisone     REACTION: ibs probs   Propofol     REACTION: FEVER   Repatha [Evolocumab] Other (See Comments)    Knocked her out for 10 days   Statins     GI issues   Sulfamethoxazole-Trimethoprim     REACTION: antibiotic induced colitis   Lac Bovis Other (See Comments)     Prior to Admission medications   Medication Sig Start Date End Date Taking? Authorizing Provider  albuterol (PROVENTIL HFA;VENTOLIN HFA) 108 (90 BASE) MCG/ACT inhaler Inhale 2 puffs into the lungs 3 (three) times daily as needed for wheezing or shortness of breath.    [provider]  amLODipine (NORVASC) 5 MG tablet Take 1 tablet (5 mg total) by mouth 2 (two) times daily. 03/31/20 09/16/21  Enzo Bi, MD  aspirin 81 MG chewable tablet Chew 81 mg by mouth 2 (two) times a week. No specific days 05/04/16   [provider]  methocarbamol (ROBAXIN) 500 MG tablet Take 1 tablet (500 mg total) by mouth every 8 (eight) hours as needed for muscle spasms. 09/16/21   Netta Cedars, MD  NOVOLOG FLEXPEN 100 UNIT/ML FlexPen Inject 0-2 Units into the skin with breakfast, with lunch, and with evening meal. Sliding scale 07/10/21   [provider]  oxyCODONE-acetaminophen (PERCOCET) 10-325 MG tablet Take 1 tablet by mouth every 4 (four) hours as needed for pain. 09/16/21   Netta Cedars, MD  promethazine (PHENERGAN) 25 MG suppository Place 25 mg rectally daily as needed for nausea. 09/14/21   [provider]  promethazine (PHENERGAN) 25 MG tablet Take 25 mg by mouth every 12 (twelve) hours as needed for  nausea. 09/01/21   [provider]  promethazine (PHENERGAN) 25 MG tablet Take 1 tablet (25 mg total) by mouth every 6 (six) hours as needed for nausea or vomiting. 09/17/21   Netta Cedars, MD  STIOLTO RESPIMAT 2.5-2.5 MCG/ACT AERS Inhale 2 puffs into the lungs daily. 08/21/21   [provider]  Vitamin D, Ergocalciferol, (DRISDOL) 1.25 MG (50000 UNIT) CAPS capsule Take 50,000 Units by mouth once a week. 08/31/21   [provider]    Current Outpatient Medications  Medication Instructions   albuterol (PROVENTIL HFA;VENTOLIN HFA) 108 (90 BASE) MCG/ACT inhaler 2 puffs, Inhalation, 3 times daily PRN   amLODipine (NORVASC) 5 mg, Oral, 2 times daily   aspirin 81 mg, Oral, 2 times weekly, No specific days   methocarbamol (ROBAXIN) 500 mg, Oral, Every 8 hours PRN   NovoLOG FlexPen 0-2 Units, Subcutaneous, 3 times daily with meals, Sliding scale   oxyCODONE-acetaminophen (PERCOCET) 10-325 MG tablet 1 tablet, Oral, Every 4 hours PRN   promethazine (PHENERGAN) 25 mg, Rectal, Daily PRN   promethazine (PHENERGAN) 25 mg, Oral, Every 12 hours PRN   promethazine (PHENERGAN) 25 mg, Oral, Every 6 hours PRN   STIOLTO RESPIMAT 2.5-2.5 MCG/ACT AERS 2 puffs, Inhalation, Daily   Vitamin D (Ergocalciferol) (DRISDOL) 50,000 Units, Oral, Weekly    No intake/output data recorded. No intake/output data recorded.   Radiology:  No results found.  Cardiac Studies:  Echocardiogram 09/21/2021:     1. Left ventricular ejection fraction, by estimation, is 55 to 60%. The  left ventricle has normal function. The left ventricle has no regional  wall motion abnormalities. There is moderate left ventricular hypertrophy.  Left ventricular diastolic parameters are consistent with Grade I diastolic dysfunction (impaired relaxation).   2. Right ventricular systolic function is normal. The right ventricular  size is normal. Tricuspid regurgitation signal is inadequate for assessing  PA pressure.    3. The mitral valve is normal in structure. No evidence of mitral valve  regurgitation. No evidence of mitral stenosis.   4. The aortic valve is normal in structure. Aortic valve regurgitation is  not visualized. No aortic stenosis is present.   5. The inferior vena cava is normal in size with greater than 50%  respiratory variability, suggesting right atrial pressure of 3 mmHg.  CABG 04/29/2016: LIMA to LAD, SVG to RCA, SVG to OM1, SVG to OM2 for left main 90% stenosis 50% stenosis in circumflex and 60% stenosis in OM1, 50% stenosis in proximal RCA.  EKG 09/22/21 at 22:59 hours: Acute injury pattern in the inferior leads with reciprocal ST depression in lateral leads. Underlying Mobitz I AV Block.    EKG 03/29/2020: Sinus tachycardia at rate of 106 bpm, poor R wave progression, probably normal variant.  No evidence of ischemia.  Assessment   [name] is a 65 y.o. Caucasian female patient with CAD S/P CABG X 4, Hypertension, hyperlipidemia, DM, COPD secondary to prior tobacco use disorder, diverticulosis, right renal artery stenosis by CTA in 2017 now admitted with acute inferolateral STEMI.    Patient found unresponsive and intubated in the field at home. No CPR. Found to be bradycardic and inferior STEMI. Acute inferior STEMI with Mobitz I AV Block Cardiac syncope  Primary hypertension Hypercholesterolemia COPD with prior tobacco use DM controlled with stage 3a CKD  Recommendations:   Will proceed with cardiac catheterization and possible angioplasty in view of profound EKG abnormality and underlying heart block. Further recommendations to follow.   Add: Post PCI hemodynamics stable. Met husband and discussed the procedure and potential complications in the next 24 hours including arrhythmias and mechanical complications.   I spent a total of 50 minutes in the discussions with ED at [facility], evaluation of her old care everywhere records, coordination of care and making  critical decisions that were life threatening.    Adrian Prows, MD, [facility] 09/23/2021, 1:31 AM Office: [phone] Fax: [phone] Pager: [phone]

## 2021-09-23 ENCOUNTER — Inpatient Hospital Stay (HOSPITAL_COMMUNITY): Payer: 59

## 2021-09-23 ENCOUNTER — Emergency Department (HOSPITAL_COMMUNITY): Admission: EM | Admit: 2021-09-23 | Payer: 59 | Source: Home / Self Care

## 2021-09-23 ENCOUNTER — Inpatient Hospital Stay (HOSPITAL_COMMUNITY)
Admission: EM | Admit: 2021-09-23 | Discharge: 2021-10-02 | DRG: 246 | Disposition: A | Discharge status: Home / Self Care | Payer: 59 | Source: Other Acute Inpatient Hospital | Attending: Internal Medicine | Admitting: Internal Medicine

## 2021-09-23 ENCOUNTER — Encounter (HOSPITAL_COMMUNITY): Payer: Self-pay | Admitting: Cardiology

## 2021-09-23 ENCOUNTER — Other Ambulatory Visit (HOSPITAL_COMMUNITY): Payer: Self-pay

## 2021-09-23 DIAGNOSIS — I4891 Unspecified atrial fibrillation: Secondary | ICD-10-CM | POA: Diagnosis present

## 2021-09-23 DIAGNOSIS — J9622 Acute and chronic respiratory failure with hypercapnia: Secondary | ICD-10-CM | POA: Diagnosis present

## 2021-09-23 DIAGNOSIS — J9690 Respiratory failure, unspecified, unspecified whether with hypoxia or hypercapnia: Secondary | ICD-10-CM

## 2021-09-23 DIAGNOSIS — F419 Anxiety disorder, unspecified: Secondary | ICD-10-CM | POA: Diagnosis present

## 2021-09-23 DIAGNOSIS — I52 Other heart disorders in diseases classified elsewhere: Secondary | ICD-10-CM | POA: Diagnosis present

## 2021-09-23 DIAGNOSIS — Z20822 Contact with and (suspected) exposure to covid-19: Secondary | ICD-10-CM | POA: Diagnosis present

## 2021-09-23 DIAGNOSIS — Z885 Allergy status to narcotic agent status: Secondary | ICD-10-CM

## 2021-09-23 DIAGNOSIS — F32A Depression, unspecified: Secondary | ICD-10-CM | POA: Diagnosis present

## 2021-09-23 DIAGNOSIS — G9341 Metabolic encephalopathy: Secondary | ICD-10-CM | POA: Diagnosis not present

## 2021-09-23 DIAGNOSIS — I2119 ST elevation (STEMI) myocardial infarction involving other coronary artery of inferior wall: Secondary | ICD-10-CM

## 2021-09-23 DIAGNOSIS — J441 Chronic obstructive pulmonary disease with (acute) exacerbation: Secondary | ICD-10-CM | POA: Diagnosis not present

## 2021-09-23 DIAGNOSIS — N1831 Chronic kidney disease, stage 3a: Secondary | ICD-10-CM | POA: Diagnosis present

## 2021-09-23 DIAGNOSIS — Z7982 Long term (current) use of aspirin: Secondary | ICD-10-CM

## 2021-09-23 DIAGNOSIS — R609 Edema, unspecified: Secondary | ICD-10-CM | POA: Diagnosis not present

## 2021-09-23 DIAGNOSIS — Z951 Presence of aortocoronary bypass graft: Secondary | ICD-10-CM

## 2021-09-23 DIAGNOSIS — E871 Hypo-osmolality and hyponatremia: Secondary | ICD-10-CM | POA: Diagnosis present

## 2021-09-23 DIAGNOSIS — I5043 Acute on chronic combined systolic (congestive) and diastolic (congestive) heart failure: Secondary | ICD-10-CM | POA: Diagnosis present

## 2021-09-23 DIAGNOSIS — J9621 Acute and chronic respiratory failure with hypoxia: Secondary | ICD-10-CM | POA: Diagnosis not present

## 2021-09-23 DIAGNOSIS — R0902 Hypoxemia: Secondary | ICD-10-CM

## 2021-09-23 DIAGNOSIS — J418 Mixed simple and mucopurulent chronic bronchitis: Secondary | ICD-10-CM | POA: Diagnosis not present

## 2021-09-23 DIAGNOSIS — E1122 Type 2 diabetes mellitus with diabetic chronic kidney disease: Secondary | ICD-10-CM | POA: Diagnosis present

## 2021-09-23 DIAGNOSIS — Z79899 Other long term (current) drug therapy: Secondary | ICD-10-CM

## 2021-09-23 DIAGNOSIS — Z4659 Encounter for fitting and adjustment of other gastrointestinal appliance and device: Secondary | ICD-10-CM

## 2021-09-23 DIAGNOSIS — J440 Chronic obstructive pulmonary disease with acute lower respiratory infection: Secondary | ICD-10-CM | POA: Diagnosis present

## 2021-09-23 DIAGNOSIS — N179 Acute kidney failure, unspecified: Secondary | ICD-10-CM | POA: Diagnosis present

## 2021-09-23 DIAGNOSIS — R579 Shock, unspecified: Secondary | ICD-10-CM | POA: Diagnosis present

## 2021-09-23 DIAGNOSIS — Z955 Presence of coronary angioplasty implant and graft: Secondary | ICD-10-CM

## 2021-09-23 DIAGNOSIS — I081 Rheumatic disorders of both mitral and tricuspid valves: Secondary | ICD-10-CM | POA: Diagnosis present

## 2021-09-23 DIAGNOSIS — D649 Anemia, unspecified: Secondary | ICD-10-CM | POA: Diagnosis present

## 2021-09-23 DIAGNOSIS — Z96612 Presence of left artificial shoulder joint: Secondary | ICD-10-CM | POA: Diagnosis present

## 2021-09-23 DIAGNOSIS — E0781 Sick-euthyroid syndrome: Secondary | ICD-10-CM | POA: Diagnosis present

## 2021-09-23 DIAGNOSIS — Z881 Allergy status to other antibiotic agents status: Secondary | ICD-10-CM

## 2021-09-23 DIAGNOSIS — I4430 Unspecified atrioventricular block: Secondary | ICD-10-CM | POA: Diagnosis present

## 2021-09-23 DIAGNOSIS — I340 Nonrheumatic mitral (valve) insufficiency: Secondary | ICD-10-CM | POA: Diagnosis not present

## 2021-09-23 DIAGNOSIS — K219 Gastro-esophageal reflux disease without esophagitis: Secondary | ICD-10-CM | POA: Diagnosis present

## 2021-09-23 DIAGNOSIS — E1165 Type 2 diabetes mellitus with hyperglycemia: Secondary | ICD-10-CM | POA: Diagnosis present

## 2021-09-23 DIAGNOSIS — I130 Hypertensive heart and chronic kidney disease with heart failure and stage 1 through stage 4 chronic kidney disease, or unspecified chronic kidney disease: Secondary | ICD-10-CM | POA: Diagnosis present

## 2021-09-23 DIAGNOSIS — Z884 Allergy status to anesthetic agent status: Secondary | ICD-10-CM

## 2021-09-23 DIAGNOSIS — R1310 Dysphagia, unspecified: Secondary | ICD-10-CM | POA: Diagnosis present

## 2021-09-23 DIAGNOSIS — E1151 Type 2 diabetes mellitus with diabetic peripheral angiopathy without gangrene: Secondary | ICD-10-CM | POA: Diagnosis present

## 2021-09-23 DIAGNOSIS — Z886 Allergy status to analgesic agent status: Secondary | ICD-10-CM

## 2021-09-23 DIAGNOSIS — Z888 Allergy status to other drugs, medicaments and biological substances status: Secondary | ICD-10-CM

## 2021-09-23 DIAGNOSIS — I25110 Atherosclerotic heart disease of native coronary artery with unstable angina pectoris: Secondary | ICD-10-CM

## 2021-09-23 DIAGNOSIS — Z978 Presence of other specified devices: Secondary | ICD-10-CM

## 2021-09-23 DIAGNOSIS — E7800 Pure hypercholesterolemia, unspecified: Secondary | ICD-10-CM | POA: Diagnosis present

## 2021-09-23 DIAGNOSIS — Z9911 Dependence on respirator [ventilator] status: Secondary | ICD-10-CM | POA: Diagnosis not present

## 2021-09-23 DIAGNOSIS — Z882 Allergy status to sulfonamides status: Secondary | ICD-10-CM

## 2021-09-23 DIAGNOSIS — I5031 Acute diastolic (congestive) heart failure: Secondary | ICD-10-CM

## 2021-09-23 DIAGNOSIS — Z880 Allergy status to penicillin: Secondary | ICD-10-CM

## 2021-09-23 DIAGNOSIS — J189 Pneumonia, unspecified organism: Secondary | ICD-10-CM | POA: Diagnosis present

## 2021-09-23 DIAGNOSIS — Z794 Long term (current) use of insulin: Secondary | ICD-10-CM

## 2021-09-23 DIAGNOSIS — F17210 Nicotine dependence, cigarettes, uncomplicated: Secondary | ICD-10-CM | POA: Diagnosis present

## 2021-09-23 DIAGNOSIS — Z8249 Family history of ischemic heart disease and other diseases of the circulatory system: Secondary | ICD-10-CM

## 2021-09-23 DIAGNOSIS — J9601 Acute respiratory failure with hypoxia: Secondary | ICD-10-CM

## 2021-09-23 HISTORY — DX: ST elevation (STEMI) myocardial infarction involving other coronary artery of inferior wall: I21.19

## 2021-09-23 LAB — POCT I-STAT 7, (LYTES, BLD GAS, ICA,H+H)
Acid-Base Excess: 0 mmol/L (ref 0.0–2.0)
Acid-Base Excess: 0 mmol/L (ref 0.0–2.0)
Acid-Base Excess: 3 mmol/L — ABNORMAL HIGH (ref 0.0–2.0)
Bicarbonate: 31 mmol/L — ABNORMAL HIGH (ref 20.0–28.0)
Bicarbonate: 31.4 mmol/L — ABNORMAL HIGH (ref 20.0–28.0)
Bicarbonate: 31.4 mmol/L — ABNORMAL HIGH (ref 20.0–28.0)
Calcium, Ion: 1.16 mmol/L (ref 1.15–1.40)
Calcium, Ion: 1.17 mmol/L (ref 1.15–1.40)
Calcium, Ion: 1.2 mmol/L (ref 1.15–1.40)
HCT: 28 % — ABNORMAL LOW (ref 36.0–46.0)
HCT: 29 % — ABNORMAL LOW (ref 36.0–46.0)
HCT: 31 % — ABNORMAL LOW (ref 36.0–46.0)
Hemoglobin: 10.5 g/dL — ABNORMAL LOW (ref 12.0–15.0)
Hemoglobin: 9.5 g/dL — ABNORMAL LOW (ref 12.0–15.0)
Hemoglobin: 9.9 g/dL — ABNORMAL LOW (ref 12.0–15.0)
O2 Saturation: 90 %
O2 Saturation: 98 %
O2 Saturation: 99 %
Patient temperature: 97.5
Patient temperature: 98
Patient temperature: 99.6
Potassium: 4 mmol/L (ref 3.5–5.1)
Potassium: 4.4 mmol/L (ref 3.5–5.1)
Potassium: 4.4 mmol/L (ref 3.5–5.1)
Sodium: 133 mmol/L — ABNORMAL LOW (ref 135–145)
Sodium: 135 mmol/L (ref 135–145)
Sodium: 135 mmol/L (ref 135–145)
TCO2: 33 mmol/L — ABNORMAL HIGH (ref 22–32)
TCO2: 34 mmol/L — ABNORMAL HIGH (ref 22–32)
TCO2: 35 mmol/L — ABNORMAL HIGH (ref 22–32)
pCO2 arterial: 100.9 mmHg (ref 32.0–48.0)
pCO2 arterial: 68.5 mmHg (ref 32.0–48.0)
pCO2 arterial: 98.9 mmHg (ref 32.0–48.0)
pH, Arterial: 7.097 — CL (ref 7.350–7.450)
pH, Arterial: 7.107 — CL (ref 7.350–7.450)
pH, Arterial: 7.266 — ABNORMAL LOW (ref 7.350–7.450)
pO2, Arterial: 124 mmHg — ABNORMAL HIGH (ref 83.0–108.0)
pO2, Arterial: 182 mmHg — ABNORMAL HIGH (ref 83.0–108.0)
pO2, Arterial: 83 mmHg (ref 83.0–108.0)

## 2021-09-23 LAB — ECHOCARDIOGRAM COMPLETE
Area-P 1/2: 8.82 cm2
S' Lateral: 2.7 cm
Weight: 2162.27 oz

## 2021-09-23 LAB — BASIC METABOLIC PANEL
Anion gap: 13 (ref 5–15)
BUN: 11 mg/dL (ref 8–23)
CO2: 26 mmol/L (ref 22–32)
Calcium: 7.9 mg/dL — ABNORMAL LOW (ref 8.9–10.3)
Chloride: 91 mmol/L — ABNORMAL LOW (ref 98–111)
Creatinine, Ser: 1.2 mg/dL — ABNORMAL HIGH (ref 0.44–1.00)
GFR, Estimated: 51 mL/min — ABNORMAL LOW (ref >60)
Glucose, Bld: 284 mg/dL — ABNORMAL HIGH (ref 70–99)
Potassium: 4 mmol/L (ref 3.5–5.1)
Sodium: 130 mmol/L — ABNORMAL LOW (ref 135–145)

## 2021-09-23 LAB — BRAIN NATRIURETIC PEPTIDE: B Natriuretic Peptide: 1323.2 pg/mL — ABNORMAL HIGH (ref 0.0–100.0)

## 2021-09-23 LAB — I-STAT CHEM 8, ED
BUN: 11 mg/dL (ref 8–23)
Calcium, Ion: 1.06 mmol/L — ABNORMAL LOW (ref 1.15–1.40)
Chloride: 90 mmol/L — ABNORMAL LOW (ref 98–111)
Creatinine, Ser: 1.3 mg/dL — ABNORMAL HIGH (ref 0.44–1.00)
Glucose, Bld: 251 mg/dL — ABNORMAL HIGH (ref 70–99)
HCT: 26 % — ABNORMAL LOW (ref 36.0–46.0)
Hemoglobin: 8.8 g/dL — ABNORMAL LOW (ref 12.0–15.0)
Potassium: 4.2 mmol/L (ref 3.5–5.1)
Sodium: 131 mmol/L — ABNORMAL LOW (ref 135–145)
TCO2: 29 mmol/L (ref 22–32)

## 2021-09-23 LAB — I-STAT ARTERIAL BLOOD GAS, ED
Acid-Base Excess: 2 mmol/L (ref 0.0–2.0)
Bicarbonate: 29.3 mmol/L — ABNORMAL HIGH (ref 20.0–28.0)
Calcium, Ion: 1.07 mmol/L — ABNORMAL LOW (ref 1.15–1.40)
HCT: 25 % — ABNORMAL LOW (ref 36.0–46.0)
Hemoglobin: 8.5 g/dL — ABNORMAL LOW (ref 12.0–15.0)
O2 Saturation: 75 %
Potassium: 4.2 mmol/L (ref 3.5–5.1)
Sodium: 131 mmol/L — ABNORMAL LOW (ref 135–145)
TCO2: 31 mmol/L (ref 22–32)
pCO2 arterial: 61.8 mmHg — ABNORMAL HIGH (ref 32.0–48.0)
pH, Arterial: 7.283 — ABNORMAL LOW (ref 7.350–7.450)
pO2, Arterial: 46 mmHg — ABNORMAL LOW (ref 83.0–108.0)

## 2021-09-23 LAB — POCT ACTIVATED CLOTTING TIME
Activated Clotting Time: 167 seconds
Activated Clotting Time: 468 seconds

## 2021-09-23 LAB — CBC
HCT: 28.2 % — ABNORMAL LOW (ref 36.0–46.0)
Hemoglobin: 8.8 g/dL — ABNORMAL LOW (ref 12.0–15.0)
MCH: 27.7 pg (ref 26.0–34.0)
MCHC: 31.2 g/dL (ref 30.0–36.0)
MCV: 88.7 fL (ref 80.0–100.0)
Platelets: 413 10*3/uL — ABNORMAL HIGH (ref 150–400)
RBC: 3.18 MIL/uL — ABNORMAL LOW (ref 3.87–5.11)
RDW: 14.6 % (ref 11.5–15.5)
WBC: 24.3 10*3/uL — ABNORMAL HIGH (ref 4.0–10.5)
nRBC: 0 % (ref 0.0–0.2)

## 2021-09-23 LAB — GLUCOSE, CAPILLARY
Glucose-Capillary: 263 mg/dL — ABNORMAL HIGH (ref 70–99)
Glucose-Capillary: 227 mg/dL — ABNORMAL HIGH (ref 70–99)
Glucose-Capillary: 105 mg/dL — ABNORMAL HIGH (ref 70–99)
Glucose-Capillary: 118 mg/dL — ABNORMAL HIGH (ref 70–99)
Glucose-Capillary: 155 mg/dL — ABNORMAL HIGH (ref 70–99)
Glucose-Capillary: 166 mg/dL — ABNORMAL HIGH (ref 70–99)

## 2021-09-23 LAB — RESP PANEL BY RT-PCR (FLU A&B, COVID) ARPGX2
Influenza A by PCR: NEGATIVE
Influenza B by PCR: NEGATIVE
SARS Coronavirus 2 by RT PCR: NEGATIVE

## 2021-09-23 LAB — MRSA NEXT GEN BY PCR, NASAL: MRSA by PCR Next Gen: NOT DETECTED

## 2021-09-23 LAB — PLATELET COUNT: Platelets: 402 10*3/uL — ABNORMAL HIGH (ref 150–400)

## 2021-09-23 LAB — TROPONIN I (HIGH SENSITIVITY): Troponin I (High Sensitivity): 17998 ng/L (ref <18)

## 2021-09-23 MED ORDER — HEPARIN (PORCINE) IN NACL 1000-0.9 UT/500ML-% IV SOLN
INTRAVENOUS | Status: AC
  Filled 2021-09-23: qty 500

## 2021-09-23 MED ORDER — PANTOPRAZOLE SODIUM 40 MG IV SOLR
40.0000 mg | Freq: Every day | INTRAVENOUS | Status: DC
  Administered 2021-09-23 – 2021-09-25 (×3): 40 mg via INTRAVENOUS
  Filled 2021-09-23 (×3): qty 40

## 2021-09-23 MED ORDER — FENTANYL 2500MCG IN NS 250ML (10MCG/ML) PREMIX INFUSION
50.0000 ug/h | INTRAVENOUS | Status: DC
  Administered 2021-09-23: 50 ug/h via INTRAVENOUS
  Administered 2021-09-24 – 2021-09-27 (×6): 150 ug/h via INTRAVENOUS
  Administered 2021-09-27 – 2021-09-28 (×2): 200 ug/h via INTRAVENOUS
  Filled 2021-09-23 (×9): qty 250

## 2021-09-23 MED ORDER — ASPIRIN 81 MG PO CHEW
81.0000 mg | CHEWABLE_TABLET | Freq: Every day | ORAL | Status: DC
  Administered 2021-09-24 – 2021-09-30 (×7): 81 mg
  Filled 2021-09-23 (×7): qty 1

## 2021-09-23 MED ORDER — FENTANYL BOLUS VIA INFUSION
50.0000 ug | INTRAVENOUS | Status: DC | PRN
  Administered 2021-09-23 (×2): 50 ug via INTRAVENOUS
  Administered 2021-09-23 – 2021-09-25 (×5): 100 ug via INTRAVENOUS
  Administered 2021-09-26 – 2021-09-27 (×5): 50 ug via INTRAVENOUS
  Administered 2021-09-27: 100 ug via INTRAVENOUS
  Administered 2021-09-27 (×2): 50 ug via INTRAVENOUS
  Administered 2021-09-27: 100 ug via INTRAVENOUS
  Administered 2021-09-27: 50 ug via INTRAVENOUS
  Administered 2021-09-27 – 2021-09-28 (×11): 100 ug via INTRAVENOUS
  Filled 2021-09-23: qty 100

## 2021-09-23 MED ORDER — OXYCODONE HCL 5 MG PO TABS
5.0000 mg | ORAL_TABLET | ORAL | Status: DC | PRN
  Administered 2021-09-30 – 2021-10-01 (×3): 5 mg via ORAL
  Filled 2021-09-23 (×3): qty 1

## 2021-09-23 MED ORDER — METOPROLOL TARTRATE 12.5 MG HALF TABLET: 12.5000 mg | ORAL_TABLET | Freq: Two times a day (BID) | ORAL | Status: DC

## 2021-09-23 MED ORDER — CISATRACURIUM BESYLATE 20 MG/10ML IV SOLN
0.1000 mg/kg | INTRAVENOUS | Status: DC | PRN
  Administered 2021-09-23 – 2021-09-24 (×9): 6.2 mg via INTRAVENOUS
  Filled 2021-09-23 (×3): qty 10

## 2021-09-23 MED ORDER — TIROFIBAN (AGGRASTAT) BOLUS VIA INFUSION
INTRAVENOUS | Status: DC | PRN
  Administered 2021-09-23: 1540 ug via INTRAVENOUS

## 2021-09-23 MED ORDER — SODIUM CHLORIDE 0.9 % IV SOLN
INTRAVENOUS | Status: AC | PRN
  Administered 2021-09-23: 10 mL/h via INTRAVENOUS

## 2021-09-23 MED ORDER — NOREPINEPHRINE 4 MG/250ML-% IV SOLN: 0.0000 ug/min | INTRAVENOUS | Status: DC

## 2021-09-23 MED ORDER — OXYCODONE-ACETAMINOPHEN 5-325 MG PO TABS
1.0000 | ORAL_TABLET | ORAL | Status: DC | PRN
  Administered 2021-09-23 – 2021-10-01 (×6): 1 via ORAL
  Filled 2021-09-23 (×6): qty 1

## 2021-09-23 MED ORDER — FENTANYL 2500MCG IN NS 250ML (10MCG/ML) PREMIX INFUSION
INTRAVENOUS | Status: AC | PRN
  Administered 2021-09-23: 50 ug/h via INTRAVENOUS

## 2021-09-23 MED ORDER — CHLORHEXIDINE GLUCONATE CLOTH 2 % EX PADS
6.0000 | MEDICATED_PAD | Freq: Every day | CUTANEOUS | Status: DC
  Administered 2021-09-23 – 2021-10-02 (×9): 6 via TOPICAL

## 2021-09-23 MED ORDER — ACETAMINOPHEN 325 MG PO TABS: 650.0000 mg | ORAL_TABLET | ORAL | Status: DC | PRN

## 2021-09-23 MED ORDER — MIDAZOLAM HCL 2 MG/2ML IJ SOLN
INTRAMUSCULAR | Status: AC
  Administered 2021-09-23: 4 mg via INTRAVENOUS
  Filled 2021-09-23: qty 4

## 2021-09-23 MED ORDER — FENTANYL 2500MCG IN NS 250ML (10MCG/ML) PREMIX INFUSION: 0.0000 ug/h | INTRAVENOUS | Status: DC

## 2021-09-23 MED ORDER — ARTIFICIAL TEARS OPHTHALMIC OINT
1.0000 "application " | TOPICAL_OINTMENT | Freq: Three times a day (TID) | OPHTHALMIC | Status: DC
  Administered 2021-09-23 – 2021-09-29 (×14): 1 via OPHTHALMIC
  Filled 2021-09-23 (×3): qty 3.5

## 2021-09-23 MED ORDER — METHYLPREDNISOLONE SODIUM SUCC 40 MG IJ SOLR
40.0000 mg | Freq: Once | INTRAMUSCULAR | Status: AC
Start: 2021-09-23 — End: 2021-09-23
  Administered 2021-09-23: 40 mg via INTRAVENOUS
  Filled 2021-09-23: qty 1

## 2021-09-23 MED ORDER — SODIUM CHLORIDE 0.9 % IV SOLN: 250.0000 mL | INTRAVENOUS | Status: DC | PRN

## 2021-09-23 MED ORDER — NOREPINEPHRINE 4 MG/250ML-% IV SOLN
INTRAVENOUS | Status: AC
  Filled 2021-09-23: qty 250

## 2021-09-23 MED ORDER — PROMETHAZINE HCL 25 MG RE SUPP: 25.0000 mg | Freq: Every day | RECTAL | Status: DC | PRN

## 2021-09-23 MED ORDER — PERFLUTREN LIPID MICROSPHERE
INTRAVENOUS | Status: AC
  Filled 2021-09-23: qty 10

## 2021-09-23 MED ORDER — ONDANSETRON HCL 4 MG/2ML IJ SOLN
4.0000 mg | Freq: Four times a day (QID) | INTRAMUSCULAR | Status: DC | PRN
  Administered 2021-09-28: 4 mg via INTRAVENOUS
  Filled 2021-09-23: qty 2

## 2021-09-23 MED ORDER — ATORVASTATIN CALCIUM 80 MG PO TABS
80.0000 mg | ORAL_TABLET | Freq: Every day | ORAL | Status: DC
  Filled 2021-09-23: qty 1

## 2021-09-23 MED ORDER — SODIUM CHLORIDE 0.9 % IV SOLN: 250.0000 mL | INTRAVENOUS | Status: DC

## 2021-09-23 MED ORDER — TIROFIBAN HCL IN NACL 5-0.9 MG/100ML-% IV SOLN
INTRAVENOUS | Status: AC | PRN
  Administered 2021-09-23: .15 ug/kg/min via INTRAVENOUS

## 2021-09-23 MED ORDER — NOREPINEPHRINE 4 MG/250ML-% IV SOLN
2.0000 ug/min | INTRAVENOUS | Status: DC
  Administered 2021-09-24: 9 ug/min via INTRAVENOUS
  Administered 2021-09-24: 2 ug/min via INTRAVENOUS
  Administered 2021-09-24 – 2021-09-25 (×2): 6 ug/min via INTRAVENOUS
  Administered 2021-09-26: 2 ug/min via INTRAVENOUS
  Filled 2021-09-23 (×5): qty 250

## 2021-09-23 MED ORDER — FENTANYL CITRATE PF 50 MCG/ML IJ SOSY: 100.0000 ug | PREFILLED_SYRINGE | Freq: Once | INTRAMUSCULAR | Status: AC

## 2021-09-23 MED ORDER — TICAGRELOR 90 MG PO TABS
90.0000 mg | ORAL_TABLET | Freq: Once | ORAL | Status: AC
  Administered 2021-09-23: 90 mg via ORAL
  Filled 2021-09-23: qty 1

## 2021-09-23 MED ORDER — REVEFENACIN 175 MCG/3ML IN SOLN
175.0000 ug | Freq: Every day | RESPIRATORY_TRACT | Status: DC
  Administered 2021-09-23 – 2021-10-02 (×10): 175 ug via RESPIRATORY_TRACT
  Filled 2021-09-23 (×10): qty 3

## 2021-09-23 MED ORDER — IOHEXOL 350 MG/ML SOLN
INTRAVENOUS | Status: DC | PRN
  Administered 2021-09-23: 90 mL

## 2021-09-23 MED ORDER — MAGNESIUM SULFATE 2 GM/50ML IV SOLN
2.0000 g | Freq: Once | INTRAVENOUS | Status: AC
  Administered 2021-09-23: 2 g via INTRAVENOUS
  Filled 2021-09-23: qty 50

## 2021-09-23 MED ORDER — FENTANYL CITRATE PF 50 MCG/ML IJ SOSY
PREFILLED_SYRINGE | INTRAMUSCULAR | Status: AC
  Administered 2021-09-23: 100 ug via INTRAVENOUS
  Filled 2021-09-23: qty 2

## 2021-09-23 MED ORDER — PROMETHAZINE HCL 25 MG PO TABS
25.0000 mg | ORAL_TABLET | Freq: Every day | ORAL | Status: DC | PRN
  Administered 2021-09-23 – 2021-10-02 (×4): 25 mg via ORAL
  Filled 2021-09-23 (×5): qty 1

## 2021-09-23 MED ORDER — HEPARIN SODIUM (PORCINE) 1000 UNIT/ML IJ SOLN
INTRAMUSCULAR | Status: DC | PRN
  Administered 2021-09-23: 4000 [IU] via INTRAVENOUS

## 2021-09-23 MED ORDER — ATORVASTATIN CALCIUM 80 MG PO TABS
80.0000 mg | ORAL_TABLET | Freq: Every day | ORAL | Status: DC
  Administered 2021-09-24 – 2021-09-30 (×7): 80 mg
  Filled 2021-09-23 (×7): qty 1

## 2021-09-23 MED ORDER — TICAGRELOR 90 MG PO TABS: 90.0000 mg | ORAL_TABLET | Freq: Two times a day (BID) | ORAL | Status: DC

## 2021-09-23 MED ORDER — TIROFIBAN HCL IN NACL 5-0.9 MG/100ML-% IV SOLN
0.1500 ug/kg/min | INTRAVENOUS | Status: AC
  Administered 2021-09-23: 0.15 ug/kg/min via INTRAVENOUS
  Filled 2021-09-23: qty 100

## 2021-09-23 MED ORDER — CHLORHEXIDINE GLUCONATE 0.12% ORAL RINSE (MEDLINE KIT)
15.0000 mL | Freq: Two times a day (BID) | OROMUCOSAL | Status: DC
  Administered 2021-09-23 – 2021-09-29 (×12): 15 mL via OROMUCOSAL

## 2021-09-23 MED ORDER — ASPIRIN 81 MG PO CHEW: 81.0000 mg | CHEWABLE_TABLET | ORAL | Status: DC

## 2021-09-23 MED ORDER — SODIUM CHLORIDE 0.9% FLUSH
3.0000 mL | INTRAVENOUS | Status: DC | PRN
  Administered 2021-09-25 – 2021-09-26 (×2): 3 mL via INTRAVENOUS

## 2021-09-23 MED ORDER — OXYCODONE-ACETAMINOPHEN 10-325 MG PO TABS: 1.0000 | ORAL_TABLET | ORAL | Status: DC | PRN

## 2021-09-23 MED ORDER — DEXTROSE 5 % IV SOLN
250.0000 mg | INTRAVENOUS | Status: DC
  Administered 2021-09-23 – 2021-09-25 (×3): 250 mg via INTRAVENOUS
  Filled 2021-09-23 (×5): qty 2.5

## 2021-09-23 MED ORDER — HEPARIN (PORCINE) IN NACL 2000-0.9 UNIT/L-% IV SOLN: INTRAVENOUS | Status: DC | PRN

## 2021-09-23 MED ORDER — DOCUSATE SODIUM 50 MG/5ML PO LIQD
100.0000 mg | Freq: Two times a day (BID) | ORAL | Status: DC
  Administered 2021-09-23 – 2021-09-29 (×12): 100 mg
  Filled 2021-09-23 (×12): qty 10

## 2021-09-23 MED ORDER — VANCOMYCIN HCL IN DEXTROSE 1-5 GM/200ML-% IV SOLN
INTRAVENOUS | Status: AC
  Filled 2021-09-23: qty 200

## 2021-09-23 MED ORDER — MIDAZOLAM-SODIUM CHLORIDE 100-0.9 MG/100ML-% IV SOLN
2.0000 mg/h | INTRAVENOUS | Status: DC
  Administered 2021-09-23: 2 mg/h via INTRAVENOUS
  Administered 2021-09-24: 5 mg/h via INTRAVENOUS
  Administered 2021-09-25 (×2): 6 mg/h via INTRAVENOUS
  Administered 2021-09-26: 2 mg/h via INTRAVENOUS
  Administered 2021-09-26 (×2): 4 mg/h via INTRAVENOUS
  Administered 2021-09-26: 5 mg/h via INTRAVENOUS
  Filled 2021-09-23 (×6): qty 100

## 2021-09-23 MED ORDER — PERFLUTREN LIPID MICROSPHERE
1.0000 mL | INTRAVENOUS | Status: AC | PRN
Start: 2021-09-23 — End: 2021-09-23
  Administered 2021-09-23: 2 mL via INTRAVENOUS
  Filled 2021-09-23: qty 10

## 2021-09-23 MED ORDER — METOPROLOL TARTRATE 12.5 MG HALF TABLET
12.5000 mg | ORAL_TABLET | Freq: Two times a day (BID) | ORAL | Status: DC
  Administered 2021-09-23: 12.5 mg via ORAL
  Filled 2021-09-23: qty 1

## 2021-09-23 MED ORDER — HYDRALAZINE HCL 20 MG/ML IJ SOLN: 5.0000 mg | INTRAMUSCULAR | Status: AC | PRN

## 2021-09-23 MED ORDER — ROCURONIUM BROMIDE 10 MG/ML (PF) SYRINGE
PREFILLED_SYRINGE | INTRAVENOUS | Status: AC
  Administered 2021-09-23: 60 mg
  Filled 2021-09-23: qty 10

## 2021-09-23 MED ORDER — HEPARIN SODIUM (PORCINE) 5000 UNIT/ML IJ SOLN
5000.0000 [IU] | Freq: Three times a day (TID) | INTRAMUSCULAR | Status: DC
  Administered 2021-09-23 – 2021-10-01 (×24): 5000 [IU] via SUBCUTANEOUS
  Filled 2021-09-23 (×23): qty 1

## 2021-09-23 MED ORDER — SODIUM CHLORIDE 0.9% FLUSH
3.0000 mL | Freq: Two times a day (BID) | INTRAVENOUS | Status: DC
  Administered 2021-09-23 – 2021-09-29 (×13): 3 mL via INTRAVENOUS

## 2021-09-23 MED ORDER — ORAL CARE MOUTH RINSE
15.0000 mL | Freq: Two times a day (BID) | OROMUCOSAL | Status: DC
  Administered 2021-09-23: 15 mL via OROMUCOSAL

## 2021-09-23 MED ORDER — VITAL AF 1.2 CAL PO LIQD
1000.0000 mL | ORAL | Status: DC
  Administered 2021-09-23 – 2021-09-28 (×6): 1000 mL
  Filled 2021-09-23: qty 1000

## 2021-09-23 MED ORDER — MIDAZOLAM BOLUS VIA INFUSION: 4.0000 mg | Freq: Once | INTRAVENOUS | Status: DC

## 2021-09-23 MED ORDER — POLYETHYLENE GLYCOL 3350 17 G PO PACK
17.0000 g | PACK | Freq: Every day | ORAL | Status: DC
  Administered 2021-09-23 – 2021-09-29 (×6): 17 g
  Filled 2021-09-23 (×7): qty 1

## 2021-09-23 MED ORDER — VANCOMYCIN HCL 500 MG/100ML IV SOLN
INTRAVENOUS | Status: AC | PRN
  Administered 2021-09-23: 1000 mg via INTRAVENOUS

## 2021-09-23 MED ORDER — ASPIRIN 81 MG PO CHEW
81.0000 mg | CHEWABLE_TABLET | Freq: Every day | ORAL | Status: DC
  Filled 2021-09-23: qty 1

## 2021-09-23 MED ORDER — ROCURONIUM BROMIDE 50 MG/5ML IV SOLN: 60.0000 mg | Freq: Once | INTRAVENOUS | Status: AC

## 2021-09-23 MED ORDER — TICAGRELOR 90 MG PO TABS
90.0000 mg | ORAL_TABLET | Freq: Two times a day (BID) | ORAL | Status: DC
  Administered 2021-09-24 – 2021-09-29 (×12): 90 mg
  Filled 2021-09-23 (×13): qty 1

## 2021-09-23 MED ORDER — BUDESONIDE 0.25 MG/2ML IN SUSP
0.2500 mg | Freq: Two times a day (BID) | RESPIRATORY_TRACT | Status: DC
  Administered 2021-09-23 – 2021-10-02 (×18): 0.25 mg via RESPIRATORY_TRACT
  Filled 2021-09-23 (×19): qty 2

## 2021-09-23 MED ORDER — SODIUM CHLORIDE 0.9 % IV SOLN: INTRAVENOUS | Status: AC

## 2021-09-23 MED ORDER — HEPARIN (PORCINE) IN NACL 1000-0.9 UT/500ML-% IV SOLN
INTRAVENOUS | Status: DC | PRN
  Administered 2021-09-23 (×2): 500 mL

## 2021-09-23 MED ORDER — MIDAZOLAM BOLUS VIA INFUSION
1.0000 mg | INTRAVENOUS | Status: DC | PRN
  Administered 2021-09-23 – 2021-09-25 (×6): 2 mg via INTRAVENOUS
  Administered 2021-09-25: 1 mg via INTRAVENOUS
  Administered 2021-09-25 – 2021-09-26 (×2): 2 mg via INTRAVENOUS
  Filled 2021-09-23: qty 2

## 2021-09-23 MED ORDER — TICAGRELOR 90 MG PO TABS
180.0000 mg | ORAL_TABLET | Freq: Once | ORAL | Status: AC
  Administered 2021-09-23: 180 mg via ORAL
  Filled 2021-09-23: qty 2

## 2021-09-23 MED ORDER — MIDAZOLAM HCL 2 MG/2ML IJ SOLN: 2.0000 mg | INTRAMUSCULAR | Status: DC | PRN

## 2021-09-23 MED ORDER — IPRATROPIUM-ALBUTEROL 0.5-2.5 (3) MG/3ML IN SOLN
3.0000 mL | RESPIRATORY_TRACT | Status: DC
  Administered 2021-09-23 – 2021-09-27 (×24): 3 mL via RESPIRATORY_TRACT
  Filled 2021-09-23 (×24): qty 3

## 2021-09-23 MED ORDER — MIDAZOLAM HCL 2 MG/2ML IJ SOLN
2.0000 mg | INTRAMUSCULAR | Status: DC | PRN
  Administered 2021-09-23: 2 mg via INTRAVENOUS
  Filled 2021-09-23 (×2): qty 2

## 2021-09-23 MED ORDER — ARFORMOTEROL TARTRATE 15 MCG/2ML IN NEBU
15.0000 ug | INHALATION_SOLUTION | Freq: Two times a day (BID) | RESPIRATORY_TRACT | Status: DC
  Administered 2021-09-23 – 2021-10-02 (×18): 15 ug via RESPIRATORY_TRACT
  Filled 2021-09-23 (×19): qty 2

## 2021-09-23 MED ORDER — MIDAZOLAM HCL 2 MG/2ML IJ SOLN: 4.0000 mg | Freq: Once | INTRAMUSCULAR | Status: AC

## 2021-09-23 MED ORDER — LIDOCAINE HCL (PF) 1 % IJ SOLN
INTRAMUSCULAR | Status: AC
  Filled 2021-09-23: qty 30

## 2021-09-23 MED ORDER — FENTANYL CITRATE PF 50 MCG/ML IJ SOSY
50.0000 ug | PREFILLED_SYRINGE | Freq: Once | INTRAMUSCULAR | Status: DC
  Filled 2021-09-23: qty 1

## 2021-09-23 MED ORDER — ORAL CARE MOUTH RINSE
15.0000 mL | OROMUCOSAL | Status: DC
  Administered 2021-09-23 – 2021-09-29 (×50): 15 mL via OROMUCOSAL

## 2021-09-23 MED ORDER — INSULIN ASPART 100 UNIT/ML IJ SOLN
0.0000 [IU] | INTRAMUSCULAR | Status: DC
  Administered 2021-09-23: 3 [IU] via SUBCUTANEOUS
  Administered 2021-09-23: 5 [IU] via SUBCUTANEOUS
  Administered 2021-09-23: 3 [IU] via SUBCUTANEOUS
  Administered 2021-09-24 (×2): 8 [IU] via SUBCUTANEOUS
  Administered 2021-09-24: 5 [IU] via SUBCUTANEOUS
  Administered 2021-09-24: 3 [IU] via SUBCUTANEOUS
  Administered 2021-09-24: 8 [IU] via SUBCUTANEOUS
  Administered 2021-09-24: 2 [IU] via SUBCUTANEOUS
  Administered 2021-09-25: 11 [IU] via SUBCUTANEOUS
  Administered 2021-09-25: 5 [IU] via SUBCUTANEOUS
  Administered 2021-09-25: 2 [IU] via SUBCUTANEOUS
  Administered 2021-09-25: 5 [IU] via SUBCUTANEOUS
  Administered 2021-09-25: 11 [IU] via SUBCUTANEOUS
  Administered 2021-09-25: 5 [IU] via SUBCUTANEOUS
  Administered 2021-09-26: 3 [IU] via SUBCUTANEOUS
  Administered 2021-09-26: 5 [IU] via SUBCUTANEOUS
  Administered 2021-09-26: 2 [IU] via SUBCUTANEOUS
  Administered 2021-09-26: 5 [IU] via SUBCUTANEOUS
  Administered 2021-09-26: 3 [IU] via SUBCUTANEOUS
  Administered 2021-09-26: 8 [IU] via SUBCUTANEOUS
  Administered 2021-09-27: 2 [IU] via SUBCUTANEOUS
  Administered 2021-09-27: 5 [IU] via SUBCUTANEOUS
  Administered 2021-09-27 – 2021-09-28 (×4): 3 [IU] via SUBCUTANEOUS
  Administered 2021-09-28: 11 [IU] via SUBCUTANEOUS
  Administered 2021-09-28 – 2021-09-29 (×4): 3 [IU] via SUBCUTANEOUS
  Administered 2021-09-29: 2 [IU] via SUBCUTANEOUS

## 2021-09-23 NOTE — Progress Notes (Signed)
Heart Failure Nurse Navigator Progress Note  Following this hospitalization to assess for HV TOC readiness. Pt received cortrak today. Reintubated today. Pt followed this hospitalization by [facility], pt has been seen by [facility] and [facility] cardiology within the past year as well.  Will continue to follow this hospitalization, pt not ready for assessment at this time.   Ozella Rocks, MSN, RN Heart Failure Nurse Navigator [phone]

## 2021-09-23 NOTE — Progress Notes (Signed)
ABG results given to DR Chestine Spore at bedside, MD adjusted vent settings.

## 2021-09-23 NOTE — H&P (Signed)
Duplicate

## 2021-09-23 NOTE — Discharge Instructions (Addendum)
Information about your medication: Brilinta (anti-platelet agent)  Generic Name (Brand): ticagrelor (Brilinta), twice daily medication  PURPOSE: You are taking this medication along with aspirin to lower your chance of having a heart attack, stroke, or blood clots in your heart stent. These can be fatal. Brilinta and aspirin help prevent platelets from sticking together and forming a clot that can block an artery or your stent.   Common SIDE EFFECTS you may experience include: bruising or bleeding more easily, shortness of breath  Do not stop taking BRILINTA without talking to the doctor who prescribes it for you. People who are treated with a stent and stop taking Brilinta too soon, have a higher risk of getting a blood clot in the stent, having a heart attack, or dying. If you stop Brilinta because of bleeding, or for other reasons, your risk of a heart attack or stroke may increase.   Avoid taking NSAID agents or anti-inflammatory medications such as ibuprofen, naproxen given increased bleed risk with plavix - can use acetaminophen (Tylenol) if needed for pain.  Tell all of your doctors and dentists that you are taking Brilinta. They should talk to the doctor who prescribed Brilinta for you before you have any surgery or invasive procedure.   Contact your health care provider if you experience: severe or uncontrollable bleeding, pink/red/brown urine, vomiting blood or vomit that looks like "coffee grounds", red or black stools (looks like tar), coughing up blood or blood clots ----------------------------------------------------------------------------------------------------------------------   Information on my medicine - ELIQUIS (apixaban)   Why was Eliquis prescribed for you? Eliquis was prescribed for you to reduce the risk of a blood clot forming that can cause a stroke if you have a medical condition called atrial fibrillation (a type of irregular heartbeat).  What do You need to  know about Eliquis ? Take your Eliquis TWICE DAILY - one tablet in the morning and one tablet in the evening with or without food. If you have difficulty swallowing the tablet whole please discuss with your pharmacist how to take the medication safely.  Take Eliquis exactly as prescribed by your doctor and DO NOT stop taking Eliquis without talking to the doctor who prescribed the medication.  Stopping may increase your risk of developing a stroke.  Refill your prescription before you run out.  After discharge, you should have regular check-up appointments with your healthcare provider that is prescribing your Eliquis.  In the future your dose may need to be changed if your kidney function or weight changes by a significant amount or as you get older.  What do you do if you miss a dose? If you miss a dose, take it as soon as you remember on the same day and resume taking twice daily.  Do not take more than one dose of ELIQUIS at the same time to make up a missed dose.  Important Safety Information A possible side effect of Eliquis is bleeding. You should call your healthcare provider right away if you experience any of the following: Bleeding from an injury or your nose that does not stop. Unusual colored urine (red or dark brown) or unusual colored stools (red or black). Unusual bruising for unknown reasons. A serious fall or if you hit your head (even if there is no bleeding).  Some medicines may interact with Eliquis and might increase your risk of bleeding or clotting while on Eliquis. To help avoid this, consult your healthcare provider or pharmacist prior to using any new prescription or non-prescription  medications, including herbals, vitamins, non-steroidal anti-inflammatory drugs (NSAIDs) and supplements.  This website has more information on Eliquis (apixaban): http://www.eliquis.com/eliquis/home

## 2021-09-23 NOTE — Progress Notes (Signed)
BP soft. Continue vent support for now and will assess diuresis in the morning.   Yates Decamp, MD, [facility] 09/23/2021, 9:24 PM Office: [phone] Fax: [phone] Pager: [phone]

## 2021-09-23 NOTE — Progress Notes (Signed)
Patient admitted to CVICU at 0130. Patient was still on ventilator and only on a fentanyl and tirofiban drips. Received verbal orders from [facility] MD to insert a foley and OGT so patient can get per tube medications. Per Jacinto Halim MD, d/c tirofiban gtt after 6 hours. After inserting a foley, CCM PA came to bedside and wanted [place] to work to extubate the patient. Patient extubated at 0258 to 4L [place], O2 sats stable throughout. Will continue to monitor throughout shift.

## 2021-09-23 NOTE — Progress Notes (Addendum)
Called to bedside, wheezing, desaturating to mid-80s. Takes Stiolto at home.  BP 90/63   Pulse (!) 107   Temp 98 F (36.7 C) (Oral)   Resp (!) 25   Wt 61.3 kg   SpO2 (!) 89%   BMI 25.53 kg/m  In respiratory distress using accessory muscles- inspiratory & expiratory Barrel chest Diminished bilateral breath sounds, no wheezing Tachycardic  Duonebs Q4h, BiPAP. RT & RN at bedside. If not responding to therapy will need to be reintubated.  Steffanie Dunn, DO 09/23/21 9:13 AM [facility]   BP (!) 84/52   Pulse (!) 106   Temp 98 F (36.7 C) (Oral)   Resp (!) 23   Wt 61.3 kg   SpO2 92%   BMI 25.53 kg/m   BP 109/67 Reassessed on bipap-- looks slightly better, saturations 92%. Less accessory muscle use, now resting. Still reduced breath sounds.  Remain on bipap, may need several stacked nebs. Not ready to start home stiolto yet. Will start brovana and yupelri this afternoon.  CXR ordered Hold on systemic steroids for now given acute STEMI & concern for mechanical complications. Starting budesonide nebs.  Steffanie Dunn, DO 09/23/21 9:37 AM [facility]

## 2021-09-23 NOTE — Progress Notes (Signed)
MD at bedside to eval pt re:  breath stacking and auto PEEP (auto PEEP reading between 12-16).  Per discussion w/ MD, set vent rate reduced to 26 and hold off on 1730 ABG. Change ABG to be drawn 2 hours s/p paralytics per MD.  This was discussed w/ RN.

## 2021-09-23 NOTE — Progress Notes (Signed)
Received protocol consult for US guided IV triggered by vasopressor order. Vasopressor not currently infusing. Securechat sent to RN, Anthea, who agrees to consult for IV if/when vasopressor is needed.

## 2021-09-23 NOTE — TOC Benefit Eligibility Note (Signed)
Patient Product/process development scientist completed.    The patient is currently admitted and upon discharge could be taking Brilinta 90 mg.  The current 30 day co-pay is, $200.00, due to a $4,950.00 deductible remaining.  The patient is currently admitted and upon discharge could be taking Jardiance 10 mg.  The current 30 day co-pay is, $200.00., due to a $4,950.00 deductible remaining   The patient is currently admitted and upon discharge could be taking Entresto 24-26 mg.  The current 30 day co-pay is, $200.00, due to a $4,950.00 deductible remaining.   The patient is currently admitted and upon discharge could be taking Farxiga 10 mg.  The current 30 day co-pay is, $75.00 AstraZeneca, the mfr of FARXIGA 10 MG TABLET paid 125.00 toward plan copay.   The patient is insured through [facility]    Roland Earl, CPhT Pharmacy Patient Advocate Specialist [facility] Patient Advocate Team Direct Number: [phone]  Fax: [phone]

## 2021-09-23 NOTE — Procedures (Signed)
Extubation Procedure Note  Patient Details:   Name: [name] DOB: [birth year] MRN: [id number]   Airway Documentation:    Vent end date: 09/23/21 Vent end time: 0258   Evaluation  O2 sats: stable throughout Complications: No apparent complications Patient did tolerate procedure well.     Yes  Berton Bon 09/23/2021, 2:58 AM

## 2021-09-23 NOTE — Procedures (Signed)
Cortrak  Person Inserting Tube:  Katrina Daddona L, RD Tube Type:  Cortrak - 43 inches Tube Size:  10 Tube Location:  Right nare Initial Placement:  Stomach Secured by: Bridle Technique Used to Measure Tube Placement:  Marking at nare/corner of mouth Cortrak Secured At:  65 cm  Cortrak Tube Team Note:  Consult received to place a Cortrak feeding tube.   X-ray is required, abdominal x-ray has been ordered by the Cortrak team. Please confirm tube placement before using the Cortrak tube.   If the tube becomes dislodged please keep the tube and contact the Cortrak team at [email or web address] ([email or web address]) for replacement.  If after hours and replacement cannot be delayed, place a NG tube and confirm placement with an abdominal x-ray.    Hannia Matchett BS, RD, LDN Clinical Dietitian See AMiON for contact information.    

## 2021-09-23 NOTE — Progress Notes (Signed)
Subjective:  Patient is now extubated and responsive to questions. Reports continued shortness of breath, however feels it is improved compared to yesterday. Denies chest pain BNP 1323  Intake/Output from previous day:  I/O last 3 completed shifts: In: 425.2 [I.V.:425.2] Out: 385 [Urine:385] No intake/output data recorded.  Blood pressure (!) 108/52, pulse (!) 102, temperature 98 F (36.7 C), temperature source Oral, resp. rate 18, weight 61.3 kg, SpO2 99 %. Physical Exam Vitals reviewed.  Constitutional:      Comments: Presently extubated, on 4L via [place]  HENT:     Head: Normocephalic and atraumatic.  Cardiovascular:     Rate and Rhythm: Normal rate and regular rhythm.     Pulses: Intact distal pulses.          Popliteal pulses are 0 on the right side and 0 on the left side.       Dorsalis pedis pulses are 0 on the right side and 0 on the left side.       Posterior tibial pulses are 0 on the right side and 0 on the left side.     Heart sounds: S1 normal and S2 normal. Heart sounds are distant. No murmur heard.   No gallop.  Pulmonary:     Effort: Pulmonary effort is normal. No respiratory distress.     Breath sounds: No rhonchi (bilaterally).  Musculoskeletal:     Right lower leg: No edema.     Left lower leg: No edema.  Skin:    General: Skin is warm and dry.     Capillary Refill: Capillary refill takes less than 2 seconds.  Neurological:     Mental Status: She is alert and oriented to person, place, and time.   Lab Results: BMP BNP (last 3 results) Recent Labs    09/23/21 0613  BNP 1,323.2*    ProBNP (last 3 results) No results for input(s): PROBNP in the last 8760 hours. BMP Latest Ref Rng & Units 09/23/2021 09/23/2021 09/23/2021  Glucose 70 - 99 mg/dL - 284(H) 251(H)  BUN 8 - 23 mg/dL - 11 11  Creatinine 0.44 - 1.00 mg/dL - 1.20(H) 1.30(H)  Sodium 135 - 145 mmol/L 135 130(L) 131(L)  Potassium 3.5 - 5.1 mmol/L 4.4 4.0 4.2  Chloride 98 - 111 mmol/L - 91(L)  90(L)  CO2 22 - 32 mmol/L - 26 -  Calcium 8.9 - 10.3 mg/dL - 7.9(L) -   Hepatic Function Latest Ref Rng & Units 03/29/2020 03/22/2020 02/21/2020  Total Protein 6.5 - 8.1 g/dL 6.8 8.0 7.5  Albumin 3.5 - 5.0 g/dL 3.7 4.3 4.2  AST 15 - 41 U/L 27 40 15  ALT 0 - 44 U/L _0 Alk Phosphatase 38 - 126 U/L 98 121 95  Total Bilirubin 0.3 - 1.2 mg/dL 1.0 0.6 0.5  Bilirubin, Direct 0.0 - 0.3 mg/dL - - -   CBC Latest Ref Rng & Units 09/23/2021 09/23/2021 09/23/2021  WBC 4.0 - 10.5 K/uL - - 24.3(H)  Hemoglobin 12.0 - 15.0 g/dL 9.9(L) - 8.8(L)  Hematocrit 36.0 - 46.0 % 29.0(L) - 28.2(L)  Platelets 150 - 400 K/uL - 402(H) 413(H)   Lipid Panel     Component Value Date/Time   CHOL 230 (H) 10/24/2009 1152   TRIG 146.0 10/24/2009 1152   HDL 39.30 10/24/2009 1152   CHOLHDL 6 10/24/2009 1152   VLDL 29.2 10/24/2009 1152   LDLDIRECT 173.8 10/24/2009 1152   Cardiac Panel (last 3 results) No results for input(s): CKTOTAL,  CKMB, TROPONINI, RELINDX in the last 72 hours.  HEMOGLOBIN A1C Lab Results  Component Value Date   HGBA1C 6.7 (H) 09/17/2021   MPG 146 09/17/2021   TSH No results for input(s): TSH in the last 8760 hours.  Imaging: Imaging results have been reviewed  Cardiac Studies: Left Heart Catheterization 09/23/21:  RCA diffuse in the proximal and mid RCA with ulceration and scattered 80-99% stenosis.  Successful PCI to ulcerated high grade native RCA with implantation of 3.0x48 mm Synergy SD in the proximal to mid RCA. Timi 3 to 3 flow. LM diffuse 80% stenosis.  LAD patent with mild disease and LIMA to LAD patent.  CX: Moderate OM1 widely patent. Cx severely diseased after origin of OM 1 with 90-95% stenosis.  Small diseased OM-2 RI: Small with ostial mild disease.  SVG to OM1-2 skip graft occluded, SVG to RCA occluded.  LV: EF 35-40% with inferior akinesis. No MR.  Hemodynamics: LV 112/18, EDP 25 mm Hg. Ao 114/80, mean 112 mm Hg. No pressure gradient across the AV.  90 mL contrast  used  Echocardiogram 03/24/2020:     1. Left ventricular ejection fraction, by estimation, is 55 to 60%. The  left ventricle has normal function. The left ventricle has no regional  wall motion abnormalities. There is moderate left ventricular hypertrophy.  Left ventricular diastolic parameters are consistent with Grade I diastolic dysfunction (impaired relaxation).   2. Right ventricular systolic function is normal. The right ventricular  size is normal. Tricuspid regurgitation signal is inadequate for assessing PA pressure.   3. The mitral valve is normal in structure. No evidence of mitral valve regurgitation. No evidence of mitral stenosis.   4. The aortic valve is normal in structure. Aortic valve regurgitation is not visualized. No aortic stenosis is present.   5. The inferior vena cava is normal in size with greater than 50%  respiratory variability, suggesting right atrial pressure of 3 mmHg.   CABG 04/29/2016: LIMA to LAD, SVG to RCA, SVG to OM1, SVG to OM2 for left main 90% stenosis 50% stenosis in circumflex and 60% stenosis in OM1, 50% stenosis in proximal RCA.  EKG 09/22/21 at 22:59 hours: Acute injury pattern in the inferior leads with reciprocal ST depression in lateral leads. Underlying Mobitz I AV Block.     03/29/2020: Sinus tachycardia at rate of 106 bpm, poor R wave progression, probably normal variant.  No evidence of ischemia.   Scheduled Meds:  arformoterol  15 mcg Nebulization BID   [START ON 09/24/2021] aspirin  81 mg Per Tube Daily   atorvastatin  80 mg Per Tube Daily   budesonide (PULMICORT) nebulizer solution  0.25 mg Nebulization BID   Chlorhexidine Gluconate Cloth  6 each Topical Daily   docusate  100 mg Per Tube BID   fentaNYL (SUBLIMAZE) injection  50 mcg Intravenous Once   heparin  5,000 Units Subcutaneous Q8H   insulin aspart  0-15 Units Subcutaneous Q4H   ipratropium-albuterol  3 mL Nebulization Q4H   mouth rinse  15 mL Mouth Rinse BID   metoprolol  tartrate  12.5 mg Per Tube BID   pantoprazole (PROTONIX) IV  40 mg Intravenous Daily   polyethylene glycol  17 g Per Tube Daily   revefenacin  175 mcg Nebulization Daily   sodium chloride flush  3 mL Intravenous Q12H   [START ON 09/24/2021] ticagrelor  90 mg Per Tube BID   ticagrelor  90 mg Oral Once   Continuous Infusions:  sodium chloride  fentaNYL infusion INTRAVENOUS 50 mcg/hr (09/23/21 1116)   PRN Meds:.sodium chloride, acetaminophen, fentaNYL, midazolam, midazolam, ondansetron (ZOFRAN) IV, oxyCODONE-acetaminophen **AND** oxyCODONE, promethazine, sodium chloride flush  Assessment/Plan:  Inferior lateral STEMI status post RCA stenting Recommend uninterrupted dual antiplatelet therapy with Aspirin 84m daily and Ticagrelor 945mtwice daily for a minimum of 12 months  Continue statin therapy, atorvastatin 80 mg daily Patient is on low dose beta-blocker, will continue this for now. However, given low BP can discontinue Lopressor if needed.  No ARB or ACE inhibitor at this time given hypotension and creatinine is mildly elevated from baseline at 1.2. Echocardiogram pending  Hypoxia Likely multifactorial in the setting of acute STEMI as well as patient's underlying COPD contributing Echocardiogram pending Appreciate PCCM recommendations.  Diabetes mellitus Continue glycemic control protocol  Hyperlipidemia Continue statin therapy  COPD Contributing to patient's hypoxia.  Appreciate PCCM recommendations   Patient was seen in collaboration with Dr. GaEinar GipHe also reviewed patient's chart and examined the patient. Dr. GaEinar Gips in agreement of the plan.    CeAlethia BertholdPA-C 09/23/2021, 12:23 PM Office: [phone] 09/23/2021:  Following exam this morning patient desaturated requiring BiPAP and with no improvement/increasing oxygen requirements subsequent reintubation by PCCM.    CeAlethia BertholdPA-C 09/23/2021, 12:26 PM Office: [phone]

## 2021-09-23 NOTE — Progress Notes (Signed)
Date and time results received: 09/23/21 0746 (use smartphrase ".now" to insert current time)  Test: Troponin Critical Value: 17,998  Name of Provider Notified: Dr. Karie Fetch  Orders Received? Or Actions Taken?:  MD aware

## 2021-09-23 NOTE — Progress Notes (Addendum)
Despite bipap support and initially responding, she began having rapidly escalating oxygen requirements. Decision made to reintubate. Not hypotensive currently, but norepi available if hypotension results. Cont' duonebs this morning, start long-acting Bds this afternoon. Vent adjusted-- needs long e-time, reduced rate. 18/380/8/100%, ABG in 1 hour. CXR pending. Spouse Remi Deter updated via phone.  Additional CC time today: 45 min.  Steffanie Dunn, DO 09/23/21 10:13 AM [facility]

## 2021-09-23 NOTE — Progress Notes (Signed)
Wasted 230 mL of fentanyl in stericycle with Anthea, RN. Med did not show up in pyxis

## 2021-09-23 NOTE — Progress Notes (Signed)
Initial Nutrition Assessment  DOCUMENTATION CODES:   Not applicable  INTERVENTION:   Tube Feeding via Cortrak:  Vital AF 1.2 at 55 ml/hr Provides 1584 kacls, 99 g of protein and 1069 mL of free water   NUTRITION DIAGNOSIS:   Inadequate oral intake related to acute illness as evidenced by NPO status.  GOAL:   Patient will meet greater than or equal to 90% of their needs   MONITOR:   Vent status, Labs, Weight trends, TF tolerance  REASON FOR ASSESSMENT:   Consult, Ventilator Enteral/tube feeding initiation and management  ASSESSMENT:   65 yo female admitted with acute inferior STEMI, acute respiratory failure secondary to PMH includes COPD, IBS, HTN, DM, CKD 3  12/07 Extubated, Re-Intubated  Patient is currently intubated on ventilator support MV: 7.9 L/min Temp (24hrs), Avg:98 F (36.7 C), Min:98 F (36.7 C), Max:98 F (36.7 C)  RN unable to pass OG, plan to attempt Cortrak today  Current wt 61.3 kg  Unable to obtain diet and weight history at this time  Labs: sodium 130 (L), Creatinine 1.20,  CBG 105-263 Meds: reviewed   Diet Order:   Diet Order             Diet NPO time specified  Diet effective now                   EDUCATION NEEDS:   Not appropriate for education at this time  Skin:  Skin Assessment: Reviewed RN Assessment  Last BM:  PTA  Height:   Ht Readings from Last 1 Encounters:  09/16/21 5\' 1"  (1.549 m)    Weight:   Wt Readings from Last 1 Encounters:  09/23/21 61.3 kg    BMI:  Body mass index is 25.53 kg/m.  Estimated Nutritional Needs:   Kcal:  1550-1830 kcals  Protein:  90-120 g  Fluid:  >/= 1.5 L   14/07/22 MS, RDN, LDN, CNSC Registered Dietitian III Clinical Nutrition RD Pager and On-Call Pager Number Located in [place]

## 2021-09-23 NOTE — Procedures (Signed)
Intubation Procedure Note  [name]  [id number]  09/08/1956  Date:09/23/21  Time:10:10 AM   Provider Performing:Chanele Douglas P Chestine Spore    Procedure: Intubation (31500)  Indication(s) Respiratory Failure  Consent Unable to obtain consent due to emergent nature of procedure.   Anesthesia Versed, Fentanyl, and Rocuronium   Time Out Verified patient identification, verified procedure, site/side was marked, verified correct patient position, special equipment/implants available, medications/allergies/relevant history reviewed, required imaging and test results available.   Sterile Technique Usual hand hygeine, masks, and gloves were used   Procedure Description Patient positioned in bed supine.  Sedation given as noted above.  Patient was intubated with endotracheal tube using Glidescope.  View was Grade 1 full glottis .  Number of attempts was 1.  Colorimetric CO2 detector was consistent with tracheal placement.   Complications/Tolerance None; patient tolerated the procedure well. Chest X-ray is ordered to verify placement.   EBL Minimal   Specimen(s) None  Steffanie Dunn, DO 09/23/21 10:10 AM [facility]

## 2021-09-23 NOTE — Consult Note (Signed)
NAME:  [name], MRN:  [id number], DOB:  [birth year], LOS: 0 ADMISSION DATE:  09/23/2021, CONSULTATION DATE:  09/23/21 REFERRING MD:  Jacinto Halim CHIEF COMPLAINT:  STEMI   History of Present Illness:  Pt is encephelopathic; therefore, this HPI is obtained from chart review. [name] is a 65 y.o. female who has a PMH as outlined below including CAD s/p CABG x 4, HTN, HLD, DM, COPD.  She presented to [facility] ED 12/7 after husband found at her at home unresponsive.  EMS was called and she was intubated in the field prior to transport to ED where she was found to have inferolateral STEMI.  She was subsequently transferred to [facility] for emergent PCI.  She was taken to cath lab and had stenting of the RCA (procedure note pending).  She then was transferred to the CVICU where PCCM asked to see in consultation. Upon our arrival, she is awake, following all commands on vent.  She has been weaning on PSV 5/5 and tolerating well.  Pertinent  Medical History:  has CLOSTRIDIUM DIFFICILE COLITIS; Hyperlipidemia; ANXIETY; ANXIETY DEPRESSION; TOBACCO ABUSE; Migraine; PERIPHERAL NEUROPATHY; ENDOTHELIAL CORNEAL DYSTROPHY; DISEASE, HYPERTENSIVE HEART NOS, W/O HF; PERIPHERAL VASCULAR DISEASE; DYSPLASTIC NEVUS, BACK; ASTHMA; GERD; DIVERTICULOSIS, COLON; IRRITABLE BOWEL SYNDROME; ACUTE CYSTITIS; MICROSCOPIC HEMATURIA; BREAST MASS; SEBACEOUS CYST; DEGENERATIVE DISC DISEASE, LUMBAR SPINE; BACK PAIN, LUMBAR; BACK PAIN, CHRONIC; WEIGHT LOSS; OTHER DYSPHAGIA; ABDOMINAL PAIN -GENERALIZED; GASTROINTESTINAL XRAY, ABNORMAL; CONTUSION, HIP, RIGHT; COPD exacerbation (HCC); Acute respiratory failure with hypoxia (HCC); 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HCC); Essential hypertension; Chronic pain syndrome; Overweight (BMI 25.0-29.9); Coronary artery disease involving native coronary artery of native heart without angina pectoris; Palpitations; Renal artery stenosis, non-flow-limiting (HCC); Hx of CABG; Asthma exacerbation; Anaphylactic  reaction; Hypertensive urgency; Paroxysmal tachycardia, unspecified (HCC); Acute diverticulitis; COPD with chronic bronchitis (HCC); Sepsis (HCC); Diverticulitis; H/O total shoulder replacement, left; Acute ST elevation myocardial infarction (STEMI) of inferior wall (HCC); and Acute ST elevation myocardial infarction (STEMI) involving other coronary artery of inferior wall (HCC) on their problem list.  Significant Hospital Events: Including procedures, antibiotic start and stop dates in addition to other pertinent events   12/7 admitted with inferolateral STEMI, taken for PCI of RCA  Interim History / Subjective:  Awake on vent, follows commands.  Objective:  Blood pressure 117/67, pulse 91, resp. rate (!) 43, SpO2 100 %.    Vent Mode: PRVC FiO2 (%):  [80 %-100 %] 80 % Set Rate:  [20 bmp-22 bmp] 22 bmp Vt Set:  [380 mL-500 mL] 380 mL PEEP:  [5 cmH20] 5 cmH20  No intake or output data in the 24 hours ending 09/23/21 0237 There were no vitals filed for this visit.  Examination: General: Adult female, resting in bed, in NAD. Neuro: Awake on vent, following commands. HEENT: [place]/AT. Sclerae anicteric. ETT in place. Cardiovascular: RRR, no M/R/G.  Lungs: Respirations even and unlabored.  CTA bilaterally, No W/R/R. Abdomen: BS x 4, soft, NT/ND.  Musculoskeletal: No gross deformities, no edema.  Skin: Intact, warm, no rashes.  Labs/imaging personally reviewed:  Echo 12/7 >    Assessment & Plan:   Inferolateral STEMI - s/p stenting of RCA per report (formal procedure note pending). - Per cards.  Respiratory insufficiency - due to inability to protect the airway in the setting of above.  Currently on PSV 5/5 and doing well. - Proceed with extubation.   Rest per primary team. Nothing further to add.  PCCM will sign off.  Please call us back if we can be of any  further assistance.  Best practice (evaluated daily):  Per primary team.  Labs   CBC: Recent Labs  Lab 09/17/21 0323  09/23/21 0041 09/23/21 0157  WBC  --   --  24.3*  HGB 10.5* 8.8*  8.5* 8.8*  HCT 32.5* 26.0*  25.0* 28.2*  MCV  --   --  88.7  PLT  --   --  413*    Basic Metabolic Panel: Recent Labs  Lab 09/17/21 0323 09/23/21 0041  NA 131* 131*  131*  K 4.2 4.2  4.2  CL 98 90*  CO2 26  --   GLUCOSE 208* 251*  BUN 17 11  CREATININE 0.87 1.30*  CALCIUM 8.5*  --    GFR: Estimated Creatinine Clearance: 36.8 mL/min (A) (by C-G formula based on SCr of 1.3 mg/dL (H)). Recent Labs  Lab 09/23/21 0157  WBC 24.3*    Liver Function Tests: No results for input(s): AST, ALT, ALKPHOS, BILITOT, PROT, ALBUMIN in the last 168 hours. No results for input(s): LIPASE, AMYLASE in the last 168 hours. No results for input(s): AMMONIA in the last 168 hours.  ABG    Component Value Date/Time   PHART 7.283 (L) 09/23/2021 0041   PCO2ART 61.8 (H) 09/23/2021 0041   PO2ART 46 (L) 09/23/2021 0041   HCO3 29.3 (H) 09/23/2021 0041   TCO2 31 09/23/2021 0041   TCO2 29 09/23/2021 0041   O2SAT 75.0 09/23/2021 0041     Coagulation Profile: No results for input(s): INR, PROTIME in the last 168 hours.  Cardiac Enzymes: No results for input(s): CKTOTAL, CKMB, CKMBINDEX, TROPONINI in the last 168 hours.  HbA1C: Hgb A1c MFr Bld  Date/Time Value Ref Range Status  09/17/2021 03:23 AM 6.7 (H) 4.8 - 5.6 % Final    Comment:    (NOTE)         Prediabetes: 5.7 - 6.4         Diabetes: >6.4         Glycemic control for adults with diabetes: <7.0   03/23/2020 05:27 AM 7.9 (H) 4.8 - 5.6 % Final    Comment:    (NOTE) Pre diabetes:          5.7%-6.4% Diabetes:              >6.4% Glycemic control for   <7.0% adults with diabetes     CBG: Recent Labs  Lab 09/16/21 1353 09/16/21 1943 09/16/21 2333 09/17/21 0734 09/23/21 0131  GLUCAP 122* 155* 271* 251* 263*    Review of Systems:   Unable to obtain as pt is on vent.  Past Medical History:  She,  has a past medical history of Asthma, Benign  essential HTN, COPD (chronic obstructive pulmonary disease) ([place]), IBS (irritable bowel syndrome), and Migraine headache.   Surgical History:   Past Surgical History:  Procedure Laterality Date   REVERSE SHOULDER ARTHROPLASTY Left 09/16/2021   Procedure: REVERSE SHOULDER ARTHROPLASTY;  Surgeon: Netta Cedars, MD;  Location: [facility]S;  Service: Orthopedics;  Laterality: Left;     Social History:   reports that she has been smoking cigarettes. She has been smoking an average of 1 pack per day. She has never used smokeless tobacco. She reports current alcohol use. She reports that she does not use drugs.   Family History:  Her family history includes CAD in her brother; Cancer - Other in her father; Hypertension in her mother.   Allergies Allergies  Allergen Reactions   Cefepime Anaphylaxis, Shortness Of Breath, Swelling  and Dermatitis   Edoxaban Other (See Comments)    Causes hematomas   Penicillins Anaphylaxis   Barium Iodide Rash   Other     Other reaction(s): Other Preservatives  (C-Diff) Other reaction(s): Unknown Preservatives  (C-Diff) Other reaction(s): OTHER   Acetaminophen     Will not take after Cyanide deaths, something changed when they mkt'd again.  Can take Childrens Liquid but sometimes upset stomach too   Albuterol Hypertension    ABLE TO TOLERATE IN INHALER FORM NOT NEBULIZER    Aspirin     Makes anemic   Beta Adrenergic Blockers     Paradoxical reactions   Diltiazem Other (See Comments)    Patient is intolerant to all CALCIUM CHANNEL BLOCKERS, causes her blood pressure to rise.    Lisinopril     Can only take her own mfr.     Oxycodone    Prednisone     REACTION: ibs probs   Propofol     REACTION: FEVER   Repatha [Evolocumab] Other (See Comments)    Knocked her out for 10 days   Statins     GI issues   Sulfamethoxazole-Trimethoprim     REACTION: antibiotic induced colitis   Lac Bovis Other (See Comments)     Home Medications  Prior to Admission  medications   Medication Sig Start Date End Date Taking? Authorizing Provider  albuterol (PROVENTIL HFA;VENTOLIN HFA) 108 (90 BASE) MCG/ACT inhaler Inhale 2 puffs into the lungs 3 (three) times daily as needed for wheezing or shortness of breath.    [provider]  amLODipine (NORVASC) 5 MG tablet Take 1 tablet (5 mg total) by mouth 2 (two) times daily. 03/31/20 09/16/21  Enzo Bi, MD  aspirin 81 MG chewable tablet Chew 81 mg by mouth 2 (two) times a week. No specific days 05/04/16   [provider]  methocarbamol (ROBAXIN) 500 MG tablet Take 1 tablet (500 mg total) by mouth every 8 (eight) hours as needed for muscle spasms. 09/16/21   Netta Cedars, MD  NOVOLOG FLEXPEN 100 UNIT/ML FlexPen Inject 0-2 Units into the skin with breakfast, with lunch, and with evening meal. Sliding scale 07/10/21   [provider]  oxyCODONE-acetaminophen (PERCOCET) 10-325 MG tablet Take 1 tablet by mouth every 4 (four) hours as needed for pain. 09/16/21   Netta Cedars, MD  promethazine (PHENERGAN) 25 MG suppository Place 25 mg rectally daily as needed for nausea. 09/14/21   [provider]  promethazine (PHENERGAN) 25 MG tablet Take 25 mg by mouth every 12 (twelve) hours as needed for nausea. 09/01/21   [provider]  promethazine (PHENERGAN) 25 MG tablet Take 1 tablet (25 mg total) by mouth every 6 (six) hours as needed for nausea or vomiting. 09/17/21   Netta Cedars, MD  STIOLTO RESPIMAT 2.5-2.5 MCG/ACT AERS Inhale 2 puffs into the lungs daily. 08/21/21   [provider]  Vitamin D, Ergocalciferol, (DRISDOL) 1.25 MG (50000 UNIT) CAPS capsule Take 50,000 Units by mouth once a week. 08/31/21   [provider]     Critical care time: 35 min.   Montey Hora, [facility] For pager details, please see AMION or use Epic chat  After 1900, please call [facility] for cross coverage needs 09/23/2021, 2:37 AM

## 2021-09-23 NOTE — Progress Notes (Signed)
Husband updated at bedside.  Still dyssynchronous despite heavy sedation. Adding versed gtt and starting PRN NMB for vent synchrony. Has severe autoPEEP. At this point it seems she likely will need at least one dose of steroids. Will add IV Mg+  as well.  Steffanie Dunn, DO 09/23/21 5:04 PM [facility]

## 2021-09-23 NOTE — Progress Notes (Signed)
ABG with PCO2 >100, pH 7.0  Vent adjusted-- shorter I-time, longer E-time, RR 26. Minimal auro-PEEP with PEEP increased to 10.  Steffanie Dunn, DO 09/23/21 11:38 AM [facility]

## 2021-09-24 ENCOUNTER — Inpatient Hospital Stay: Payer: Self-pay

## 2021-09-24 ENCOUNTER — Inpatient Hospital Stay (HOSPITAL_COMMUNITY): Payer: 59

## 2021-09-24 DIAGNOSIS — J441 Chronic obstructive pulmonary disease with (acute) exacerbation: Secondary | ICD-10-CM | POA: Diagnosis not present

## 2021-09-24 DIAGNOSIS — I2119 ST elevation (STEMI) myocardial infarction involving other coronary artery of inferior wall: Secondary | ICD-10-CM | POA: Diagnosis not present

## 2021-09-24 DIAGNOSIS — R609 Edema, unspecified: Secondary | ICD-10-CM

## 2021-09-24 DIAGNOSIS — Z9911 Dependence on respirator [ventilator] status: Secondary | ICD-10-CM | POA: Diagnosis not present

## 2021-09-24 LAB — CBC
HCT: 27.8 % — ABNORMAL LOW (ref 36.0–46.0)
Hemoglobin: 8.7 g/dL — ABNORMAL LOW (ref 12.0–15.0)
MCH: 28.3 pg (ref 26.0–34.0)
MCHC: 31.3 g/dL (ref 30.0–36.0)
MCV: 90.6 fL (ref 80.0–100.0)
Platelets: 331 10*3/uL (ref 150–400)
RBC: 3.07 MIL/uL — ABNORMAL LOW (ref 3.87–5.11)
RDW: 14.6 % (ref 11.5–15.5)
WBC: 23.3 10*3/uL — ABNORMAL HIGH (ref 4.0–10.5)
nRBC: 0 % (ref 0.0–0.2)

## 2021-09-24 LAB — LIPID PANEL
Cholesterol: 161 mg/dL (ref 0–200)
HDL: 27 mg/dL — ABNORMAL LOW (ref >40)
LDL Cholesterol: 110 mg/dL — ABNORMAL HIGH (ref 0–99)
Total CHOL/HDL Ratio: 6 RATIO
Triglycerides: 121 mg/dL (ref <150)
VLDL: 24 mg/dL (ref 0–40)

## 2021-09-24 LAB — GLUCOSE, CAPILLARY
Glucose-Capillary: 132 mg/dL — ABNORMAL HIGH (ref 70–99)
Glucose-Capillary: 178 mg/dL — ABNORMAL HIGH (ref 70–99)
Glucose-Capillary: 182 mg/dL — ABNORMAL HIGH (ref 70–99)
Glucose-Capillary: 227 mg/dL — ABNORMAL HIGH (ref 70–99)
Glucose-Capillary: 271 mg/dL — ABNORMAL HIGH (ref 70–99)
Glucose-Capillary: 275 mg/dL — ABNORMAL HIGH (ref 70–99)
Glucose-Capillary: 290 mg/dL — ABNORMAL HIGH (ref 70–99)

## 2021-09-24 LAB — COMPREHENSIVE METABOLIC PANEL
ALT: 37 U/L (ref 0–44)
AST: 53 U/L — ABNORMAL HIGH (ref 15–41)
Albumin: 2.7 g/dL — ABNORMAL LOW (ref 3.5–5.0)
Alkaline Phosphatase: 87 U/L (ref 38–126)
Anion gap: 11 (ref 5–15)
BUN: 20 mg/dL (ref 8–23)
CO2: 26 mmol/L (ref 22–32)
Calcium: 8.1 mg/dL — ABNORMAL LOW (ref 8.9–10.3)
Chloride: 95 mmol/L — ABNORMAL LOW (ref 98–111)
Creatinine, Ser: 1.29 mg/dL — ABNORMAL HIGH (ref 0.44–1.00)
GFR, Estimated: 46 mL/min — ABNORMAL LOW (ref >60)
Glucose, Bld: 248 mg/dL — ABNORMAL HIGH (ref 70–99)
Potassium: 4.1 mmol/L (ref 3.5–5.1)
Sodium: 132 mmol/L — ABNORMAL LOW (ref 135–145)
Total Bilirubin: 0.6 mg/dL (ref 0.3–1.2)
Total Protein: 6 g/dL — ABNORMAL LOW (ref 6.5–8.1)

## 2021-09-24 LAB — BASIC METABOLIC PANEL
Anion gap: 10 (ref 5–15)
BUN: 24 mg/dL — ABNORMAL HIGH (ref 8–23)
CO2: 30 mmol/L (ref 22–32)
Calcium: 7.9 mg/dL — ABNORMAL LOW (ref 8.9–10.3)
Chloride: 93 mmol/L — ABNORMAL LOW (ref 98–111)
Creatinine, Ser: 1.08 mg/dL — ABNORMAL HIGH (ref 0.44–1.00)
GFR, Estimated: 57 mL/min — ABNORMAL LOW (ref >60)
Glucose, Bld: 181 mg/dL — ABNORMAL HIGH (ref 70–99)
Potassium: 3.5 mmol/L (ref 3.5–5.1)
Sodium: 133 mmol/L — ABNORMAL LOW (ref 135–145)

## 2021-09-24 LAB — MAGNESIUM
Magnesium: 3 mg/dL — ABNORMAL HIGH (ref 1.7–2.4)
Magnesium: 2.4 mg/dL (ref 1.7–2.4)

## 2021-09-24 LAB — PHOSPHORUS: Phosphorus: 4 mg/dL (ref 2.5–4.6)

## 2021-09-24 LAB — TSH: TSH: 0.177 u[IU]/mL — ABNORMAL LOW (ref 0.350–4.500)

## 2021-09-24 LAB — T4, FREE: Free T4: 1.07 ng/dL (ref 0.61–1.12)

## 2021-09-24 LAB — BRAIN NATRIURETIC PEPTIDE: B Natriuretic Peptide: 1495.3 pg/mL — ABNORMAL HIGH (ref 0.0–100.0)

## 2021-09-24 MED ORDER — FUROSEMIDE 10 MG/ML IJ SOLN
40.0000 mg | Freq: Every day | INTRAMUSCULAR | Status: DC
  Administered 2021-09-24: 40 mg via INTRAVENOUS
  Filled 2021-09-24 (×2): qty 4

## 2021-09-24 MED ORDER — POTASSIUM CHLORIDE 20 MEQ PO PACK
40.0000 meq | PACK | Freq: Once | ORAL | Status: AC
  Administered 2021-09-24: 40 meq
  Filled 2021-09-24: qty 2

## 2021-09-24 MED ORDER — ROCURONIUM BROMIDE 10 MG/ML (PF) SYRINGE
0.6000 mg/kg | PREFILLED_SYRINGE | INTRAVENOUS | Status: DC | PRN
  Administered 2021-09-24 – 2021-09-25 (×4): 35.88 mg via INTRAVENOUS
  Filled 2021-09-24 (×2): qty 10

## 2021-09-24 MED ORDER — INSULIN ASPART 100 UNIT/ML IJ SOLN
3.0000 [IU] | INTRAMUSCULAR | Status: DC
  Administered 2021-09-24 – 2021-09-25 (×6): 3 [IU] via SUBCUTANEOUS

## 2021-09-24 MED ORDER — SODIUM CHLORIDE 0.9 % IV SOLN: INTRAVENOUS | Status: DC | PRN

## 2021-09-24 MED ORDER — SODIUM CHLORIDE 0.9 % IV SOLN
1.0000 g | Freq: Three times a day (TID) | INTRAVENOUS | Status: DC
  Administered 2021-09-24 – 2021-09-26 (×6): 1 g via INTRAVENOUS
  Filled 2021-09-24 (×9): qty 1

## 2021-09-24 NOTE — Progress Notes (Addendum)
NAME:  [name], MRN:  [id number], DOB:  07-13-1956, LOS: 1 ADMISSION DATE:  09/23/2021, CONSULTATION DATE:  09/23/21 REFERRING MD:  Jacinto Halim CHIEF COMPLAINT:  STEMI   History of Present Illness:  Pt is encephelopathic; therefore, this HPI is obtained from chart review. [name] is a 65 y.o. female who has a PMH as outlined below including CAD s/p CABG x 4, HTN, HLD, DM, COPD.  She presented to [facility] ED 12/7 after husband found at her at home unresponsive.  EMS was called and she was intubated in the field prior to transport to ED where she was found to have inferolateral STEMI.  She was subsequently transferred to [facility] for emergent PCI.  She was taken to cath lab and had stenting of the RCA (procedure note pending).  She then was transferred to the CVICU where PCCM asked to see in consultation. Upon our arrival, she is awake, following all commands on vent.  She has been weaning on PSV 5/5 and tolerating well.  Pertinent  Medical History:  has CLOSTRIDIUM DIFFICILE COLITIS; Hyperlipidemia; ANXIETY; ANXIETY DEPRESSION; TOBACCO ABUSE; Migraine; PERIPHERAL NEUROPATHY; ENDOTHELIAL CORNEAL DYSTROPHY; DISEASE, HYPERTENSIVE HEART NOS, W/O HF; PERIPHERAL VASCULAR DISEASE; DYSPLASTIC NEVUS, BACK; ASTHMA; GERD; DIVERTICULOSIS, COLON; IRRITABLE BOWEL SYNDROME; ACUTE CYSTITIS; MICROSCOPIC HEMATURIA; BREAST MASS; SEBACEOUS CYST; DEGENERATIVE DISC DISEASE, LUMBAR SPINE; BACK PAIN, LUMBAR; BACK PAIN, CHRONIC; WEIGHT LOSS; OTHER DYSPHAGIA; ABDOMINAL PAIN -GENERALIZED; GASTROINTESTINAL XRAY, ABNORMAL; CONTUSION, HIP, RIGHT; COPD exacerbation (HCC); Acute respiratory failure with hypoxia (HCC); 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HCC); Essential hypertension; Chronic pain syndrome; Overweight (BMI 25.0-29.9); Coronary artery disease involving native coronary artery of native heart without angina pectoris; Palpitations; Renal artery stenosis, non-flow-limiting (HCC); Hx of CABG; Asthma exacerbation; Anaphylactic  reaction; Hypertensive urgency; Paroxysmal tachycardia, unspecified (HCC); Acute diverticulitis; COPD with chronic bronchitis (HCC); Sepsis (HCC); Diverticulitis; H/O total shoulder replacement, left; Acute ST elevation myocardial infarction (STEMI) of inferior wall (HCC); and Acute ST elevation myocardial infarction (STEMI) involving other coronary artery of inferior wall (HCC) on their problem list.  Significant Hospital Events: Including procedures, antibiotic start and stop dates in addition to other pertinent events   12/7 admitted with inferolateral STEMI, taken for PCI of RCA, extubated, reintubated for respiratory failure  Interim History / Subjective:  Still breathing over the vent after nimbex.   Objective:  Blood pressure (!) 109/50, pulse (!) 101, temperature 97.6 F (36.4 C), temperature source Axillary, resp. rate 20, weight 59.8 kg, SpO2 99 %.    Vent Mode: PRVC FiO2 (%):  [50 %-100 %] 50 % Set Rate:  [12 bmp-30 bmp] 26 bmp Vt Set:  [380 mL] 380 mL PEEP:  [6 cmH20-10 cmH20] 8 cmH20 Pressure Support:  [6 cmH20] 6 cmH20 Plateau Pressure:  [14 cmH20-22 cmH20] 14 cmH20   Intake/Output Summary (Last 24 hours) at 09/24/2021 0841 Last data filed at 09/24/2021 0800 Gross per 24 hour  Intake 1879.65 ml  Output 710 ml  Net 1169.65 ml   Filed Weights   09/23/21 0710 09/24/21 0324  Weight: 61.3 kg 59.8 kg    Examination: General: critically ill appearing woman lying in bed intubated, sedated Neuro: RASS -5, moving legs spontaneously with suctioning, intact cough reflex, occasional breath stacking on the vent. HEENT: [place]/AT, eyes anicteric, ETT in place. Cardiovascular: S1S2, RRR Lungs: wheezing more but moving air better today, mild ETT secretions Abdomen: soft, NT, hypoactive bowel sounds Musculoskeletal: no cyanosis, minimal edema Skin: warm, dry, no rashes  Labs/imaging personally reviewed:  Echo 12/7 > 45-50%, G3DD, moderately elevated PASP,  moderately dilated LA,  moderately severe MR-- concern for papillary muscle dysfunction. Dilated IVC with reduced respiratory variability.  BNP 1495.3 TSH  0.177 7.27/69/124/31 Na+ 132 BUN 20 Cr 1.29 AST 53 WBC 23.3 H/H 27.8 Platelets 331  Assessment & Plan:   Inferolateral STEMI - s/p DES of RCA. Acute HFrEF Moderate to severe MR-- concern for papillary muscle dysfunction 2/2 ischemia -appreciate Cardiology's management -stopping Bblocker -ASA, plavix, statin -tele monitoring -optimize electrolytes  Shock-- likely distributive due to sedation -norepi to maintain MAP >65  Acute on chronic respiratory failure with hypoxia and hypercapnia requiring MV Acute COPD exacerbation  Acute pulmonary edema -LTVV, 4-8cc/kg IBW with goal Pplat<30 and DP<15 -PAD protocol-- RASS -4 to -5 for vent synchrony -VAP prevention protcol. -empiric azithromycin for COPD AE -diuresis -brovana and yupelri, budesonide nebs to hopefull reduce need for systemic steroids given recent MI -NMB PRN for vent synchrony until improved respiratory acidosis -using steroids sparingly due to concern for possible mechanical complications -CXR  Chronic anemia -monitor -transfuse for Hb<7 or hemodynamically significant bleeding  Leukocytosis, suspect reactive, but has been chronically elevated -monitor -CXR to evaluate for delayed presentation of pneumonia -needs close OP follow up for what seems like chronic leukocytosis  Hyponatremia, suspect hypervolemic -monitor -diuresis  Hyperglycemia -SSI PRN -goal BG 140-180 -adding TF coverage -holding additional steroids today  AKI -strict  I/Os -monitor -renally dose meds, avoid nephrotoxic meds  Low TSH -checking Free T3 & free T4   Best practice (evaluated daily):  Diet: TF Lines: Aline to be placed Foley: yes Code status: full Family updates: no family at bedside today; husband last updated 12/7  Labs   CBC: Recent Labs  Lab 09/23/21 0157 09/23/21 0613  09/23/21 1132 09/23/21 1304 09/23/21 2341 09/24/21 0221  WBC 24.3*  --   --   --   --  23.3*  HGB 8.8*  --  9.9* 10.5* 9.5* 8.7*  HCT 28.2*  --  29.0* 31.0* 28.0* 27.8*  MCV 88.7  --   --   --   --  90.6  PLT 413* 402*  --   --   --  331     Basic Metabolic Panel: Recent Labs  Lab 09/23/21 0041 09/23/21 0157 09/23/21 1132 09/23/21 1304 09/23/21 2341 09/24/21 0221  NA 131*  131* 130* 135 135 133* 132*  K 4.2  4.2 4.0 4.4 4.4 4.0 4.1  CL 90* 91*  --   --   --  95*  CO2  --  26  --   --   --  26  GLUCOSE 251* 284*  --   --   --  248*  BUN 11 11  --   --   --  20  CREATININE 1.30* 1.20*  --   --   --  1.29*  CALCIUM  --  7.9*  --   --   --  8.1*  MG  --   --   --   --   --  3.0*  PHOS  --   --   --   --   --  4.0    GFR: Estimated Creatinine Clearance: 36.6 mL/min (A) (by C-G formula based on SCr of 1.29 mg/dL (H)). Recent Labs  Lab 09/23/21 0157 09/24/21 0221  WBC 24.3* 23.3*     Liver Function Tests: Recent Labs  Lab 09/24/21 0221  AST 53*  ALT 37  ALKPHOS 87  BILITOT 0.6  PROT 6.0*  ALBUMIN 2.7*  No results for input(s): LIPASE, AMYLASE in the last 168 hours. No results for input(s): AMMONIA in the last 168 hours.  ABG    Component Value Date/Time   PHART 7.266 (L) 09/23/2021 2341   PCO2ART 68.5 (HH) 09/23/2021 2341   PO2ART 124 (H) 09/23/2021 2341   HCO3 31.0 (H) 09/23/2021 2341   TCO2 33 (H) 09/23/2021 2341   O2SAT 98.0 09/23/2021 2341      Coagulation Profile: No results for input(s): INR, PROTIME in the last 168 hours.  Cardiac Enzymes: No results for input(s): CKTOTAL, CKMB, CKMBINDEX, TROPONINI in the last 168 hours.  HbA1C: Hgb A1c MFr Bld  Date/Time Value Ref Range Status  09/17/2021 03:23 AM 6.7 (H) 4.8 - 5.6 % Final    Comment:    (NOTE)         Prediabetes: 5.7 - 6.4         Diabetes: >6.4         Glycemic control for adults with diabetes: <7.0   03/23/2020 05:27 AM 7.9 (H) 4.8 - 5.6 % Final    Comment:     (NOTE) Pre diabetes:          5.7%-6.4% Diabetes:              >6.4% Glycemic control for   <7.0% adults with diabetes      This patient is critically ill with multiple organ system failure which requires frequent high complexity decision making, assessment, support, evaluation, and titration of therapies. This was completed through the application of advanced monitoring technologies and extensive interpretation of multiple databases. During this encounter critical care time was devoted to patient care services described in this note for 39 minutes.  Julian Hy, DO 09/24/21 10:08 AM [facility]

## 2021-09-24 NOTE — Progress Notes (Deleted)
Inpatient Diabetes Program Recommendations  AACE/ADA: New Consensus Statement on Inpatient Glycemic Control (2015)  Target Ranges:  Prepandial:   less than 140 mg/dL      Peak postprandial:   less than 180 mg/dL (1-2 hours)      Critically ill patients:  140 - 180 mg/dL   Lab Results  Component Value Date   GLUCAP 271 (H) 09/24/2021   HGBA1C 6.7 (H) 09/17/2021    Review of Glycemic Control  Latest Reference Range & Units 09/23/21 13:46 09/23/21 17:23 09/23/21 19:51 09/24/21 00:10 09/24/21 03:52  Glucose-Capillary 70 - 99 mg/dL 301 (H) 601 (H) 093 (H) 178 (H) 271 (H)   Diabetes history: DM 2 Outpatient Diabetes medications:  Novolog 0-2 units  Current orders for Inpatient glycemic control:  Novolog moderate q 4 hours Vital 55 cc/hr  Inpatient Diabetes Program Recommendations:    May consider adding Novolog tube feed coverage 3 units q 4 hours.   Thanks,  Beryl Meager, RN, BC-ADM Inpatient Diabetes Coordinator Pager [phone]  (8a-5p)

## 2021-09-24 NOTE — Progress Notes (Signed)
Dinkels, MD placed orders for art line and PICC. Called IV team for PICC placement. No PICC nurse available. RT attempted art line placement twice, but was unsuccessful. Elink notified. Ground team unavailable to place art line. BP currently holding stable. Titrating down on levo. Will continue to monitor throughout shift.

## 2021-09-24 NOTE — Progress Notes (Signed)
Upper extremity venous has been completed.   Preliminary results in CV Proc.   [name] [name] 09/24/2021 9:30 AM

## 2021-09-24 NOTE — Progress Notes (Addendum)
Subjective:  Patient is intubated and sedated, no family present at bedside BNP 1495  Left arm swelling new since yesterday   Intake/Output from previous day:  I/O last 3 completed shifts: In: 2216.5 [I.V.:1728.2; NG/GT:317; IV Piggyback:171.3] Out: 1020 [Urine:1020] Total I/O In: 514.6 [I.V.:302.6; NG/GT:212] Out: 1185 [Urine:1185]  Blood pressure 116/61, pulse (!) 115, temperature 98 F (36.7 C), temperature source Axillary, resp. rate (!) 26, weight 59.8 kg, SpO2 91 %. Physical Exam Vitals reviewed.  Constitutional:      Comments: Intubated and sedated  HENT:     Head: Normocephalic and atraumatic.     Comments: ETT in place. Cardiovascular:     Rate and Rhythm: Normal rate and regular rhythm.     Pulses: Intact distal pulses.          Popliteal pulses are 0 on the right side and 0 on the left side.       Dorsalis pedis pulses are 0 on the right side and 0 on the left side.       Posterior tibial pulses are 0 on the right side and 0 on the left side.     Heart sounds: S1 normal and S2 normal. Heart sounds are distant. No murmur heard.   No gallop.  Pulmonary:     Effort: Pulmonary effort is normal.     Breath sounds: Wheezing (bilaterally) present.  Musculoskeletal:     Right lower leg: No edema.     Left lower leg: No edema.     Comments: Left arm edema  Skin:    General: Skin is warm and dry.     Capillary Refill: Capillary refill takes less than 2 seconds.  Neurological:     Mental Status: She is alert and oriented to person, place, and time.    Lab Results: BMP BNP (last 3 results) Recent Labs    09/23/21 0613 09/24/21 0221  BNP 1,323.2* 1,495.3*     ProBNP (last 3 results) No results for input(s): PROBNP in the last 8760 hours. BMP Latest Ref Rng & Units 09/24/2021 09/23/2021 09/23/2021  Glucose 70 - 99 mg/dL 643(G) - -  BUN 8 - 23 mg/dL 20 - -  Creatinine 1.91 - 1.00 mg/dL 3.88(K) - -  Sodium 220 - 145 mmol/L 132(L) 133(L) 135  Potassium 3.5 - 5.1  mmol/L 4.1 4.0 4.4  Chloride 98 - 111 mmol/L 95(L) - -  CO2 22 - 32 mmol/L 26 - -  Calcium 8.9 - 10.3 mg/dL 8.1(L) - -   Hepatic Function Latest Ref Rng & Units 09/24/2021 03/29/2020 03/22/2020  Total Protein 6.5 - 8.1 g/dL 6.0(L) 6.8 8.0  Albumin 3.5 - 5.0 g/dL 2.7(L) 3.7 4.3  AST 15 - 41 U/L 53(H) 27 40  ALT 0 - 44 U/L 37 21 18  Alk Phosphatase 38 - 126 U/L 87 98 121  Total Bilirubin 0.3 - 1.2 mg/dL 0.6 1.0 0.6  Bilirubin, Direct 0.0 - 0.3 mg/dL - - -   CBC Latest Ref Rng & Units 09/24/2021 09/23/2021 09/23/2021  WBC 4.0 - 10.5 K/uL 23.3(H) - -  Hemoglobin 12.0 - 15.0 g/dL 0.6(Q) 3.7(X) 10.5(L)  Hematocrit 36.0 - 46.0 % 27.8(L) 28.0(L) 31.0(L)  Platelets 150 - 400 K/uL 331 - -   Lipid Panel     Component Value Date/Time   CHOL 161 09/24/2021 0221   TRIG 121 09/24/2021 0221   HDL 27 (L) 09/24/2021 0221   CHOLHDL 6.0 09/24/2021 0221   VLDL 24 09/24/2021 0221  LDLCALC 110 (H) 09/24/2021 0221   LDLDIRECT 173.8 10/24/2009 1152   Cardiac Panel (last 3 results) No results for input(s): CKTOTAL, CKMB, TROPONINI, RELINDX in the last 72 hours.  HEMOGLOBIN A1C Lab Results  Component Value Date   HGBA1C 6.7 (H) 09/17/2021   MPG 146 09/17/2021   TSH Recent Labs    09/24/21 0221  TSH 0.177*    Imaging: Imaging results have been reviewed  Cardiac Studies: Echocardiogram 09/23/2021: 1. Left ventricular ejection fraction, by estimation, is 45 to 50%. Left  ventricular ejection fraction by PLAX is 54 %. The left ventricle has  normal function. The left ventricle demonstrates regional wall motion  abnormalities (see scoring  diagram/findings for description). Left ventricular diastolic parameters  are consistent with Grade III diastolic dysfunction (restrictive).  Elevated left ventricular end-diastolic pressure. There is mild  hypokinesis of the left ventricular, entire  inferoseptal wall, inferior wall and inferolateral wall.   2. Right ventricular systolic function is normal.  The right ventricular  size is normal. There is moderately elevated pulmonary artery systolic pressure. The estimated right ventricular systolic pressure is 17.0 mmHg.   3. Left atrial size was mildly dilated.   4. Moderate to at most moderately severe posteriorly directed MR.  Consider papillary muscle dysfunction. The mitral valve is normal in  structure. Moderate to severe mitral valve regurgitation. No evidence of  mitral stenosis.   5. The aortic valve is normal in structure. Aortic valve regurgitation is not visualized. No aortic stenosis is present.   6. The inferior vena cava is dilated in size with <50% respiratory  variability, suggesting right atrial pressure of 15 mmHg.   Left Heart Catheterization 09/23/21:  RCA diffuse in the proximal and mid RCA with ulceration and scattered 80-99% stenosis.  Successful PCI to ulcerated high grade native RCA with implantation of 3.0x48 mm Synergy SD in the proximal to mid RCA. Timi 3 to 3 flow. LM diffuse 80% stenosis.  LAD patent with mild disease and LIMA to LAD patent.  CX: Moderate OM1 widely patent. Cx severely diseased after origin of OM 1 with 90-95% stenosis.  Small diseased OM-2 RI: Small with ostial mild disease.  SVG to OM1-2 skip graft occluded, SVG to RCA occluded.  LV: EF 35-40% with inferior akinesis. No MR.  Hemodynamics: LV 112/18, EDP 25 mm Hg. Ao 114/80, mean 112 mm Hg. No pressure gradient across the AV.  90 mL contrast used  Echocardiogram 03/24/2020:     1. Left ventricular ejection fraction, by estimation, is 55 to 60%. The  left ventricle has normal function. The left ventricle has no regional  wall motion abnormalities. There is moderate left ventricular hypertrophy.  Left ventricular diastolic parameters are consistent with Grade I diastolic dysfunction (impaired relaxation).   2. Right ventricular systolic function is normal. The right ventricular  size is normal. Tricuspid regurgitation signal is inadequate for  assessing PA pressure.   3. The mitral valve is normal in structure. No evidence of mitral valve regurgitation. No evidence of mitral stenosis.   4. The aortic valve is normal in structure. Aortic valve regurgitation is not visualized. No aortic stenosis is present.   5. The inferior vena cava is normal in size with greater than 50%  respiratory variability, suggesting right atrial pressure of 3 mmHg.   CABG 04/29/2016: LIMA to LAD, SVG to RCA, SVG to OM1, SVG to OM2 for left main 90% stenosis 50% stenosis in circumflex and 60% stenosis in OM1, 50% stenosis in proximal RCA.  EKG 09/22/21 at 22:59 hours: Acute injury pattern in the inferior leads with reciprocal ST depression in lateral leads. Underlying Mobitz I AV Block.     03/29/2020: Sinus tachycardia at rate of 106 bpm, poor R wave progression, probably normal variant.  No evidence of ischemia.   Scheduled Meds:  arformoterol  15 mcg Nebulization BID   artificial tears  1 application Both Eyes T3S   aspirin  81 mg Per Tube Daily   atorvastatin  80 mg Per Tube Daily   budesonide (PULMICORT) nebulizer solution  0.25 mg Nebulization BID   chlorhexidine gluconate (MEDLINE KIT)  15 mL Mouth Rinse BID   Chlorhexidine Gluconate Cloth  6 each Topical Daily   docusate  100 mg Per Tube BID   fentaNYL (SUBLIMAZE) injection  50 mcg Intravenous Once   furosemide  40 mg Intravenous Daily   heparin  5,000 Units Subcutaneous Q8H   insulin aspart  0-15 Units Subcutaneous Q4H   insulin aspart  3 Units Subcutaneous Q4H   ipratropium-albuterol  3 mL Nebulization Q4H   mouth rinse  15 mL Mouth Rinse 10 times per day   pantoprazole (PROTONIX) IV  40 mg Intravenous Daily   polyethylene glycol  17 g Per Tube Daily   revefenacin  175 mcg Nebulization Daily   sodium chloride flush  3 mL Intravenous Q12H   ticagrelor  90 mg Per Tube BID   Continuous Infusions:  sodium chloride     sodium chloride Stopped (09/23/21 1753)   azithromycin Stopped  (09/23/21 1914)   aztreonam 1 g (09/24/21 1319)   feeding supplement (VITAL AF 1.2 CAL) 40 mL/hr at 09/24/21 1039   fentaNYL infusion INTRAVENOUS 150 mcg/hr (09/24/21 1300)   midazolam 5 mg/hr (09/24/21 1300)   norepinephrine (LEVOPHED) Adult infusion 6 mcg/min (09/24/21 1300)   PRN Meds:.sodium chloride, acetaminophen, fentaNYL, midazolam, ondansetron (ZOFRAN) IV, oxyCODONE-acetaminophen **AND** oxyCODONE, promethazine, rocuronium bromide, sodium chloride flush  Assessment/Plan:  Inferior lateral STEMI status post RCA stenting Recommend uninterrupted dual antiplatelet therapy with Aspirin $RemoveBefo'81mg'bTIjWesriXi$  daily and Ticagrelor $RemoveBefore'90mg'[id number]$  twice daily for a minimum of 12 months  Continue statin therapy, atorvastatin 80 mg daily Stop beta blocker due to hypotension  No ARB or ACE inhibitor at this time given hypotension and creatinine is mildly elevated from baseline at 1.2. Echocardiogram revealed LVEF 40-45% with grade 3 diastolic dysfunction and moderately severe MR likely secondary to ischemia  BNP elevated, recommend diuresis. Start Lasix 40 mg IV daily Strict I&O's Monitor renal function closely  Respiratory failure: Multifactorial including acute COPD exacerbation, acute STEMI, with pulmonary edema. Start Lasix 40 mg IV daily Appreciate PCCM recommendations.  Mitral regurgitation: Likely ischemic Continue management as above  Diabetes mellitus Continue glycemic control protocol  Hyperlipidemia Continue statin therapy  Hyponatremia:  Likely secondary to volume overload, dilutional Continue to monitor with diuresis.  AKI: Recommend diuresis with close monitoring of renal function Strict I&O's  COPD Contributing to patient's hypoxia.  Appreciate PCCM recommendations   Given new left arm edema will obtain upper extremity Doppler.  Patient was seen in collaboration with Dr. Einar Gip. He also reviewed patient's chart and examined the patient. Dr. Einar Gip is in agreement of the plan.    Spent 30 minutes of critical care time in evaluation of patient's complex medical issues including but not limited to heart failure, coronary artery disease and myocardial infarction, management of COPD and hypoxemic respiratory failure.   Alethia Berthold, PA-C 09/24/2021, 1:31 PM Office: [phone]

## 2021-09-24 NOTE — Progress Notes (Signed)
Inpatient Diabetes Program Recommendations  AACE/ADA: New Consensus Statement on Inpatient Glycemic Control (2015)  Target Ranges:  Prepandial:   less than 140 mg/dL      Peak postprandial:   less than 180 mg/dL (1-2 hours)      Critically ill patients:  140 - 180 mg/dL   Lab Results  Component Value Date   GLUCAP 271 (H) 09/24/2021   HGBA1C 6.7 (H) 09/17/2021    Review of Glycemic Control  Latest Reference Range & Units 09/23/21 13:46 09/23/21 17:23 09/23/21 19:51 09/24/21 00:10 09/24/21 03:52  Glucose-Capillary 70 - 99 mg/dL 678 (H) 938 (H) 101 (H) 178 (H) 271 (H)   Diabetes history:  DM 2 Outpatient Diabetes medications:  Novolog 0-2 units tid with meals Current orders for Inpatient glycemic control:  Novolog 0-15 units q 4 hours  Inpatient Diabetes Program Recommendations:    Blood sugars>goal.  Consider adding Novolog tube feed coverage 3 units q 4 hours.    Thanks,  Beryl Meager, RN, BC-ADM Inpatient Diabetes Coordinator Pager [phone]  (8a-5p)

## 2021-09-24 NOTE — Progress Notes (Signed)
An USGPIV (ultrasound guided PIV) has been placed for short-term vasopressor infusion. Should this treatment be needed beyond 48hours, best practice supports seeking central line access.  It will be the responsibility of the bedside nurse to follow best practice to prevent extravasations including placing the IVWatch when indicated.   

## 2021-09-24 NOTE — Progress Notes (Signed)
eLink Physician-Brief Progress Note Patient Name: [name] DOB: Feb 18, 1956 MRN: [id number]   Date of Service  09/24/2021  HPI/Events of Note  ABG on 70%/PRVC 26/TV 380/P 8 = 7.266/68.5/124/31.0. My sign over from the rounding team indicates that pH > 7.2 is acceptable given severe air trapping at higher ventilator rates.   eICU Interventions  Continue present ventilator management.      Intervention Category Major Interventions: Respiratory failure - evaluation and management;Acid-Base disturbance - evaluation and management  Andrus Sharp Eugene 09/24/2021, 12:13 AM

## 2021-09-24 NOTE — Progress Notes (Addendum)
Husband updated at bedside.  We discussed her smoking-- she has been very resistant to considering cessation int he past, but he is hoping that this event changes that. He does not think she has tried prescription meds for smoking cessation in the past.   Steffanie Dunn, DO 09/24/21 11:33 AM [facility]

## 2021-09-24 NOTE — Progress Notes (Signed)
eLink Physician-Brief Progress Note Patient Name: [name] DOB: May 14, 1956 MRN: [id number]   Date of Service  09/24/2021  HPI/Events of Note  Pt on max dose periph levo  eICU Interventions  Ordered PICC and ordered an art line as pt is getting frequent abg's and is on levo     Intervention Category Intermediate Interventions: Hypotension - evaluation and management  Jacinta Shoe 09/24/2021, 7:52 PM

## 2021-09-24 NOTE — Progress Notes (Signed)
Pharmacy Antibiotic Note  [name] is a 65 y.o. female admitted on 09/23/2021 with pneumonia.  Pharmacy has been consulted for aztreonam dosing.  Had tolerated ceftriaxone in the past but had notable allergic reaction to cefepime. WBC 23, afebrile. Scr 1.29 (CrCl 36 mL/min). Started on azithromycin.   Plan: Aztreonam 1g IV every 8 hours Monitor renal fx, cx results, clinical pic  Weight: 59.8 kg (131 lb 13.4 oz)  Temp (24hrs), Avg:98.9 F (37.2 C), Min:97.6 F (36.4 C), Max:100.1 F (37.8 C)  Recent Labs  Lab 09/23/21 0041 09/23/21 0157 09/24/21 0221  WBC  --  24.3* 23.3*  CREATININE 1.30* 1.20* 1.29*    Estimated Creatinine Clearance: 36.6 mL/min (A) (by C-G formula based on SCr of 1.29 mg/dL (H)).    Allergies  Allergen Reactions   Cefepime Anaphylaxis, Shortness Of Breath, Swelling and Dermatitis   Edoxaban Other (See Comments)    Causes hematomas   Penicillins Anaphylaxis   Barium Iodide Rash   Other     Other reaction(s): Other Preservatives  (C-Diff) Other reaction(s): Unknown Preservatives  (C-Diff) Other reaction(s): OTHER   Acetaminophen     Will not take after Cyanide deaths, something changed when they mkt'd again.  Can take Childrens Liquid but sometimes upset stomach too   Albuterol Hypertension    ABLE TO TOLERATE IN INHALER FORM NOT NEBULIZER    Aspirin     Makes anemic   Beta Adrenergic Blockers     Paradoxical reactions   Diltiazem Other (See Comments)    Patient is intolerant to all CALCIUM CHANNEL BLOCKERS, causes her blood pressure to rise.    Lisinopril     Can only take her own mfr.     Oxycodone    Prednisone     REACTION: ibs probs   Propofol     REACTION: FEVER   Repatha [Evolocumab] Other (See Comments)    Knocked her out for 10 days   Statins     GI issues   Sulfamethoxazole-Trimethoprim     REACTION: antibiotic induced colitis   Lac Bovis Other (See Comments)    Antimicrobials this admission: Vanc 12/7 x1 Azithro  12/7 >> Aztreonam 12/8>>   Dose adjustments this admission: N/A  Microbiology results: 12/8 Sputum: sent  12/7 MRSA PCR: neg   Thank you for allowing pharmacy to be a part of this patient's care.  Sherron Monday, PharmD, BCCCP Clinical Pharmacist  Phone: [phone] 09/24/2021 10:44 AM  Please check AMION for all [facility] phone numbers After 10:00 PM, call Main Pharmacy [phone]

## 2021-09-24 NOTE — Progress Notes (Signed)
2 RT's attempts X2 in each arm radial. Art line placement unsuccessful, will notify CCM.

## 2021-09-25 ENCOUNTER — Inpatient Hospital Stay (HOSPITAL_COMMUNITY): Payer: 59

## 2021-09-25 DIAGNOSIS — J9621 Acute and chronic respiratory failure with hypoxia: Secondary | ICD-10-CM | POA: Diagnosis not present

## 2021-09-25 DIAGNOSIS — I2119 ST elevation (STEMI) myocardial infarction involving other coronary artery of inferior wall: Secondary | ICD-10-CM | POA: Diagnosis not present

## 2021-09-25 DIAGNOSIS — J9601 Acute respiratory failure with hypoxia: Secondary | ICD-10-CM | POA: Diagnosis not present

## 2021-09-25 DIAGNOSIS — G9341 Metabolic encephalopathy: Secondary | ICD-10-CM | POA: Diagnosis not present

## 2021-09-25 LAB — CBC
HCT: 27.7 % — ABNORMAL LOW (ref 36.0–46.0)
Hemoglobin: 8.5 g/dL — ABNORMAL LOW (ref 12.0–15.0)
MCH: 27.7 pg (ref 26.0–34.0)
MCHC: 30.7 g/dL (ref 30.0–36.0)
MCV: 90.2 fL (ref 80.0–100.0)
Platelets: 456 10*3/uL — ABNORMAL HIGH (ref 150–400)
RBC: 3.07 MIL/uL — ABNORMAL LOW (ref 3.87–5.11)
RDW: 14.6 % (ref 11.5–15.5)
WBC: 30.7 10*3/uL — ABNORMAL HIGH (ref 4.0–10.5)
nRBC: 0 % (ref 0.0–0.2)

## 2021-09-25 LAB — BASIC METABOLIC PANEL
Anion gap: 11 (ref 5–15)
BUN: 23 mg/dL (ref 8–23)
CO2: 30 mmol/L (ref 22–32)
Calcium: 7.9 mg/dL — ABNORMAL LOW (ref 8.9–10.3)
Chloride: 93 mmol/L — ABNORMAL LOW (ref 98–111)
Creatinine, Ser: 0.96 mg/dL (ref 0.44–1.00)
GFR, Estimated: >60 mL/min (ref >60)
Glucose, Bld: 156 mg/dL — ABNORMAL HIGH (ref 70–99)
Potassium: 4 mmol/L (ref 3.5–5.1)
Sodium: 134 mmol/L — ABNORMAL LOW (ref 135–145)

## 2021-09-25 LAB — GLUCOSE, CAPILLARY
Glucose-Capillary: 137 mg/dL — ABNORMAL HIGH (ref 70–99)
Glucose-Capillary: 203 mg/dL — ABNORMAL HIGH (ref 70–99)
Glucose-Capillary: 205 mg/dL — ABNORMAL HIGH (ref 70–99)
Glucose-Capillary: 205 mg/dL — ABNORMAL HIGH (ref 70–99)
Glucose-Capillary: 240 mg/dL — ABNORMAL HIGH (ref 70–99)
Glucose-Capillary: 315 mg/dL — ABNORMAL HIGH (ref 70–99)
Glucose-Capillary: 339 mg/dL — ABNORMAL HIGH (ref 70–99)

## 2021-09-25 LAB — BLOOD GAS, VENOUS
Acid-Base Excess: 10 mmol/L — ABNORMAL HIGH (ref 0.0–2.0)
Bicarbonate: 36.3 mmol/L — ABNORMAL HIGH (ref 20.0–28.0)
FIO2: 50
O2 Saturation: 73.7 %
Patient temperature: 37
pCO2, Ven: 73.2 mmHg (ref 44.0–60.0)
pH, Ven: 7.316 (ref 7.250–7.430)
pO2, Ven: 41.9 mmHg (ref 32.0–45.0)

## 2021-09-25 LAB — PHOSPHORUS: Phosphorus: 2.4 mg/dL — ABNORMAL LOW (ref 2.5–4.6)

## 2021-09-25 LAB — MAGNESIUM: Magnesium: 2.2 mg/dL (ref 1.7–2.4)

## 2021-09-25 LAB — T3, FREE: T3, Free: 1.5 pg/mL — ABNORMAL LOW (ref 2.0–4.4)

## 2021-09-25 LAB — BRAIN NATRIURETIC PEPTIDE: B Natriuretic Peptide: 1291.5 pg/mL — ABNORMAL HIGH (ref 0.0–100.0)

## 2021-09-25 MED ORDER — METHYLPREDNISOLONE SODIUM SUCC 40 MG IJ SOLR
40.0000 mg | INTRAMUSCULAR | Status: AC
  Administered 2021-09-25 – 2021-09-29 (×5): 40 mg via INTRAVENOUS
  Filled 2021-09-25 (×5): qty 1

## 2021-09-25 MED ORDER — SODIUM CHLORIDE 0.9% FLUSH
10.0000 mL | Freq: Two times a day (BID) | INTRAVENOUS | Status: DC
  Administered 2021-09-25 – 2021-09-28 (×8): 10 mL
  Administered 2021-09-29 – 2021-09-30 (×4): 20 mL
  Administered 2021-10-01 – 2021-10-02 (×3): 10 mL

## 2021-09-25 MED ORDER — POTASSIUM CHLORIDE 20 MEQ PO PACK
60.0000 meq | PACK | Freq: Once | ORAL | Status: AC
  Administered 2021-09-25: 60 meq
  Filled 2021-09-25: qty 3

## 2021-09-25 MED ORDER — MAGNESIUM SULFATE 2 GM/50ML IV SOLN
2.0000 g | Freq: Once | INTRAVENOUS | Status: AC
  Administered 2021-09-25: 2 g via INTRAVENOUS
  Filled 2021-09-25: qty 50

## 2021-09-25 MED ORDER — POTASSIUM & SODIUM PHOSPHATES 280-160-250 MG PO PACK
2.0000 | PACK | Freq: Once | ORAL | Status: AC
  Administered 2021-09-25: 2
  Filled 2021-09-25: qty 2

## 2021-09-25 MED ORDER — FUROSEMIDE 10 MG/ML IJ SOLN: 40.0000 mg | Freq: Three times a day (TID) | INTRAMUSCULAR | Status: DC

## 2021-09-25 MED ORDER — INSULIN ASPART 100 UNIT/ML IJ SOLN
5.0000 [IU] | INTRAMUSCULAR | Status: DC
  Administered 2021-09-25 – 2021-09-29 (×20): 5 [IU] via SUBCUTANEOUS

## 2021-09-25 MED ORDER — FUROSEMIDE 10 MG/ML IJ SOLN
40.0000 mg | Freq: Four times a day (QID) | INTRAMUSCULAR | Status: AC
  Administered 2021-09-25 (×3): 40 mg via INTRAVENOUS
  Filled 2021-09-25 (×2): qty 4

## 2021-09-25 MED ORDER — SODIUM CHLORIDE 0.9% FLUSH: 10.0000 mL | INTRAVENOUS | Status: DC | PRN

## 2021-09-25 NOTE — Progress Notes (Signed)
Husband updated at bedside.  Steffanie Dunn, DO 09/25/21 1:56 PM [facility]

## 2021-09-25 NOTE — Progress Notes (Signed)
Peripherally Inserted Central Catheter Placement  The IV Nurse has discussed with the patient and/or persons authorized to consent for the patient, the purpose of this procedure and the potential benefits and risks involved with this procedure.  The benefits include less needle sticks, lab draws from the catheter, and the patient may be discharged home with the catheter. Risks include, but not limited to, infection, bleeding, blood clot (thrombus formation), and puncture of an artery; nerve damage and irregular heartbeat and possibility to perform a PICC exchange if needed/ordered by physician.  Alternatives to this procedure were also discussed.  Bard Power PICC patient education guide, fact sheet on infection prevention and patient information card has been provided to patient /or left at bedside.    PICC Placement Documentation  PICC Triple Lumen 09/25/21 PICC Right Basilic 34 cm 0 cm (Active)  Indication for Insertion or Continuance of Line Vasoactive infusions 09/25/21 1015  Exposed Catheter (cm) 0 cm 09/25/21 1015  Site Assessment Clean;Dry;Intact 09/25/21 1015  Lumen #1 Status Flushed;Saline locked;Blood return noted 09/25/21 1015  Lumen #2 Status Flushed;Saline locked;Blood return noted 09/25/21 1015  Lumen #3 Status Flushed;Saline locked;Blood return noted 09/25/21 1015  Dressing Type Transparent 09/25/21 1015  Dressing Status Clean;Dry;Intact 09/25/21 1015  Antimicrobial disc in place? Yes 09/25/21 1015  Dressing Intervention New dressing;Other (Comment) 09/25/21 1015  Dressing Change Due 10/02/21 09/25/21 1015    Telephone consent signed by husband   Reginia Forts Albarece 09/25/2021, 10:18 AM

## 2021-09-25 NOTE — Progress Notes (Signed)
Subjective:  Patient is intubated and sedated, no family present at bedside BNP 1291 Presently on levophed   Intake/Output from previous day:  I/O last 3 completed shifts: In: 3278.9 [I.V.:1420.4; NG/GT:1399; IV Piggyback:459.5] Out: 2710 [Urine:2710] Total I/O In: 166 [I.V.:36; NG/GT:130] Out: 45 [Urine:45]  Blood pressure (!) 99/39, pulse 99, temperature 99 F (37.2 C), temperature source Axillary, resp. rate 20, weight 58.8 kg, SpO2 92 %. Physical Exam Vitals reviewed.  Constitutional:      Comments: Intubated and sedated  HENT:     Head: Normocephalic and atraumatic.     Comments: ETT in place. Cardiovascular:     Rate and Rhythm: Normal rate and regular rhythm.     Pulses: Intact distal pulses.          Popliteal pulses are 0 on the right side and 0 on the left side.       Dorsalis pedis pulses are 0 on the right side and 0 on the left side.       Posterior tibial pulses are 0 on the right side and 0 on the left side.     Heart sounds: S1 normal and S2 normal. Heart sounds are distant. Murmur heard.  Systolic murmur is present. Faint blowing holosystolic murmur is present at the apex    No gallop.  Pulmonary:     Effort: Pulmonary effort is normal. No respiratory distress.     Breath sounds: Wheezing (bilaterally) present. No rhonchi or rales.  Musculoskeletal:     Right lower leg: No edema.     Left lower leg: No edema.     Comments: Left arm edema  Skin:    General: Skin is warm and dry.     Capillary Refill: Capillary refill takes less than 2 seconds.  Neurological:     Mental Status: She is alert and oriented to person, place, and time.    Lab Results: BMP BNP (last 3 results) Recent Labs    09/23/21 0613 09/24/21 0221 09/25/21 0241  BNP 1,323.2* 1,495.3* 1,291.5*     ProBNP (last 3 results) No results for input(s): PROBNP in the last 8760 hours. BMP Latest Ref Rng & Units 09/25/2021 09/24/2021 09/24/2021  Glucose 70 - 99 mg/dL 156(H) 181(H) 248(H)   BUN 8 - 23 mg/dL 23 24(H) 20  Creatinine 0.44 - 1.00 mg/dL 0.96 1.08(H) 1.29(H)  Sodium 135 - 145 mmol/L 134(L) 133(L) 132(L)  Potassium 3.5 - 5.1 mmol/L 4.0 3.5 4.1  Chloride 98 - 111 mmol/L 93(L) 93(L) 95(L)  CO2 22 - 32 mmol/L _0 Calcium 8.9 - 10.3 mg/dL 7.9(L) 7.9(L) 8.1(L)   Hepatic Function Latest Ref Rng & Units 09/24/2021 03/29/2020 03/22/2020  Total Protein 6.5 - 8.1 g/dL 6.0(L) 6.8 8.0  Albumin 3.5 - 5.0 g/dL 2.7(L) 3.7 4.3  AST 15 - 41 U/L 53(H) 27 40  ALT 0 - 44 U/L 37 21 18  Alk Phosphatase 38 - 126 U/L 87 98 121  Total Bilirubin 0.3 - 1.2 mg/dL 0.6 1.0 0.6  Bilirubin, Direct 0.0 - 0.3 mg/dL - - -   CBC Latest Ref Rng & Units 09/25/2021 09/24/2021 09/23/2021  WBC 4.0 - 10.5 K/uL 30.7(H) 23.3(H) -  Hemoglobin 12.0 - 15.0 g/dL 8.5(L) 8.7(L) 9.5(L)  Hematocrit 36.0 - 46.0 % 27.7(L) 27.8(L) 28.0(L)  Platelets 150 - 400 K/uL 456(H) 331 -   Lipid Panel     Component Value Date/Time   CHOL 161 09/24/2021 0221   TRIG 121 09/24/2021 0221  HDL 27 (L) 09/24/2021 0221   CHOLHDL 6.0 09/24/2021 0221   VLDL 24 09/24/2021 0221   LDLCALC 110 (H) 09/24/2021 0221   LDLDIRECT 173.8 10/24/2009 1152   Cardiac Panel (last 3 results) No results for input(s): CKTOTAL, CKMB, TROPONINI, RELINDX in the last 72 hours.  HEMOGLOBIN A1C Lab Results  Component Value Date   HGBA1C 6.7 (H) 09/17/2021   MPG 146 09/17/2021   TSH Recent Labs    09/24/21 0221  TSH 0.177*     Imaging: Imaging results have been reviewed  Cardiac Studies: Echocardiogram 09/23/2021: 1. Left ventricular ejection fraction, by estimation, is 45 to 50%. Left  ventricular ejection fraction by PLAX is 54 %. The left ventricle has  normal function. The left ventricle demonstrates regional wall motion  abnormalities (see scoring  diagram/findings for description). Left ventricular diastolic parameters  are consistent with Grade III diastolic dysfunction (restrictive).  Elevated left ventricular  end-diastolic pressure. There is mild  hypokinesis of the left ventricular, entire  inferoseptal wall, inferior wall and inferolateral wall.   2. Right ventricular systolic function is normal. The right ventricular  size is normal. There is moderately elevated pulmonary artery systolic pressure. The estimated right ventricular systolic pressure is 61.4 mmHg.   3. Left atrial size was mildly dilated.   4. Moderate to at most moderately severe posteriorly directed MR.  Consider papillary muscle dysfunction. The mitral valve is normal in  structure. Moderate to severe mitral valve regurgitation. No evidence of  mitral stenosis.   5. The aortic valve is normal in structure. Aortic valve regurgitation is not visualized. No aortic stenosis is present.   6. The inferior vena cava is dilated in size with <50% respiratory  variability, suggesting right atrial pressure of 15 mmHg.   Left Heart Catheterization 09/23/21:  RCA diffuse in the proximal and mid RCA with ulceration and scattered 80-99% stenosis.  Successful PCI to ulcerated high grade native RCA with implantation of 3.0x48 mm Synergy SD in the proximal to mid RCA. Timi 3 to 3 flow. LM diffuse 80% stenosis.  LAD patent with mild disease and LIMA to LAD patent.  CX: Moderate OM1 widely patent. Cx severely diseased after origin of OM 1 with 90-95% stenosis.  Small diseased OM-2 RI: Small with ostial mild disease.  SVG to OM1-2 skip graft occluded, SVG to RCA occluded.  LV: EF 35-40% with inferior akinesis. No MR.  Hemodynamics: LV 112/18, EDP 25 mm Hg. Ao 114/80, mean 112 mm Hg. No pressure gradient across the AV.  90 mL contrast used  Echocardiogram 03/24/2020:     1. Left ventricular ejection fraction, by estimation, is 55 to 60%. The  left ventricle has normal function. The left ventricle has no regional  wall motion abnormalities. There is moderate left ventricular hypertrophy.  Left ventricular diastolic parameters are consistent with  Grade I diastolic dysfunction (impaired relaxation).   2. Right ventricular systolic function is normal. The right ventricular  size is normal. Tricuspid regurgitation signal is inadequate for assessing PA pressure.   3. The mitral valve is normal in structure. No evidence of mitral valve regurgitation. No evidence of mitral stenosis.   4. The aortic valve is normal in structure. Aortic valve regurgitation is not visualized. No aortic stenosis is present.   5. The inferior vena cava is normal in size with greater than 50%  respiratory variability, suggesting right atrial pressure of 3 mmHg.   CABG 04/29/2016: LIMA to LAD, SVG to RCA, SVG to OM1, SVG to  OM2 for left main 90% stenosis 50% stenosis in circumflex and 60% stenosis in OM1, 50% stenosis in proximal RCA.  EKG 09/22/21 at 22:59 hours: Acute injury pattern in the inferior leads with reciprocal ST depression in lateral leads. Underlying Mobitz I AV Block.     03/29/2020: Sinus tachycardia at rate of 106 bpm, poor R wave progression, probably normal variant.  No evidence of ischemia.   Scheduled Meds:  arformoterol  15 mcg Nebulization BID   artificial tears  1 application Both Eyes L8X   aspirin  81 mg Per Tube Daily   atorvastatin  80 mg Per Tube Daily   budesonide (PULMICORT) nebulizer solution  0.25 mg Nebulization BID   chlorhexidine gluconate (MEDLINE KIT)  15 mL Mouth Rinse BID   Chlorhexidine Gluconate Cloth  6 each Topical Daily   docusate  100 mg Per Tube BID   fentaNYL (SUBLIMAZE) injection  50 mcg Intravenous Once   furosemide  40 mg Intravenous Q6H   heparin  5,000 Units Subcutaneous Q8H   insulin aspart  0-15 Units Subcutaneous Q4H   insulin aspart  5 Units Subcutaneous Q4H   ipratropium-albuterol  3 mL Nebulization Q4H   mouth rinse  15 mL Mouth Rinse 10 times per day   methylPREDNISolone (SOLU-MEDROL) injection  40 mg Intravenous Q24H   pantoprazole (PROTONIX) IV  40 mg Intravenous Daily   polyethylene glycol  17  g Per Tube Daily   potassium chloride  60 mEq Per Tube Once   revefenacin  175 mcg Nebulization Daily   sodium chloride flush  3 mL Intravenous Q12H   ticagrelor  90 mg Per Tube BID   Continuous Infusions:  sodium chloride     sodium chloride Stopped (09/23/21 1753)   sodium chloride     azithromycin Stopped (09/24/21 1853)   aztreonam Stopped (09/25/21 0347)   feeding supplement (VITAL AF 1.2 CAL) 55 mL/hr at 09/25/21 0800   fentaNYL infusion INTRAVENOUS 150 mcg/hr (09/25/21 0836)   magnesium sulfate bolus IVPB     midazolam 6 mg/hr (09/25/21 0800)   norepinephrine (LEVOPHED) Adult infusion 4 mcg/min (09/25/21 0836)   PRN Meds:.sodium chloride, Place/Maintain arterial line **AND** sodium chloride, acetaminophen, fentaNYL, midazolam, ondansetron (ZOFRAN) IV, oxyCODONE-acetaminophen **AND** oxyCODONE, promethazine, rocuronium bromide, sodium chloride flush  Assessment/Plan:  Inferior lateral STEMI status post RCA stenting Recommend uninterrupted dual antiplatelet therapy with Aspirin 19m daily and Ticagrelor 979mtwice daily for a minimum of 12 months  Continue statin therapy, atorvastatin 80 mg daily Continue to hold beta blocker and ARB/ACE Echocardiogram revealed LVEF 40-45% with grade 3 diastolic dysfunction and moderately severe MR likely secondary to ischemia  Continue diuresis  Patient is presently on levophed, could consider beta blocker therapy when off levo.  Strict I&O's Monitor renal function closely  Respiratory failure/COPD exacerbation: Predominately COPD exacerbation  Will defer primary management to PCCM at this time as respiratory failure is primarily due to COPD and PCCM is also managing volume status.  Will obtain chest x-ray and continue to monitor BNP  Mitral regurgitation: Moderate to moderately severe mitral regurgitation is due to papillary muscle dysfunction in view of recent right coronary artery involvement.   Do not suspect mechanical complications.    Hopeful this will improve  Diabetes mellitus Continue glycemic control protocol  Hyperlipidemia Continue statin therapy  Ultrasound negative for DVT in left arm.   From cardiology standpoint there is not much to contribute at this time given patient's respiratory status and hypotension.  Will defer management to  PCCM at this time.  Recommend continued diuresis.  Initiation of guideline directed medical therapy for CAD and heart failure is limited due to hypotension at this time.  We will continue to follow along and consider initiation of guideline directed therapy when hemodynamics and renal function allow.  Patient was seen in collaboration with Dr. Einar Gip. He also reviewed patient's chart and examined the patient. Dr. Einar Gip is in agreement of the plan.   Spent 35 minutes of critical care time in evaluation of patient's complex medical issues including but not limited to heart failure, coronary artery disease and myocardial infarction, management of COPD and hypoxemic respiratory failure. Discussed patient with PCCM provider team.    Alethia Berthold, PA-C 09/25/2021, 9:07 AM Office: [phone]

## 2021-09-25 NOTE — Progress Notes (Addendum)
NAME:  [name], MRN:  [id number], DOB:  30-Nov-1955, LOS: 2 ADMISSION DATE:  09/23/2021, CONSULTATION DATE:  09/23/21 REFERRING MD:  Einar Gip CHIEF COMPLAINT:  STEMI   History of Present Illness:  Pt is encephelopathic; therefore, this HPI is obtained from chart review. [name] is a 65 y.o. female who has a PMH as outlined below including CAD s/p CABG x 4, HTN, HLD, DM, COPD.  She presented to [facility] ED 12/7 after husband found at her at home unresponsive.  EMS was called and she was intubated in the field prior to transport to ED where she was found to have inferolateral STEMI.  She was subsequently transferred to [facility] for emergent PCI.  She was taken to cath lab and had stenting of the RCA (procedure note pending).  She then was transferred to the CVICU where PCCM asked to see in consultation. Upon our arrival, she is awake, following all commands on vent.  She has been weaning on PSV 5/5 and tolerating well.  Pertinent  Medical History:  has CLOSTRIDIUM DIFFICILE COLITIS; Hyperlipidemia; ANXIETY; ANXIETY DEPRESSION; TOBACCO ABUSE; Migraine; PERIPHERAL NEUROPATHY; ENDOTHELIAL CORNEAL DYSTROPHY; DISEASE, HYPERTENSIVE HEART NOS, W/O HF; PERIPHERAL VASCULAR DISEASE; DYSPLASTIC NEVUS, BACK; ASTHMA; GERD; DIVERTICULOSIS, COLON; IRRITABLE BOWEL SYNDROME; ACUTE CYSTITIS; MICROSCOPIC HEMATURIA; BREAST MASS; SEBACEOUS CYST; DEGENERATIVE DISC DISEASE, LUMBAR SPINE; BACK PAIN, LUMBAR; BACK PAIN, CHRONIC; WEIGHT LOSS; OTHER DYSPHAGIA; ABDOMINAL PAIN -GENERALIZED; GASTROINTESTINAL XRAY, ABNORMAL; CONTUSION, HIP, RIGHT; COPD exacerbation ([place]); Acute respiratory failure with hypoxia ([place]); 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[place]); Essential hypertension; Chronic pain syndrome; Overweight (BMI 25.0-29.9); Coronary artery disease involving native coronary artery of native heart without angina pectoris; Palpitations; Renal artery stenosis, non-flow-limiting ([place]); Hx of CABG; Asthma exacerbation; Anaphylactic  reaction; Hypertensive urgency; Paroxysmal tachycardia, unspecified ([place]); Acute diverticulitis; COPD with chronic bronchitis ([place]); Sepsis ([place]); Diverticulitis; H/O total shoulder replacement, left; Acute ST elevation myocardial infarction (STEMI) of inferior wall (HCC); and Acute ST elevation myocardial infarction (STEMI) involving other coronary artery of inferior wall (HCC) on their problem list.  Significant Hospital Events: Including procedures, antibiotic start and stop dates in addition to other pertinent events   12/7 admitted with inferolateral STEMI, taken for PCI of RCA, extubated, reintubated for respiratory failure  Interim History / Subjective:  Overnight required 2 doses of rocuronium.  Objective:  Blood pressure 103/67, pulse 65, temperature 98.3 F (36.8 C), temperature source Oral, resp. rate (!) 24, weight 58.8 kg, SpO2 100 %.    Vent Mode: PRVC FiO2 (%):  [40 %-50 %] 50 % Set Rate:  [24 bmp-26 bmp] 24 bmp Vt Set:  [380 mL] 380 mL PEEP:  [5 cmH20-8 cmH20] 5 cmH20 Plateau Pressure:  [11 cmH20-16 cmH20] 16 cmH20   Intake/Output Summary (Last 24 hours) at 09/25/2021 0738 Last data filed at 09/25/2021 0600 Gross per 24 hour  Intake 2527.74 ml  Output 2220 ml  Net 307.74 ml    Filed Weights   09/23/21 0710 09/24/21 0324 09/25/21 0318  Weight: 61.3 kg 59.8 kg 58.8 kg    Examination: General: critically ill appearing woman lying in bed intubated, sedated Neuro: RASS -5, not under NMB. +cough reflex, moving hands and feet nonpurposefully during exam. HEENT: [place]/AT, eyes anicteric Cardiovascular: S1S2, RRR Lungs: wheezing improved, left rhales. Minimal autoPEEP today. Peak pressures now down in low 20s. Abdomen: soft, NT Musculoskeletal: no LE edema, L>R UE edema, significant post-op bruising LUE Skin: Warm, dry, no rashes. Bruising mostly LUE, L knee.  Labs/imaging personally reviewed:   BNP 1291.5 TSH  0.177  Free t4 1.07 (WNL) 7.27/69/124/31 Na+ 134 K+  4 Mg+ 2.2 Phos 2.4 BUN 23 Cr 0.96 WBC 30.7 H/H 8.5/27.7 Platelets 456 CXR personally reviewed> LLL infiltrate, pulm edema Trach aspirate> NGTD  Assessment & Plan:   Inferolateral STEMI - s/p DES of RCA. Acute HFrEF, G3DD Moderate to severe MR-- concern for papillary muscle dysfunction 2/2 ischemia -appreciate Cardiology's management -stopping B-blocker while needing pressors -ASA, plavix, statin -tele monitoring -optimize electrolytes  Shock-- likely distributive due to sedation -norepi to maintain MAP >65  Acute on chronic respiratory failure with hypoxia and hypercapnia requiring MV Acute COPD exacerbation  Acute pulmonary edema LLL pneumonia -LTVV, 4-8cc/kg IBW with goal Pplat<30 and DP<15 -PAD protocol for sedation, has required RASS -4 to -5 for vent synchrony with severe hypercapnia. VBG once PICC placed to evaluate for resolution of respiratory acidosis before waking her up more. -VAP prevention protocol. -Antibiotics for pneumonia-- azithromycin and aztreonam (verified anaphylaxis to ceftriaxone). Would prefer to avoid quinolones. Follow trach aspirate culture. -more aggressive diuresis-- lasix 80mg  Q8h -repeat BNP level tomorrow to guide additional diuresis -Con't brovana, budesonide, and yupelri. -discussed with cardiology-- they feel she is low risk for mechanical MI complications and ok to start systemic steroids -NMB PRN-- roc more effective than nimbex for her  -daily SAT & SBT as appropriate  Chronic anemia-stable -con't to monitor -transfuse for Hb<7 or hemodynamically significant bleeding  Leukocytosis, suspect reactive, but has been chronically elevated -con't to monitor  -con't empiric antibiotics for pneumonia -CXR to evaluate for delayed presentation of pneumonia -needs close OP follow up for what seems like chronic leukocytosis  Hyponatremia, suspect hypervolemic-- improved with diuresis -con't to diurese  Hyperglycemia, diabetes (probably  new diagnosis) A1c 6.7 -SSI PRN -goal BG 140-180 while in ICU -increase TF coverage since adding steroids  AKI, resolved -strict I/OS -renally dose meds, avoid nephrotoxic meds -con't to monitor  Low TSH-- likely euthyroid sick syndrome with normal freeT4 -Free T3 still pending  Recent total shoulder arthroplasty on the left  -PT when more stable  Best practice (evaluated daily):  Diet: TF Lines: PICC line to be placed Foley: yes Code status: full Family updates: no family at bedside today; husband last updated 12/8  Labs   CBC: Recent Labs  Lab 09/23/21 0157 09/23/21 0613 09/23/21 1132 09/23/21 1304 09/23/21 2341 09/24/21 0221 09/25/21 0241  WBC 24.3*  --   --   --   --  23.3* 30.7*  HGB 8.8*  --  9.9* 10.5* 9.5* 8.7* 8.5*  HCT 28.2*  --  29.0* 31.0* 28.0* 27.8* 27.7*  MCV 88.7  --   --   --   --  90.6 90.2  PLT 413* 402*  --   --   --  331 456*     Basic Metabolic Panel: Recent Labs  Lab 09/23/21 0041 09/23/21 0157 09/23/21 1132 09/23/21 1304 09/23/21 2341 09/24/21 0221 09/24/21 1544 09/25/21 0241 09/25/21 0630  NA 131*  131* 130*   < > 135 133* 132* 133*  --  134*  K 4.2  4.2 4.0   < > 4.4 4.0 4.1 3.5  --  4.0  CL 90* 91*  --   --   --  95* 93*  --  93*  CO2  --  26  --   --   --  26 30  --  30  GLUCOSE 251* 284*  --   --   --  248* 181*  --  156*  BUN 11  11  --   --   --  20 24*  --  23  CREATININE 1.30* 1.20*  --   --   --  1.29* 1.08*  --  0.96  CALCIUM  --  7.9*  --   --   --  8.1* 7.9*  --  7.9*  MG  --   --   --   --   --  3.0* 2.4 2.2  --   PHOS  --   --   --   --   --  4.0  --  2.4*  --    < > = values in this interval not displayed.      This patient is critically ill with multiple organ system failure which requires frequent high complexity decision making, assessment, support, evaluation, and titration of therapies. This was completed through the application of advanced monitoring technologies and extensive interpretation of  multiple databases. During this encounter critical care time was devoted to patient care services described in this note for 36 minutes.  Julian Hy, DO 09/25/21 9:49 AM [facility]

## 2021-09-26 DIAGNOSIS — J9601 Acute respiratory failure with hypoxia: Secondary | ICD-10-CM | POA: Diagnosis not present

## 2021-09-26 DIAGNOSIS — I2119 ST elevation (STEMI) myocardial infarction involving other coronary artery of inferior wall: Secondary | ICD-10-CM | POA: Diagnosis not present

## 2021-09-26 LAB — GLUCOSE, CAPILLARY
Glucose-Capillary: 134 mg/dL — ABNORMAL HIGH (ref 70–99)
Glucose-Capillary: 157 mg/dL — ABNORMAL HIGH (ref 70–99)
Glucose-Capillary: 167 mg/dL — ABNORMAL HIGH (ref 70–99)
Glucose-Capillary: 238 mg/dL — ABNORMAL HIGH (ref 70–99)
Glucose-Capillary: 291 mg/dL — ABNORMAL HIGH (ref 70–99)
Glucose-Capillary: 79 mg/dL (ref 70–99)

## 2021-09-26 LAB — CBC
HCT: 25.5 % — ABNORMAL LOW (ref 36.0–46.0)
Hemoglobin: 8.1 g/dL — ABNORMAL LOW (ref 12.0–15.0)
MCH: 28.6 pg (ref 26.0–34.0)
MCHC: 31.8 g/dL (ref 30.0–36.0)
MCV: 90.1 fL (ref 80.0–100.0)
Platelets: 410 10*3/uL — ABNORMAL HIGH (ref 150–400)
RBC: 2.83 MIL/uL — ABNORMAL LOW (ref 3.87–5.11)
RDW: 14.7 % (ref 11.5–15.5)
WBC: 21.7 10*3/uL — ABNORMAL HIGH (ref 4.0–10.5)
nRBC: 0 % (ref 0.0–0.2)

## 2021-09-26 LAB — BASIC METABOLIC PANEL
Anion gap: 7 (ref 5–15)
BUN: 25 mg/dL — ABNORMAL HIGH (ref 8–23)
CO2: 41 mmol/L — ABNORMAL HIGH (ref 22–32)
Calcium: 7.8 mg/dL — ABNORMAL LOW (ref 8.9–10.3)
Chloride: 87 mmol/L — ABNORMAL LOW (ref 98–111)
Creatinine, Ser: 0.94 mg/dL (ref 0.44–1.00)
GFR, Estimated: >60 mL/min (ref >60)
Glucose, Bld: 170 mg/dL — ABNORMAL HIGH (ref 70–99)
Potassium: 4.1 mmol/L (ref 3.5–5.1)
Sodium: 135 mmol/L (ref 135–145)

## 2021-09-26 LAB — PHOSPHORUS: Phosphorus: 3 mg/dL (ref 2.5–4.6)

## 2021-09-26 LAB — MAGNESIUM: Magnesium: 2.5 mg/dL — ABNORMAL HIGH (ref 1.7–2.4)

## 2021-09-26 LAB — CULTURE, RESPIRATORY W GRAM STAIN: Culture: NO GROWTH

## 2021-09-26 MED ORDER — DEXMEDETOMIDINE HCL IN NACL 400 MCG/100ML IV SOLN
0.4000 ug/kg/h | INTRAVENOUS | Status: DC
  Administered 2021-09-27: 0.4 ug/kg/h via INTRAVENOUS
  Administered 2021-09-27: 0.8 ug/kg/h via INTRAVENOUS
  Administered 2021-09-27: 1.5 ug/kg/h via INTRAVENOUS
  Administered 2021-09-27: 1.1 ug/kg/h via INTRAVENOUS
  Administered 2021-09-28 (×2): 1 ug/kg/h via INTRAVENOUS
  Administered 2021-09-28: 1.02 ug/kg/h via INTRAVENOUS
  Administered 2021-09-29: 0.9 ug/kg/h via INTRAVENOUS
  Filled 2021-09-26 (×9): qty 100

## 2021-09-26 MED ORDER — PANTOPRAZOLE 2 MG/ML SUSPENSION
40.0000 mg | Freq: Every day | ORAL | Status: DC
Start: 2021-09-26 — End: 2021-09-30
  Administered 2021-09-26 – 2021-09-29 (×4): 40 mg
  Filled 2021-09-26 (×5): qty 20

## 2021-09-26 MED ORDER — LEVOFLOXACIN IN D5W 750 MG/150ML IV SOLN
750.0000 mg | INTRAVENOUS | Status: AC
  Administered 2021-09-26 – 2021-09-28 (×3): 750 mg via INTRAVENOUS
  Filled 2021-09-26 (×3): qty 150

## 2021-09-26 MED ORDER — FUROSEMIDE 10 MG/ML IJ SOLN
40.0000 mg | Freq: Two times a day (BID) | INTRAMUSCULAR | Status: DC
  Administered 2021-09-26 – 2021-09-28 (×5): 40 mg via INTRAVENOUS
  Filled 2021-09-26 (×5): qty 4

## 2021-09-26 NOTE — Progress Notes (Signed)
Subjective:  Patient is intubated and sedated, no family present at bedside. Presently on levophed   Intake/Output from previous day:  I/O last 3 completed shifts: In: 4482.3 [I.V.:1424.3; Other:55; NG/GT:2325; IV ZOXWRUEAV:409] Out: 8119 [Urine:4645] Total I/O In: 163.1 [I.V.:53.1; NG/GT:110] Out: 130 [Urine:130]  Blood pressure (!) 115/56, pulse (!) 113, temperature 97.9 F (36.6 C), resp. rate (!) 27, weight 59.3 kg, SpO2 100 %. Physical Exam Vitals reviewed.  Constitutional:      Comments: Intubated and sedated  HENT:     Head: Normocephalic and atraumatic.     Comments: ETT in place. Cardiovascular:     Rate and Rhythm: Normal rate and regular rhythm.     Pulses: Intact distal pulses.          Popliteal pulses are 0 on the right side and 0 on the left side.       Dorsalis pedis pulses are 0 on the right side and 0 on the left side.       Posterior tibial pulses are 0 on the right side and 0 on the left side.     Heart sounds: S1 normal and S2 normal. Heart sounds are distant. Murmur heard.  Systolic murmur is present. Faint blowing holosystolic murmur is present at the apex    No gallop.  Pulmonary:     Effort: Pulmonary effort is normal. No respiratory distress.     Breath sounds: Wheezing (bilaterally) present. No rhonchi or rales.  Musculoskeletal:     Right lower leg: No edema.     Left lower leg: No edema.     Comments: Left arm edema  Skin:    General: Skin is warm and dry.     Capillary Refill: Capillary refill takes less than 2 seconds.  Neurological:     Mental Status: She is alert and oriented to person, place, and time.  No change in exam comapred to 09/25/2021  Lab Results: BMP BNP (last 3 results) Recent Labs    09/23/21 0613 09/24/21 0221 09/25/21 0241  BNP 1,323.2* 1,495.3* 1,291.5*     ProBNP (last 3 results) No results for input(s): PROBNP in the last 8760 hours. BMP Latest Ref Rng & Units 09/26/2021 09/25/2021 09/24/2021  Glucose 70 - 99  mg/dL 170(H) 156(H) 181(H)  BUN 8 - 23 mg/dL 25(H) 23 24(H)  Creatinine 0.44 - 1.00 mg/dL 0.94 0.96 1.08(H)  Sodium 135 - 145 mmol/L 135 134(L) 133(L)  Potassium 3.5 - 5.1 mmol/L 4.1 4.0 3.5  Chloride 98 - 111 mmol/L 87(L) 93(L) 93(L)  CO2 22 - 32 mmol/L 41(H) 30 30  Calcium 8.9 - 10.3 mg/dL 7.8(L) 7.9(L) 7.9(L)   Hepatic Function Latest Ref Rng & Units 09/24/2021 03/29/2020 03/22/2020  Total Protein 6.5 - 8.1 g/dL 6.0(L) 6.8 8.0  Albumin 3.5 - 5.0 g/dL 2.7(L) 3.7 4.3  AST 15 - 41 U/L 53(H) 27 40  ALT 0 - 44 U/L 37 21 18  Alk Phosphatase 38 - 126 U/L 87 98 121  Total Bilirubin 0.3 - 1.2 mg/dL 0.6 1.0 0.6  Bilirubin, Direct 0.0 - 0.3 mg/dL - - -   CBC Latest Ref Rng & Units 09/26/2021 09/25/2021 09/24/2021  WBC 4.0 - 10.5 K/uL 21.7(H) 30.7(H) 23.3(H)  Hemoglobin 12.0 - 15.0 g/dL 8.1(L) 8.5(L) 8.7(L)  Hematocrit 36.0 - 46.0 % 25.5(L) 27.7(L) 27.8(L)  Platelets 150 - 400 K/uL 410(H) 456(H) 331   Lipid Panel     Component Value Date/Time   CHOL 161 09/24/2021 0221   TRIG 121  09/24/2021 0221   HDL 27 (L) 09/24/2021 0221   CHOLHDL 6.0 09/24/2021 0221   VLDL 24 09/24/2021 0221   LDLCALC 110 (H) 09/24/2021 0221   LDLDIRECT 173.8 10/24/2009 1152   Cardiac Panel (last 3 results) No results for input(s): CKTOTAL, CKMB, TROPONINI, RELINDX in the last 72 hours.  HEMOGLOBIN A1C Lab Results  Component Value Date   HGBA1C 6.7 (H) 09/17/2021   MPG 146 09/17/2021   TSH Recent Labs    09/24/21 0221  TSH 0.177*     Imaging: Imaging results have been reviewed  Cardiac Studies: Echocardiogram 09/23/2021: 1. Left ventricular ejection fraction, by estimation, is 45 to 50%. Left  ventricular ejection fraction by PLAX is 54 %. The left ventricle has  normal function. The left ventricle demonstrates regional wall motion  abnormalities (see scoring  diagram/findings for description). Left ventricular diastolic parameters  are consistent with Grade III diastolic dysfunction (restrictive).   Elevated left ventricular end-diastolic pressure. There is mild  hypokinesis of the left ventricular, entire  inferoseptal wall, inferior wall and inferolateral wall.   2. Right ventricular systolic function is normal. The right ventricular  size is normal. There is moderately elevated pulmonary artery systolic pressure. The estimated right ventricular systolic pressure is 70.1 mmHg.   3. Left atrial size was mildly dilated.   4. Moderate to at most moderately severe posteriorly directed MR.  Consider papillary muscle dysfunction. The mitral valve is normal in  structure. Moderate to severe mitral valve regurgitation. No evidence of  mitral stenosis.   5. The aortic valve is normal in structure. Aortic valve regurgitation is not visualized. No aortic stenosis is present.   6. The inferior vena cava is dilated in size with <50% respiratory  variability, suggesting right atrial pressure of 15 mmHg.   Left Heart Catheterization 09/23/21:  RCA diffuse in the proximal and mid RCA with ulceration and scattered 80-99% stenosis.  Successful PCI to ulcerated high grade native RCA with implantation of 3.0x48 mm Synergy SD in the proximal to mid RCA. Timi 3 to 3 flow. LM diffuse 80% stenosis.  LAD patent with mild disease and LIMA to LAD patent.  CX: Moderate OM1 widely patent. Cx severely diseased after origin of OM 1 with 90-95% stenosis.  Small diseased OM-2 RI: Small with ostial mild disease.  SVG to OM1-2 skip graft occluded, SVG to RCA occluded.  LV: EF 35-40% with inferior akinesis. No MR.  Hemodynamics: LV 112/18, EDP 25 mm Hg. Ao 114/80, mean 112 mm Hg. No pressure gradient across the AV.  90 mL contrast used  Echocardiogram 03/24/2020:     1. Left ventricular ejection fraction, by estimation, is 55 to 60%. The  left ventricle has normal function. The left ventricle has no regional  wall motion abnormalities. There is moderate left ventricular hypertrophy.  Left ventricular diastolic  parameters are consistent with Grade I diastolic dysfunction (impaired relaxation).   2. Right ventricular systolic function is normal. The right ventricular  size is normal. Tricuspid regurgitation signal is inadequate for assessing PA pressure.   3. The mitral valve is normal in structure. No evidence of mitral valve regurgitation. No evidence of mitral stenosis.   4. The aortic valve is normal in structure. Aortic valve regurgitation is not visualized. No aortic stenosis is present.   5. The inferior vena cava is normal in size with greater than 50%  respiratory variability, suggesting right atrial pressure of 3 mmHg.   CABG 04/29/2016: LIMA to LAD, SVG to RCA, SVG  to OM1, SVG to OM2 for left main 90% stenosis 50% stenosis in circumflex and 60% stenosis in OM1, 50% stenosis in proximal RCA.  EKG 09/22/21 at 22:59 hours: Acute injury pattern in the inferior leads with reciprocal ST depression in lateral leads. Underlying Mobitz I AV Block.     03/29/2020: Sinus tachycardia at rate of 106 bpm, poor R wave progression, probably normal variant.  No evidence of ischemia.  Scheduled Meds:  arformoterol  15 mcg Nebulization BID   artificial tears  1 application Both Eyes G9Q   aspirin  81 mg Per Tube Daily   atorvastatin  80 mg Per Tube Daily   budesonide (PULMICORT) nebulizer solution  0.25 mg Nebulization BID   chlorhexidine gluconate (MEDLINE KIT)  15 mL Mouth Rinse BID   Chlorhexidine Gluconate Cloth  6 each Topical Daily   docusate  100 mg Per Tube BID   fentaNYL (SUBLIMAZE) injection  50 mcg Intravenous Once   furosemide  40 mg Intravenous BID   heparin  5,000 Units Subcutaneous Q8H   insulin aspart  0-15 Units Subcutaneous Q4H   insulin aspart  5 Units Subcutaneous Q4H   ipratropium-albuterol  3 mL Nebulization Q4H   mouth rinse  15 mL Mouth Rinse 10 times per day   methylPREDNISolone (SOLU-MEDROL) injection  40 mg Intravenous Q24H   pantoprazole sodium  40 mg Per Tube Daily    polyethylene glycol  17 g Per Tube Daily   revefenacin  175 mcg Nebulization Daily   sodium chloride flush  10-40 mL Intracatheter Q12H   sodium chloride flush  3 mL Intravenous Q12H   ticagrelor  90 mg Per Tube BID   Continuous Infusions:  sodium chloride     sodium chloride Stopped (09/23/21 1753)   sodium chloride     dexmedetomidine (PRECEDEX) IV infusion     feeding supplement (VITAL AF 1.2 CAL) 1,000 mL (09/26/21 1151)   fentaNYL infusion INTRAVENOUS 150 mcg/hr (09/26/21 0600)   levofloxacin (LEVAQUIN) IV     midazolam 4 mg/hr (09/26/21 0600)   norepinephrine (LEVOPHED) Adult infusion Stopped (09/26/21 0905)   PRN Meds:.sodium chloride, Place/Maintain arterial line **AND** sodium chloride, acetaminophen, fentaNYL, midazolam, ondansetron (ZOFRAN) IV, oxyCODONE-acetaminophen **AND** oxyCODONE, promethazine, sodium chloride flush, sodium chloride flush    Assessment/Plan:  Inferior lateral STEMI status post RCA stenting Recommend uninterrupted dual antiplatelet therapy with Aspirin $RemoveBefo'81mg'[id number]$  daily and Ticagrelor $RemoveBefore'90mg'[id number]$  twice daily for a minimum of 12 months   Continue statin therapy, atorvastatin 80 mg daily  Not on any BB or ACEi due to hypotension.    Respiratory failure/COPD exacerbation: Predominately COPD exacerbation  Will defer primary management to PCCM at this time as respiratory failure is primarily due to COPD and PCCM is also managing volume status.  Will obtain chest x-ray and continue to monitor BNP  Mitral regurgitation: Moderate to moderately severe mitral regurgitation is due to papillary muscle dysfunction in view of recent right coronary artery involvement.   Do not suspect mechanical complications.   Hopeful this will improve  Acute diastolic heart failure, continue diuresis for now with lasix BID.   Will wait on pulmonary stabilization and follow sidelines.   Adrian Prows, MD, [facility] 09/26/2021, 1:08 PM Office: [phone] Fax: [phone] Pager:  [phone]

## 2021-09-26 NOTE — Progress Notes (Signed)
NAME:  [name], MRN:  [id number], DOB:  1956/01/23, LOS: 3 ADMISSION DATE:  09/23/2021, CONSULTATION DATE:  09/23/21 REFERRING MD:  Jacinto Halim CHIEF COMPLAINT:  STEMI   History of Present Illness:  Pt is encephelopathic; therefore, this HPI is obtained from chart review. [name] is a 65 y.o. female who has a PMH as outlined below including CAD s/p CABG x 4, HTN, HLD, DM, COPD.  She presented to [facility] ED 12/7 after husband found at her at home unresponsive.  EMS was called and she was intubated in the field prior to transport to ED where she was found to have inferolateral STEMI.  She was subsequently transferred to [facility] [place] for emergent PCI.  She was taken to cath lab and had stenting of the RCA (procedure note pending).  She then was transferred to the CVICU where PCCM asked to see in consultation. Upon our arrival, she is awake, following all commands on vent.  She has been weaning on PSV 5/5 and tolerating well.  Pertinent  Medical History:  has CLOSTRIDIUM DIFFICILE COLITIS; Hyperlipidemia; ANXIETY; ANXIETY DEPRESSION; TOBACCO ABUSE; Migraine; PERIPHERAL NEUROPATHY; ENDOTHELIAL CORNEAL DYSTROPHY; DISEASE, HYPERTENSIVE HEART NOS, W/O HF; PERIPHERAL VASCULAR DISEASE; DYSPLASTIC NEVUS, BACK; ASTHMA; GERD; DIVERTICULOSIS, COLON; IRRITABLE BOWEL SYNDROME; ACUTE CYSTITIS; MICROSCOPIC HEMATURIA; BREAST MASS; SEBACEOUS CYST; DEGENERATIVE DISC DISEASE, LUMBAR SPINE; BACK PAIN, LUMBAR; BACK PAIN, CHRONIC; WEIGHT LOSS; OTHER DYSPHAGIA; ABDOMINAL PAIN -GENERALIZED; GASTROINTESTINAL XRAY, ABNORMAL; CONTUSION, HIP, RIGHT; COPD exacerbation (HCC); Acute respiratory failure with hypoxia (HCC); 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HCC); Essential hypertension; Chronic pain syndrome; Overweight (BMI 25.0-29.9); Coronary artery disease involving native coronary artery of native heart without angina pectoris; Palpitations; Renal artery stenosis, non-flow-limiting (HCC); Hx of CABG; Asthma exacerbation; Anaphylactic  reaction; Hypertensive urgency; Paroxysmal tachycardia, unspecified (HCC); Acute diverticulitis; COPD with chronic bronchitis (HCC); Sepsis (HCC); Diverticulitis; H/O total shoulder replacement, left; Acute ST elevation myocardial infarction (STEMI) of inferior wall (HCC); and Acute ST elevation myocardial infarction (STEMI) involving other coronary artery of inferior wall (HCC) on their problem list.  Significant Hospital Events: Including procedures, antibiotic start and stop dates in addition to other pertinent events   12/7 admitted with inferolateral STEMI, taken for PCI of RCA, extubated, reintubated for respiratory failure  Interim History / Subjective:  Off pressors. Deeply sedated on versed and fentanyl, minimal vent settings  Objective:  Blood pressure (!) 115/56, pulse (!) 113, temperature 97.9 F (36.6 C), resp. rate (!) 27, weight 59.3 kg, SpO2 97 %.    Vent Mode: PRVC FiO2 (%):  [30 %-50 %] 40 % Set Rate:  [24 bmp] 24 bmp Vt Set:  [380 mL] 380 mL PEEP:  [5 cmH20] 5 cmH20 Plateau Pressure:  [16 cmH20-22 cmH20] 19 cmH20   Intake/Output Summary (Last 24 hours) at 09/26/2021 1003 Last data filed at 09/26/2021 0900 Gross per 24 hour  Intake 2886.4 ml  Output 3860 ml  Net -973.6 ml   Filed Weights   09/24/21 0324 09/25/21 0318 09/26/21 0530  Weight: 59.8 kg 58.8 kg 59.3 kg    Examination: Gen:      Intubated, sedated, acutely ill appearing, thin, chronically ill appearing HEENT:  ETT to vent Lungs:    sounds of mechanical ventilation auscultated, diminished bialterally, no wheezes or crackles CV:         RRR no mrg Abd:      + bowel sounds; soft, non-tender; no palpable masses, no distension Ext:    No edema Skin:      Warm and dry; no rashes Neuro:  sedated, RASS -4   Labs/imaging personally reviewed:  BNP elevated Na 135 Phos 3.0 Glucose 140-180 Cr 0.94 WBC 21 Hgb 8.1  Assessment & Plan:   Acute on chronic respiratory failure with hypoxia and  hypercapnia requiring MV Acute COPD exacerbation  Acute pulmonary edema LLL pneumonia -LTVV, 4-8cc/kg IBW with goal Pplat<30 and DP<15 - stop versed, lighten to precedex - stop prn paralytics -VAP prevention protocol. -narrow abx to levofloxacin monotherapy total 5 days abx -more aggressive diuresis-- lasix 80mg  Q8h -repeat BNP level tomorrow to guide additional diuresis -Con't brovana, budesonide, and yupelri. -methylprednisone 40 mg daily x 5 days tota -daily SAT & SBT as appropriate - redose with lasix to mg IV BID  Inferolateral STEMI - s/p DES of RCA. Acute HFrEF, G3DD Moderate to severe MR-- concern for papillary muscle dysfunction 2/2 ischemia -appreciate Cardiology's management -stopping B-blocker while needing pressors -ASA, plavix, statin -tele monitoring -optimize electrolytes  Shock-- likely distributive due to sedation -norepi to maintain MAP >65 - now off pressors  Chronic anemia-stable -con't to monitor -transfuse for Hb<7 or hemodynamically significant bleeding  Leukocytosis, suspect reactive, but has been chronically elevated -con't to monitor  -con't empiric antibiotics for pneumonia -CXR to evaluate for delayed presentation of pneumonia -needs close OP follow up for what seems like chronic leukocytosis  Hyponatremia, suspect hypervolemic-- improved with diuresis -con't to diurese  Hyperglycemia, diabetes (probably new diagnosis) A1c 6.7 -SSI PRN -goal BG 140-180 while in ICU -increase TF coverage since adding steroids  AKI, resolved -strict I/OS -renally dose meds, avoid nephrotoxic meds -con't to monitor  Low TSH-- likely euthyroid sick syndrome with normal freeT4 -Free T3 still pending  Recent total shoulder arthroplasty on the left  -PT when more stable  Best practice (evaluated daily):  Diet: TF Lines: PICC line to be placed Foley: yes Code status: full Family updates: no family at bedside today; husband last updated 12/8  Labs    CBC: Recent Labs  Lab 09/23/21 0157 09/23/21 0613 09/23/21 1132 09/23/21 1304 09/23/21 2341 09/24/21 0221 09/25/21 0241 09/26/21 0356  WBC 24.3*  --   --   --   --  23.3* 30.7* 21.7*  HGB 8.8*  --    < > 10.5* 9.5* 8.7* 8.5* 8.1*  HCT 28.2*  --    < > 31.0* 28.0* 27.8* 27.7* 25.5*  MCV 88.7  --   --   --   --  90.6 90.2 90.1  PLT 413* 402*  --   --   --  331 456* 410*   < > = values in this interval not displayed.    Basic Metabolic Panel: Recent Labs  Lab 09/23/21 0157 09/23/21 1132 09/23/21 2341 09/24/21 0221 09/24/21 1544 09/25/21 0241 09/25/21 0630 09/26/21 0356  NA 130*   < > 133* 132* 133*  --  134* 135  K 4.0   < > 4.0 4.1 3.5  --  4.0 4.1  CL 91*  --   --  95* 93*  --  93* 87*  CO2 26  --   --  26 30  --  30 41*  GLUCOSE 284*  --   --  248* 181*  --  156* 170*  BUN 11  --   --  20 24*  --  23 25*  CREATININE 1.20*  --   --  1.29* 1.08*  --  0.96 0.94  CALCIUM 7.9*  --   --  8.1* 7.9*  --  7.9* 7.8*  MG  --   --   --  3.0* 2.4 2.2  --  2.5*  PHOS  --   --   --  4.0  --  2.4*  --  3.0   < > = values in this interval not displayed.   The patient is critically ill due to respiratory failure.  Critical care was necessary to treat or prevent imminent or life-threatening deterioration.  Critical care was time spent personally by me on the following activities: development of treatment plan with patient and/or surrogate as well as nursing, discussions with consultants, evaluation of patient's response to treatment, examination of patient, obtaining history from patient or surrogate, ordering and performing treatments and interventions, ordering and review of laboratory studies, ordering and review of radiographic studies, pulse oximetry, re-evaluation of patient's condition and participation in multidisciplinary rounds.   Critical Care Time devoted to patient care services described in this note is 46 minutes. This time reflects time of care of this [place]  . This critical care time does not reflect separately billable procedures or procedure time, teaching time or supervisory time of PA/NP/Med student/Med Resident etc but could involve care discussion time.       [facility]one [facility] 09/26/2021 10:03 AM  Pager: see AMION  If no response to pager , please call critical care on call (see AMION) until 7pm After 7:00 pm call Elink

## 2021-09-27 DIAGNOSIS — I2119 ST elevation (STEMI) myocardial infarction involving other coronary artery of inferior wall: Secondary | ICD-10-CM | POA: Diagnosis not present

## 2021-09-27 DIAGNOSIS — J9621 Acute and chronic respiratory failure with hypoxia: Secondary | ICD-10-CM

## 2021-09-27 DIAGNOSIS — G9341 Metabolic encephalopathy: Secondary | ICD-10-CM

## 2021-09-27 DIAGNOSIS — J441 Chronic obstructive pulmonary disease with (acute) exacerbation: Secondary | ICD-10-CM | POA: Diagnosis not present

## 2021-09-27 DIAGNOSIS — J9622 Acute and chronic respiratory failure with hypercapnia: Secondary | ICD-10-CM

## 2021-09-27 LAB — BASIC METABOLIC PANEL
Anion gap: 7 (ref 5–15)
BUN: 34 mg/dL — ABNORMAL HIGH (ref 8–23)
CO2: 43 mmol/L — ABNORMAL HIGH (ref 22–32)
Calcium: 8.1 mg/dL — ABNORMAL LOW (ref 8.9–10.3)
Chloride: 88 mmol/L — ABNORMAL LOW (ref 98–111)
Creatinine, Ser: 0.96 mg/dL (ref 0.44–1.00)
GFR, Estimated: >60 mL/min (ref >60)
Glucose, Bld: 172 mg/dL — ABNORMAL HIGH (ref 70–99)
Potassium: 3.9 mmol/L (ref 3.5–5.1)
Sodium: 138 mmol/L (ref 135–145)

## 2021-09-27 LAB — GLUCOSE, CAPILLARY
Glucose-Capillary: 125 mg/dL — ABNORMAL HIGH (ref 70–99)
Glucose-Capillary: 152 mg/dL — ABNORMAL HIGH (ref 70–99)
Glucose-Capillary: 188 mg/dL — ABNORMAL HIGH (ref 70–99)
Glucose-Capillary: 197 mg/dL — ABNORMAL HIGH (ref 70–99)
Glucose-Capillary: 240 mg/dL — ABNORMAL HIGH (ref 70–99)
Glucose-Capillary: 67 mg/dL — ABNORMAL LOW (ref 70–99)

## 2021-09-27 LAB — MAGNESIUM: Magnesium: 2.3 mg/dL (ref 1.7–2.4)

## 2021-09-27 LAB — PHOSPHORUS: Phosphorus: 3.5 mg/dL (ref 2.5–4.6)

## 2021-09-27 MED ORDER — HALOPERIDOL LACTATE 5 MG/ML IJ SOLN
5.0000 mg | Freq: Four times a day (QID) | INTRAMUSCULAR | Status: DC | PRN
  Administered 2021-09-27 – 2021-09-28 (×2): 5 mg via INTRAVENOUS
  Filled 2021-09-27 (×2): qty 1

## 2021-09-27 MED ORDER — DIAZEPAM 5 MG PO TABS
10.0000 mg | ORAL_TABLET | Freq: Once | ORAL | Status: AC
  Administered 2021-09-27: 10 mg
  Filled 2021-09-27: qty 2

## 2021-09-27 MED ORDER — OXYCODONE HCL 5 MG/5ML PO SOLN
5.0000 mg | Freq: Three times a day (TID) | ORAL | Status: DC
  Administered 2021-09-27 – 2021-09-29 (×7): 5 mg
  Filled 2021-09-27 (×7): qty 5

## 2021-09-27 MED ORDER — ACETYLCYSTEINE 20 % IN SOLN
4.0000 mL | Freq: Four times a day (QID) | RESPIRATORY_TRACT | Status: DC
  Administered 2021-09-27 – 2021-09-28 (×5): 4 mL via RESPIRATORY_TRACT
  Filled 2021-09-27 (×7): qty 4

## 2021-09-27 MED ORDER — IPRATROPIUM-ALBUTEROL 0.5-2.5 (3) MG/3ML IN SOLN
3.0000 mL | Freq: Four times a day (QID) | RESPIRATORY_TRACT | Status: DC
  Administered 2021-09-27 – 2021-10-01 (×14): 3 mL via RESPIRATORY_TRACT
  Filled 2021-09-27 (×15): qty 3

## 2021-09-27 MED ORDER — DEXTROSE 50 % IV SOLN
INTRAVENOUS | Status: AC
  Administered 2021-09-27: 50 mL
  Filled 2021-09-27: qty 50

## 2021-09-27 NOTE — Progress Notes (Signed)
Verified with Celine Mans, MD that patient keep Cortrak and tube feed running post- extubation to BiPAP.

## 2021-09-27 NOTE — Progress Notes (Addendum)
eLink Physician-Brief Progress Note Patient Name: [name] DOB: 29-Nov-1955 MRN: [id number]   Date of Service  09/27/2021  HPI/Events of Note  Intermittent agitation pulling lines and BiPAP  eICU Interventions  -Increased Precedex ceiling and added PRN Haldol -EKG in am to monitor Qtc  4:22 AM Hypoglycemia. Given D50. F/u CBG 174 - Hold scheduled novlog 5U now and recheck CBG as scheduled     Intervention Category Minor Interventions: Agitation / anxiety - evaluation and management  Cecilio Ohlrich Mechele Collin 09/27/2021, 11:11 PM

## 2021-09-27 NOTE — Progress Notes (Signed)
Patient is NOT tolerating BIPAP. Patient is confused only alert to self. Patient has pulled mask off and has disconnected tubing multiple times so far this shift. PRN medications given with zero affect so far. Medications increased to help with anxiety and pain with zero affect so far. Patient is a risk for harm d/t non compliance with medical interventions. RT came to bedside to adjust patient care equipment for comfort, and patient continues to interfere with care. MD contacted through secure chat for other interventions.

## 2021-09-27 NOTE — Progress Notes (Signed)
NAME:  [name], MRN:  [id number], DOB:  01/12/1956, LOS: 4 ADMISSION DATE:  09/23/2021, CONSULTATION DATE:  09/23/21 REFERRING MD:  Einar Gip CHIEF COMPLAINT:  STEMI   History of Present Illness:  Pt is encephelopathic; therefore, this HPI is obtained from chart review. [name] is a 65 y.o. female who has a PMH as outlined below including CAD s/p CABG x 4, HTN, HLD, DM, COPD.  She presented to [facility] ED 12/7 after husband found at her at home unresponsive.  EMS was called and she was intubated in the field prior to transport to ED where she was found to have inferolateral STEMI.  She was subsequently transferred to [facility] for emergent PCI.  She was taken to cath lab and had stenting of the RCA (procedure note pending).  She then was transferred to the CVICU where PCCM asked to see in consultation. Upon our arrival, she is awake, following all commands on vent.  She has been weaning on PSV 5/5 and tolerating well.  Pertinent  Medical History:  has CLOSTRIDIUM DIFFICILE COLITIS; Hyperlipidemia; ANXIETY; ANXIETY DEPRESSION; TOBACCO ABUSE; Migraine; PERIPHERAL NEUROPATHY; ENDOTHELIAL CORNEAL DYSTROPHY; DISEASE, HYPERTENSIVE HEART NOS, W/O HF; PERIPHERAL VASCULAR DISEASE; DYSPLASTIC NEVUS, BACK; ASTHMA; GERD; DIVERTICULOSIS, COLON; IRRITABLE BOWEL SYNDROME; ACUTE CYSTITIS; MICROSCOPIC HEMATURIA; BREAST MASS; SEBACEOUS CYST; DEGENERATIVE DISC DISEASE, LUMBAR SPINE; BACK PAIN, LUMBAR; BACK PAIN, CHRONIC; WEIGHT LOSS; OTHER DYSPHAGIA; ABDOMINAL PAIN -GENERALIZED; GASTROINTESTINAL XRAY, ABNORMAL; CONTUSION, HIP, RIGHT; COPD exacerbation ([place]); Acute respiratory failure with hypoxia (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[place]); Essential hypertension; Chronic pain syndrome; Overweight (BMI 25.0-29.9); Coronary artery disease involving native coronary artery of native heart without angina pectoris; Palpitations; Renal artery stenosis, non-flow-limiting ([place]); Hx of CABG; Asthma exacerbation; Anaphylactic  reaction; Hypertensive urgency; Paroxysmal tachycardia, unspecified ([place]); Acute diverticulitis; COPD with chronic bronchitis ([place]); Sepsis ([place]); Diverticulitis; H/O total shoulder replacement, left; Acute ST elevation myocardial infarction (STEMI) of inferior wall (HCC); and Acute ST elevation myocardial infarction (STEMI) involving other coronary artery of inferior wall (HCC) on their problem list.  Significant Hospital Events: Including procedures, antibiotic start and stop dates in addition to other pertinent events   12/7 admitted with inferolateral STEMI, taken for PCI of RCA, extubated, reintubated for respiratory failure  Interim History / Subjective:  Some desaturations overnight Coming down on versed for precedex  Objective:  Blood pressure (!) 110/56, pulse (!) 113, temperature 99.1 F (37.3 C), resp. rate (!) 21, weight 59.3 kg, SpO2 100 %.    Vent Mode: PSV;CPAP FiO2 (%):  [40 %-100 %] 40 % Set Rate:  [24 bmp] 24 bmp Vt Set:  [380 mL] 380 mL PEEP:  [5 cmH20] 5 cmH20 Pressure Support:  [10 cmH20] 10 cmH20 Plateau Pressure:  [18 cmH20-21 cmH20] 18 cmH20   Intake/Output Summary (Last 24 hours) at 09/27/2021 1242 Last data filed at 09/27/2021 1200 Gross per 24 hour  Intake 2338.89 ml  Output 2575 ml  Net -236.11 ml   Filed Weights   09/24/21 0324 09/25/21 0318 09/26/21 0530  Weight: 59.8 kg 58.8 kg 59.3 kg    Examination: Gen:      Intubated, sedated, acutely ill appearing HEENT:  ETT to vent Lungs:    sounds of mechanical ventilation auscultated, diminished bilaterally decreased air entry, no wheeze CV:         Tachycardic, regular Abd:      + bowel sounds; soft, non-tender; no palpable masses, no distension Ext:    No edema Skin:      Warm and dry; no rashes  Neuro:   sedated, RASS -1 to +1, moves all 4 extremities    Labs/imaging personally reviewed:  Na 138 Cr 0.96, CO2 43 CBG 125  Assessment & Plan:   Acute on chronic respiratory failure with hypoxia  and hypercapnia requiring MV Acute COPD exacerbation  Acute pulmonary edema LLL pneumonia -LTVV, 4-8cc/kg IBW with goal Pplat<30 and DP<15 - titrate off versed, maintain precedex and fentanyl -VAP prevention protocol. -narrow abx to levofloxacin monotherapy total 5 days abx -continue diuresis with lasix 40 mg IV BID, she was net even yesterday -Con't brovana, budesonide, and yupelri. -methylprednisone 40 mg daily x 5 days total -daily SAT & SBT as appropriate. May extubate to bipap today if awake enough.  Inferolateral STEMI - s/p DES of RCA. Acute HFrEF, G3DD Moderate to severe MR-- concern for papillary muscle dysfunction 2/2 ischemia Cardiogenic shock resolved -appreciate Cardiology's management -stopping B-blocker while needing pressors -ASA, plavix, statin -tele monitoring -optimize electrolytes  Chronic anemia-stable -con't to monitor -transfuse for Hb<7 or hemodynamically significant bleeding  Leukocytosis, suspect reactive, but has been chronically elevated -con't to monitor  -con't empiric antibiotics for pneumonia -CXR to evaluate for delayed presentation of pneumonia -needs close OP follow up for what seems like chronic leukocytosis  Hyperglycemia, diabetes (probably new diagnosis) A1c 6.7 -SSI PRN -goal BG 140-180 while in ICU -continue TF coverage  AKI, resolved -strict I/OS -renally dose meds, avoid nephrotoxic meds -con't to monitor  Low TSH-- likely euthyroid sick syndrome with normal freeT4 -Free T3 still pending  Recent total shoulder arthroplasty on the left  -PT when more stable - scheduling oxycodone for pain  Best practice (evaluated daily):  Diet: TF Lines: PICC line to be placed Foley: yes Code status: full Family updates: no family at bedside today; husband last updated 12/8. Will update again today.   Labs   CBC: Recent Labs  Lab 09/23/21 0157 09/23/21 [id number] 09/23/21 1132 09/23/21 1304 09/23/21 2341 09/24/21 0221  09/25/21 0241 09/26/21 0356  WBC 24.3*  --   --   --   --  23.3* 30.7* 21.7*  HGB 8.8*  --    < > 10.5* 9.5* 8.7* 8.5* 8.1*  HCT 28.2*  --    < > 31.0* 28.0* 27.8* 27.7* 25.5*  MCV 88.7  --   --   --   --  90.6 90.2 90.1  PLT 413* 402*  --   --   --  331 456* 410*   < > = values in this interval not displayed.    Basic Metabolic Panel: Recent Labs  Lab 09/24/21 0221 09/24/21 1544 09/25/21 0241 09/25/21 0630 09/26/21 0356 09/27/21 0356  NA 132* 133*  --  134* 135 138  K 4.1 3.5  --  4.0 4.1 3.9  CL 95* 93*  --  93* 87* 88*  CO2 26 30  --  30 41* 43*  GLUCOSE 248* 181*  --  156* 170* 172*  BUN 20 24*  --  23 25* 34*  CREATININE 1.29* 1.08*  --  0.96 0.94 0.96  CALCIUM 8.1* 7.9*  --  7.9* 7.8* 8.1*  MG 3.0* 2.4 2.2  --  2.5* 2.3  PHOS 4.0  --  2.4*  --  3.0 3.5   The patient is critically ill due to respiratory failure.  Critical care was necessary to treat or prevent imminent or life-threatening deterioration.  Critical care was time spent personally by me on the following activities: development of treatment plan with patient and/or surrogate  as well as nursing, discussions with consultants, evaluation of patient's response to treatment, examination of patient, obtaining history from patient or surrogate, ordering and performing treatments and interventions, ordering and review of laboratory studies, ordering and review of radiographic studies, pulse oximetry, re-evaluation of patient's condition and participation in multidisciplinary rounds.   Critical Care Time devoted to patient care services described in this note is 41 minutes. This time reflects time of care of this signee Charlott Holler . This critical care time does not reflect separately billable procedures or procedure time, teaching time or supervisory time of PA/NP/Med student/Med Resident etc but could involve care discussion time.       Charlott Holler [facility] 09/27/2021 12:42 PM   Pager: see AMION  If no response to pager , please call critical care on call (see AMION) until 7pm After 7:00 pm call Elink

## 2021-09-27 NOTE — Progress Notes (Signed)
Patient sitting up in bed, distressed, coughing and asynchronous with vent. O2 sats declining. Suction provided via ET tube, resistance encountered when passing catheter, minimal secretions removed. RT called to bedside administered 02 breaths via bag, 02 sats improved and lavage with suction provided, large mucous plug removed. Vital signs stable, but patient continued to be very agitated, sedation increased for ventilator tolerance.   09/27/21 0310  Vitals  Temp 99.5 F (37.5 C)  Pulse Rate (!) 133  ECG Heart Rate (!) 137  Resp 10  Oxygen Therapy  SpO2 (!) 77 %  MEWS Score  MEWS Temp 0  MEWS Systolic 0  MEWS Pulse 3  MEWS RR 1  MEWS LOC 1  MEWS Score 5  MEWS Score Color Red

## 2021-09-27 NOTE — Progress Notes (Signed)
RT walking in unit and heard bipap alarm.  RN was trying get pt to stop pulling mask off.  RT replaced mask but pt continued to pull at it.  RN and RT explained the importance of wearing the bipap.  Pt nodded her head when asked if she understood.  RT will continue to monitor.

## 2021-09-27 NOTE — Progress Notes (Signed)
Called to bedside by RN due to SPO2 in 70's. Upon my arrival, RN hooking up AMBU and pt SPO2 continuing to decline. RT began to bag and pt very difficult to bag. RT lavaged and received large dime sized plug out of tube. SPO2 100% post suction and placed back on vent with previous settings with the exception of Fio2 100% to help recover.

## 2021-09-28 DIAGNOSIS — J9601 Acute respiratory failure with hypoxia: Secondary | ICD-10-CM | POA: Diagnosis not present

## 2021-09-28 DIAGNOSIS — I340 Nonrheumatic mitral (valve) insufficiency: Secondary | ICD-10-CM | POA: Diagnosis not present

## 2021-09-28 DIAGNOSIS — J418 Mixed simple and mucopurulent chronic bronchitis: Secondary | ICD-10-CM | POA: Diagnosis not present

## 2021-09-28 DIAGNOSIS — I25110 Atherosclerotic heart disease of native coronary artery with unstable angina pectoris: Secondary | ICD-10-CM

## 2021-09-28 LAB — BASIC METABOLIC PANEL
Anion gap: 7 (ref 5–15)
BUN: 31 mg/dL — ABNORMAL HIGH (ref 8–23)
CO2: 41 mmol/L — ABNORMAL HIGH (ref 22–32)
Calcium: 8.4 mg/dL — ABNORMAL LOW (ref 8.9–10.3)
Chloride: 92 mmol/L — ABNORMAL LOW (ref 98–111)
Creatinine, Ser: 0.9 mg/dL (ref 0.44–1.00)
GFR, Estimated: >60 mL/min (ref >60)
Glucose, Bld: 189 mg/dL — ABNORMAL HIGH (ref 70–99)
Potassium: 3.7 mmol/L (ref 3.5–5.1)
Sodium: 140 mmol/L (ref 135–145)

## 2021-09-28 LAB — GLUCOSE, CAPILLARY
Glucose-Capillary: 105 mg/dL — ABNORMAL HIGH (ref 70–99)
Glucose-Capillary: 162 mg/dL — ABNORMAL HIGH (ref 70–99)
Glucose-Capillary: 167 mg/dL — ABNORMAL HIGH (ref 70–99)
Glucose-Capillary: 175 mg/dL — ABNORMAL HIGH (ref 70–99)
Glucose-Capillary: 181 mg/dL — ABNORMAL HIGH (ref 70–99)
Glucose-Capillary: 192 mg/dL — ABNORMAL HIGH (ref 70–99)
Glucose-Capillary: 345 mg/dL — ABNORMAL HIGH (ref 70–99)

## 2021-09-28 MED ORDER — QUETIAPINE FUMARATE 25 MG PO TABS
25.0000 mg | ORAL_TABLET | Freq: Two times a day (BID) | ORAL | Status: DC
  Administered 2021-09-28 – 2021-09-30 (×5): 25 mg
  Filled 2021-09-28 (×5): qty 1

## 2021-09-28 MED ORDER — LORAZEPAM 2 MG/ML IJ SOLN
2.0000 mg | Freq: Once | INTRAMUSCULAR | Status: AC
  Administered 2021-09-28: 2 mg via INTRAVENOUS
  Filled 2021-09-28: qty 1

## 2021-09-28 MED ORDER — POTASSIUM CHLORIDE 20 MEQ PO PACK
40.0000 meq | PACK | Freq: Once | ORAL | Status: AC
  Administered 2021-09-28: 40 meq
  Filled 2021-09-28: qty 2

## 2021-09-28 MED ORDER — FENTANYL CITRATE PF 50 MCG/ML IJ SOSY: 50.0000 ug | PREFILLED_SYRINGE | INTRAMUSCULAR | Status: DC | PRN

## 2021-09-28 MED ORDER — NICOTINE 21 MG/24HR TD PT24
21.0000 mg | MEDICATED_PATCH | Freq: Every day | TRANSDERMAL | Status: DC
  Administered 2021-09-28 – 2021-10-02 (×5): 21 mg via TRANSDERMAL
  Filled 2021-09-28 (×5): qty 1

## 2021-09-28 MED ORDER — METOPROLOL TARTRATE 25 MG PO TABS
25.0000 mg | ORAL_TABLET | Freq: Two times a day (BID) | ORAL | Status: DC
  Administered 2021-09-28 – 2021-09-30 (×4): 25 mg via ORAL
  Filled 2021-09-28 (×4): qty 1

## 2021-09-28 NOTE — Progress Notes (Signed)
  Transition of Care [facility]) Screening Note   Patient Details  Name: [name] Date of Birth: 1955-12-23   Transition of Care [facility]) CM/SW Contact:    Delilah Shan, LCSWA Phone Number: 09/28/2021, 5:10 PM    Transition of Care Department [facility]) has reviewed patient and no TOC needs have been identified at this time. We will continue to monitor patient advancement through interdisciplinary progression rounds. If new patient transition needs arise, please place a TOC consult.

## 2021-09-28 NOTE — Progress Notes (Signed)
Inpatient Diabetes Program Recommendations  AACE/ADA: New Consensus Statement on Inpatient Glycemic Control (2015)  Target Ranges:  Prepandial:   less than 140 mg/dL      Peak postprandial:   less than 180 mg/dL (1-2 hours)      Critically ill patients:  140 - 180 mg/dL   Lab Results  Component Value Date   GLUCAP 167 (H) 09/28/2021   HGBA1C 6.7 (H) 09/17/2021    Current orders for Inpatient glycemic control: Novolog 5 units Q4H, Novolog 0-15 units Q4H Solumedrol 40 mg QD  Inpatient Diabetes Program Recommendations:    Noted mild hypoglycemia; appears correction and tube feed coverage given >1 hr post previous CBG and that schedule was delayed.  Would consider reducing correction to Novolog 0-9 units q4H.   Thanks, Lujean Rave, MSN, RNC-OB Diabetes Coordinator [phone] (8a-5p)

## 2021-09-28 NOTE — Evaluation (Signed)
Clinical/Bedside Swallow Evaluation Patient Details  Name: [name] MRN: [id number] Date of Birth: 02-Oct-1956  Today's Date: 09/28/2021 Time: SLP Start Time (ACUTE ONLY): 0912 SLP Stop Time (ACUTE ONLY): 0933 SLP Time Calculation (min) (ACUTE ONLY): 21 min  Past Medical History:  Past Medical History:  Diagnosis Date   Asthma    Benign essential HTN    COPD (chronic obstructive pulmonary disease) (HCC)    IBS (irritable bowel syndrome)    Migraine headache    Past Surgical History:  Past Surgical History:  Procedure Laterality Date   CORONARY/GRAFT ACUTE MI REVASCULARIZATION N/A 09/22/2021   Procedure: Coronary/Graft Acute MI Revascularization;  Surgeon: Yates Decamp, MD;  Location: [facility] INVASIVE CV LAB;  Service: Cardiovascular;  Laterality: N/A;   LEFT HEART CATH AND CORONARY ANGIOGRAPHY N/A 09/22/2021   Procedure: LEFT HEART CATH AND CORONARY ANGIOGRAPHY;  Surgeon: Yates Decamp, MD;  Location: [facility] INVASIVE CV LAB;  Service: Cardiovascular;  Laterality: N/A;   REVERSE SHOULDER ARTHROPLASTY Left 09/16/2021   Procedure: REVERSE SHOULDER ARTHROPLASTY;  Surgeon: Beverely Low, MD;  Location: [facility] ORS;  Service: Orthopedics;  Laterality: Left;   HPI:  Pt is a 65 yo female who presented to [facility] ED 12/7 after being found unresponsive. PT was intubated in the field and found to have STEMI, so was transferred to [facility] for PCI of RCA. She was extubated post-procedure but reintubated 12/7-12/11. PMH includes: CAD s/p CABG x 4, HTN, HLD, DM, COPD.    Assessment / Plan / Recommendation  Clinical Impression  Pt presents with clinical signs concerning for dysphagia that include upwards of 4-5 subswallows per single bolus and delayed cough noted infrequently with water. Her voice is dysphonic post-extubation and she could further be at risk for dysphagia given current mentation. She was on a NRB at baseline but was switched to 4L via [place] to attempt POs. SpO2 steadily dropped from 100% down to the low  90s across a short duration of time before being switched back to NRB, suggesitve of decreased respiratory status that could increase risk for decreased airway protection. For now would offer limited amounts of ice chips after oral care is completed. She already has Cortrak for alternative means of nutrition, but should she lose access for any reason, could consider providing essential medications crushed in small amounts of applesauce. Otherwise, would continue to use alternative means until further assessment from SLP. SLP Visit Diagnosis: Dysphagia, unspecified (R13.10)    Aspiration Risk  Moderate aspiration risk    Diet Recommendation NPO;Alternative means - temporary;Ice chips PRN after oral care (few ice chips at a time)   Medication Administration: Via alternative means    Other  Recommendations Oral Care Recommendations: Oral care QID Other Recommendations: Have oral suction available    Recommendations for follow up therapy are one component of a multi-disciplinary discharge planning process, led by the attending physician.  Recommendations may be updated based on patient status, additional functional criteria and insurance authorization.  Follow up Recommendations  (tba)      Assistance Recommended at Discharge Intermittent Supervision/Assistance  Functional Status Assessment Patient has had a recent decline in their functional status and demonstrates the ability to make significant improvements in function in a reasonable and predictable amount of time.  Frequency and Duration min 2x/week  2 weeks       Prognosis Prognosis for Safe Diet Advancement: Good      Swallow Study   General HPI: Pt is a 65 yo female who presented to [facility] ED  12/7 after being found unresponsive. PT was intubated in the field and found to have STEMI, so was transferred to [facility] for PCI of RCA. She was extubated post-procedure but reintubated 12/7-12/11. PMH includes: CAD s/p CABG x 4, HTN, HLD, DM,  COPD. Type of Study: Bedside Swallow Evaluation Previous Swallow Assessment: none in chart Diet Prior to this Study: NPO;NG Tube Temperature Spikes Noted: No Respiratory Status: Nasal cannula (switched from NRB for eval) History of Recent Intubation: Yes Length of Intubations (days): 5 days (across two intubations) Date extubated: 09/27/21 Behavior/Cognition: Alert;Cooperative;Confused;Requires cueing Oral Cavity Assessment: Dry Oral Care Completed by SLP: No Oral Cavity - Dentition: Missing dentition Self-Feeding Abilities: Needs assist Patient Positioning: Upright in bed Baseline Vocal Quality: Hoarse;Low vocal intensity Volitional Swallow: Able to elicit    Oral/Motor/Sensory Function Overall Oral Motor/Sensory Function: Generalized oral weakness   Ice Chips Ice chips: Impaired Presentation: Spoon Pharyngeal Phase Impairments: Multiple swallows   Thin Liquid Thin Liquid: Impaired Presentation: Spoon;Cup;Straw Pharyngeal  Phase Impairments: Multiple swallows;Cough - Delayed    Nectar Thick Nectar Thick Liquid: Not tested   Honey Thick Honey Thick Liquid: Not tested   Puree Puree: Impaired Presentation: Spoon Pharyngeal Phase Impairments: Multiple swallows   Solid     Solid: Not tested       Mahala Menghini., M.A. CCC-SLP Acute Rehabilitation Services Pager [phone] Office [phone]  09/28/2021,10:26 AM

## 2021-09-28 NOTE — Progress Notes (Signed)
Subjective:  Patient is now extubated.  Blood pressure has improved and patient is tachycardic.  Still has significant wheezing and dyspnea.  No chest pain.  Intake/Output from previous day:  I/O last 3 completed shifts: In: 2137.9 [I.V.:682.9; NG/GT:1305; IV Piggyback:150] Out: 1884 [Urine:3765; Emesis/NG output:200] Total I/O In: 738.1 [I.V.:677.7; IV Piggyback:60.4] Out: 1600 [Urine:1600]  Blood pressure (!) 93/56, pulse (!) 161, temperature 97.7 F (36.5 C), temperature source Oral, resp. rate 15, weight 57.6 kg, SpO2 100 %. Physical Exam Vitals reviewed.  HENT:     Head: Normocephalic and atraumatic.     Comments: ETT in place. Cardiovascular:     Rate and Rhythm: Normal rate and regular rhythm.     Pulses: Intact distal pulses.          Popliteal pulses are 0 on the right side and 0 on the left side.       Dorsalis pedis pulses are 0 on the right side and 0 on the left side.       Posterior tibial pulses are 0 on the right side and 0 on the left side.     Heart sounds: S1 normal and S2 normal. Heart sounds are distant. No murmur heard.   No gallop.  Pulmonary:     Effort: Pulmonary effort is normal. No respiratory distress.     Breath sounds: Wheezing (bilaterally) present.  Musculoskeletal:     Right lower leg: No edema.     Left lower leg: No edema.     Comments: Left arm edema  Skin:    General: Skin is warm and dry.     Capillary Refill: Capillary refill takes less than 2 seconds.  Neurological:     Mental Status: She is alert and oriented to person, place, and time.    Lab Results: BMP BNP (last 3 results) Recent Labs    09/23/21 0613 09/24/21 0221 09/25/21 0241  BNP 1,323.2* 1,495.3* 1,291.5*    ProBNP (last 3 results) No results for input(s): PROBNP in the last 8760 hours. BMP Latest Ref Rng & Units 09/28/2021 09/27/2021 09/26/2021  Glucose 70 - 99 mg/dL 189(H) 172(H) 170(H)  BUN 8 - 23 mg/dL 31(H) 34(H) 25(H)  Creatinine 0.44 - 1.00 mg/dL 0.90  0.96 0.94  Sodium 135 - 145 mmol/L 140 138 135  Potassium 3.5 - 5.1 mmol/L 3.7 3.9 4.1  Chloride 98 - 111 mmol/L 92(L) 88(L) 87(L)  CO2 22 - 32 mmol/L 41(H) 43(H) 41(H)  Calcium 8.9 - 10.3 mg/dL 8.4(L) 8.1(L) 7.8(L)   Hepatic Function Latest Ref Rng & Units 09/24/2021 03/29/2020 03/22/2020  Total Protein 6.5 - 8.1 g/dL 6.0(L) 6.8 8.0  Albumin 3.5 - 5.0 g/dL 2.7(L) 3.7 4.3  AST 15 - 41 U/L 53(H) 27 40  ALT 0 - 44 U/L 37 21 18  Alk Phosphatase 38 - 126 U/L 87 98 121  Total Bilirubin 0.3 - 1.2 mg/dL 0.6 1.0 0.6  Bilirubin, Direct 0.0 - 0.3 mg/dL - - -   CBC Latest Ref Rng & Units 09/26/2021 09/25/2021 09/24/2021  WBC 4.0 - 10.5 K/uL 21.7(H) 30.7(H) 23.3(H)  Hemoglobin 12.0 - 15.0 g/dL 8.1(L) 8.5(L) 8.7(L)  Hematocrit 36.0 - 46.0 % 25.5(L) 27.7(L) 27.8(L)  Platelets 150 - 400 K/uL 410(H) 456(H) 331   Lipid Panel     Component Value Date/Time   CHOL 161 09/24/2021 0221   TRIG 121 09/24/2021 0221   HDL 27 (L) 09/24/2021 0221   CHOLHDL 6.0 09/24/2021 0221   VLDL 24 09/24/2021 0221  LDLCALC 110 (H) 09/24/2021 0221   LDLDIRECT 173.8 10/24/2009 1152   Cardiac Panel (last 3 results) No results for input(s): CKTOTAL, CKMB, TROPONINI, RELINDX in the last 72 hours.  HEMOGLOBIN A1C Lab Results  Component Value Date   HGBA1C 6.7 (H) 09/17/2021   MPG 146 09/17/2021   TSH Recent Labs    09/24/21 0221  TSH 0.177*   BNP (last 3 results) Recent Labs    09/23/21 0613 09/24/21 0221 09/25/21 0241  BNP 1,323.2* 1,495.3* 1,291.5*    ProBNP (last 3 results) No results for input(s): PROBNP in the last 8760 hours.  Imaging: Chest x-ray 1 view 09/25/2021: Status post coronary bypass graft. Stable cardiomediastinal silhouette. Endotracheal and feeding tubes are in good position. Status post left shoulder arthroplasty. Mild bibasilar atelectasis is noted  Cardiac Studies:  Echocardiogram 09/23/2021: 1. Left ventricular ejection fraction, by estimation, is 45 to 50%. Left  ventricular  ejection fraction by PLAX is 54 %. The left ventricle has  normal function. The left ventricle demonstrates regional wall motion  abnormalities  Left ventricular diastolic parameters  are consistent with Grade III diastolic dysfunction (restrictive).  Elevated left ventricular end-diastolic pressure. There is mild  hypokinesis of the left ventricular, entire  inferoseptal wall, inferior wall and inferolateral wall.   2. Right ventricular systolic function is normal. The right ventricular  size is normal. There is moderately elevated pulmonary artery systolic pressure. The estimated right ventricular systolic pressure is 50.3 mmHg.   3. Left atrial size was mildly dilated.   4. Moderate to at most moderately severe posteriorly directed MR.  Consider papillary muscle dysfunction. The mitral valve is normal in  structure. Moderate to severe mitral valve regurgitation. No evidence of  mitral stenosis.   5. The aortic valve is normal in structure. Aortic valve regurgitation is not visualized. No aortic stenosis is present.   6. The inferior vena cava is dilated in size with <50% respiratory  variability, suggesting right atrial pressure of 15 mmHg.   Left Heart Catheterization 09/23/21:  RCA diffuse in the proximal and mid RCA with ulceration and scattered 80-99% stenosis.  Successful PCI to ulcerated high grade native RCA with implantation of 3.0x48 mm Synergy SD in the proximal to mid RCA. Timi 3 to 3 flow. LM diffuse 80% stenosis.  LAD patent with mild disease and LIMA to LAD patent.  CX: Moderate OM1 widely patent. Cx severely diseased after origin of OM 1 with 90-95% stenosis.  Small diseased OM-2 RI: Small with ostial mild disease.  SVG to OM1-2 skip graft occluded, SVG to RCA occluded.  LV: EF 35-40% with inferior akinesis. No MR.  Hemodynamics: LV 112/18, EDP 25 mm Hg. Ao 114/80, mean 112 mm Hg. No pressure gradient across the AV.  90 mL contrast used  Echocardiogram 03/24/2020:     1. Left  ventricular ejection fraction, by estimation, is 55 to 60%. The  left ventricle has normal function. The left ventricle has no regional  wall motion abnormalities. There is moderate left ventricular hypertrophy.  Left ventricular diastolic parameters are consistent with Grade I diastolic dysfunction (impaired relaxation).   2. Right ventricular systolic function is normal. The right ventricular  size is normal. Tricuspid regurgitation signal is inadequate for assessing PA pressure.   3. The mitral valve is normal in structure. No evidence of mitral valve regurgitation. No evidence of mitral stenosis.   4. The aortic valve is normal in structure. Aortic valve regurgitation is not visualized. No aortic stenosis is present.  5. The inferior vena cava is normal in size with greater than 50%  respiratory variability, suggesting right atrial pressure of 3 mmHg.   CABG 04/29/2016: LIMA to LAD, SVG to RCA, SVG to OM1, SVG to OM2 for left main 90% stenosis 50% stenosis in circumflex and 60% stenosis in OM1, 50% stenosis in proximal RCA.  EKG 09/22/21 at 22:59 hours: Acute injury pattern in the inferior leads with reciprocal ST depression in lateral leads. Underlying Mobitz I AV Block.     03/29/2020: Sinus tachycardia at rate of 106 bpm, poor R wave progression, probably normal variant.  No evidence of ischemia.   Scheduled Meds:  arformoterol  15 mcg Nebulization BID   artificial tears  1 application Both Eyes W3U   aspirin  81 mg Per Tube Daily   atorvastatin  80 mg Per Tube Daily   budesonide (PULMICORT) nebulizer solution  0.25 mg Nebulization BID   chlorhexidine gluconate (MEDLINE KIT)  15 mL Mouth Rinse BID   Chlorhexidine Gluconate Cloth  6 each Topical Daily   docusate  100 mg Per Tube BID   furosemide  40 mg Intravenous BID   heparin  5,000 Units Subcutaneous Q8H   insulin aspart  0-15 Units Subcutaneous Q4H   insulin aspart  5 Units Subcutaneous Q4H   ipratropium-albuterol  3 mL  Nebulization Q6H   mouth rinse  15 mL Mouth Rinse 10 times per day   methylPREDNISolone (SOLU-MEDROL) injection  40 mg Intravenous Q24H   nicotine  21 mg Transdermal Daily   oxyCODONE  5 mg Per Tube Q8H   pantoprazole sodium  40 mg Per Tube Daily   polyethylene glycol  17 g Per Tube Daily   QUEtiapine  25 mg Per Tube BID   revefenacin  175 mcg Nebulization Daily   sodium chloride flush  10-40 mL Intracatheter Q12H   sodium chloride flush  3 mL Intravenous Q12H   ticagrelor  90 mg Per Tube BID   Continuous Infusions:  sodium chloride     sodium chloride Stopped (09/23/21 1753)   sodium chloride     dexmedetomidine (PRECEDEX) IV infusion 1.02 mcg/kg/hr (09/28/21 1538)   feeding supplement (VITAL AF 1.2 CAL) 1,000 mL (09/28/21 0212)   PRN Meds:.sodium chloride, Place/Maintain arterial line **AND** sodium chloride, acetaminophen, fentaNYL (SUBLIMAZE) injection, haloperidol lactate, ondansetron (ZOFRAN) IV, oxyCODONE-acetaminophen **AND** oxyCODONE, promethazine, sodium chloride flush, sodium chloride flush  Assessment/Plan:  Inferior lateral STEMI status post RCA stenting Recommend uninterrupted dual antiplatelet therapy with Aspirin $RemoveBefo'81mg'nraFalfkmus$  daily and Ticagrelor $RemoveBefore'90mg'vgYgSLYerWXux$  twice daily for a minimum of 12 months  Continue statin therapy, atorvastatin 80 mg daily.  We will start her on low-dose beta-blocker therapy both for tachycardia and underlying CAD.   Respiratory failure/COPD exacerbation: Predominately COPD exacerbation  Will defer primary management to PCCM at this time as respiratory failure is primarily due to COPD and PCCM .  Acute diastolic heart failure secondary to recent MI and mitral regurgitation, now resolved.  Patient appears dry, will discontinue furosemide.  Mitral regurgitation: Moderate to moderately severe mitral regurgitation is due to papillary muscle dysfunction in view of recent right coronary artery involvement.   Do not suspect mechanical complications.  Today I  cannot hear the murmur in view of probably abnormal lung sounds.  But does not appear to be clinically volume overloaded or in acute decompensated heart failure now.   Abnormal TSH however free T4 is normal, free T3 is reduced, suspect sick euthyroid syndrome.   Adrian Prows, MD, [facility] 09/28/2021,  4:48 PM Office: [phone] Fax: [phone] Pager: [phone]

## 2021-09-28 NOTE — Progress Notes (Signed)
Patient continues to be restless and agitated pulling at BiPap and lines, swinging arms at staff and attempting to get out of bed. Patient safety is maintained and multiple attempts to redirect patient and to let her know she is safe and in the hospital by staff

## 2021-09-28 NOTE — Progress Notes (Signed)
Patient continues to remove Bipap mask and tubing. Ordered sedation medications reduced to attempt to allow patient to become more alert. NRB placed on patient RT notified.

## 2021-09-28 NOTE — Progress Notes (Addendum)
NAME:  [name], MRN:  [id number], DOB:  January 02, 1956, LOS: 5 ADMISSION DATE:  09/23/2021, CONSULTATION DATE:  09/23/21 REFERRING MD:  Jacinto Halim CHIEF COMPLAINT:  STEMI   History of Present Illness:  Pt is encephelopathic; therefore, this HPI is obtained from chart review. [name] is a 65 y.o. female who has a PMH as outlined below including CAD s/p CABG x 4, HTN, HLD, DM, COPD.  She presented to [facility] ED 12/7 after husband found at her at home unresponsive.  EMS was called and she was intubated in the field prior to transport to ED where she was found to have inferolateral STEMI.  She was subsequently transferred to [facility] for emergent PCI.  She was taken to cath lab and had stenting of the RCA (procedure note pending).  She then was transferred to the CVICU where PCCM asked to see in consultation. Upon our arrival, she is awake, following all commands on vent.  She has been weaning on PSV 5/5 and tolerating well.  Pertinent  Medical History:  has CLOSTRIDIUM DIFFICILE COLITIS; Hyperlipidemia; ANXIETY; ANXIETY DEPRESSION; TOBACCO ABUSE; Migraine; PERIPHERAL NEUROPATHY; ENDOTHELIAL CORNEAL DYSTROPHY; DISEASE, HYPERTENSIVE HEART NOS, W/O HF; PERIPHERAL VASCULAR DISEASE; DYSPLASTIC NEVUS, BACK; ASTHMA; GERD; DIVERTICULOSIS, COLON; IRRITABLE BOWEL SYNDROME; ACUTE CYSTITIS; MICROSCOPIC HEMATURIA; BREAST MASS; SEBACEOUS CYST; DEGENERATIVE DISC DISEASE, LUMBAR SPINE; BACK PAIN, LUMBAR; BACK PAIN, CHRONIC; WEIGHT LOSS; OTHER DYSPHAGIA; ABDOMINAL PAIN -GENERALIZED; GASTROINTESTINAL XRAY, ABNORMAL; CONTUSION, HIP, RIGHT; COPD exacerbation (HCC); Acute respiratory failure with hypoxia (HCC); 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HCC); Essential hypertension; Chronic pain syndrome; Overweight (BMI 25.0-29.9); Coronary artery disease involving native coronary artery of native heart without angina pectoris; Palpitations; Renal artery stenosis, non-flow-limiting (HCC); Hx of CABG; Asthma exacerbation; Anaphylactic  reaction; Hypertensive urgency; Paroxysmal tachycardia, unspecified (HCC); Acute diverticulitis; COPD with chronic bronchitis (HCC); Sepsis (HCC); Diverticulitis; H/O total shoulder replacement, left; Acute ST elevation myocardial infarction (STEMI) of inferior wall (HCC); and Acute ST elevation myocardial infarction (STEMI) involving other coronary artery of inferior wall (HCC) on their problem list.  Significant Hospital Events: Including procedures, antibiotic start and stop dates in addition to other pertinent events   12/7 admitted with inferolateral STEMI, taken for PCI of RCA, extubated, reintubated for respiratory failure 12/11 Extubated required precedex for overnight agitation  Interim History / Subjective:  Some desaturations overnight Coming down on versed for precedex  Objective:  Blood pressure 114/69, pulse (!) 114, temperature 98.9 F (37.2 C), temperature source Oral, resp. rate (!) 25, weight 57.6 kg, SpO2 100 %.    Vent Mode: BIPAP FiO2 (%):  [40 %-100 %] 100 % Set Rate:  [10 bmp] 10 bmp PEEP:  [6 cmH20-8 cmH20] 6 cmH20 Pressure Support:  [10 cmH20] 10 cmH20   Intake/Output Summary (Last 24 hours) at 09/28/2021 0836 Last data filed at 09/28/2021 0630 Gross per 24 hour  Intake 959.45 ml  Output 2590 ml  Net -1630.55 ml    Filed Weights   09/25/21 0318 09/26/21 0530 09/28/21 0417  Weight: 58.8 kg 59.3 kg 57.6 kg    Examination: Gen:      Intubated, sedated, acutely ill appearing HEENT:  ETT to vent Lungs:    sounds of mechanical ventilation auscultated, diminished bilaterally decreased air entry, faint wheezing, + tracheal tug.  CV:         Tachycardic, regular Abd:      + bowel sounds; soft, non-tender; no palpable masses, no distension Ext:    No edema Skin:      Warm and dry; no rashes  Neuro:   sedated, RASS -1 to +1, moves all 4 extremities    Labs/imaging personally reviewed:  Na 138 Cr 0.96, CO2 43 CBG 125  Assessment & Plan:   Critically ill  due to acute hyperactive delirium requiring Precedex titration.  Acute on chronic respiratory failure with hypoxia and hypercapnia NIV at night time Acute COPD exacerbation  Acute pulmonary edema LLL pneumonia- Inferolateral STEMI - s/p DES of RCA. Acute HFrEF, G3DD Moderate to severe MR-- concern for papillary muscle dysfunction 2/2 ischemia Chronic anemia-stable Leukocytosis, suspect reactive, but has been chronically elevated Hyperglycemia, diabetes (probably new diagnosis) A1c 6.7 Low TSH-- likely euthyroid sick syndrome with normal freeT4 Recent total shoulder arthroplasty on the left   Plan  - Start enteral antipsychotic - Continue bronchodilators. Compete course of steroids.  - Diurese today per cardiology.  - Continue current insulin regimen.  - Secondary prevention - ASA ticagrelor atorvastatin, start low-dose beta-blocker.   Best practice (evaluated daily):  Diet: TF Lines: PICC line to be placed Foley: yes Code status: full Family updates: no family at bedside today; husband last updated 12/8. Will update again today.   Labs   CBC: Recent Labs  Lab 09/23/21 0157 09/23/21 [id number] 09/23/21 1132 09/23/21 1304 09/23/21 2341 09/24/21 0221 09/25/21 0241 09/26/21 0356  WBC 24.3*  --   --   --   --  23.3* 30.7* 21.7*  HGB 8.8*  --    < > 10.5* 9.5* 8.7* 8.5* 8.1*  HCT 28.2*  --    < > 31.0* 28.0* 27.8* 27.7* 25.5*  MCV 88.7  --   --   --   --  90.6 90.2 90.1  PLT 413* 402*  --   --   --  331 456* 410*   < > = values in this interval not displayed.     Basic Metabolic Panel: Recent Labs  Lab 09/24/21 0221 09/24/21 1544 09/25/21 0241 09/25/21 0630 09/26/21 0356 09/27/21 0356 09/28/21 0428  NA 132* 133*  --  134* 135 138 140  K 4.1 3.5  --  4.0 4.1 3.9 3.7  CL 95* 93*  --  93* 87* 88* 92*  CO2 26 30  --  30 41* 43* 41*  GLUCOSE 248* 181*  --  156* 170* 172* 189*  BUN 20 24*  --  23 25* 34* 31*  CREATININE 1.29* 1.08*  --  0.96 0.94 0.96 0.90  CALCIUM  8.1* 7.9*  --  7.9* 7.8* 8.1* 8.4*  MG 3.0* 2.4 2.2  --  2.5* 2.3  --   PHOS 4.0  --  2.4*  --  3.0 3.5  --     The patient is critically ill due to respiratory failure.  Critical care was necessary to treat or prevent imminent or life-threatening deterioration.  Critical care was time spent personally by me on the following activities: development of treatment plan with patient and/or surrogate as well as nursing, discussions with consultants, evaluation of patient's response to treatment, examination of patient, obtaining history from patient or surrogate, ordering and performing treatments and interventions, ordering and review of laboratory studies, ordering and review of radiographic studies, pulse oximetry, re-evaluation of patient's condition and participation in multidisciplinary rounds.   Critical Care Time devoted to patient care services described in this note is 35 minutes. This time reflects time of care of this [place] . This critical care time does not reflect separately billable procedures or procedure time, teaching time or supervisory time of  PA/NP/Med student/Med Resident etc but could involve care discussion time.       Kipp Brood [facility] 09/28/2021 8:36 AM  Pager: see AMION  If no response to pager , please call critical care on call (see AMION) until 7pm After 7:00 pm call Elink

## 2021-09-29 DIAGNOSIS — J9601 Acute respiratory failure with hypoxia: Secondary | ICD-10-CM | POA: Diagnosis not present

## 2021-09-29 DIAGNOSIS — J418 Mixed simple and mucopurulent chronic bronchitis: Secondary | ICD-10-CM | POA: Diagnosis not present

## 2021-09-29 DIAGNOSIS — I5031 Acute diastolic (congestive) heart failure: Secondary | ICD-10-CM

## 2021-09-29 DIAGNOSIS — I340 Nonrheumatic mitral (valve) insufficiency: Secondary | ICD-10-CM | POA: Diagnosis not present

## 2021-09-29 DIAGNOSIS — I25110 Atherosclerotic heart disease of native coronary artery with unstable angina pectoris: Secondary | ICD-10-CM | POA: Diagnosis not present

## 2021-09-29 DIAGNOSIS — R0902 Hypoxemia: Secondary | ICD-10-CM

## 2021-09-29 LAB — BASIC METABOLIC PANEL
Anion gap: 7 (ref 5–15)
BUN: 37 mg/dL — ABNORMAL HIGH (ref 8–23)
CO2: 38 mmol/L — ABNORMAL HIGH (ref 22–32)
Calcium: 8.7 mg/dL — ABNORMAL LOW (ref 8.9–10.3)
Chloride: 95 mmol/L — ABNORMAL LOW (ref 98–111)
Creatinine, Ser: 0.96 mg/dL (ref 0.44–1.00)
GFR, Estimated: >60 mL/min (ref >60)
Glucose, Bld: 144 mg/dL — ABNORMAL HIGH (ref 70–99)
Potassium: 3.8 mmol/L (ref 3.5–5.1)
Sodium: 140 mmol/L (ref 135–145)

## 2021-09-29 LAB — GLUCOSE, CAPILLARY
Glucose-Capillary: 137 mg/dL — ABNORMAL HIGH (ref 70–99)
Glucose-Capillary: 191 mg/dL — ABNORMAL HIGH (ref 70–99)
Glucose-Capillary: 98 mg/dL (ref 70–99)
Glucose-Capillary: 204 mg/dL — ABNORMAL HIGH (ref 70–99)
Glucose-Capillary: 125 mg/dL — ABNORMAL HIGH (ref 70–99)

## 2021-09-29 LAB — MAGNESIUM: Magnesium: 2.3 mg/dL (ref 1.7–2.4)

## 2021-09-29 MED ORDER — INSULIN ASPART 100 UNIT/ML IJ SOLN
0.0000 [IU] | Freq: Three times a day (TID) | INTRAMUSCULAR | Status: DC
  Administered 2021-09-29: 5 [IU] via SUBCUTANEOUS
  Administered 2021-10-02: 2 [IU] via SUBCUTANEOUS

## 2021-09-29 MED ORDER — DAPAGLIFLOZIN PROPANEDIOL 10 MG PO TABS
10.0000 mg | ORAL_TABLET | Freq: Every day | ORAL | Status: DC
  Administered 2021-09-29 – 2021-10-02 (×4): 10 mg via ORAL
  Filled 2021-09-29 (×5): qty 1

## 2021-09-29 MED ORDER — ENSURE ENLIVE PO LIQD
237.0000 mL | Freq: Two times a day (BID) | ORAL | Status: DC
  Administered 2021-09-29 – 2021-10-01 (×2): 237 mL via ORAL

## 2021-09-29 MED ORDER — INSULIN ASPART 100 UNIT/ML IJ SOLN
5.0000 [IU] | Freq: Three times a day (TID) | INTRAMUSCULAR | Status: DC
  Administered 2021-09-29 – 2021-10-02 (×3): 5 [IU] via SUBCUTANEOUS

## 2021-09-29 MED ORDER — OXYCODONE HCL 5 MG PO TABS
5.0000 mg | ORAL_TABLET | Freq: Three times a day (TID) | ORAL | Status: DC
  Administered 2021-09-29 – 2021-10-01 (×5): 5 mg via ORAL
  Filled 2021-09-29 (×6): qty 1

## 2021-09-29 MED ORDER — ORAL CARE MOUTH RINSE
15.0000 mL | Freq: Two times a day (BID) | OROMUCOSAL | Status: DC
  Administered 2021-09-29 – 2021-10-02 (×7): 15 mL via OROMUCOSAL

## 2021-09-29 NOTE — Progress Notes (Signed)
Speech Language Pathology Treatment: Dysphagia  Patient Details Name: [name] MRN: [id number] DOB: Jan 15, 1956 Today's Date: 09/29/2021 Time: 1008-1030 SLP Time Calculation (min) (ACUTE ONLY): 22 min  Assessment / Plan / Recommendation Clinical Impression  Pt's mentation is improved today and although her voice has improved as well, she does remain dysphonic. She has less signs of dysphagia while consuming various textures, but does have c/o the graham cracker being too dry and she has occasional, delayed throat clearing. When challenged with 3 ounces of water via consecutive drinking, she does not have any overt coughing though. Recommend starting Dys 2 diet and thin liquids with careful monitoring and use of aspiration precautions. Will f/u for ongoing tx, still considering instrumental testing if any signs of dysphagia and/or aspiration persist.    HPI HPI: Pt is a 65 yo female who presented to [facility] ED 12/7 after being found unresponsive. PT was intubated in the field and found to have STEMI, so was transferred to [facility] for PCI of RCA. She was extubated post-procedure but reintubated 12/7-12/11. PMH includes: CAD s/p CABG x 4, HTN, HLD, DM, COPD.      SLP Plan  Continue with current plan of care      Recommendations for follow up therapy are one component of a multi-disciplinary discharge planning process, led by the attending physician.  Recommendations may be updated based on patient status, additional functional criteria and insurance authorization.    Recommendations  Diet recommendations: Dysphagia 2 (fine chop);Thin liquid Liquids provided via: Cup;Straw Medication Administration: Crushed with puree Supervision: Patient able to self feed;Full supervision/cueing for compensatory strategies Compensations: Slow rate;Small sips/bites Postural Changes and/or Swallow Maneuvers: Seated upright 90 degrees                Oral Care Recommendations: Oral care BID Follow Up  Recommendations:  (tba) Assistance recommended at discharge: Intermittent Supervision/Assistance SLP Visit Diagnosis: Dysphagia, unspecified (R13.10) Plan: Continue with current plan of care       GO                Mahala Menghini., M.A. CCC-SLP Acute Rehabilitation Services Pager [phone] Office [phone]  09/29/2021, 12:00 PM

## 2021-09-29 NOTE — Progress Notes (Addendum)
NAME:  [name], MRN:  [id number], DOB:  [birth year], LOS: 6 ADMISSION DATE:  09/23/2021, CONSULTATION DATE:  09/23/21 REFERRING MD:  Einar Gip CHIEF COMPLAINT:  STEMI   History of Present Illness:  Pt is encephelopathic; therefore, this HPI is obtained from chart review. [name] is a 65 y.o. female who has a PMH as outlined below including CAD s/p CABG x 4, HTN, HLD, DM, COPD.  She presented to [facility] ED 12/7 after husband found at her at home unresponsive.  EMS was called and she was intubated in the field prior to transport to ED where she was found to have inferolateral STEMI.  She was subsequently transferred to [facility] for emergent PCI.  She was taken to cath lab and had stenting of the RCA (procedure note pending).  She then was transferred to the CVICU where PCCM asked to see in consultation. Upon our arrival, she is awake, following all commands on vent.  She has been weaning on PSV 5/5 and tolerating well.  Pertinent  Medical History:  has CLOSTRIDIUM DIFFICILE COLITIS; Hyperlipidemia; ANXIETY; ANXIETY DEPRESSION; TOBACCO ABUSE; Migraine; PERIPHERAL NEUROPATHY; ENDOTHELIAL CORNEAL DYSTROPHY; DISEASE, HYPERTENSIVE HEART NOS, W/O HF; PERIPHERAL VASCULAR DISEASE; DYSPLASTIC NEVUS, BACK; ASTHMA; GERD; DIVERTICULOSIS, COLON; IRRITABLE BOWEL SYNDROME; ACUTE CYSTITIS; MICROSCOPIC HEMATURIA; BREAST MASS; SEBACEOUS CYST; DEGENERATIVE DISC DISEASE, LUMBAR SPINE; BACK PAIN, LUMBAR; BACK PAIN, CHRONIC; WEIGHT LOSS; OTHER DYSPHAGIA; ABDOMINAL PAIN -GENERALIZED; GASTROINTESTINAL XRAY, ABNORMAL; CONTUSION, HIP, RIGHT; COPD exacerbation ([place]); Acute respiratory failure with hypoxia ([place]); 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[place]); Essential hypertension; Chronic pain syndrome; Overweight (BMI 25.0-29.9); Coronary artery disease involving native coronary artery of native heart without angina pectoris; Palpitations; Renal artery stenosis, non-flow-limiting ([place]); Hx of CABG; Asthma exacerbation; Anaphylactic  reaction; Hypertensive urgency; Paroxysmal tachycardia, unspecified ([place]); Acute diverticulitis; COPD with chronic bronchitis ([place]); Sepsis ([place]); Diverticulitis; H/O total shoulder replacement, left; Acute ST elevation myocardial infarction (STEMI) of inferior wall (HCC); and Acute ST elevation myocardial infarction (STEMI) involving other coronary artery of inferior wall (HCC) on their problem list.  Significant Hospital Events: Including procedures, antibiotic start and stop dates in addition to other pertinent events   12/7 admitted with inferolateral STEMI, taken for PCI of RCA, extubated, reintubated for respiratory failure 12/11 Extubated required precedex for overnight agitation 12/12 agitation requiring Precedex and lorazepam  Interim History / Subjective:  Off Precedex this morning, no further agitation  Objective:  Blood pressure (!) 109/48, pulse 85, temperature 98.5 F (36.9 C), temperature source Oral, resp. rate 18, weight 56.9 kg, SpO2 95 %.    Vent Mode: BIPAP;Stand-by   Intake/Output Summary (Last 24 hours) at 09/29/2021 0855 Last data filed at 09/29/2021 0800 Gross per 24 hour  Intake 506.82 ml  Output 2200 ml  Net -1693.18 ml    Filed Weights   09/26/21 0530 09/28/21 0417 09/29/21 0454  Weight: 59.3 kg 57.6 kg 56.9 kg    Examination: Gen:      Calm on no infusions.  HEENT:  moist mucosae Lungs:    clear to auscultation, + tracheal tug.  CV:         Tachycardic, regular, normal heart sounds.  JVP at 8cm Abd:      + bowel sounds; soft, non-tender; no palpable masses, mild distension Ext:    No edema Skin:      Warm and dry; no rashes Neuro:   lethargic but calm and oriented. No focal deficits.    Labs/imaging personally reviewed:  Alkalosis with CO2 38  Assessment & Plan:   Was  Critically ill due to acute hyperactive delirium requiring Precedex titration.  Acute on chronic respiratory failure with hypoxia and hypercapnia NIV at night time Acute COPD  exacerbation  Acute pulmonary edema LLL pneumonia- Inferolateral STEMI - s/p DES of RCA. Acute HFrEF, G3DD Moderate to severe MR-- concern for papillary muscle dysfunction 2/2 ischemia Chronic anemia-stable Leukocytosis, suspect reactive, but has been chronically elevated Hyperglycemia, diabetes (probably new diagnosis) A1c 6.7 Low TSH-- likely euthyroid sick syndrome with normal freeT4 Recent total shoulder arthroplasty on the left   Plan  - Continue enteral antipsychotic for now - suspect may have been alcohol withdrawal, but husband reports patient doesn't drink.  - SLP  - Continue bronchodilators. Compete course of steroids.  Wean O2 as tolerated - No diuresis today as clinically euvolemic. Suspect mobilization will help oxygenation . - Continue NIV at bedtime for now and reassess long-term need closer to discharge. Needs pulmonary follow-up.  - Progressive ambulation.  - Continue current insulin regimen.  Added [place] for secondary prevention.  - Secondary prevention - ASA ticagrelor atorvastatin, increase beta-blocker.  - Ready for transfer, discussed with Dr Jacinto Halim, orders reconciled and TRH notified.   Best practice (evaluated daily):  Diet: SLP then advance diet accordingly and remove NGT.  Lines: PICC line to be placed Foley:  discontinue Code status: full Family updates:  husband updated today 12/13  35 min spent with > 50% time in counseling and coordination of care.     Lynnell Catalan [facility] 09/29/2021 8:55 AM  Pager: see AMION  If no response to pager , please call critical care on call (see AMION) until 7pm After 7:00 pm call Elink

## 2021-09-29 NOTE — Progress Notes (Signed)
Subjective:  Patient much more awake and alert, is having swallowing evaluation.  Husband present at the bedside. Intake/Output from previous day:  I/O last 3 completed shifts: In: 1002.8 [I.V.:942.4; IV Piggyback:60.4] Out: 5409 [[id number]] Total I/O In: 121.3 [I.V.:16.3; NG/GT:105] Out: 150 [Urine:150]  Blood pressure (!) 102/47, pulse 93, temperature 98.5 F (36.9 C), temperature source Oral, resp. rate 19, weight 56.9 kg, SpO2 95 %. Physical Exam Vitals reviewed.  Constitutional:      General: She is not in acute distress.    Appearance: She is ill-appearing.  HENT:     Head: Normocephalic.  Cardiovascular:     Rate and Rhythm: Regular rhythm. Tachycardia present.     Pulses: Intact distal pulses.          Popliteal pulses are 0 on the right side and 0 on the left side.       Dorsalis pedis pulses are 0 on the right side and 0 on the left side.       Posterior tibial pulses are 0 on the right side and 0 on the left side.     Heart sounds: S1 normal and S2 normal. Heart sounds are distant. No murmur heard.   No gallop.  Pulmonary:     Effort: Pulmonary effort is normal. No respiratory distress.     Breath sounds: Wheezing (bilaterally) present.  Musculoskeletal:     Right lower leg: No edema.     Left lower leg: No edema.     Comments: Left arm edema  Skin:    General: Skin is warm and dry.     Capillary Refill: Capillary refill takes less than 2 seconds.  Neurological:     Mental Status: She is alert and oriented to person, place, and time.    Lab Results: BMP BNP (last 3 results) Recent Labs    09/23/21 0613 09/24/21 0221 09/25/21 0241  BNP 1,323.2* 1,495.3* 1,291.5*     ProBNP (last 3 results) No results for input(s): PROBNP in the last 8760 hours. BMP Latest Ref Rng & Units 09/29/2021 09/28/2021 09/27/2021  Glucose 70 - 99 mg/dL 144(H) 189(H) 172(H)  BUN 8 - 23 mg/dL 37(H) 31(H) 34(H)  Creatinine 0.44 - 1.00 mg/dL 0.96 0.90 0.96  Sodium 135 - 145  mmol/L 140 140 138  Potassium 3.5 - 5.1 mmol/L 3.8 3.7 3.9  Chloride 98 - 111 mmol/L 95(L) 92(L) 88(L)  CO2 22 - 32 mmol/L 38(H) 41(H) 43(H)  Calcium 8.9 - 10.3 mg/dL 8.7(L) 8.4(L) 8.1(L)   Hepatic Function Latest Ref Rng & Units 09/24/2021 03/29/2020 03/22/2020  Total Protein 6.5 - 8.1 g/dL 6.0(L) 6.8 8.0  Albumin 3.5 - 5.0 g/dL 2.7(L) 3.7 4.3  AST 15 - 41 U/L 53(H) 27 40  ALT 0 - 44 U/L 37 21 18  Alk Phosphatase 38 - 126 U/L 87 98 121  Total Bilirubin 0.3 - 1.2 mg/dL 0.6 1.0 0.6  Bilirubin, Direct 0.0 - 0.3 mg/dL - - -   CBC Latest Ref Rng & Units 09/26/2021 09/25/2021 09/24/2021  WBC 4.0 - 10.5 K/uL 21.7(H) 30.7(H) 23.3(H)  Hemoglobin 12.0 - 15.0 g/dL 8.1(L) 8.5(L) 8.7(L)  Hematocrit 36.0 - 46.0 % 25.5(L) 27.7(L) 27.8(L)  Platelets 150 - 400 K/uL 410(H) 456(H) 331   Lipid Panel     Component Value Date/Time   CHOL 161 09/24/2021 0221   TRIG 121 09/24/2021 0221   HDL 27 (L) 09/24/2021 0221   CHOLHDL 6.0 09/24/2021 0221   VLDL 24 09/24/2021 0221   LDLCALC  110 (H) 09/24/2021 0221   LDLDIRECT 173.8 10/24/2009 1152   Cardiac Panel (last 3 results) No results for input(s): CKTOTAL, CKMB, TROPONINI, RELINDX in the last 72 hours.  HEMOGLOBIN A1C Lab Results  Component Value Date   HGBA1C 6.7 (H) 09/17/2021   MPG 146 09/17/2021   TSH Recent Labs    09/24/21 0221  TSH 0.177*    BNP (last 3 results) Recent Labs    09/23/21 0613 09/24/21 0221 09/25/21 0241  BNP 1,323.2* 1,495.3* 1,291.5*     ProBNP (last 3 results) No results for input(s): PROBNP in the last 8760 hours.  Imaging: Chest x-ray 1 view 09/25/2021: Status post coronary bypass graft. Stable cardiomediastinal silhouette. Endotracheal and feeding tubes are in good position. Status post left shoulder arthroplasty. Mild bibasilar atelectasis is noted  Cardiac Studies:  Echocardiogram 09/23/2021: 1. Left ventricular ejection fraction, by estimation, is 45 to 50%. Left  ventricular ejection fraction by PLAX  is 54 %. The left ventricle has  normal function. The left ventricle demonstrates regional wall motion  abnormalities  Left ventricular diastolic parameters  are consistent with Grade III diastolic dysfunction (restrictive).  Elevated left ventricular end-diastolic pressure. There is mild  hypokinesis of the left ventricular, entire  inferoseptal wall, inferior wall and inferolateral wall.   2. Right ventricular systolic function is normal. The right ventricular  size is normal. There is moderately elevated pulmonary artery systolic pressure. The estimated right ventricular systolic pressure is 39.5 mmHg.   3. Left atrial size was mildly dilated.   4. Moderate to at most moderately severe posteriorly directed MR.  Consider papillary muscle dysfunction. The mitral valve is normal in  structure. Moderate to severe mitral valve regurgitation. No evidence of  mitral stenosis.   5. The aortic valve is normal in structure. Aortic valve regurgitation is not visualized. No aortic stenosis is present.   6. The inferior vena cava is dilated in size with <50% respiratory  variability, suggesting right atrial pressure of 15 mmHg.   Left Heart Catheterization 09/23/21:  RCA diffuse in the proximal and mid RCA with ulceration and scattered 80-99% stenosis.  Successful PCI to ulcerated high grade native RCA with implantation of 3.0x48 mm Synergy SD in the proximal to mid RCA. Timi 3 to 3 flow. LM diffuse 80% stenosis.  LAD patent with mild disease and LIMA to LAD patent.  CX: Moderate OM1 widely patent. Cx severely diseased after origin of OM 1 with 90-95% stenosis.  Small diseased OM-2 RI: Small with ostial mild disease.  SVG to OM1-2 skip graft occluded, SVG to RCA occluded.  LV: EF 35-40% with inferior akinesis. No MR.  Hemodynamics: LV 112/18, EDP 25 mm Hg. Ao 114/80, mean 112 mm Hg. No pressure gradient across the AV.  90 mL contrast used  Echocardiogram 03/24/2020:     1. Left ventricular ejection  fraction, by estimation, is 55 to 60%. The  left ventricle has normal function. The left ventricle has no regional  wall motion abnormalities. There is moderate left ventricular hypertrophy.  Left ventricular diastolic parameters are consistent with Grade I diastolic dysfunction (impaired relaxation).   2. Right ventricular systolic function is normal. The right ventricular  size is normal. Tricuspid regurgitation signal is inadequate for assessing PA pressure.   3. The mitral valve is normal in structure. No evidence of mitral valve regurgitation. No evidence of mitral stenosis.   4. The aortic valve is normal in structure. Aortic valve regurgitation is not visualized. No aortic stenosis is present.  5. The inferior vena cava is normal in size with greater than 50%  respiratory variability, suggesting right atrial pressure of 3 mmHg.   CABG 04/29/2016: LIMA to LAD, SVG to RCA, SVG to OM1, SVG to OM2 for left main 90% stenosis 50% stenosis in circumflex and 60% stenosis in OM1, 50% stenosis in proximal RCA.  EKG 09/22/21 at 22:59 hours: Acute injury pattern in the inferior leads with reciprocal ST depression in lateral leads. Underlying [place] I AV Block.     03/29/2020: Sinus tachycardia at rate of 106 bpm, poor R wave progression, probably normal variant.  No evidence of ischemia.   Scheduled Meds:  arformoterol  15 mcg Nebulization BID   aspirin  81 mg Per Tube Daily   atorvastatin  80 mg Per Tube Daily   budesonide (PULMICORT) nebulizer solution  0.25 mg Nebulization BID   Chlorhexidine Gluconate Cloth  6 each Topical Daily   dapagliflozin propanediol  10 mg Oral Daily   heparin  5,000 Units Subcutaneous Q8H   insulin aspart  0-15 Units Subcutaneous TID WC   insulin aspart  5 Units Subcutaneous Q4H   ipratropium-albuterol  3 mL Nebulization Q6H   mouth rinse  15 mL Mouth Rinse BID   metoprolol tartrate  25 mg Oral BID   nicotine  21 mg Transdermal Daily   oxyCODONE  5 mg Per Tube  Q8H   pantoprazole sodium  40 mg Per Tube Daily   QUEtiapine  25 mg Per Tube BID   revefenacin  175 mcg Nebulization Daily   sodium chloride flush  10-40 mL Intracatheter Q12H   ticagrelor  90 mg Per Tube BID   Continuous Infusions:  sodium chloride Stopped (09/23/21 1753)   sodium chloride     feeding supplement (VITAL AF 1.2 CAL) 1,000 mL (09/28/21 2242)   PRN Meds:.Place/Maintain arterial line **AND** sodium chloride, acetaminophen, oxyCODONE-acetaminophen **AND** oxyCODONE, promethazine, sodium chloride flush  Assessment/Plan:  Inferior lateral STEMI status post RCA stenting Recommend uninterrupted dual antiplatelet therapy with Aspirin $RemoveBefo'81mg'oEAXdxHHKAc$  daily and Ticagrelor $RemoveBefore'90mg'[id number]$  twice daily for a minimum of 12 months  Continue statin therapy, atorvastatin 80 mg daily.   Tolerating metoprolol tartrate 25 mg twice daily.  Respiratory failure/COPD exacerbation: Predominately COPD exacerbation  Will defer primary management to PCCM at this time as respiratory failure is primarily due to COPD and PCCM .  Mitral regurgitation: Moderate to moderately severe mitral regurgitation is due to papillary muscle dysfunction in view of recent right coronary artery involvement.   Do not suspect mechanical complications.  Heart failure symptoms essentially resolved.  Not on ACE inhibitor's or ARB due to soft blood pressure.  Abnormal TSH however free T4 is normal, free T3 is reduced, suspect sick euthyroid syndrome.  From cardiac standpoint she is stable, will sign off for now, outpatient follow-up arrangements have been made.  Updated the condition with the patient's husband at the bedside.   Adrian Prows, MD, [facility] 09/29/2021, 11:47 AM Office: [phone] Fax: [phone] Pager: [phone]

## 2021-09-29 NOTE — Progress Notes (Signed)
Nutrition Follow-up  DOCUMENTATION CODES:   Not applicable  INTERVENTION:   Boost Breeze po TID, each supplement provides 250 kcal and 9 grams of protein vs Ensure Enlive po BID, each supplement provides 350 kcal and 20 grams of protein, pending pt preference  Recommend resuming bowel regimen if continues without BM  Hold TF at this time as RN indicating pt is eating and drinking well; monitor po intake If po intake inadequate, recommend supplementing with nocturnal TF If po intake remains adequate, ok to remove Cortrak  TF recommendations if need:  Vital AF 1.2 at 70 ml/hr x 12 hours Provides 1008 kcals, 63 g of protein and 680 mL of free water  NUTRITION DIAGNOSIS:   Inadequate oral intake related to acute illness as evidenced by NPO status.  Being addressed via diet advancement, supplements  GOAL:   Patient will meet greater than or equal to 90% of their needs  Progressing  MONITOR:   Vent status, Labs, Weight trends, TF tolerance  REASON FOR ASSESSMENT:   Consult, Ventilator Enteral/tube feeding initiation and management  ASSESSMENT:   65 yo female admitted with acute inferior STEMI, acute respiratory failure secondary to PMH includes COPD, IBS, HTN, DM, CKD 3  12/07 Admit with STEMI, Extubated, Re-Intubated, Cortrak placed 12/11 Extubated 12/13 Diet advanced to Dysphagia 2/Thins  Off precedex, pt to transfer out of ICU today  Per RN, pt ate most of her lunch today and drinking fluids well. Plan to order Boost breeze as RN indicating pt does not want Ensure as she is lactose intolerant (Ensure is suitable for Lactose intolerance but will cater to pt preferences)  Cortrak remains in place at this time but TF not infusing  No BM documented since admit; noted pt was receiving colace and miralax but that was d/c today. Pt is on scheduled pain regimen  Labs: CBG 98-345 Meds: ss novolog, novolog with meals  Diet Order:   Diet Order             DIET DYS 2  Room service appropriate? Yes; Fluid consistency: Thin  Diet effective now                   EDUCATION NEEDS:   Not appropriate for education at this time  Skin:  Skin Assessment: Reviewed RN Assessment  Last BM:  PTA, constipated with no BM x 6 days  Height:   Ht Readings from Last 1 Encounters:  09/16/21 5\' 1"  (1.549 m)    Weight:   Wt Readings from Last 1 Encounters:  09/29/21 56.9 kg    BMI:  Body mass index is 23.7 kg/m.  Estimated Nutritional Needs:   Kcal:  1550-1750 kcals  Protein:  80-90 g  Fluid:  >/= 1.5 L   10/01/21 MS, RDN, LDN, CNSC Registered Dietitian III Clinical Nutrition RD Pager and On-Call Pager Number Located in [place]

## 2021-09-29 NOTE — Progress Notes (Signed)
Patient arrived to room 6E10 in NAD, VS stable and patient free from pain. Patient oriented to room, call bell in reach and husband at bedside.

## 2021-09-29 NOTE — Progress Notes (Signed)
Patient refused BIPAP. Has not been wearing or compliant with it. Doing well in no distress at this time. Will continue to monitor.

## 2021-09-29 NOTE — TOC Initial Note (Signed)
Transition of Care [facility]) - Initial/Assessment Note    Patient Details  Name: [name] MRN: [id number] Date of Birth: 06-Feb-1956  Transition of Care [facility]) CM/SW Contact:    Gala Lewandowsky, RN Phone Number: 09/29/2021, 12:33 PM  Clinical Narrative:   Patient presented as a transfer from [facility]. Prior to arrival patient was from home with spouse. Case Manager will follow for NIV support for the home if needed. Patient will benefit from PT/OT consult once stable. Case Manager will continue to follow for additional transition of care needs.               Expected Discharge Plan: Home w Home Health Services Barriers to Discharge: Continued Medical Work up     Expected Discharge Plan and Services Expected Discharge Plan: Home w Home Health Services In-house Referral: Clinical Social Work Discharge Planning Services: CM Consult Post Acute Care Choice: Home Health          Need for Family Participation in Patient Care: Yes (Comment) Care giver support system in place?: Yes (comment)     Emotional Assessment Appearance:: Appears stated age       Alcohol / Substance Use: Not Applicable Psych Involvement: No (comment)  Admission diagnosis:  Acute ST elevation myocardial infarction (STEMI) involving other coronary artery of inferior wall (HCC) [I21.19] Patient Active Problem List   Diagnosis Date Noted   Acute diastolic heart failure (HCC)    Hypoxia    Acute ST elevation myocardial infarction (STEMI) involving other coronary artery of inferior wall (HCC) 09/23/2021   Acute ST elevation myocardial infarction (STEMI) of inferior wall (HCC) 09/22/2021   H/O total shoulder replacement, left 09/16/2021   Acute diverticulitis 03/30/2020   COPD with chronic bronchitis (HCC) 03/30/2020   Sepsis (HCC) 03/30/2020   Diverticulitis 03/30/2020   Paroxysmal tachycardia, unspecified (HCC)    Anaphylactic reaction 03/23/2020   Hypertensive urgency 03/23/2020    Asthma exacerbation 03/22/2020   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   Renal artery stenosis, non-flow-limiting (HCC) 06/14/2016   Coronary artery disease involving native coronary artery of native heart with unstable angina pectoris (HCC) 04/29/2016   Palpitations 04/22/2016   Overweight (BMI 25.0-29.9) 01/22/2015   COPD exacerbation (HCC) 01/19/2015   Acute respiratory failure with hypoxia (HCC) 01/19/2015   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   Essential hypertension 01/19/2015   Chronic pain syndrome 01/19/2015   ACUTE CYSTITIS 04/30/2010   GASTROINTESTINAL XRAY, ABNORMAL 01/05/2010   GERD 12/25/2009   BREAST MASS 10/24/2009   ANXIETY DEPRESSION 09/17/2009   WEIGHT LOSS 09/17/2009   ABDOMINAL PAIN -GENERALIZED 09/03/2009   CLOSTRIDIUM DIFFICILE COLITIS 06/16/2009   MICROSCOPIC HEMATURIA 06/09/2009   BACK PAIN, LUMBAR 06/09/2009   PERIPHERAL NEUROPATHY 05/28/2009   CONTUSION, HIP, RIGHT 05/28/2009   SEBACEOUS CYST 02/13/2009   BACK PAIN, CHRONIC 02/13/2009   DYSPLASTIC NEVUS, BACK 09/18/2008   ANXIETY 08/28/2008   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   DEGENERATIVE DISC DISEASE, 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   OTHER DYSPHAGIA 10/30/2007   IRRITABLE BOWEL SYNDROME 09/29/2007   TOBACCO ABUSE 07/17/2007   Migraine 07/17/2007   DISEASE, HYPERTENSIVE HEART NOS, W/O HF 05/25/2007   Hyperlipidemia 03/22/2007   PERIPHERAL VASCULAR DISEASE 03/22/2007   ASTHMA 03/22/2007   DIVERTICULOSIS, COLON 03/22/2007   PCP:  Patient, No Pcp Per (Inactive) Pharmacy:   [id number] - [place], [place] - [address] AT [address] [place] [address] Phone: [phone] Fax: [phone]   Readmission Risk Interventions No flowsheet data  found.

## 2021-09-30 ENCOUNTER — Telehealth: Payer: Self-pay | Admitting: Pulmonary Disease

## 2021-09-30 LAB — CBC WITH DIFFERENTIAL/PLATELET
Abs Immature Granulocytes: 0.16 10*3/uL — ABNORMAL HIGH (ref 0.00–0.07)
Basophils Absolute: 0 10*3/uL (ref 0.0–0.1)
Basophils Relative: 0 %
Eosinophils Absolute: 0.1 10*3/uL (ref 0.0–0.5)
Eosinophils Relative: 1 %
HCT: 27.8 % — ABNORMAL LOW (ref 36.0–46.0)
Hemoglobin: 8.5 g/dL — ABNORMAL LOW (ref 12.0–15.0)
Immature Granulocytes: 1 %
Lymphocytes Relative: 10 %
Lymphs Abs: 2.3 10*3/uL (ref 0.7–4.0)
MCH: 27.4 pg (ref 26.0–34.0)
MCHC: 30.6 g/dL (ref 30.0–36.0)
MCV: 89.7 fL (ref 80.0–100.0)
Monocytes Absolute: 1.7 10*3/uL — ABNORMAL HIGH (ref 0.1–1.0)
Monocytes Relative: 8 %
Neutro Abs: 17.8 10*3/uL — ABNORMAL HIGH (ref 1.7–7.7)
Neutrophils Relative %: 80 %
Platelets: 483 10*3/uL — ABNORMAL HIGH (ref 150–400)
RBC: 3.1 MIL/uL — ABNORMAL LOW (ref 3.87–5.11)
RDW: 15.4 % (ref 11.5–15.5)
WBC: 22 10*3/uL — ABNORMAL HIGH (ref 4.0–10.5)
nRBC: 0 % (ref 0.0–0.2)

## 2021-09-30 LAB — GLUCOSE, CAPILLARY
Glucose-Capillary: 109 mg/dL — ABNORMAL HIGH (ref 70–99)
Glucose-Capillary: 76 mg/dL (ref 70–99)
Glucose-Capillary: 76 mg/dL (ref 70–99)
Glucose-Capillary: 133 mg/dL — ABNORMAL HIGH (ref 70–99)

## 2021-09-30 LAB — PROCALCITONIN: Procalcitonin: <0.1 ng/mL

## 2021-09-30 MED ORDER — POLYETHYLENE GLYCOL 3350 17 G PO PACK
17.0000 g | PACK | Freq: Every day | ORAL | Status: DC
  Administered 2021-09-30: 18:00:00 17 g via ORAL
  Filled 2021-09-30 (×2): qty 1

## 2021-09-30 MED ORDER — ALBUTEROL SULFATE (2.5 MG/3ML) 0.083% IN NEBU
INHALATION_SOLUTION | RESPIRATORY_TRACT | Status: AC
  Administered 2021-09-30: 18:00:00 2.5 mg
  Filled 2021-09-30: qty 3

## 2021-09-30 MED ORDER — QUETIAPINE FUMARATE 25 MG PO TABS
25.0000 mg | ORAL_TABLET | Freq: Every day | ORAL | Status: DC
  Administered 2021-09-30: 23:00:00 25 mg via ORAL
  Filled 2021-09-30 (×2): qty 1

## 2021-09-30 MED ORDER — METOPROLOL TARTRATE 5 MG/5ML IV SOLN
5.0000 mg | Freq: Once | INTRAVENOUS | Status: AC
  Administered 2021-09-30: 23:00:00 5 mg via INTRAVENOUS
  Filled 2021-09-30: qty 5

## 2021-09-30 MED ORDER — PANTOPRAZOLE SODIUM 40 MG PO TBEC
40.0000 mg | DELAYED_RELEASE_TABLET | Freq: Every day | ORAL | Status: DC
  Administered 2021-09-30: 10:00:00 40 mg via ORAL
  Filled 2021-09-30: qty 1

## 2021-09-30 MED ORDER — QUETIAPINE FUMARATE 25 MG PO TABS: 25.0000 mg | ORAL_TABLET | Freq: Two times a day (BID) | ORAL | Status: DC

## 2021-09-30 MED ORDER — ASPIRIN 81 MG PO CHEW
81.0000 mg | CHEWABLE_TABLET | Freq: Every day | ORAL | Status: DC
  Administered 2021-10-01 – 2021-10-02 (×2): 81 mg via ORAL
  Filled 2021-09-30 (×2): qty 1

## 2021-09-30 MED ORDER — PANTOPRAZOLE SODIUM 40 MG PO TBEC
40.0000 mg | DELAYED_RELEASE_TABLET | Freq: Two times a day (BID) | ORAL | Status: DC
  Administered 2021-09-30 – 2021-10-02 (×4): 40 mg via ORAL
  Filled 2021-09-30 (×4): qty 1

## 2021-09-30 MED ORDER — TICAGRELOR 90 MG PO TABS
90.0000 mg | ORAL_TABLET | Freq: Two times a day (BID) | ORAL | Status: DC
  Administered 2021-09-30 – 2021-10-02 (×5): 90 mg via ORAL
  Filled 2021-09-30 (×5): qty 1

## 2021-09-30 MED ORDER — METOPROLOL TARTRATE 5 MG/5ML IV SOLN: 5.0000 mg | Freq: Once | INTRAVENOUS | Status: DC

## 2021-09-30 MED ORDER — ATORVASTATIN CALCIUM 80 MG PO TABS
80.0000 mg | ORAL_TABLET | Freq: Every day | ORAL | Status: DC
  Administered 2021-10-01 – 2021-10-02 (×2): 80 mg via ORAL
  Filled 2021-09-30 (×2): qty 1

## 2021-09-30 MED ORDER — METOPROLOL TARTRATE 5 MG/5ML IV SOLN
INTRAVENOUS | Status: AC
  Filled 2021-09-30: qty 5

## 2021-09-30 MED ORDER — METOPROLOL TARTRATE 5 MG/5ML IV SOLN
5.0000 mg | Freq: Once | INTRAVENOUS | Status: AC
  Administered 2021-09-30: 23:00:00 5 mg via INTRAVENOUS

## 2021-09-30 NOTE — Progress Notes (Addendum)
PROGRESS NOTE    [name]  [id number] DOB: 02-07-1956 DOA: 09/23/2021 PCP: Patient, No Pcp Per (Inactive)   Chief Complain: Unresponsive  Brief Narrative: Patient is a 65 female with history of C. difficile colitis, hyperlipidemia, anxiety/depression, tobacco use, peripheral vascular disease, COPD, hypertension, coronary artery disease who presented to [facility] ED on 12/11 after she was found to be unresponsive at home.  She was brought to the emergency department intubated by EMS.  She was found to have inferolateral STEMI and was transferred to [facility] for emergent PCI.  She was taken to Cath Lab and had stenting of RCA.  She was on PCCM service.  Transferred to our service today.  Currently on 2 L of oxygen per minute.  Important events:  12/7 admitted with inferolateral STEMI, taken for PCI of RCA, extubated, reintubated for respiratory failure 12/11 Extubated required precedex for overnight agitation 12/12 agitation requiring Precedex and lorazepam 12/13 extubated  12/14 tapering seroquel. Adding flutter and IS   Assessment & Plan:   Principal Problem:   Acute ST elevation myocardial infarction (STEMI) of inferior wall (HCC) Active Problems:   Coronary artery disease involving native coronary artery of native heart with unstable angina pectoris (HCC)   Acute ST elevation myocardial infarction (STEMI) involving other coronary artery of inferior wall (HCC)   Acute diastolic heart failure ([place])   Hypoxia   Acute STEMI: Found to be unresponsive.  Underwent urgent PCI to RCA.  Cardiology recommend outpatient follow-up.  Continue aspirin , Brilinta,lipitor, metoprolol  Acute on chronic hypoxic/hypercarbic respiratory failure due to left lower lobe pneumonia/acute COPD exacerbation: Had be intubated on presentation for airway protection.  PCCM following. Continue bronchodilators. Previous chest x-ray had some infiltrate on left mid and left lower lungs.  Treated with 5  days of antibiotics  Dysphagia: Speech therapy following.  Recommended dysphagia 2 diet.  See Feeding team, he will be taken out today, found to have significant reflux.On protonix.  Needs GI evaluation as an outpatient.  Delirium: Needed Precedex drip in ICU.  Monitor mental status.  On Seroquel.  Currently alert and oriented.  Moderate to severe mitral regurgitation: Concern for papillary muscle dysfunction secondary to ischemia.  Follow-up with cardiology as an outpatient.  Leukocytosis: Suspected to be reactive.  Chronically elevated.  Monitor  Diabetes type 2: Continue sliding scale insulin and NovoLog.  Hemoglobin A1c of 6.7. Started on farxiga  Low TSH: Likely euthyroid sick syndrome with normal free T4  Debility/deconditioning: PT/OT consulted today  Nutrition Problem: Inadequate oral intake Etiology: acute illness      DVT prophylaxis: Heparin subcu Code Status: Full Family Communication: None at the bedside Patient status:  Dispo: The patient is from: Home              Anticipated d/c is to: Home              Anticipated d/c date is: in next 1 to 2 days  Consultants: PCCM,cardiology  Procedures:PCI  Antimicrobials:  Anti-infectives (From admission, onward)    Start     Dose/Rate Route Frequency Ordered Stop   09/26/21 1200  levofloxacin (LEVAQUIN) IVPB 750 mg        750 mg 100 mL/hr over 90 Minutes Intravenous Every 24 hours 09/26/21 0953 09/28/21 1246   09/24/21 1145  aztreonam (AZACTAM) 1 g in sodium chloride 0.9 % 100 mL IVPB  Status:  Discontinued        1 g 200 mL/hr over 30 Minutes Intravenous Every 8  hours 09/24/21 1046 09/26/21 0953   09/23/21 1800  azithromycin (ZITHROMAX) 250 mg in dextrose 5 % 125 mL IVPB  Status:  Discontinued        250 mg 127.5 mL/hr over 60 Minutes Intravenous Every 24 hours 09/23/21 1706 09/26/21 0953   09/23/21 0104  vancomycin (VANCOREADY) IVPB 500 mg/100 mL        over 60 Minutes  Continuous PRN 09/23/21 0104 09/23/21  0104       Subjective:  Patient seen and examined at the bedside this afternoon.  Hemodynamically stable.  On 2 L of oxygen per minute.  Alert and oriented.  Denies any shortness of breath or cough.  Objective: Vitals:   09/30/21 0900 09/30/21 1000 09/30/21 1055 09/30/21 1100  BP:   (!) 121/49   Pulse: 100 78 84 88  Resp:   19   Temp:   99 F (37.2 C)   TempSrc:   Oral   SpO2: 96% 98% 95% 100%  Weight:      Height:        Intake/Output Summary (Last 24 hours) at 09/30/2021 1334 Last data filed at 09/30/2021 0941 Gross per 24 hour  Intake 890 ml  Output --  Net 890 ml   Filed Weights   09/26/21 0530 09/28/21 0417 09/29/21 0454  Weight: 59.3 kg 57.6 kg 56.9 kg    Examination:  General exam: Overall comfortable, not in distress, deconditioned, chronically ill looking HEENT: PERRL, feeding tube Respiratory system: Diminished breath sounds bilaterally, no wheezes or crackles  Cardiovascular system: S1 & S2 heard, RRR.  Gastrointestinal system: Abdomen is nondistended, soft and nontender. Central nervous system: Alert and oriented Extremities: No edema, no clubbing ,no cyanosis Skin: No rashes, no ulcers,no icterus      Data Reviewed: I have personally reviewed following labs and imaging studies  CBC: Recent Labs  Lab 09/23/21 2341 09/24/21 0221 09/25/21 0241 09/26/21 0356  WBC  --  23.3* 30.7* 21.7*  HGB 9.5* 8.7* 8.5* 8.1*  HCT 28.0* 27.8* 27.7* 25.5*  MCV  --  90.6 90.2 90.1  PLT  --  331 456* 410*   Basic Metabolic Panel: Recent Labs  Lab 09/24/21 0221 09/24/21 1544 09/25/21 0241 09/25/21 0630 09/26/21 0356 09/27/21 0356 09/28/21 0428 09/29/21 0441  NA 132* 133*  --  134* 135 138 140 140  K 4.1 3.5  --  4.0 4.1 3.9 3.7 3.8  CL 95* 93*  --  93* 87* 88* 92* 95*  CO2 26 30  --  30 41* 43* 41* 38*  GLUCOSE 248* 181*  --  156* 170* 172* 189* 144*  BUN 20 24*  --  23 25* 34* 31* 37*  CREATININE 1.29* 1.08*  --  0.96 0.94 0.96 0.90 0.96  CALCIUM  8.1* 7.9*  --  7.9* 7.8* 8.1* 8.4* 8.7*  MG 3.0* 2.4 2.2  --  2.5* 2.3  --  2.3  PHOS 4.0  --  2.4*  --  3.0 3.5  --   --    GFR: Estimated Creatinine Clearance: 49 mL/min (by C-G formula based on SCr of 0.96 mg/dL). Liver Function Tests: Recent Labs  Lab 09/24/21 0221  AST 53*  ALT 37  ALKPHOS 87  BILITOT 0.6  PROT 6.0*  ALBUMIN 2.7*   No results for input(s): LIPASE, AMYLASE in the last 168 hours. No results for input(s): AMMONIA in the last 168 hours. Coagulation Profile: No results for input(s): INR, PROTIME in the last 168 hours. Cardiac  Enzymes: No results for input(s): CKTOTAL, CKMB, CKMBINDEX, TROPONINI in the last 168 hours. BNP (last 3 results) No results for input(s): PROBNP in the last 8760 hours. HbA1C: No results for input(s): HGBA1C in the last 72 hours. CBG: Recent Labs  Lab 09/29/21 1146 09/29/21 1524 09/29/21 2136 09/30/21 0731 09/30/21 1130  GLUCAP 98 204* 125* 109* 76   Lipid Profile: No results for input(s): CHOL, HDL, LDLCALC, TRIG, CHOLHDL, LDLDIRECT in the last 72 hours. Thyroid Function Tests: No results for input(s): TSH, T4TOTAL, FREET4, T3FREE, THYROIDAB in the last 72 hours. Anemia Panel: No results for input(s): VITAMINB12, FOLATE, FERRITIN, TIBC, IRON, RETICCTPCT in the last 72 hours. Sepsis Labs: No results for input(s): PROCALCITON, LATICACIDVEN in the last 168 hours.  Recent Results (from the past 240 hour(s))  Resp Panel by RT-PCR (Flu A&B, Covid) Nasopharyngeal Swab     Status: None   Collection Time: 09/23/21 12:08 AM   Specimen: Nasopharyngeal Swab; Nasopharyngeal(NP) swabs in vial transport medium  Result Value Ref Range Status   SARS Coronavirus 2 by RT PCR NEGATIVE NEGATIVE Final    Comment: (NOTE) SARS-CoV-2 target nucleic acids are NOT DETECTED.  The SARS-CoV-2 RNA is generally detectable in upper respiratory specimens during the acute phase of infection. The lowest concentration of SARS-CoV-2 viral copies this assay  can detect is 138 copies/mL. A negative result does not preclude SARS-Cov-2 infection and should not be used as the sole basis for treatment or other patient management decisions. A negative result may occur with  improper specimen collection/handling, submission of specimen other than nasopharyngeal swab, presence of viral mutation(s) within the areas targeted by this assay, and inadequate number of viral copies(<138 copies/mL). A negative result must be combined with clinical observations, patient history, and epidemiological information. The expected result is Negative.  Fact Sheet for Patients:  EntrepreneurPulse.com.au  Fact Sheet for Healthcare Providers:  IncredibleEmployment.be  This test is no t yet approved or cleared by the [place] FDA and  has been authorized for detection and/or diagnosis of SARS-CoV-2 by FDA under an Emergency Use Authorization (EUA). This EUA will remain  in effect (meaning this test can be used) for the duration of the COVID-19 declaration under Section 564(b)(1) of the Act, 21 U.S.C.section 360bbb-3(b)(1), unless the authorization is terminated  or revoked sooner.       Influenza A by PCR NEGATIVE NEGATIVE Final   Influenza B by PCR NEGATIVE NEGATIVE Final    Comment: (NOTE) The Xpert Xpress SARS-CoV-2/FLU/RSV plus assay is intended as an aid in the diagnosis of influenza from Nasopharyngeal swab specimens and should not be used as a sole basis for treatment. Nasal washings and aspirates are unacceptable for Xpert Xpress SARS-CoV-2/FLU/RSV testing.  Fact Sheet for Patients: EntrepreneurPulse.com.au  Fact Sheet for Healthcare Providers: IncredibleEmployment.be  This test is not yet approved or cleared by the [place] FDA and has been authorized for detection and/or diagnosis of SARS-CoV-2 by FDA under an Emergency Use Authorization (EUA). This EUA will  remain in effect (meaning this test can be used) for the duration of the COVID-19 declaration under Section 564(b)(1) of the Act, 21 U.S.C. section 360bbb-3(b)(1), unless the authorization is terminated or revoked.  Performed at [facility], [place] [address], [place], [place] [address]   MRSA Next Gen by PCR, Nasal     Status: None   Collection Time: 09/23/21  1:24 AM   Specimen: Nasal Mucosa; Nasal Swab  Result Value Ref Range Status   MRSA by PCR  Next Gen NOT DETECTED NOT DETECTED Final    Comment: (NOTE) The GeneXpert MRSA Assay (FDA approved for NASAL specimens only), is one component of a comprehensive MRSA colonization surveillance program. It is not intended to diagnose MRSA infection nor to guide or monitor treatment for MRSA infections. Test performance is not FDA approved in patients less than 5 years old. Performed at [facility], [place] [address], [place], [place] [address]   Culture, Respiratory w Gram Stain     Status: None   Collection Time: 09/24/21 12:22 PM   Specimen: Tracheal Aspirate; Respiratory  Result Value Ref Range Status   Specimen Description TRACHEAL ASPIRATE  Final   Special Requests NONE  Final   Gram Stain   Final    RARE WBC PRESENT,BOTH PMN AND MONONUCLEAR NO ORGANISMS SEEN    Culture   Final    NO GROWTH 2 DAYS Performed at [facility], [address] [place], [place] [address]    Report Status 09/26/2021 FINAL  Final         Radiology Studies: No results found.      Scheduled Meds:  arformoterol  15 mcg Nebulization BID   [START ON 10/01/2021] aspirin  81 mg Oral Daily   [START ON 10/01/2021] atorvastatin  80 mg Oral Daily   budesonide (PULMICORT) nebulizer solution  0.25 mg Nebulization BID   Chlorhexidine Gluconate Cloth  6 each Topical Daily   dapagliflozin propanediol  10 mg Oral Daily   feeding supplement  237 mL Oral BID BM   heparin  5,000 Units Subcutaneous Q8H   insulin aspart  0-15 Units  Subcutaneous TID WC   insulin aspart  5 Units Subcutaneous TID WC   ipratropium-albuterol  3 mL Nebulization Q6H   mouth rinse  15 mL Mouth Rinse BID   metoprolol tartrate  25 mg Oral BID   nicotine  21 mg Transdermal Daily   oxyCODONE  5 mg Oral Q8H   pantoprazole  40 mg Oral BID   QUEtiapine  25 mg Oral QHS   revefenacin  175 mcg Nebulization Daily   sodium chloride flush  10-40 mL Intracatheter Q12H   ticagrelor  90 mg Oral BID   Continuous Infusions:  sodium chloride Stopped (09/23/21 1753)     LOS: 7 days    Time spent:35 mins. More than 50% of that time was spent in counseling and/or coordination of care.      Shelly Coss, MD [facility] P12/14/2022, 1:34 PM

## 2021-09-30 NOTE — Progress Notes (Signed)
Speech Language Pathology Treatment: Dysphagia  Patient Details Name: [name] MRN: [id number] DOB: [birth year] Today's Date: 09/30/2021 Time: 2376-2831 SLP Time Calculation (min) (ACUTE ONLY): 11 min  Assessment / Plan / Recommendation Clinical Impression  Pt has c/o odynophagia this morning, but upon further questioning, it seems like she is having a heartburn sensation any time she is trying to eat or drink. As a result she is really not wanting to eat or drink much, but was willing to try a little thin liquids and applesauce. No overt s/s of dysphagia or aspiration were noted otherwise. He denies having much heartburn at baseline except when she eats bell peppers. Pt may benefit from medical management of reflux symptoms to increase nutritional intake, but oropharyngeal swallow seems functional. At this time, she wants to stay on current diet textures.    HPI HPI: Pt is a 65 yo female who presented to [facility] ED 12/7 after being found unresponsive. PT was intubated in the field and found to have STEMI, so was transferred to [facility] for PCI of RCA. She was extubated post-procedure but reintubated 12/7-12/11. PMH includes: CAD s/p CABG x 4, HTN, HLD, DM, COPD.      SLP Plan  Continue with current plan of care      Recommendations for follow up therapy are one component of a multi-disciplinary discharge planning process, led by the attending physician.  Recommendations may be updated based on patient status, additional functional criteria and insurance authorization.    Recommendations  Diet recommendations: Dysphagia 2 (fine chop);Thin liquid Liquids provided via: Cup;Straw Medication Administration: Crushed with puree Supervision: Patient able to self feed;Full supervision/cueing for compensatory strategies Compensations: Slow rate;Small sips/bites Postural Changes and/or Swallow Maneuvers: Seated upright 90 degrees                Oral Care Recommendations: Oral care  BID Follow Up Recommendations: No SLP follow up Assistance recommended at discharge: Intermittent Supervision/Assistance SLP Visit Diagnosis: Dysphagia, unspecified (R13.10) Plan: Continue with current plan of care           Mahala Menghini., M.A. CCC-SLP Acute Rehabilitation Services Pager [phone] Office [phone]  09/30/2021, 11:02 AM

## 2021-09-30 NOTE — Progress Notes (Signed)
09/30/21 2118 09/30/21 2135 09/30/21 2245  Assess: MEWS Score  Temp 98.8 F (37.1 C)  --   --   BP 117/69  --  131/63  Pulse Rate (!) 134  --  (!) 116  ECG Heart Rate (!) 134  --  (!) 120  Resp 18  --   --   Level of Consciousness Alert Alert  --   SpO2 92 %  --  95 %  O2 Device Nasal Cannula  --   --   O2 Flow Rate (L/min) 3 L/min  --   --   Assess: MEWS Score  MEWS Temp 0 0 0  MEWS Systolic 0 0 0  MEWS Pulse 3 3 2   MEWS RR 0 0 0  MEWS LOC 0 0 0  MEWS Score 3 3 2   MEWS Score Color Yellow Yellow Yellow  Assess: if the MEWS score is Yellow or Red  Were vital signs taken at a resting state?  --  Yes  --   Focused Assessment  --  No change from prior assessment  --   Early Detection of Sepsis Score *See Row Information*  --  Low  --   MEWS guidelines implemented *See Row Information*  --  Yes  --   Treat  Pain Scale 0-10  --   --   Pain Score 0  --   --   Patients Stated Pain Goal 0  --   --   Take Vital Signs  Increase Vital Sign Frequency   --  Yellow: Q 2hr X 2 then Q 4hr X 2, if remains yellow, continue Q 4hrs  --   Escalate  MEWS: Escalate  --  Yellow: discuss with charge nurse/RN and consider discussing with provider and RRT  --   Notify: Charge Nurse/RN  Name of Charge Nurse/RN Notified  --  , RN  --   Date Charge Nurse/RN Notified  --  09/30/21  --   Time Charge Nurse/RN Notified  --  2143  --   Document  Patient Outcome  --  Stabilized after interventions  --     09/30/21 2255 09/30/21 2310 09/30/21 2325  Assess: MEWS Score  Temp  --  98.7 F (37.1 C)  --   BP 131/83 (!) 100/53 (!) 106/52  Pulse Rate (!) 119 78 96  ECG Heart Rate (!) 118 79 91  Resp  --  20  --   Level of Consciousness  --  Alert  --   SpO2 97 % 98 % 96 %  O2 Device  --  Nasal Cannula  --   O2 Flow Rate (L/min)  --  2 L/min  --   Assess: MEWS Score  MEWS Temp 0 0 0  MEWS Systolic 0 1 0  MEWS Pulse 2 0 0  MEWS RR 0 0 0  MEWS LOC 0 0 0  MEWS Score 2 1 0  MEWS Score Color Yellow  Green Green  Assess: if the MEWS score is Yellow or Red  Were vital signs taken at a resting state?  --   --   --   Focused Assessment  --   --   --   Early Detection of Sepsis Score *See Row Information*  --   --   --   MEWS guidelines implemented *See Row Information*  --   --   --   Treat  Pain Scale  --  0-10  --   Pain Score  --  0  --   Patients Stated Pain Goal  --  0  --   Take Vital Signs  Increase Vital Sign Frequency   --   --   --   Escalate  MEWS: Escalate  --   --   --   Notify: Charge Nurse/RN  Name of Charge Nurse/RN Notified  --   --   --   Date Charge Nurse/RN Notified  --   --   --   Time Charge Nurse/RN Notified  --   --   --   Document  Patient Outcome  --   --   --     09/30/21 2340  Assess: MEWS Score  Temp  --   BP (!) 101/59  Pulse Rate (!) 114  ECG Heart Rate (!) 110  Resp  --   Level of Consciousness  --   SpO2 96 %  O2 Device  --   O2 Flow Rate (L/min)  --   Assess: MEWS Score  MEWS Temp 0  MEWS Systolic 0  MEWS Pulse 1  MEWS RR 0  MEWS LOC 0  MEWS Score 1  MEWS Score Color Green  Assess: if the MEWS score is Yellow or Red  Were vital signs taken at a resting state?  --   Focused Assessment  --   Early Detection of Sepsis Score *See Row Information*  --   MEWS guidelines implemented *See Row Information*  --   Treat  Pain Scale  --   Pain Score  --   Patients Stated Pain Goal  --   Take Vital Signs  Increase Vital Sign Frequency   --   Escalate  MEWS: Escalate  --   Notify: Charge Nurse/RN  Name of Charge Nurse/RN Notified  --   Date Charge Nurse/RN Notified  --   Time Charge Nurse/RN Notified  --   Document  Patient Outcome  --

## 2021-09-30 NOTE — Telephone Encounter (Signed)
Please schedule patient for hospital follow-up with me in mid to late January for COPD exacerbation and pneumonia.  Thanks, JD

## 2021-09-30 NOTE — Progress Notes (Signed)
@  2138, Dr. Leafy Half, on-call for attending, text-paged regarding pt's conversion to Afib RVR (confirmed by 12 lead) 30 minutes post respiratory treatment. Page returned and interventions ordered and implemented (including labs and medications). Will continue to monitor and assess.

## 2021-09-30 NOTE — Progress Notes (Signed)
Per discussion in secure chat with Dr. Renford Dills, cortrak removed.  Tolerated well. No distress observed.

## 2021-09-30 NOTE — Progress Notes (Signed)
NAME:  [name], MRN:  [id number], DOB:  [birth year], LOS: 7 ADMISSION DATE:  09/23/2021, CONSULTATION DATE:  09/23/21 REFERRING MD:  Einar Gip CHIEF COMPLAINT:  STEMI   History of Present Illness:  Pt is encephelopathic; therefore, this HPI is obtained from chart review. [name] is a 65 y.o. female who has a PMH as outlined below including CAD s/p CABG x 4, HTN, HLD, DM, COPD.  She presented to [facility] ED 12/7 after husband found at her at home unresponsive.  EMS was called and she was intubated in the field prior to transport to ED where she was found to have inferolateral STEMI.  She was subsequently transferred to [facility] for emergent PCI.  She was taken to cath lab and had stenting of the RCA (procedure note pending).  She then was transferred to the CVICU where PCCM asked to see in consultation. Upon our arrival, she is awake, following all commands on vent.  She has been weaning on PSV 5/5 and tolerating well.  Pertinent  Medical History:  has CLOSTRIDIUM DIFFICILE COLITIS; Hyperlipidemia; ANXIETY; ANXIETY DEPRESSION; TOBACCO ABUSE; Migraine; PERIPHERAL NEUROPATHY; ENDOTHELIAL CORNEAL DYSTROPHY; DISEASE, HYPERTENSIVE HEART NOS, W/O HF; PERIPHERAL VASCULAR DISEASE; DYSPLASTIC NEVUS, BACK; ASTHMA; GERD; DIVERTICULOSIS, COLON; IRRITABLE BOWEL SYNDROME; ACUTE CYSTITIS; MICROSCOPIC HEMATURIA; BREAST MASS; SEBACEOUS CYST; DEGENERATIVE DISC DISEASE, LUMBAR SPINE; BACK PAIN, LUMBAR; BACK PAIN, CHRONIC; WEIGHT LOSS; OTHER DYSPHAGIA; ABDOMINAL PAIN -GENERALIZED; GASTROINTESTINAL XRAY, ABNORMAL; CONTUSION, HIP, RIGHT; COPD exacerbation ([place]); Acute respiratory failure with hypoxia ([place]); 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[place]); Essential hypertension; Chronic pain syndrome; Overweight (BMI 25.0-29.9); Coronary artery disease involving native coronary artery of native heart with unstable angina pectoris ([place]); Palpitations; Renal artery stenosis, non-flow-limiting ([place]); Hx of CABG; Asthma exacerbation;  Anaphylactic reaction; Hypertensive urgency; Paroxysmal tachycardia, unspecified ([place]); Acute diverticulitis; COPD with chronic bronchitis ([place]); Sepsis ([place]); Diverticulitis; H/O total shoulder replacement, left; Acute ST elevation myocardial infarction (STEMI) of inferior wall ([place]); Acute ST elevation myocardial infarction (STEMI) involving other coronary artery of inferior wall ([place]); Acute diastolic heart failure ([place]); and Hypoxia on their problem list.  Significant Hospital Events: Including procedures, antibiotic start and stop dates in addition to other pertinent events   12/7 admitted with inferolateral STEMI, taken for PCI of RCA, extubated, reintubated for respiratory failure 12/11 Extubated required precedex for overnight agitation 12/12 agitation requiring Precedex and lorazepam 12/13 extubated  12/14 tapering seroquel. Adding flutter and IS   Interim History / Subjective:  Feeling better slowly   Objective:  Blood pressure (Abnormal) 121/49, pulse 88, temperature 99 F (37.2 C), temperature source Oral, resp. rate 19, height 5\' 3"  (1.6 m), weight 56.9 kg, SpO2 100 %.        Intake/Output Summary (Last 24 hours) at 09/30/2021 1135 Last data filed at 09/30/2021 0941 Gross per 24 hour  Intake 890 ml  Output no documentation  Net 890 ml   Filed Weights   09/26/21 0530 09/28/21 0417 09/29/21 0454  Weight: 59.3 kg 57.6 kg 56.9 kg    Examination:  General this is a 65 year old female she is resting in bed. She is in no distress HENT NCAT vocal quality very hoarse  Pulm prolonged distant expiratory wheeze Card rrr Abd soft Ext warm  Neuro intact   Assessment & Plan:   Was Critically ill due to acute hyperactive delirium requiring Precedex titration.  Acute on chronic respiratory failure with hypoxia and hypercapnia NIV at night time Acute COPD exacerbation  Acute pulmonary edema LLL pneumonia- Inferolateral STEMI - s/p DES of  RCA. Acute HFrEF, G3DD Moderate to  severe MR-- concern for papillary muscle dysfunction 2/2 ischemia Chronic anemia-stable Leukocytosis, suspect reactive, but has been chronically elevated Hyperglycemia, diabetes (probably new diagnosis) A1c 6.7 Low TSH-- likely euthyroid sick syndrome with normal freeT4 Recent total shoulder arthroplasty on the left   Pulmonary problem list  Acute on Chronic Hypoxic and Hypercarbic respiratory failure due to LLL Pneumonia w/ AECOPD c/b Acute Pulmonary edema s/p inferolateral MI and HFrER w/ G3DD Reflux H/o esophageal stricture.   Discussion Seen by SLP, no evidence of dysphagia but was having sig reflux. Completed steroids.  PCXR yesterday w/ worsening bibasilar airspace disease.  O2 requirement: 2 lpm - I&O balance  -has some pain swallowing and trouble swallowing. Would benefit from esophagram but apparently allergic to barium. I am not sure even if we found anything at this point she could have dilation so for now will rec aspiration and reflux precautions.   Plan Cont BDs Wean Oxygen Mobilize IS/flutter Dc bipap Increase PPI to bid Reflux precautions Allergic to idodine and barium (she says) so for now we will hold off on esophagram, she has h/o esophageal stricture.  Will need GI eval as out pt    Best practice (evaluated daily):  Per primary   Simonne Martinet ACNP-BC [facility] Pulmonary/Critical Care Pager # [phone] OR # [phone] if no answer

## 2021-09-30 NOTE — Progress Notes (Addendum)
HOSPITAL MEDICINE OVERNIGHT EVENT NOTE    Notified by nursing the patient has suddenly gone into rapid atrial fibrillation with heart rates as high as 150 bpm.  Patient is remained hemodynamically stable and denies chest pain or shortness of breath.    Based on chart review, this is a new onset of atrial fibrillation.  EKG obtained.  Troponin, chemistry and magnesium ordered.  Echocardiogram ordered for the morning.  Patient had a recent thyroid panel that was equivocal.  Attempting to achieve rate control with several doses of intravenous metoprolol.  We will consider diltiazem if metoprolol unsuccessful.  Marinda Elk  MD [facility]    ADDENDUM 4AM  Patient converted to normal sinus rhythm after 2 separate doses of 5 mg of intravenous metoprolol.  Work-up reveals elevated troponin at 907, although this is markedly less than 17,998 on 12/7.   We will continue to trend to ensure that this is continue to decrease.  Electrolytes reveal a normal magnesium but a low potassium at 3.2.  Will replace with 40 mill equivalents of potassium chloride p.o. every 4 hours x2 doses.  D-dimer is 3.69.  Considering patient's sudden onset of rapid atrial fibrillation pulmonary embolism must be ruled out and therefore we will proceed with CT angiogram of the chest.  Continuing to monitor patient closely on telemetry.  Deno Lunger Desiree Fleming

## 2021-10-01 ENCOUNTER — Inpatient Hospital Stay (HOSPITAL_COMMUNITY): Payer: 59

## 2021-10-01 ENCOUNTER — Other Ambulatory Visit (HOSPITAL_COMMUNITY): Payer: Self-pay

## 2021-10-01 ENCOUNTER — Other Ambulatory Visit: Payer: Self-pay

## 2021-10-01 ENCOUNTER — Telehealth: Payer: Self-pay

## 2021-10-01 ENCOUNTER — Encounter (HOSPITAL_COMMUNITY): Payer: Self-pay | Admitting: Cardiology

## 2021-10-01 LAB — TROPONIN I (HIGH SENSITIVITY)
Troponin I (High Sensitivity): 907 ng/L (ref <18)
Troponin I (High Sensitivity): 874 ng/L (ref <18)

## 2021-10-01 LAB — BASIC METABOLIC PANEL
Anion gap: 8 (ref 5–15)
BUN: 23 mg/dL (ref 8–23)
CO2: 32 mmol/L (ref 22–32)
Calcium: 7.8 mg/dL — ABNORMAL LOW (ref 8.9–10.3)
Chloride: 97 mmol/L — ABNORMAL LOW (ref 98–111)
Creatinine, Ser: 0.9 mg/dL (ref 0.44–1.00)
GFR, Estimated: >60 mL/min (ref >60)
Glucose, Bld: 108 mg/dL — ABNORMAL HIGH (ref 70–99)
Potassium: 3.2 mmol/L — ABNORMAL LOW (ref 3.5–5.1)
Sodium: 137 mmol/L (ref 135–145)

## 2021-10-01 LAB — GLUCOSE, CAPILLARY
Glucose-Capillary: 103 mg/dL — ABNORMAL HIGH (ref 70–99)
Glucose-Capillary: 105 mg/dL — ABNORMAL HIGH (ref 70–99)
Glucose-Capillary: 102 mg/dL — ABNORMAL HIGH (ref 70–99)

## 2021-10-01 LAB — PROCALCITONIN: Procalcitonin: <0.1 ng/mL

## 2021-10-01 LAB — MAGNESIUM: Magnesium: 2.1 mg/dL (ref 1.7–2.4)

## 2021-10-01 LAB — D-DIMER, QUANTITATIVE: D-Dimer, Quant: 3.69 ug/mL-FEU — ABNORMAL HIGH (ref 0.00–0.50)

## 2021-10-01 MED ORDER — DILTIAZEM HCL-DEXTROSE 125-5 MG/125ML-% IV SOLN (PREMIX)
5.0000 mg/h | INTRAVENOUS | Status: DC
  Administered 2021-10-01: 5 mg/h via INTRAVENOUS
  Administered 2021-10-01: 7.5 mg/h via INTRAVENOUS
  Filled 2021-10-01 (×2): qty 125

## 2021-10-01 MED ORDER — BOOST / RESOURCE BREEZE PO LIQD CUSTOM
1.0000 | Freq: Three times a day (TID) | ORAL | Status: DC
  Administered 2021-10-01 – 2021-10-02 (×2): 1 via ORAL

## 2021-10-01 MED ORDER — IOHEXOL 350 MG/ML SOLN
50.0000 mL | Freq: Once | INTRAVENOUS | Status: AC | PRN
  Administered 2021-10-01: 50 mL via INTRAVENOUS

## 2021-10-01 MED ORDER — APIXABAN 5 MG PO TABS
5.0000 mg | ORAL_TABLET | Freq: Two times a day (BID) | ORAL | Status: DC
  Administered 2021-10-02: 5 mg via ORAL
  Filled 2021-10-01 (×2): qty 1

## 2021-10-01 MED ORDER — METOPROLOL TARTRATE 50 MG PO TABS
50.0000 mg | ORAL_TABLET | Freq: Two times a day (BID) | ORAL | Status: DC
  Administered 2021-10-01 – 2021-10-02 (×3): 50 mg via ORAL
  Filled 2021-10-01 (×3): qty 1

## 2021-10-01 MED ORDER — POTASSIUM CHLORIDE 20 MEQ PO PACK
40.0000 meq | PACK | ORAL | Status: DC
  Administered 2021-10-01: 40 meq via ORAL
  Filled 2021-10-01 (×2): qty 2

## 2021-10-01 MED ORDER — POTASSIUM CHLORIDE CRYS ER 20 MEQ PO TBCR
40.0000 meq | EXTENDED_RELEASE_TABLET | Freq: Once | ORAL | Status: AC
  Administered 2021-10-01: 40 meq via ORAL
  Filled 2021-10-01: qty 2

## 2021-10-01 MED ORDER — IPRATROPIUM-ALBUTEROL 0.5-2.5 (3) MG/3ML IN SOLN: 3.0000 mL | Freq: Four times a day (QID) | RESPIRATORY_TRACT | Status: DC | PRN

## 2021-10-01 MED ORDER — DILTIAZEM LOAD VIA INFUSION
10.0000 mg | Freq: Once | INTRAVENOUS | Status: AC
  Administered 2021-10-01: 10 mg via INTRAVENOUS
  Filled 2021-10-01: qty 10

## 2021-10-01 NOTE — Progress Notes (Signed)
Nutrition Follow-up  DOCUMENTATION CODES:   Not applicable  INTERVENTION:   D/C Ensure Enlive, patient does not like. Add Boost Breeze po TID, each supplement provides 250 kcal and 9 grams of protein. Add Magic cup TID with meals, each supplement provides 290 kcal and 9 grams of protein Diet per SLP recommendations.  NUTRITION DIAGNOSIS:   Inadequate oral intake related to acute illness as evidenced by NPO status.  Ongoing   GOAL:   Patient will meet greater than or equal to 90% of their needs  Progressing   MONITOR:   PO intake, Supplement acceptance, Labs, Weight trends  REASON FOR ASSESSMENT:   Consult, Ventilator Enteral/tube feeding initiation and management  ASSESSMENT:   65 yo female admitted with acute inferior STEMI, acute respiratory failure secondary to PMH includes COPD, IBS, HTN, DM, CKD 3  Cortrak removed 12/14. Patient developed A fib with RVR this morning. Cardiology following.  Patient states she is not eating well. If she doesn't like the way something tastes, she will not eat it. She has lactose intolerance symptoms (diarrhea, abdominal pain) sometimes when she eats or drinks something with lactose, but not all the time. She has not found a correlation between specific foods/beverages and her symptoms. She likes the Boost Breeze supplements okay and is willing to continue drinking them. She also agreed to try chocolate or orange cream magic cups with meals.   Labs reviewed. K 3.2 CBG: 103-105  Medications reviewed and include Novolog, Protonix.  Weight trending down with negative fluid balance.   Diet Order:   Diet Order             DIET DYS 2 Room service appropriate? Yes; Fluid consistency: Thin  Diet effective now                   EDUCATION NEEDS:   Not appropriate for education at this time  Skin:  Skin Assessment: Reviewed RN Assessment  Last BM:  12/14  Height:   Ht Readings from Last 1 Encounters:  09/29/21 5\' 3"   (1.6 m)    Weight:   Wt Readings from Last 1 Encounters:  09/29/21 56.9 kg    BMI:  Body mass index is 22.22 kg/m.  Estimated Nutritional Needs:   Kcal:  1550-1750 kcals  Protein:  80-90 g  Fluid:  >/= 1.5 L    10/01/21, RD, LDN, CNSC Please refer to Amion for contact information.

## 2021-10-01 NOTE — Progress Notes (Addendum)
OT Cancellation Note  Patient Details Name: [name] MRN: [id number] DOB: 07-May-1956   Cancelled Treatment:    Reason Eval/Treat Not Completed: Patient not medically ready Pt currently with a fib and HR up to 140s at rest. Per RN, starting on IV meds to address HR. Plan to check back in PM for OT eval pending medical appropriateness.  Pt also with recent L shoulder arthroplasty. Per pt, husband plans to bring sling around noon today. Will need sling prior to OOB attempts.  Lorre Munroe 10/01/2021, 10:12 AM

## 2021-10-01 NOTE — Progress Notes (Signed)
CARDIAC REHAB PHASE I   MI/stent education completed with pt and spouse. Pt educated on importance of ASA and Brilnta. Pt given MI book. Pt states she is done smoking.  Encouraged pt to ease back into activities with emphasis on safety. Will refer to CRP II [place] at pts request.  [id number] Reynold Bowen, RN BSN 10/01/2021 2:43 PM

## 2021-10-01 NOTE — Evaluation (Signed)
Occupational Therapy Evaluation Patient Details Name: [name] MRN: [id number] DOB: 02-23-1956 Today's Date: 10/01/2021   History of Present Illness Pt is a 65 y.o. female who presented 09/22/21 with acute inferolateral STEMI. Pt was found unresponsive at home and intubated in the field. S/p PCI/emergent cardiac catheterization 12/6. Extubated 12/7. Re-intubated 12/7 - 12/11. During hospitalization, pt went into new onset afib. 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 Since L shoulder arthroplasty 09/16/21, spouse has been providing light assist for ADLs due to precautions and pt still able to mobilize independently. Pt presents now with deficits in standing balance, strength and endurance. Pt able to mobilize hallway distances without AD but requires Min A due to intermittent LOB occurrences. Pt requires overall Min A for UB/LB ADLs due to deficits. Anticipate good progress towards goals and pt confirms husband can continue assisting with ADLs at DC. Pt reports tentative plan was to start OP therapy for shoulder next week - pt appropriate to begin OP therapies pending surgeon's plan for shoulder rehab at DC.  SpO2 93% on 1 L O2, 88% on RA HR 94bpm with mobility      Recommendations for follow up therapy are one component of a multi-disciplinary discharge planning process, led by the attending physician.  Recommendations may be updated based on patient status, additional functional criteria and insurance authorization.   Follow Up Recommendations  Other (comment) (OP PT vs OP OT for shoulder rehab)    Assistance Recommended at Discharge Intermittent Supervision/Assistance  Functional Status Assessment  Patient has had a recent  decline in their functional status and demonstrates the ability to make significant improvements in function in a reasonable and predictable amount of time.  Equipment Recommendations  None recommended by OT    Recommendations for Other Services       Precautions / Restrictions Precautions Precautions: Fall;Shoulder Type of Shoulder Precautions: No A/PROM at shoulder Shoulder Interventions: Shoulder sling/immobilizer;Off for dressing/bathing/exercises Restrictions Weight Bearing Restrictions: Yes LUE Weight Bearing: Non weight bearing Other Position/Activity Restrictions: Pillow behind shoulder seated and supine      Mobility Bed Mobility               General bed mobility comments: sitting EOB with PT    Transfers Overall transfer level: Needs assistance Equipment used: None Transfers: Sit to/from Stand Sit to Stand: Supervision           General transfer comment: able to stand without assist      Balance Overall balance assessment: Needs assistance Sitting-balance support: No upper extremity supported;Feet supported Sitting balance-Leahy Scale: Good     Standing balance support: No upper extremity supported;During functional activity Standing balance-Leahy Scale: Fair Standing balance comment: fair static standing, able to mobilize without AD though requires Min A to correct approx 3 LOB                           ADL either performed or assessed with clinical judgement   ADL Overall ADL's : Needs assistance/impaired     Grooming: Min guard;Standing   Upper Body Bathing: Sitting;Minimal assistance Upper Body Bathing Details (indicate cue type and reason): assist to manage sling with pt able to assist in caregiver direction  Upper Body Dressing : Minimal assistance;Sitting   Lower Body Dressing: Minimal assistance;Sit to/from stand Lower Body Dressing Details (indicate cue type and reason): able to don slippers crossing LEs. Will  likely need assist donning over L waist in standing Toilet Transfer: Minimal assistance;Ambulation   Toileting- Clothing Manipulation and Hygiene: Minimal assistance;Sit to/from stand       Functional mobility during ADLs: Minimal assistance General ADL Comments: Pt with standing balance deficits with frequent LOB requiring assist to correct - likely due to first time mobilizing OOB and will improve with increased activity     Vision Baseline Vision/History: 1 Wears glasses Ability to See in Adequate Light: 0 Adequate Patient Visual Report: No change from baseline Vision Assessment?: No apparent visual deficits     Perception     Praxis      Pertinent Vitals/Pain Pain Assessment: Faces Faces Pain Scale: Hurts little more Pain Location: L shoulder Pain Descriptors / Indicators: Guarding;Grimacing Pain Intervention(s): Monitored during session;Limited activity within patient's tolerance;Patient requesting pain meds-RN notified     Hand Dominance Right   Extremity/Trunk Assessment Upper Extremity Assessment Upper Extremity Assessment: LUE deficits/detail LUE Deficits / Details: Shoulder sx 11/30, AROM of hand/elbow WFL LUE: Unable to fully assess due to immobilization LUE Sensation: WNL LUE Coordination: decreased gross motor   Lower Extremity Assessment Lower Extremity Assessment: Defer to PT evaluation   Cervical / Trunk Assessment Cervical / Trunk Assessment: Normal   Communication Communication Communication: No difficulties   Cognition Arousal/Alertness: Awake/alert Behavior During Therapy: WFL for tasks assessed/performed Overall Cognitive Status: Within Functional Limits for tasks assessed                                 General Comments: aware of shoulder precautions but benefits from reminders     General Comments  SpO2 88% on RA, sustained 93% with 1 L O2    Exercises     Shoulder Instructions      Home Living Family/patient expects  to be discharged to:: Private residence Living Arrangements: Spouse/significant other Available Help at Discharge: Family;Available 24 hours/day (husband returns to teach in January) Type of Home: House Home Access: Stairs to enter [employer] of Steps: 12 Entrance Stairs-Rails: Left Home Layout: Two level;Able to live on main level with bedroom/bathroom     Bathroom Shower/Tub: Chief Strategy Officer: Standard     Home Equipment: Shower seat;Cane - single point   Additional Comments: Husband works part time as Doctor, general practice; off until January      Prior Functioning/Environment Prior Level of Function : Independent/Modified Independent             Mobility Comments: No AD to mobilize at baseline. x1 fall in past 6 months that broke her L arm ADLs Comments: Typically Independent with ADLs, IADLs. Husband has been assisting with ADLs since shoulder replacement 11/30        OT Problem List: Pain;Decreased range of motion;Decreased strength;Impaired balance (sitting and/or standing);Decreased coordination      OT Treatment/Interventions: Self-care/ADL training;Energy conservation;DME and/or AE instruction;Therapeutic activities;Patient/family education;Therapeutic exercise    OT Goals(Current goals can be found in the care plan section) Acute Rehab OT Goals Patient Stated Goal: go home OT Goal Formulation: With patient Time For Goal Achievement: 10/15/21 Potential to Achieve Goals: Good  OT Frequency: Min 2X/week   Barriers to D/C:            Co-evaluation  AM-PAC OT "6 Clicks" Daily Activity     Outcome Measure Help from another person eating meals?: A Little Help from another person taking care of personal grooming?: A Little Help from another person toileting, which includes using toliet, bedpan, or urinal?: A Little Help from another person bathing (including washing, rinsing, drying)?: A Little Help from another  person to put on and taking off regular upper body clothing?: A Little Help from another person to put on and taking off regular lower body clothing?: A Little 6 Click Score: 18   End of Session Equipment Utilized During Treatment: Oxygen Nurse Communication: Mobility status;Patient requests pain meds  Activity Tolerance: Patient tolerated treatment well Patient left: in chair;with call bell/phone within reach;with chair alarm set  OT Visit Diagnosis: Unsteadiness on feet (R26.81);Other abnormalities of gait and mobility (R26.89);Muscle weakness (generalized) (M62.81)                Time: 9528-4132 OT Time Calculation (min): 26 min Charges:  OT General Charges $OT Visit: 1 Visit OT Evaluation $OT Eval Moderate Complexity: 1 Mod  Bradd Canary, OTR/L Acute Rehab Services Office: [phone]   Lorre Munroe 10/01/2021, 1:49 PM

## 2021-10-01 NOTE — TOC Benefit Eligibility Note (Signed)
Patient Advocate Encounter ° °Insurance verification completed.   ° °The patient is currently admitted and upon discharge could be taking Eliquis 5 mg. ° °The current 30 day co-pay is, $200.00 due to a $4,950.00 deductible remaining.  ° °The patient is insured through [employer]  ° ° ° °Tejon Gracie, CPhT °Pharmacy Patient Advocate Specialist °[facility] Patient Advocate Team °Direct Number: [phone]  Fax: [phone] ° ° ° ° ° °  °

## 2021-10-01 NOTE — Progress Notes (Signed)
No bipap needed at this time. No respiratory Distress noted.

## 2021-10-01 NOTE — Progress Notes (Signed)
PROGRESS NOTE    [name]  [id number] DOB: 11-Oct-1956 DOA: 09/23/2021 PCP: Patient, No Pcp Per (Inactive)   Chief Complain: Unresponsive  Brief Narrative: Patient is a 65 female with history of C. difficile colitis, hyperlipidemia, anxiety/depression, tobacco use, peripheral vascular disease, COPD, hypertension, coronary artery disease who presented to [facility] ED on 12/11 after she was found to be unresponsive at home.  She was brought to the emergency department intubated by EMS.  She was found to have inferolateral STEMI and was transferred to [facility] for emergent PCI.  She was taken to Cath Lab and had stenting of RCA.  She was on PCCM service.  Transferred to our service on 12/14..  Currently on 2 L of oxygen per minute. In the morning of 12/15, she went into A. fib with RVR .  Cardiology notified.Started on cardizem drip  Important events:  12/7 admitted with inferolateral STEMI, taken for PCI of RCA, extubated, reintubated for respiratory failure 12/11 Extubated required precedex for overnight agitation 12/12 agitation requiring Precedex and lorazepam 12/13 extubated    Assessment & Plan:   Principal Problem:   Acute ST elevation myocardial infarction (STEMI) of inferior wall (HCC) Active Problems:   Coronary artery disease involving native coronary artery of native heart with unstable angina pectoris (HCC)   Acute ST elevation myocardial infarction (STEMI) involving other coronary artery of inferior wall (HCC)   Acute diastolic heart failure ([place])   Hypoxia   Acute STEMI: Found to be unresponsive.  Underwent urgent PCI to RCA.  Cardiology recommend outpatient follow-up.  Continue aspirin , Brilinta,lipitor, metoprolol  Afib with RVR: New onset. She went into rapid A. fib this morning.  Started on Cardizem drip.  Anticoagulation not started yet as per cardiology.  Dr. Einar Gip following  Acute on chronic hypoxic/hypercarbic respiratory failure due to left lower  lobe pneumonia/acute COPD exacerbation: Had be intubated on presentation for airway protection.  PCCM following. Continue bronchodilators. Previous chest x-ray had some infiltrate on left mid and left lower lungs.  Treated with 5 days of antibiotics. CT angio did not show any PE but showed bilateral small pleural effusion, atelectasis, groundglass attenuation in the upper lobes  Dysphagia: Speech therapy following.  Recommended dysphagia 2 diet.Feeding tube removed.She was found to have significant reflux.On protonix.  Needs GI evaluation as an outpatient.  Delirium: Needed Precedex drip in ICU.  Monitor mental status.  On Seroquel.  Currently alert and oriented.  Moderate to severe mitral regurgitation: Concern for papillary muscle dysfunction secondary to ischemia.  Follow-up with cardiology as an outpatient.  Leukocytosis: On reviewing her previous lab works, her WBC is chronically elevated more than 20 K.  We recommend to follow-up with hematology as an outpatient.We will try to obtain an appointment  Diabetes type 2: Continue sliding scale insulin and NovoLog.  Hemoglobin A1c of 6.7. Started on farxiga  Low TSH: Likely euthyroid sick syndrome with normal free T4  Debility/deconditioning: PT/OT consulted today  Nutrition Problem: Inadequate oral intake Etiology: acute illness      DVT prophylaxis: Heparin subcu Code Status: Full Family Communication: None at the bedside Patient status:  Dispo: The patient is from: Home              Anticipated d/c is to: Home              Anticipated d/c date is: in next 1 to 2 days.Heart rate needs to be stable before dc  Consultants: PCCM,cardiology  Procedures:PCI  Antimicrobials:  Anti-infectives (  From admission, onward)    Start     Dose/Rate Route Frequency Ordered Stop   09/26/21 1200  levofloxacin (LEVAQUIN) IVPB 750 mg        750 mg 100 mL/hr over 90 Minutes Intravenous Every 24 hours 09/26/21 0953 09/28/21 1246   09/24/21  1145  aztreonam (AZACTAM) 1 g in sodium chloride 0.9 % 100 mL IVPB  Status:  Discontinued        1 g 200 mL/hr over 30 Minutes Intravenous Every 8 hours 09/24/21 1046 09/26/21 0953   09/23/21 1800  azithromycin (ZITHROMAX) 250 mg in dextrose 5 % 125 mL IVPB  Status:  Discontinued        250 mg 127.5 mL/hr over 60 Minutes Intravenous Every 24 hours 09/23/21 1706 09/26/21 0953   09/23/21 0104  vancomycin (VANCOREADY) IVPB 500 mg/100 mL        over 60 Minutes  Continuous PRN 09/23/21 0104 09/23/21 0104       Subjective:  Patient seen and examined bedside this morning.  Denies any complaints.  She went into A. fib with RVR this morning.  Heart rate was in the range of 120.  Denies any chest pain shortness of breath or palpitation.  Objective: Vitals:   10/01/21 0110 10/01/21 0215 10/01/21 0222 10/01/21 0315  BP: 133/66 (!) 139/59  135/61  Pulse: 85 86  91  Resp:    18  Temp:    98.7 F (37.1 C)  TempSrc:    Oral  SpO2: 94% 98% 95% 95%  Weight:      Height:        Intake/Output Summary (Last 24 hours) at 10/01/2021 0748 Last data filed at 10/01/2021 0600 Gross per 24 hour  Intake 500 ml  Output --  Net 500 ml   Filed Weights   09/26/21 0530 09/28/21 0417 09/29/21 0454  Weight: 59.3 kg 57.6 kg 56.9 kg    Examination:  General exam: Overall comfortable, not in distress, deconditioned, chronically looking Respiratory system: Diminished air sounds bilaterally but no wheezes or crackles Cardiovascular system: S1 & S2 heard, RRR.  Gastrointestinal system: Abdomen is nondistended, soft and nontender. Central nervous system: Alert and oriented Extremities: No edema, no clubbing ,no cyanosis Skin: No rashes, no ulcers,no icterus     Data Reviewed: I have personally reviewed following labs and imaging studies  CBC: Recent Labs  Lab 09/25/21 0241 09/26/21 0356 09/30/21 1410  WBC 30.7* 21.7* 22.0*  NEUTROABS  --   --  17.8*  HGB 8.5* 8.1* 8.5*  HCT 27.7* 25.5* 27.8*   MCV 90.2 90.1 89.7  PLT 456* 410* [id number]*   Basic Metabolic Panel: Recent Labs  Lab 09/25/21 0241 09/25/21 0630 09/26/21 0356 09/27/21 0356 09/28/21 0428 09/29/21 0441 09/30/21 2329  NA  --    < > 135 138 140 140 137  K  --    < > 4.1 3.9 3.7 3.8 3.2*  CL  --    < > 87* 88* 92* 95* 97*  CO2  --    < > 41* 43* 41* 38* 32  GLUCOSE  --    < > 170* 172* 189* 144* 108*  BUN  --    < > 25* 34* 31* 37* 23  CREATININE  --    < > 0.94 0.96 0.90 0.96 0.90  CALCIUM  --    < > 7.8* 8.1* 8.4* 8.7* 7.8*  MG 2.2  --  2.5* 2.3  --  2.3 2.1  PHOS  2.4*  --  3.0 3.5  --   --   --    < > = values in this interval not displayed.   GFR: Estimated Creatinine Clearance: 52.2 mL/min (by C-G formula based on SCr of 0.9 mg/dL). Liver Function Tests: No results for input(s): AST, ALT, ALKPHOS, BILITOT, PROT, ALBUMIN in the last 168 hours.  No results for input(s): LIPASE, AMYLASE in the last 168 hours. No results for input(s): AMMONIA in the last 168 hours. Coagulation Profile: No results for input(s): INR, PROTIME in the last 168 hours. Cardiac Enzymes: No results for input(s): CKTOTAL, CKMB, CKMBINDEX, TROPONINI in the last 168 hours. BNP (last 3 results) No results for input(s): PROBNP in the last 8760 hours. HbA1C: No results for input(s): HGBA1C in the last 72 hours. CBG: Recent Labs  Lab 09/29/21 2136 09/30/21 0731 09/30/21 1130 09/30/21 1617 09/30/21 2135  GLUCAP 125* 109* 76 76 133*   Lipid Profile: No results for input(s): CHOL, HDL, LDLCALC, TRIG, CHOLHDL, LDLDIRECT in the last 72 hours. Thyroid Function Tests: No results for input(s): TSH, T4TOTAL, FREET4, T3FREE, THYROIDAB in the last 72 hours. Anemia Panel: No results for input(s): VITAMINB12, FOLATE, FERRITIN, TIBC, IRON, RETICCTPCT in the last 72 hours. Sepsis Labs: Recent Labs  Lab 09/30/21 1410 10/01/21 0524  PROCALCITON <0.10 <0.10    Recent Results (from the past 240 hour(s))  Resp Panel by RT-PCR (Flu A&B,  Covid) Nasopharyngeal Swab     Status: None   Collection Time: 09/23/21 12:08 AM   Specimen: Nasopharyngeal Swab; Nasopharyngeal(NP) swabs in vial transport medium  Result Value Ref Range Status   SARS Coronavirus 2 by RT PCR NEGATIVE NEGATIVE Final    Comment: (NOTE) SARS-CoV-2 target nucleic acids are NOT DETECTED.  The SARS-CoV-2 RNA is generally detectable in upper respiratory specimens during the acute phase of infection. The lowest concentration of SARS-CoV-2 viral copies this assay can detect is 138 copies/mL. A negative result does not preclude SARS-Cov-2 infection and should not be used as the sole basis for treatment or other patient management decisions. A negative result may occur with  improper specimen collection/handling, submission of specimen other than nasopharyngeal swab, presence of viral mutation(s) within the areas targeted by this assay, and inadequate number of viral copies(<138 copies/mL). A negative result must be combined with clinical observations, patient history, and epidemiological information. The expected result is Negative.  Fact Sheet for Patients:  EntrepreneurPulse.com.au  Fact Sheet for Healthcare Providers:  IncredibleEmployment.be  This test is no t yet approved or cleared by the [place] FDA and  has been authorized for detection and/or diagnosis of SARS-CoV-2 by FDA under an Emergency Use Authorization (EUA). This EUA will remain  in effect (meaning this test can be used) for the duration of the COVID-19 declaration under Section 564(b)(1) of the Act, 21 U.S.C.section 360bbb-3(b)(1), unless the authorization is terminated  or revoked sooner.       Influenza A by PCR NEGATIVE NEGATIVE Final   Influenza B by PCR NEGATIVE NEGATIVE Final    Comment: (NOTE) The Xpert Xpress SARS-CoV-2/FLU/RSV plus assay is intended as an aid in the diagnosis of influenza from Nasopharyngeal swab specimens and should  not be used as a sole basis for treatment. Nasal washings and aspirates are unacceptable for Xpert Xpress SARS-CoV-2/FLU/RSV testing.  Fact Sheet for Patients: EntrepreneurPulse.com.au  Fact Sheet for Healthcare Providers: IncredibleEmployment.be  This test is not yet approved or cleared by the [place] FDA and has been authorized for detection and/or diagnosis  of SARS-CoV-2 by FDA under an Emergency Use Authorization (EUA). This EUA will remain in effect (meaning this test can be used) for the duration of the COVID-19 declaration under Section 564(b)(1) of the Act, 21 U.S.C. section 360bbb-3(b)(1), unless the authorization is terminated or revoked.  Performed at [facility], [address] [place], [place] [address]   MRSA Next Gen by PCR, Nasal     Status: None   Collection Time: 09/23/21  1:24 AM   Specimen: Nasal Mucosa; Nasal Swab  Result Value Ref Range Status   MRSA by PCR Next Gen NOT DETECTED NOT DETECTED Final    Comment: (NOTE) The GeneXpert MRSA Assay (FDA approved for NASAL specimens only), is one component of a comprehensive MRSA colonization surveillance program. It is not intended to diagnose MRSA infection nor to guide or monitor treatment for MRSA infections. Test performance is not FDA approved in patients less than 16 years old. Performed at [facility], [address], [place], [place] [address]   Culture, Respiratory w Gram Stain     Status: None   Collection Time: 09/24/21 12:22 PM   Specimen: Tracheal Aspirate; Respiratory  Result Value Ref Range Status   Specimen Description TRACHEAL ASPIRATE  Final   Special Requests NONE  Final   Gram Stain   Final    RARE WBC PRESENT,BOTH PMN AND MONONUCLEAR NO ORGANISMS SEEN    Culture   Final    NO GROWTH 2 DAYS Performed at [facility], [address] [place], [place] [address]    Report Status 09/26/2021 FINAL  Final         Radiology  Studies: CT Angio Chest Pulmonary Embolism (PE) W or WO Contrast  Result Date: 10/01/2021 CLINICAL DATA:  65 year old female with positive D-dimer and shortness of breath. Suspected pulmonary embolism. EXAM: CT ANGIOGRAPHY CHEST WITH CONTRAST TECHNIQUE: Multidetector CT imaging of the chest was performed using the standard protocol during bolus administration of intravenous contrast. Multiplanar CT image reconstructions and MIPs were obtained to evaluate the vascular anatomy. CONTRAST:  75mL OMNIPAQUE IOHEXOL 350 MG/ML SOLN COMPARISON:  Chest CTA 03/25/2020. FINDINGS: Cardiovascular: There are no filling defects within the pulmonary arterial tree to suggest pulmonary embolism. Heart size is mildly enlarged with some concentric left ventricular hypertrophy. There is no significant pericardial fluid, thickening or pericardial calcification. There is aortic atherosclerosis, as well as atherosclerosis of the great vessels of the mediastinum and the coronary arteries, including calcified atherosclerotic plaque in the left anterior descending, left circumflex and right coronary arteries. Status post median sternotomy for CABG including LIMA to the LAD. Right upper extremity PICC with tip terminating in the mid superior vena cava. Mediastinum/Nodes: No pathologically enlarged mediastinal or hilar lymph nodes. Moderate-sized hiatal hernia. No axillary lymphadenopathy. Lungs/Pleura: There are dependent opacities in the lower lobes of the lungs bilaterally, compatible with areas of subsegmental atelectasis. Small bilateral pleural effusions are noted. Some patchy areas of mild ground-glass attenuation are evident, most notably in the posterior aspect of the left upper lobe near the apex. No other confluent consolidative airspace disease. No pneumothorax. No definite suspicious appearing pulmonary nodules or masses are noted. Upper Abdomen: Unremarkable. Musculoskeletal: Median sternotomy wires. Status post left shoulder  arthroplasty. There are no aggressive appearing lytic or blastic lesions noted in the visualized portions of the skeleton. Review of the MIP images confirms the above findings. IMPRESSION: 1. No evidence of pulmonary embolism. 2. Cardiomegaly with small bilateral pleural effusions lying dependently and  some passive subsegmental atelectasis in the lower lobes of the lungs bilaterally. 3. Patchy areas of ground-glass attenuation in the lungs, most evident in the left upper lobe near the apex. These may be of infectious or inflammatory etiology. 4. Aortic atherosclerosis, in addition to three-vessel coronary artery disease. Status post median sternotomy for CABG including LIMA to the LAD. 5. Moderate-sized hiatal hernia. 6. Additional incidental findings, as above. Aortic Atherosclerosis (ICD10-I70.0). Electronically Signed   By: Vinnie Langton M.D.   On: 10/01/2021 06:44        Scheduled Meds:  arformoterol  15 mcg Nebulization BID   aspirin  81 mg Oral Daily   atorvastatin  80 mg Oral Daily   budesonide (PULMICORT) nebulizer solution  0.25 mg Nebulization BID   Chlorhexidine Gluconate Cloth  6 each Topical Daily   dapagliflozin propanediol  10 mg Oral Daily   feeding supplement  237 mL Oral BID BM   heparin  5,000 Units Subcutaneous Q8H   insulin aspart  0-15 Units Subcutaneous TID WC   insulin aspart  5 Units Subcutaneous TID WC   ipratropium-albuterol  3 mL Nebulization Q6H   mouth rinse  15 mL Mouth Rinse BID   metoprolol tartrate  25 mg Oral BID   nicotine  21 mg Transdermal Daily   oxyCODONE  5 mg Oral Q8H   pantoprazole  40 mg Oral BID   polyethylene glycol  17 g Oral Daily   potassium chloride  40 mEq Oral Q4H   QUEtiapine  25 mg Oral QHS   revefenacin  175 mcg Nebulization Daily   sodium chloride flush  10-40 mL Intracatheter Q12H   ticagrelor  90 mg Oral BID   Continuous Infusions:  sodium chloride Stopped (09/23/21 1753)     LOS: 8 days    Time spent:35 mins. More than  50% of that time was spent in counseling and/or coordination of care.      Shelly Coss, MD [facility] P12/15/2022, 7:48 AM

## 2021-10-01 NOTE — Evaluation (Signed)
Physical Therapy Evaluation Patient Details Name: [name] MRN: [id number] DOB: 06/03/1956 Today's Date: 10/01/2021  History of Present Illness  Pt is a 65 y.o. female who presented 09/22/21 with acute inferolateral STEMI. Pt was found unresponsive at home and intubated in the field. S/p PCI/emergent cardiac catheterization 12/6. Extubated 12/7. Re-intubated 12/7 - 12/11. During hospitalization, pt went into new onset afib. 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 PTA, she was independent without AD, living with her husband in a 2-level house with 12 STE. Since L shoulder arthroplasty 09/16/21, spouse has been providing light assist for ADLs due to precautions and pt still able to mobilize independently. Currently, pt displays deficits in lower extremity strength, coordination, balance, and activity tolerance. She is at risk for falls, displaying a narrow stanced gait pattern and resulting staggering, needing up to minA to recover during LOB bouts. Otherwise, pt is mobilizing without UE support or physical assistance. Pt reports tentative plan was to start OP therapy for shoulder next week - pt appropriate to begin OP therapies pending surgeon's plan for shoulder rehab at DC. Will continue to follow acutely.     Recommendations for follow up therapy are one component of a multi-disciplinary discharge planning process, led by the attending physician.  Recommendations may be updated based on patient status, additional functional criteria and insurance authorization.  Follow Up Recommendations Outpatient PT (follow physician's recs for s/p shoulder surgery)    Assistance Recommended at Discharge Intermittent Supervision/Assistance   Functional Status Assessment Patient has had a recent decline in their functional status and demonstrates the ability to make significant improvements in function in a reasonable and predictable amount of time.  Equipment Recommendations  None recommended by PT    Recommendations for Other Services       Precautions / Restrictions Precautions Precautions: Fall;Shoulder Type of Shoulder Precautions: No A/PROM at shoulder (Per Ortho: May use the arm for simple ADL's, hand to face activities) Shoulder Interventions: Shoulder sling/immobilizer;Off for dressing/bathing/exercises Precaution Comments: monitor HR and SpO2 Required Braces or Orthoses: Sling Restrictions Weight Bearing Restrictions: Yes LUE Weight Bearing: Non weight bearing Other Position/Activity Restrictions: Pillow behind shoulder seated and supine      Mobility  Bed Mobility Overal bed mobility: Modified Independent             General bed mobility comments: Pt able to transition supine > sit R EOB while maintaining NWB in L UE safely with HOB elevated.    Transfers Overall transfer level: Needs assistance Equipment used: None Transfers: Sit to/from Stand Sit to Stand: Supervision           General transfer comment: able to stand without assist    Ambulation/Gait Ambulation/Gait assistance: Min assist;+2 safety/equipment Gait Distance (Feet): 370 Feet Assistive device: None Gait Pattern/deviations: Step-through pattern;Decreased stride length;Scissoring;Narrow base of support;Staggering left;Staggering right Gait velocity: reduced Gait velocity interpretation: <1.8 ft/sec, indicate of risk for recurrent falls   General Gait Details: Pt with slow gait and narrow stance, intermittently mildly scissoring, especially when turning. Thus, pt staggers intermittently, needing up to minA to prevent LOB. SpO2 >/= 93% on 1L O2 when ambulating. +2 to manage lines  Stairs            Wheelchair Mobility     Modified  Rankin (Stroke Patients Only)       Balance Overall balance assessment: Needs assistance Sitting-balance support: No upper extremity supported;Feet supported Sitting balance-Leahy Scale: Good     Standing balance support: No upper extremity supported;During functional activity Standing balance-Leahy Scale: Fair Standing balance comment: fair static standing, able to mobilize without AD though requires Min A to correct approx 3 LOB                             Pertinent Vitals/Pain Pain Assessment: Faces Faces Pain Scale: Hurts little more Pain Location: L shoulder Pain Descriptors / Indicators: Guarding;Grimacing Pain Intervention(s): Limited activity within patient's tolerance;Monitored during session;Repositioned    Home Living Family/patient expects to be discharged to:: Private residence Living Arrangements: Spouse/significant other Available Help at Discharge: Family;Available 24 hours/day (husband returns to teach in January) Type of Home: House Home Access: Stairs to enter Entrance Stairs-Rails: Left Entrance Stairs-Number of Steps: 12   Home Layout: Two level;Able to live on main level with bedroom/bathroom Home Equipment: Shower seat;Cane - single point Additional Comments: Husband works part time as Doctor, general practice; off until January    Prior Function Prior Level of Function : Independent/Modified Independent             Mobility Comments: No AD to mobilize at baseline. x1 fall in past 6 months that broke her L arm ADLs Comments: Typically Independent with ADLs, IADLs. Husband has been assisting with ADLs since shoulder replacement 11/30     Hand Dominance   Dominant Hand: Right    Extremity/Trunk Assessment   Upper Extremity Assessment Upper Extremity Assessment: Defer to OT evaluation LUE Deficits / Details: Shoulder sx 11/30, AROM of hand/elbow WFL LUE: Unable to fully assess due to immobilization LUE Sensation: WNL LUE  Coordination: decreased gross motor    Lower Extremity Assessment Lower Extremity Assessment: Generalized weakness (noted functionally; reports difficulty with sensation in feet when ambulating)    Cervical / Trunk Assessment Cervical / Trunk Assessment: Normal  Communication   Communication: No difficulties  Cognition Arousal/Alertness: Awake/alert Behavior During Therapy: WFL for tasks assessed/performed Overall Cognitive Status: Within Functional Limits for tasks assessed                                 General Comments: aware of shoulder precautions but benefits from reminders. Pt very humerous, but sometimes jokes about rather than accepts she has deficits in balance.        General Comments General comments (skin integrity, edema, etc.): SpO2 88% on RA when ambulating but 93% at rest on RA, sustained 93% with 1 L O2 when ambulating; HR in 60s at rest, 90s when ambulating    Exercises     Assessment/Plan    PT Assessment Patient needs continued PT services  PT Problem List Decreased strength;Decreased range of motion;Decreased activity tolerance;Decreased coordination;Decreased mobility;Decreased balance;Cardiopulmonary status limiting activity       PT Treatment Interventions DME instruction;Gait training;Stair training;Functional mobility training;Therapeutic activities;Therapeutic exercise;Balance training;Neuromuscular re-education;Patient/family education    PT Goals (Current goals can be found in the Care Plan section)  Acute Rehab PT Goals Patient Stated Goal: to get better PT Goal Formulation: With patient Time For Goal Achievement: 10/15/21 Potential to Achieve Goals: Good    Frequency Min 3X/week   Barriers to discharge        Co-evaluation PT/OT/SLP Co-Evaluation/Treatment: Yes Reason for Co-Treatment: To address  functional/ADL transfers;For patient/therapist safety (unsure of pt's functional level as LOS has been 8 days) PT goals  addressed during session: Mobility/safety with mobility;Balance         AM-PAC PT "6 Clicks" Mobility  Outcome Measure Help needed turning from your back to your side while in a flat bed without using bedrails?: None Help needed moving from lying on your back to sitting on the side of a flat bed without using bedrails?: None Help needed moving to and from a bed to a chair (including a wheelchair)?: A Little Help needed standing up from a chair using your arms (e.g., wheelchair or bedside chair)?: A Little Help needed to walk in hospital room?: A Little Help needed climbing 3-5 steps with a railing? : A Little 6 Click Score: 20    End of Session Equipment Utilized During Treatment: Oxygen Activity Tolerance: Patient tolerated treatment well Patient left: in chair;with call bell/phone within reach;with chair alarm set Nurse Communication: Mobility status;Other (comment) (vitals) PT Visit Diagnosis: Unsteadiness on feet (R26.81);Other abnormalities of gait and mobility (R26.89);Muscle weakness (generalized) (M62.81);History of falling (Z91.81);Difficulty in walking, not elsewhere classified (R26.2)    Time: [id number] PT Time Calculation (min) (ACUTE ONLY): 32 min   Charges:   PT Evaluation $PT Eval Moderate Complexity: 1 Mod          Moishe Spice, PT, DPT Acute Rehabilitation Services  Pager: [phone] Office: [phone]   Orvan Falconer 10/01/2021, 2:21 PM

## 2021-10-01 NOTE — Progress Notes (Signed)
SATURATION QUALIFICATIONS: (This note is used to comply with regulatory documentation for home oxygen)  Patient Saturations on Room Air at Rest = 93%  Patient Saturations on Room Air while Ambulating = 88%  Patient Saturations on 1 Liters of oxygen while Ambulating = 93%  Please briefly explain why patient needs home oxygen: Pt requires 1L of supplemental O2 to maintain sats >/= 90% when ambulating.   Raymond Gurney, PT, DPT Acute Rehabilitation Services  Pager: [phone] Office: [phone]

## 2021-10-01 NOTE — Progress Notes (Signed)
PT Cancellation Note  Patient Details Name: [name] MRN: [id number] DOB: 1956/03/21   Cancelled Treatment:    Reason Eval/Treat Not Completed: Medical issues which prohibited therapy. Pt currently with a fib and HR up to 140s at rest. Per RN, starting on IV meds to address HR. Plan to check back in PM for PT eval pending medical appropriateness.   Raymond Gurney, PT, DPT Acute Rehabilitation Services  Pager: [phone] Office: [phone]   [name] 10/01/2021, 10:20 AM

## 2021-10-01 NOTE — Progress Notes (Signed)
Subjective:  Patient went into atrial fibrillation with rapid ventricular response last evening and has been persistent.  Patient feels slightly short of breath and also feels palpitations.  No chest pain. Intake/Output from previous day:  I/O last 3 completed shifts: In: 1010 [P.O.:960; I.V.:20; NG/GT:30] Out: -  Total I/O In: 41.5 [I.V.:41.5] Out: -   Blood pressure 132/65, pulse 79, temperature 98.8 F (37.1 C), temperature source Oral, resp. rate 17, height $RemoveBe'5\' 3"'QLgljgGqn$  (1.6 m), weight 56.9 kg, SpO2 98 %. Physical Exam Vitals reviewed.  Constitutional:      General: She is not in acute distress.    Appearance: She is ill-appearing.  HENT:     Head: Normocephalic.  Cardiovascular:     Rate and Rhythm: Tachycardia present. Rhythm irregularly irregular.     Pulses: Intact distal pulses.          Popliteal pulses are 0 on the right side and 0 on the left side.       Dorsalis pedis pulses are 0 on the right side and 0 on the left side.       Posterior tibial pulses are 0 on the right side and 0 on the left side.     Heart sounds: S1 normal and S2 normal. Heart sounds are distant. No murmur heard.   No gallop.  Pulmonary:     Effort: Pulmonary effort is normal. No respiratory distress.     Breath sounds: Wheezing (bilaterally) present.  Musculoskeletal:     Right lower leg: No edema.     Left lower leg: No edema.  Skin:    General: Skin is warm and dry.     Capillary Refill: Capillary refill takes less than 2 seconds.  Neurological:     Mental Status: She is alert and oriented to person, place, and time.    Lab Results: BMP BNP (last 3 results) Recent Labs    09/23/21 0613 09/24/21 0221 09/25/21 0241  BNP 1,323.2* 1,495.3* 1,291.5*     ProBNP (last 3 results) No results for input(s): PROBNP in the last 8760 hours. BMP Latest Ref Rng & Units 09/30/2021 09/29/2021 09/28/2021  Glucose 70 - 99 mg/dL 108(H) 144(H) 189(H)  BUN 8 - 23 mg/dL 23 37(H) 31(H)  Creatinine 0.44 -  1.00 mg/dL 0.90 0.96 0.90  Sodium 135 - 145 mmol/L 137 140 140  Potassium 3.5 - 5.1 mmol/L 3.2(L) 3.8 3.7  Chloride 98 - 111 mmol/L 97(L) 95(L) 92(L)  CO2 22 - 32 mmol/L 32 38(H) 41(H)  Calcium 8.9 - 10.3 mg/dL 7.8(L) 8.7(L) 8.4(L)   Hepatic Function Latest Ref Rng & Units 09/24/2021 03/29/2020 03/22/2020  Total Protein 6.5 - 8.1 g/dL 6.0(L) 6.8 8.0  Albumin 3.5 - 5.0 g/dL 2.7(L) 3.7 4.3  AST 15 - 41 U/L 53(H) 27 40  ALT 0 - 44 U/L 37 21 18  Alk Phosphatase 38 - 126 U/L 87 98 121  Total Bilirubin 0.3 - 1.2 mg/dL 0.6 1.0 0.6  Bilirubin, Direct 0.0 - 0.3 mg/dL - - -   CBC Latest Ref Rng & Units 09/30/2021 09/26/2021 09/25/2021  WBC 4.0 - 10.5 K/uL 22.0(H) 21.7(H) 30.7(H)  Hemoglobin 12.0 - 15.0 g/dL 8.5(L) 8.1(L) 8.5(L)  Hematocrit 36.0 - 46.0 % 27.8(L) 25.5(L) 27.7(L)  Platelets 150 - 400 K/uL 483(H) 410(H) 456(H)   Lipid Panel     Component Value Date/Time   CHOL 161 09/24/2021 0221   TRIG 121 09/24/2021 0221   HDL 27 (L) 09/24/2021 0221   CHOLHDL 6.0 09/24/2021  0221   VLDL 24 09/24/2021 0221   LDLCALC 110 (H) 09/24/2021 0221   LDLDIRECT 173.8 10/24/2009 1152   Cardiac Panel (last 3 results) No results for input(s): CKTOTAL, CKMB, TROPONINI, RELINDX in the last 72 hours.  HEMOGLOBIN A1C Lab Results  Component Value Date   HGBA1C 6.7 (H) 09/17/2021   MPG 146 09/17/2021   TSH Recent Labs    09/24/21 0221  TSH 0.177*    BNP (last 3 results) Recent Labs    09/23/21 0613 09/24/21 0221 09/25/21 0241  BNP 1,323.2* 1,495.3* 1,291.5*     ProBNP (last 3 results) No results for input(s): PROBNP in the last 8760 hours.  Imaging: Chest x-ray 1 view 09/25/2021: Status post coronary bypass graft. Stable cardiomediastinal silhouette. Endotracheal and feeding tubes are in good position. Status post left shoulder arthroplasty. Mild bibasilar atelectasis is noted  Cardiac Studies:  Echocardiogram 09/23/2021: 1. Left ventricular ejection fraction, by estimation, is 45 to  50%. Left  ventricular ejection fraction by PLAX is 54 %. The left ventricle has  normal function. The left ventricle demonstrates regional wall motion  abnormalities  Left ventricular diastolic parameters  are consistent with Grade III diastolic dysfunction (restrictive).  Elevated left ventricular end-diastolic pressure. There is mild  hypokinesis of the left ventricular, entire  inferoseptal wall, inferior wall and inferolateral wall.   2. Right ventricular systolic function is normal. The right ventricular  size is normal. There is moderately elevated pulmonary artery systolic pressure. The estimated right ventricular systolic pressure is 25.6 mmHg.   3. Left atrial size was mildly dilated.   4. Moderate to at most moderately severe posteriorly directed MR.  Consider papillary muscle dysfunction. The mitral valve is normal in  structure. Moderate to severe mitral valve regurgitation. No evidence of  mitral stenosis.   5. The aortic valve is normal in structure. Aortic valve regurgitation is not visualized. No aortic stenosis is present.   6. The inferior vena cava is dilated in size with <50% respiratory  variability, suggesting right atrial pressure of 15 mmHg.   Left Heart Catheterization 09/23/21:  RCA diffuse in the proximal and mid RCA with ulceration and scattered 80-99% stenosis.  Successful PCI to ulcerated high grade native RCA with implantation of 3.0x48 mm Synergy SD in the proximal to mid RCA. Timi 3 to 3 flow. LM diffuse 80% stenosis.  LAD patent with mild disease and LIMA to LAD patent.  CX: Moderate OM1 widely patent. Cx severely diseased after origin of OM 1 with 90-95% stenosis.  Small diseased OM-2 RI: Small with ostial mild disease.  SVG to OM1-2 skip graft occluded, SVG to RCA occluded.  LV: EF 35-40% with inferior akinesis. No MR.  Hemodynamics: LV 112/18, EDP 25 mm Hg. Ao 114/80, mean 112 mm Hg. No pressure gradient across the AV.  90 mL contrast used  Echocardiogram  03/24/2020:     1. Left ventricular ejection fraction, by estimation, is 55 to 60%. The  left ventricle has normal function. The left ventricle has no regional  wall motion abnormalities. There is moderate left ventricular hypertrophy.  Left ventricular diastolic parameters are consistent with Grade I diastolic dysfunction (impaired relaxation).   2. Right ventricular systolic function is normal. The right ventricular  size is normal. Tricuspid regurgitation signal is inadequate for assessing PA pressure.   3. The mitral valve is normal in structure. No evidence of mitral valve regurgitation. No evidence of mitral stenosis.   4. The aortic valve is normal in structure. Aortic  valve regurgitation is not visualized. No aortic stenosis is present.   5. The inferior vena cava is normal in size with greater than 50%  respiratory variability, suggesting right atrial pressure of 3 mmHg.   CABG 04/29/2016: LIMA to LAD, SVG to RCA, SVG to OM1, SVG to OM2 for left main 90% stenosis 50% stenosis in circumflex and 60% stenosis in OM1, 50% stenosis in proximal RCA.  EKG  EKG 09/30/2021: Atrial fibrillation with rapid ventricular response at rate of 130 bpm, normal axis, no evidence of ischemia.  Normal QT interval.  09/22/21 at 22:59 hours: Acute injury pattern in the inferior leads with reciprocal ST depression in lateral leads. Underlying Mobitz I AV Block.     03/29/2020: Sinus tachycardia at rate of 106 bpm, poor R wave progression, probably normal variant.  No evidence of ischemia.   Scheduled Meds:  arformoterol  15 mcg Nebulization BID   aspirin  81 mg Oral Daily   atorvastatin  80 mg Oral Daily   budesonide (PULMICORT) nebulizer solution  0.25 mg Nebulization BID   Chlorhexidine Gluconate Cloth  6 each Topical Daily   dapagliflozin propanediol  10 mg Oral Daily   feeding supplement  1 Container Oral TID BM   heparin  5,000 Units Subcutaneous Q8H   insulin aspart  0-15 Units Subcutaneous TID WC    insulin aspart  5 Units Subcutaneous TID WC   mouth rinse  15 mL Mouth Rinse BID   metoprolol tartrate  50 mg Oral BID   nicotine  21 mg Transdermal Daily   oxyCODONE  5 mg Oral Q8H   pantoprazole  40 mg Oral BID   polyethylene glycol  17 g Oral Daily   QUEtiapine  25 mg Oral QHS   revefenacin  175 mcg Nebulization Daily   sodium chloride flush  10-40 mL Intracatheter Q12H   ticagrelor  90 mg Oral BID   Continuous Infusions:  sodium chloride Stopped (09/23/21 1753)   diltiazem (CARDIZEM) infusion 7.5 mg/hr (10/01/21 1004)   PRN Meds:.acetaminophen, ipratropium-albuterol, oxyCODONE-acetaminophen **AND** oxyCODONE, promethazine, sodium chloride flush  Assessment/Plan:  Inferior lateral STEMI status post RCA stenting New onset atrial fibrillation with rapid ventricular response CHA2DS2-VASc Score is 3.  Yearly risk of stroke: 3.2% (F, Vasc Dz, HTN).  Score of 1=0.6; 2=2.2; 3=3.2; 4=4.8; 5=7.2; 6=9.8; 7=>9.8) -(CHF; HTN; vasc disease DM,  Female = 1; Age <65 =0; 65-74 = 1,  >75 =2; stroke/embolism= 2).  COPD exacerbation Tobacco use disorder  Recommendation: Continue IV diltiazem, will increase metoprolol to tartrate from 25 mg to 50 mg p.o. twice daily.  In view of cardioembolic risk of 6.0%, she will need to be on anticoagulation.  However if she converts to sinus rhythm and maintained sinus rhythm we could certainly discontinue anticoagulation.  In spite of A. fib with RVR, she has not had any ischemic EKG changes.  With regard to heart failure, diastolic heart failure is essentially resolved.  We will continue to follow.    Adrian Prows, MD, [facility] 10/01/2021, 5:12 PM Office: [phone] Fax: [phone] Pager: [phone]

## 2021-10-01 NOTE — Telephone Encounter (Signed)
Patient is still currently admitted in the hospital.

## 2021-10-02 ENCOUNTER — Telehealth: Payer: Self-pay | Admitting: Hematology and Oncology

## 2021-10-02 ENCOUNTER — Other Ambulatory Visit (HOSPITAL_COMMUNITY): Payer: Self-pay

## 2021-10-02 LAB — BASIC METABOLIC PANEL
Anion gap: 7 (ref 5–15)
BUN: 13 mg/dL (ref 8–23)
CO2: 28 mmol/L (ref 22–32)
Calcium: 8.1 mg/dL — ABNORMAL LOW (ref 8.9–10.3)
Chloride: 99 mmol/L (ref 98–111)
Creatinine, Ser: 0.91 mg/dL (ref 0.44–1.00)
GFR, Estimated: >60 mL/min (ref >60)
Glucose, Bld: 116 mg/dL — ABNORMAL HIGH (ref 70–99)
Potassium: 4.1 mmol/L (ref 3.5–5.1)
Sodium: 134 mmol/L — ABNORMAL LOW (ref 135–145)

## 2021-10-02 LAB — CBC WITH DIFFERENTIAL/PLATELET
Abs Immature Granulocytes: 0.2 10*3/uL — ABNORMAL HIGH (ref 0.00–0.07)
Basophils Absolute: 0.1 10*3/uL (ref 0.0–0.1)
Basophils Relative: 0 %
Eosinophils Absolute: 0.1 10*3/uL (ref 0.0–0.5)
Eosinophils Relative: 1 %
HCT: 27.6 % — ABNORMAL LOW (ref 36.0–46.0)
Hemoglobin: 8.4 g/dL — ABNORMAL LOW (ref 12.0–15.0)
Immature Granulocytes: 1 %
Lymphocytes Relative: 7 %
Lymphs Abs: 1.5 10*3/uL (ref 0.7–4.0)
MCH: 27.7 pg (ref 26.0–34.0)
MCHC: 30.4 g/dL (ref 30.0–36.0)
MCV: 91.1 fL (ref 80.0–100.0)
Monocytes Absolute: 2.2 10*3/uL — ABNORMAL HIGH (ref 0.1–1.0)
Monocytes Relative: 10 %
Neutro Abs: 18.1 10*3/uL — ABNORMAL HIGH (ref 1.7–7.7)
Neutrophils Relative %: 81 %
Platelets: 464 10*3/uL — ABNORMAL HIGH (ref 150–400)
RBC: 3.03 MIL/uL — ABNORMAL LOW (ref 3.87–5.11)
RDW: 15.4 % (ref 11.5–15.5)
WBC: 22 10*3/uL — ABNORMAL HIGH (ref 4.0–10.5)
nRBC: 0 % (ref 0.0–0.2)

## 2021-10-02 LAB — GLUCOSE, CAPILLARY
Glucose-Capillary: 129 mg/dL — ABNORMAL HIGH (ref 70–99)
Glucose-Capillary: 46 mg/dL — ABNORMAL LOW (ref 70–99)
Glucose-Capillary: 76 mg/dL (ref 70–99)

## 2021-10-02 LAB — PROCALCITONIN: Procalcitonin: 0.18 ng/mL

## 2021-10-02 MED ORDER — METOPROLOL TARTRATE 50 MG PO TABS: 50.0000 mg | ORAL_TABLET | Freq: Two times a day (BID) | ORAL | 0 refills | Status: DC

## 2021-10-02 MED ORDER — CLOPIDOGREL BISULFATE 75 MG PO TABS: 600.0000 mg | ORAL_TABLET | Freq: Once | ORAL | Status: DC

## 2021-10-02 MED ORDER — ATORVASTATIN CALCIUM 80 MG PO TABS: 80.0000 mg | ORAL_TABLET | Freq: Every day | ORAL | 0 refills | Status: DC

## 2021-10-02 MED ORDER — CLOPIDOGREL BISULFATE 75 MG PO TABS: 75.0000 mg | ORAL_TABLET | Freq: Every day | ORAL | Status: DC

## 2021-10-02 MED ORDER — DAPAGLIFLOZIN PROPANEDIOL 10 MG PO TABS
10.0000 mg | ORAL_TABLET | Freq: Every day | ORAL | 0 refills | Status: DC
  Filled 2021-10-02: qty 30, 30d supply, fill #0

## 2021-10-02 MED ORDER — DAPAGLIFLOZIN PROPANEDIOL 10 MG PO TABS: 10.0000 mg | ORAL_TABLET | Freq: Every day | ORAL | 0 refills | Status: DC

## 2021-10-02 MED ORDER — DILTIAZEM HCL ER COATED BEADS 120 MG PO CP24
120.0000 mg | ORAL_CAPSULE | Freq: Every day | ORAL | Status: DC
  Administered 2021-10-02: 120 mg via ORAL
  Filled 2021-10-02: qty 1

## 2021-10-02 MED ORDER — PANTOPRAZOLE SODIUM 40 MG PO TBEC: 40.0000 mg | DELAYED_RELEASE_TABLET | Freq: Two times a day (BID) | ORAL | 0 refills | Status: DC

## 2021-10-02 MED ORDER — DILTIAZEM HCL ER COATED BEADS 120 MG PO CP24: 120.0000 mg | ORAL_CAPSULE | Freq: Every day | ORAL | 0 refills | Status: DC

## 2021-10-02 MED ORDER — NICOTINE 21 MG/24HR TD PT24
21.0000 mg | MEDICATED_PATCH | Freq: Every day | TRANSDERMAL | 0 refills | Status: DC
Start: 2021-10-03 — End: 2021-11-20

## 2021-10-02 MED ORDER — APIXABAN 5 MG PO TABS
5.0000 mg | ORAL_TABLET | Freq: Two times a day (BID) | ORAL | 0 refills | Status: DC
  Filled 2021-10-02: qty 60, 30d supply, fill #0

## 2021-10-02 MED ORDER — CLOPIDOGREL BISULFATE 75 MG PO TABS: 75.0000 mg | ORAL_TABLET | Freq: Every day | ORAL | 0 refills | Status: DC

## 2021-10-02 MED ORDER — ASPIRIN 81 MG PO CHEW: 81.0000 mg | CHEWABLE_TABLET | Freq: Every day | ORAL | 0 refills | Status: DC

## 2021-10-02 MED ORDER — APIXABAN 5 MG PO TABS: 5.0000 mg | ORAL_TABLET | Freq: Two times a day (BID) | ORAL | 0 refills | Status: DC

## 2021-10-02 NOTE — Progress Notes (Signed)
Prior to removal of PICC line, the patient and the RN were informed of the procedure. Patient was placed in supine position on bed with HOB flat. The RUA PICC line site is WNL with bruising noted superior to the insertion site, the dressing is clean,dry, and intact. The patient was instructed to hold her breath during line removal and was able to follow insturctions. The line measured 34 cm in length. Pressure was applied to the insertion site with Vaseline gauze, 2x2 gauze for 5 minutes then covered with an occlusive Tegaderm dressing. No bleeding noted. Line was removed with difficulty, no resistance was noted during removal. Patient and the RN were instructed that the patient is to remain in bed with HOB flat for 30 minutes following line removal. Patient was instructed to leave dressing in place for 24 hours and that the site should not be immersed for 7 days. She was instructed to monitor for bleeding,swelling, s/s infection and report any concerns to the RN prior to DC and call her doctor for any concerns when she gets home. The patient denies any c/o discomfort. No bleeding noted at this time.

## 2021-10-02 NOTE — Progress Notes (Signed)
° °  Subjective: 10 Days Post-Op Procedure(s) (LRB): Coronary/Graft Acute MI Revascularization (N/A) LEFT HEART CATH AND CORONARY ANGIOGRAPHY (N/A)  Recheck left shoulder 2 weeks s/p reverse total shoulder Pt c/o mild to moderate soreness in the shoulder Missed outpt appointment due to recent heart attack Denies any numbness or tingling distally  Patient reports pain as moderate.  Objective:   VITALS:   Vitals:   10/02/21 0826 10/02/21 1113  BP: (!) 137/55 118/88  Pulse: 85 77  Resp: 17 17  Temp: 99.3 F (37.4 C)   SpO2:      Left shoulder: incision healed well Staples removed and dressing reapplied Nv intact distally No rashes or edema distally  Guarded rom   LABS Recent Labs    09/30/21 1410 10/02/21 0449  HGB 8.5* 8.4*  HCT 27.8* 27.6*  WBC 22.0* 22.0*  PLT 483* 464*    Recent Labs    09/30/21 2329 10/02/21 0449  NA 137 134*  K 3.2* 4.1  BUN 23 13  CREATININE 0.90 0.91  GLUCOSE 108* 116*     Assessment/Plan: 10 Days Post-Op Procedure(s) (LRB): Coronary/Graft Acute MI Revascularization (N/A) LEFT HEART CATH AND CORONARY ANGIOGRAPHY (N/A) Patient may get the incision wet F/u in the office in 4 weeks Gentle activity with left upper extremity without heavy lifting Pain management Pt in agreement Stable from orthopedic standpoint for d/c home per medical team   Alphonsa Overall PA-C, MPAS [facility] is now [facility]   [place] [address], [address], [place], [place] [address] Phone: [phone] [email or web address]   [employer]

## 2021-10-02 NOTE — Telephone Encounter (Signed)
Scheduled appt per 12/16 staff msg from Dr. Leonides Schanz. Pt is aware of appt date and time.

## 2021-10-02 NOTE — Progress Notes (Signed)
PROGRESS NOTE    MACII ARD  [id number] DOB: 02-10-1956 DOA: 09/23/2021 PCP: Patient, No Pcp Per (Inactive)   Chief Complain: Unresponsive  Brief Narrative: Patient is a 65 female with history of C. difficile colitis, hyperlipidemia, anxiety/depression, tobacco use, peripheral vascular disease, COPD, hypertension, coronary artery disease who presented to [facility] ED on 12/11 after she was found to be unresponsive at home.  She was brought to the emergency department intubated by EMS.  She was found to have inferolateral STEMI and was transferred to [facility] for emergent PCI.  She was taken to Cath Lab and had stenting of RCA.  She was on PCCM service.  Transferred to our service on 12/14..  Currently on 2 L of oxygen per minute. In the morning of 12/15, she went into A. fib with RVR .  Cardiology notified.Started on cardizem drip  Important events:  12/7 admitted with inferolateral STEMI, taken for PCI of RCA, extubated, reintubated for respiratory failure 12/11 Extubated required precedex for overnight agitation 12/12 agitation requiring Precedex and lorazepam 12/13 extubated    Assessment & Plan:   Principal Problem:   Acute ST elevation myocardial infarction (STEMI) of inferior wall (HCC) Active Problems:   Coronary artery disease involving native coronary artery of native heart with unstable angina pectoris (HCC)   Acute ST elevation myocardial infarction (STEMI) involving other coronary artery of inferior wall (HCC)   Acute diastolic heart failure ([place])   Hypoxia   Acute STEMI: Found to be unresponsive.  Underwent urgent PCI to RCA.  Cardiology recommend outpatient follow-up.  Continue aspirin , Brilinta,lipitor, metoprolol  Afib with RVR: New onset. She went into rapid A. fib on 12/14.  Started on Cardizem drip.  Anticoagulation not started yet as per cardiology.  Dr. Einar Gip following.  We will stop the IV Cardizem, plan for anticoagulation as per  cardiology.  Acute on chronic hypoxic/hypercarbic respiratory failure due to left lower lobe pneumonia/acute COPD exacerbation: Had be intubated on presentation for airway protection.  PCCM following. Continue bronchodilators. Previous chest x-ray had some infiltrate on left mid and left lower lungs.  Treated with 5 days of antibiotics. CT angio did not show any PE but showed bilateral small pleural effusion, atelectasis, groundglass attenuation in the upper lobes. On 1-2 L of oxygen.  She qualified for home oxygen, will be discharged on 1L of oxygen  Dysphagia: Speech therapy following.  Recommended dysphagia 2 diet.Feeding tube removed.She was found to have significant reflux.On protonix.  Needs GI evaluation as an outpatient.  Reconsulted speech for follow-up.  Delirium: Needed Precedex drip in ICU.  Monitor mental status.  Currently alert and oriented.  Moderate to severe mitral regurgitation: Concern for papillary muscle dysfunction secondary to ischemia.  Follow-up with cardiology as an outpatient.  Leukocytosis: On reviewing her previous lab works, her WBC is chronically elevated more than 20 K.  We recommend to follow-up with hematology as an outpatient.We will try to obtain an appointment  Diabetes type 2: Continue sliding scale insulin and NovoLog.  Hemoglobin A1c of 6.7. Started on farxiga  Recent history of left shoulder/humerus fracture: Status post left reverse shoulder r arthroplasty on 11/30.orthopedics recommending wearing the sling all the time, nonweightbearing. we will recommend to follow-up with orthopedics as an outpatient.  Low TSH: Likely euthyroid sick syndrome with normal free T4  Debility/deconditioning: PT/OT consulted today: Recommended outpatient follow-up.  Nutrition Problem: Inadequate oral intake Etiology: acute illness      DVT prophylaxis: Heparin subcu Code Status: Full Family Communication:  None at the bedside Patient status:  Dispo: The patient  is from: Home              Anticipated d/c is to: Home              Anticipated d/c date is: in next 1 to 2 days.Heart rate needs to be stable before dc  Consultants: PCCM,cardiology  Procedures:PCI  Antimicrobials:  Anti-infectives (From admission, onward)    Start     Dose/Rate Route Frequency Ordered Stop   09/26/21 1200  levofloxacin (LEVAQUIN) IVPB 750 mg        750 mg 100 mL/hr over 90 Minutes Intravenous Every 24 hours 09/26/21 0953 09/28/21 1246   09/24/21 1145  aztreonam (AZACTAM) 1 g in sodium chloride 0.9 % 100 mL IVPB  Status:  Discontinued        1 g 200 mL/hr over 30 Minutes Intravenous Every 8 hours 09/24/21 1046 09/26/21 0953   09/23/21 1800  azithromycin (ZITHROMAX) 250 mg in dextrose 5 % 125 mL IVPB  Status:  Discontinued        250 mg 127.5 mL/hr over 60 Minutes Intravenous Every 24 hours 09/23/21 1706 09/26/21 0953   09/23/21 0104  vancomycin (VANCOREADY) IVPB 500 mg/100 mL        over 60 Minutes  Continuous PRN 09/23/21 0104 09/23/21 0104       Subjective:  Patient seen and examined bedside this morning.  Denies any complaints.  She went into A. fib with RVR this morning.  Heart rate was in the range of 120.  Denies any chest pain shortness of breath or palpitation.  Objective: Vitals:   10/01/21 1405 10/01/21 1856 10/01/21 1951 10/02/21 0452  BP: 132/65 131/64 125/60 (!) 146/56  Pulse: 79 79  80  Resp: 17  15 16   Temp: 98.8 F (37.1 C)  98.4 F (36.9 C) 98 F (36.7 C)  TempSrc: Oral  Oral Oral  SpO2: 98%  97% 91%  Weight:      Height:        Intake/Output Summary (Last 24 hours) at 10/02/2021 0805 Last data filed at 10/01/2021 1500 Gross per 24 hour  Intake 41.49 ml  Output --  Net 41.49 ml   Filed Weights   09/26/21 0530 09/28/21 0417 09/29/21 0454  Weight: 59.3 kg 57.6 kg 56.9 kg    Examination:  General exam: Overall comfortable, not in distress, deconditioned, chronically looking Respiratory system: Diminished air sounds  bilaterally but no wheezes or crackles Cardiovascular system: S1 & S2 heard, RRR.  Gastrointestinal system: Abdomen is nondistended, soft and nontender. Central nervous system: Alert and oriented Extremities: No edema, no clubbing ,no cyanosis Skin: No rashes, no ulcers,no icterus     Data Reviewed: I have personally reviewed following labs and imaging studies  CBC: Recent Labs  Lab 09/26/21 0356 09/30/21 1410 10/02/21 0449  WBC 21.7* 22.0* 22.0*  NEUTROABS  --  17.8* 18.1*  HGB 8.1* 8.5* 8.4*  HCT 25.5* 27.8* 27.6*  MCV 90.1 89.7 91.1  PLT 410* 483* [id number]*   Basic Metabolic Panel: Recent Labs  Lab 09/26/21 0356 09/27/21 0356 09/28/21 0428 09/29/21 0441 09/30/21 2329 10/02/21 0449  NA 135 138 140 140 137 134*  K 4.1 3.9 3.7 3.8 3.2* 4.1  CL 87* 88* 92* 95* 97* 99  CO2 41* 43* 41* 38* 32 28  GLUCOSE 170* 172* 189* 144* 108* 116*  BUN 25* 34* 31* 37* 23 13  CREATININE 0.94 0.96 0.90 0.96 0.90  0.91  CALCIUM 7.8* 8.1* 8.4* 8.7* 7.8* 8.1*  MG 2.5* 2.3  --  2.3 2.1  --   PHOS 3.0 3.5  --   --   --   --    GFR: Estimated Creatinine Clearance: 51.7 mL/min (by C-G formula based on SCr of 0.91 mg/dL). Liver Function Tests: No results for input(s): AST, ALT, ALKPHOS, BILITOT, PROT, ALBUMIN in the last 168 hours.  No results for input(s): LIPASE, AMYLASE in the last 168 hours. No results for input(s): AMMONIA in the last 168 hours. Coagulation Profile: No results for input(s): INR, PROTIME in the last 168 hours. Cardiac Enzymes: No results for input(s): CKTOTAL, CKMB, CKMBINDEX, TROPONINI in the last 168 hours. BNP (last 3 results) No results for input(s): PROBNP in the last 8760 hours. HbA1C: No results for input(s): HGBA1C in the last 72 hours. CBG: Recent Labs  Lab 09/30/21 1617 09/30/21 2135 10/01/21 0813 10/01/21 1237 10/01/21 1534  GLUCAP 76 133* 103* 105* 102*   Lipid Profile: No results for input(s): CHOL, HDL, LDLCALC, TRIG, CHOLHDL, LDLDIRECT in the  last 72 hours. Thyroid Function Tests: No results for input(s): TSH, T4TOTAL, FREET4, T3FREE, THYROIDAB in the last 72 hours. Anemia Panel: No results for input(s): VITAMINB12, FOLATE, FERRITIN, TIBC, IRON, RETICCTPCT in the last 72 hours. Sepsis Labs: Recent Labs  Lab 09/30/21 1410 10/01/21 0524 10/02/21 0449  PROCALCITON <0.10 <0.10 0.18    Recent Results (from the past 240 hour(s))  Resp Panel by RT-PCR (Flu A&B, Covid) Nasopharyngeal Swab     Status: None   Collection Time: 09/23/21 12:08 AM   Specimen: Nasopharyngeal Swab; Nasopharyngeal(NP) swabs in vial transport medium  Result Value Ref Range Status   SARS Coronavirus 2 by RT PCR NEGATIVE NEGATIVE Final    Comment: (NOTE) SARS-CoV-2 target nucleic acids are NOT DETECTED.  The SARS-CoV-2 RNA is generally detectable in upper respiratory specimens during the acute phase of infection. The lowest concentration of SARS-CoV-2 viral copies this assay can detect is 138 copies/mL. A negative result does not preclude SARS-Cov-2 infection and should not be used as the sole basis for treatment or other patient management decisions. A negative result may occur with  improper specimen collection/handling, submission of specimen other than nasopharyngeal swab, presence of viral mutation(s) within the areas targeted by this assay, and inadequate number of viral copies(<138 copies/mL). A negative result must be combined with clinical observations, patient history, and epidemiological information. The expected result is Negative.  Fact Sheet for Patients:  EntrepreneurPulse.com.au  Fact Sheet for Healthcare Providers:  IncredibleEmployment.be  This test is no t yet approved or cleared by the [place] FDA and  has been authorized for detection and/or diagnosis of SARS-CoV-2 by FDA under an Emergency Use Authorization (EUA). This EUA will remain  in effect (meaning this test can be used) for the  duration of the COVID-19 declaration under Section 564(b)(1) of the Act, 21 U.S.C.section 360bbb-3(b)(1), unless the authorization is terminated  or revoked sooner.       Influenza A by PCR NEGATIVE NEGATIVE Final   Influenza B by PCR NEGATIVE NEGATIVE Final    Comment: (NOTE) The Xpert Xpress SARS-CoV-2/FLU/RSV plus assay is intended as an aid in the diagnosis of influenza from Nasopharyngeal swab specimens and should not be used as a sole basis for treatment. Nasal washings and aspirates are unacceptable for Xpert Xpress SARS-CoV-2/FLU/RSV testing.  Fact Sheet for Patients: EntrepreneurPulse.com.au  Fact Sheet for Healthcare Providers: IncredibleEmployment.be  This test is not yet approved or  cleared by the [place] and has been authorized for detection and/or diagnosis of SARS-CoV-2 by FDA under an Emergency Use Authorization (EUA). This EUA will remain in effect (meaning this test can be used) for the duration of the COVID-19 declaration under Section 564(b)(1) of the Act, 21 U.S.C. section 360bbb-3(b)(1), unless the authorization is terminated or revoked.  Performed at [facility], [address] [place], [place] [address]   MRSA Next Gen by PCR, Nasal     Status: None   Collection Time: 09/23/21  1:24 AM   Specimen: Nasal Mucosa; Nasal Swab  Result Value Ref Range Status   MRSA by PCR Next Gen NOT DETECTED NOT DETECTED Final    Comment: (NOTE) The GeneXpert MRSA Assay (FDA approved for NASAL specimens only), is one component of a comprehensive MRSA colonization surveillance program. It is not intended to diagnose MRSA infection nor to guide or monitor treatment for MRSA infections. Test performance is not FDA approved in patients less than 30 years old. Performed at [facility], [address] [place], [place] [address]   Culture, Respiratory w Gram Stain     Status: None   Collection Time: 09/24/21  12:22 PM   Specimen: Tracheal Aspirate; Respiratory  Result Value Ref Range Status   Specimen Description TRACHEAL ASPIRATE  Final   Special Requests NONE  Final   Gram Stain   Final    RARE WBC PRESENT,BOTH PMN AND MONONUCLEAR NO ORGANISMS SEEN    Culture   Final    NO GROWTH 2 DAYS Performed at [facility], [address] [place], [place] [address]    Report Status 09/26/2021 FINAL  Final         Radiology Studies: CT Angio Chest Pulmonary Embolism (PE) W or WO Contrast  Result Date: 10/01/2021 CLINICAL DATA:  65 year old female with positive D-dimer and shortness of breath. Suspected pulmonary embolism. EXAM: CT ANGIOGRAPHY CHEST WITH CONTRAST TECHNIQUE: Multidetector CT imaging of the chest was performed using the standard protocol during bolus administration of intravenous contrast. Multiplanar CT image reconstructions and MIPs were obtained to evaluate the vascular anatomy. CONTRAST:  63mL OMNIPAQUE IOHEXOL 350 MG/ML SOLN COMPARISON:  Chest CTA 03/25/2020. FINDINGS: Cardiovascular: There are no filling defects within the pulmonary arterial tree to suggest pulmonary embolism. Heart size is mildly enlarged with some concentric left ventricular hypertrophy. There is no significant pericardial fluid, thickening or pericardial calcification. There is aortic atherosclerosis, as well as atherosclerosis of the great vessels of the mediastinum and the coronary arteries, including calcified atherosclerotic plaque in the left anterior descending, left circumflex and right coronary arteries. Status post median sternotomy for CABG including LIMA to the LAD. Right upper extremity PICC with tip terminating in the mid superior vena cava. Mediastinum/Nodes: No pathologically enlarged mediastinal or hilar lymph nodes. Moderate-sized hiatal hernia. No axillary lymphadenopathy. Lungs/Pleura: There are dependent opacities in the lower lobes of the lungs bilaterally, compatible with areas of  subsegmental atelectasis. Small bilateral pleural effusions are noted. Some patchy areas of mild ground-glass attenuation are evident, most notably in the posterior aspect of the left upper lobe near the apex. No other confluent consolidative airspace disease. No pneumothorax. No definite suspicious appearing pulmonary nodules or masses are noted. Upper Abdomen: Unremarkable. Musculoskeletal: Median sternotomy wires. Status post left shoulder arthroplasty. There are no aggressive appearing lytic or blastic lesions noted in the visualized portions of the skeleton. Review of the MIP images confirms the above findings. IMPRESSION: 1. No  evidence of pulmonary embolism. 2. Cardiomegaly with small bilateral pleural effusions lying dependently and some passive subsegmental atelectasis in the lower lobes of the lungs bilaterally. 3. Patchy areas of ground-glass attenuation in the lungs, most evident in the left upper lobe near the apex. These may be of infectious or inflammatory etiology. 4. Aortic atherosclerosis, in addition to three-vessel coronary artery disease. Status post median sternotomy for CABG including LIMA to the LAD. 5. Moderate-sized hiatal hernia. 6. Additional incidental findings, as above. Aortic Atherosclerosis (ICD10-I70.0). Electronically Signed   By: Vinnie Langton M.D.   On: 10/01/2021 06:44        Scheduled Meds:  apixaban  5 mg Oral BID   arformoterol  15 mcg Nebulization BID   aspirin  81 mg Oral Daily   atorvastatin  80 mg Oral Daily   budesonide (PULMICORT) nebulizer solution  0.25 mg Nebulization BID   Chlorhexidine Gluconate Cloth  6 each Topical Daily   dapagliflozin propanediol  10 mg Oral Daily   feeding supplement  1 Container Oral TID BM   insulin aspart  0-15 Units Subcutaneous TID WC   insulin aspart  5 Units Subcutaneous TID WC   mouth rinse  15 mL Mouth Rinse BID   metoprolol tartrate  50 mg Oral BID   nicotine  21 mg Transdermal Daily   oxyCODONE  5 mg Oral Q8H    pantoprazole  40 mg Oral BID   polyethylene glycol  17 g Oral Daily   QUEtiapine  25 mg Oral QHS   revefenacin  175 mcg Nebulization Daily   sodium chloride flush  10-40 mL Intracatheter Q12H   ticagrelor  90 mg Oral BID   Continuous Infusions:  sodium chloride Stopped (09/23/21 1753)   diltiazem (CARDIZEM) infusion 7.5 mg/hr (10/01/21 2048)     LOS: 9 days    Time spent:35 mins. More than 50% of that time was spent in counseling and/or coordination of care.      Shelly Coss, MD [facility] P12/16/2022, 8:05 AM

## 2021-10-02 NOTE — Telephone Encounter (Signed)
I have made an appointment per the provider request and it has been made with provider as patient is still in the hospital. She can call to change if needed.

## 2021-10-02 NOTE — Progress Notes (Addendum)
Physical Therapy Treatment Patient Details Name: [name] MRN: [id number] DOB: 12-19-1955 Today's Date: 10/02/2021   History of Present Illness Pt is a 65 y.o. female who presented 09/22/21 with acute inferolateral STEMI. Pt was found unresponsive at home and intubated in the field. S/p PCI/emergent cardiac catheterization 12/6. Extubated 12/7. Re-intubated 12/7 - 12/11. During hospitalization, pt went into new onset afib. PMH: CAD S/P CABG X 4, Hypertension, hyperlipidemia, DM, COPD secondary to prior tobacco use disorder, diverticulosis, right renal artery stenosis by CTA in 2017, L shoulder arthroplasty 09/16/21    PT Comments    Pt progressing with mobility. From PT standpoint ready for dc home with husband.  Pt had asked cardiac rehab about walker for home use. Explained to pt that walker not beneficial since she can not use LUE to hold or weight bear on walker. Pt verbalized understanding. Pt has cane at home and declined using cane today after a few steps.   Recommendations for follow up therapy are one component of a multi-disciplinary discharge planning process, led by the attending physician.  Recommendations may be updated based on patient status, additional functional criteria and insurance authorization.  Follow Up Recommendations  Outpatient PT (per ortho for shoulder when appropriate)     Assistance Recommended at Discharge Intermittent Supervision/Assistance  Equipment Recommendations  None recommended by PT    Recommendations for Other Services       Precautions / Restrictions Precautions Precautions: Fall;Shoulder Type of Shoulder Precautions: No A/PROM at shoulder (Per ortho may use the arm for simple ADL's, hand to face activities) Shoulder Interventions: Shoulder sling/immobilizer;Off for dressing/bathing/exercises Required Braces or Orthoses: Sling Restrictions LUE Weight Bearing: Non weight bearing Other Position/Activity Restrictions: Pillow behind  shoulder seated and supine     Mobility  Bed Mobility Overal bed mobility: Modified Independent                  Transfers Overall transfer level: Modified independent Equipment used: None Transfers: Sit to/from Stand Sit to Stand: Modified independent (Device/Increase time)                Ambulation/Gait Ambulation/Gait assistance: Min guard Gait Distance (Feet): 75 Feet Assistive device: Straight cane;None Gait Pattern/deviations: Step-through pattern;Decreased stride length Gait velocity: decr Gait velocity interpretation: 1.31 - 2.62 ft/sec, indicative of limited community ambulator   General Gait Details: assist for safety but no loss of balance or staggering. Used cane for a few steps but then declined further use.   Stairs Stairs: Yes Stairs assistance: Min guard Stair Management: With cane;Forwards;Step to pattern Number of Stairs: 1 General stair comments: Assist for safety   Wheelchair Mobility    Modified Rankin (Stroke Patients Only)       Balance Overall balance assessment: Needs assistance Sitting-balance support: No upper extremity supported;Feet supported Sitting balance-Leahy Scale: Good     Standing balance support: No upper extremity supported;During functional activity Standing balance-Leahy Scale: Good                              Cognition Arousal/Alertness: Awake/alert Behavior During Therapy: WFL for tasks assessed/performed Overall Cognitive Status: Within Functional Limits for tasks assessed                                          Exercises      General Comments General comments (  skin integrity, edema, etc.): SpO2 93% on RA after ambulation      Pertinent Vitals/Pain Pain Assessment: No/denies pain    Home Living                          Prior Function            PT Goals (current goals can now be found in the care plan section) Acute Rehab PT Goals Patient  Stated Goal: to get better Progress towards PT goals: Progressing toward goals    Frequency    Min 3X/week      PT Plan Current plan remains appropriate    Co-evaluation              AM-PAC PT "6 Clicks" Mobility   Outcome Measure  Help needed turning from your back to your side while in a flat bed without using bedrails?: None Help needed moving from lying on your back to sitting on the side of a flat bed without using bedrails?: None Help needed moving to and from a bed to a chair (including a wheelchair)?: None Help needed standing up from a chair using your arms (e.g., wheelchair or bedside chair)?: None Help needed to walk in hospital room?: A Little Help needed climbing 3-5 steps with a railing? : A Little 6 Click Score: 22    End of Session Equipment Utilized During Treatment: Other (comment) (sling) Activity Tolerance: Patient tolerated treatment well Patient left: in chair;with call bell/phone within reach;with family/visitor present   PT Visit Diagnosis: Other abnormalities of gait and mobility (R26.89);History of falling (Z91.81)     Time: 4332-9518 PT Time Calculation (min) (ACUTE ONLY): 15 min  Charges:  $Gait Training: 8-22 mins                     [facility] PT Acute Rehabilitation Services Pager [phone] Office [phone]    Angelina Ok [facility] 10/02/2021, 11:53 AM

## 2021-10-02 NOTE — Progress Notes (Signed)
SATURATION QUALIFICATIONS: (This note is used to comply with regulatory documentation for home oxygen)  Patient Saturations on Room Air at Rest = 94%  Patient Saturations on Room Air while Ambulating = 93%  Patient Saturations on N/A Liters of oxygen while Ambulating = N/A%  Please briefly explain why patient needs home oxygen:Did not require today.  Georga Hacking [facility] PT Acute Rehabilitation Services Pager [phone] Office [phone]

## 2021-10-02 NOTE — Progress Notes (Signed)
Subjective:  Patient is presently doing well, states that her dyspnea has improved.  She is motivated to quit smoking.  No chest pain.  No further palpitations. Intake/Output from previous day:  I/O last 3 completed shifts: In: 521.5 [P.O.:480; I.V.:41.5] Out: -  No intake/output data recorded.  Blood pressure (!) 146/56, pulse 80, temperature 98 F (36.7 C), temperature source Oral, resp. rate 16, height $RemoveBe'5\' 3"'[id number]$  (1.6 m), weight 56.9 kg, SpO2 94 %. Physical Exam Vitals reviewed.  Constitutional:      General: She is not in acute distress.    Appearance: She is ill-appearing.  HENT:     Head: Normocephalic.  Cardiovascular:     Rate and Rhythm: Normal rate and regular rhythm.     Pulses: Intact distal pulses.          Popliteal pulses are 0 on the right side and 0 on the left side.       Dorsalis pedis pulses are 0 on the right side and 0 on the left side.       Posterior tibial pulses are 0 on the right side and 0 on the left side.     Heart sounds: S1 normal and S2 normal. Heart sounds are distant. No murmur heard.   No gallop.  Pulmonary:     Effort: Pulmonary effort is normal. No respiratory distress.     Breath sounds: Wheezing (bilaterally, much improved since yesterday) present.  Musculoskeletal:     Right lower leg: No edema.     Left lower leg: No edema.  Skin:    General: Skin is warm and dry.     Capillary Refill: Capillary refill takes less than 2 seconds.  Neurological:     Mental Status: She is alert and oriented to person, place, and time.    Lab Results: BMP BNP (last 3 results) Recent Labs    09/23/21 0613 09/24/21 0221 09/25/21 0241  BNP 1,323.2* 1,495.3* 1,291.5*     ProBNP (last 3 results) No results for input(s): PROBNP in the last 8760 hours. BMP Latest Ref Rng & Units 10/02/2021 09/30/2021 09/29/2021  Glucose 70 - 99 mg/dL 116(H) 108(H) 144(H)  BUN 8 - 23 mg/dL 13 23 37(H)  Creatinine 0.44 - 1.00 mg/dL 0.91 0.90 0.96  Sodium 135 - 145  mmol/L 134(L) 137 140  Potassium 3.5 - 5.1 mmol/L 4.1 3.2(L) 3.8  Chloride 98 - 111 mmol/L 99 97(L) 95(L)  CO2 22 - 32 mmol/L 28 32 38(H)  Calcium 8.9 - 10.3 mg/dL 8.1(L) 7.8(L) 8.7(L)   Hepatic Function Latest Ref Rng & Units 09/24/2021 03/29/2020 03/22/2020  Total Protein 6.5 - 8.1 g/dL 6.0(L) 6.8 8.0  Albumin 3.5 - 5.0 g/dL 2.7(L) 3.7 4.3  AST 15 - 41 U/L 53(H) 27 40  ALT 0 - 44 U/L 37 21 18  Alk Phosphatase 38 - 126 U/L 87 98 121  Total Bilirubin 0.3 - 1.2 mg/dL 0.6 1.0 0.6  Bilirubin, Direct 0.0 - 0.3 mg/dL - - -   CBC Latest Ref Rng & Units 10/02/2021 09/30/2021 09/26/2021  WBC 4.0 - 10.5 K/uL 22.0(H) 22.0(H) 21.7(H)  Hemoglobin 12.0 - 15.0 g/dL 8.4(L) 8.5(L) 8.1(L)  Hematocrit 36.0 - 46.0 % 27.6(L) 27.8(L) 25.5(L)  Platelets 150 - 400 K/uL 464(H) 483(H) 410(H)   Lipid Panel     Component Value Date/Time   CHOL 161 09/24/2021 0221   TRIG 121 09/24/2021 0221   HDL 27 (L) 09/24/2021 0221   CHOLHDL 6.0 09/24/2021 0221   VLDL  24 09/24/2021 0221   LDLCALC 110 (H) 09/24/2021 0221   LDLDIRECT 173.8 10/24/2009 1152   Cardiac Panel (last 3 results) No results for input(s): CKTOTAL, CKMB, TROPONINI, RELINDX in the last 72 hours.  HEMOGLOBIN A1C Lab Results  Component Value Date   HGBA1C 6.7 (H) 09/17/2021   MPG 146 09/17/2021   TSH Recent Labs    09/24/21 0221  TSH 0.177*    BNP (last 3 results) Recent Labs    09/23/21 0613 09/24/21 0221 09/25/21 0241  BNP 1,323.2* 1,495.3* 1,291.5*     ProBNP (last 3 results) No results for input(s): PROBNP in the last 8760 hours.  Imaging: Chest x-ray 1 view 09/25/2021: Status post coronary bypass graft. Stable cardiomediastinal silhouette. Endotracheal and feeding tubes are in good position. Status post left shoulder arthroplasty. Mild bibasilar atelectasis is noted  Cardiac Studies:  Echocardiogram 09/23/2021: 1. Left ventricular ejection fraction, by estimation, is 45 to 50%. Left  ventricular ejection fraction by PLAX  is 54 %. The left ventricle has  normal function. The left ventricle demonstrates regional wall motion  abnormalities  Left ventricular diastolic parameters  are consistent with Grade III diastolic dysfunction (restrictive).  Elevated left ventricular end-diastolic pressure. There is mild  hypokinesis of the left ventricular, entire  inferoseptal wall, inferior wall and inferolateral wall.   2. Right ventricular systolic function is normal. The right ventricular  size is normal. There is moderately elevated pulmonary artery systolic pressure. The estimated right ventricular systolic pressure is 17.6 mmHg.   3. Left atrial size was mildly dilated.   4. Moderate to at most moderately severe posteriorly directed MR.  Consider papillary muscle dysfunction. The mitral valve is normal in  structure. Moderate to severe mitral valve regurgitation. No evidence of  mitral stenosis.   5. The aortic valve is normal in structure. Aortic valve regurgitation is not visualized. No aortic stenosis is present.   6. The inferior vena cava is dilated in size with <50% respiratory  variability, suggesting right atrial pressure of 15 mmHg.   Left Heart Catheterization 09/23/21:  RCA diffuse in the proximal and mid RCA with ulceration and scattered 80-99% stenosis.  Successful PCI to ulcerated high grade native RCA with implantation of 3.0x48 mm Synergy SD in the proximal to mid RCA. Timi 3 to 3 flow. LM diffuse 80% stenosis.  LAD patent with mild disease and LIMA to LAD patent.  CX: Moderate OM1 widely patent. Cx severely diseased after origin of OM 1 with 90-95% stenosis.  Small diseased OM-2 RI: Small with ostial mild disease.  SVG to OM1-2 skip graft occluded, SVG to RCA occluded.  LV: EF 35-40% with inferior akinesis. No MR.  Hemodynamics: LV 112/18, EDP 25 mm Hg. Ao 114/80, mean 112 mm Hg. No pressure gradient across the AV.  90 mL contrast used  Echocardiogram 03/24/2020:     1. Left ventricular ejection  fraction, by estimation, is 55 to 60%. The  left ventricle has normal function. The left ventricle has no regional  wall motion abnormalities. There is moderate left ventricular hypertrophy.  Left ventricular diastolic parameters are consistent with Grade I diastolic dysfunction (impaired relaxation).   2. Right ventricular systolic function is normal. The right ventricular  size is normal. Tricuspid regurgitation signal is inadequate for assessing PA pressure.   3. The mitral valve is normal in structure. No evidence of mitral valve regurgitation. No evidence of mitral stenosis.   4. The aortic valve is normal in structure. Aortic valve regurgitation is not  visualized. No aortic stenosis is present.   5. The inferior vena cava is normal in size with greater than 50%  respiratory variability, suggesting right atrial pressure of 3 mmHg.   CABG 04/29/2016: LIMA to LAD, SVG to RCA, SVG to OM1, SVG to OM2 for left main 90% stenosis 50% stenosis in circumflex and 60% stenosis in OM1, 50% stenosis in proximal RCA.  EKG  EKG 10/01/2021: Normal sinus rhythm.  Normal EKG.  No evidence of ischemia.  EKG 09/30/2021: Atrial fibrillation with rapid ventricular response at rate of 130 bpm, normal axis, no evidence of ischemia.  Normal QT interval.  09/22/21 at 22:59 hours: Acute injury pattern in the inferior leads with reciprocal ST depression in lateral leads. Underlying Mobitz I AV Block.     03/29/2020: Sinus tachycardia at rate of 106 bpm, poor R wave progression, probably normal variant.  No evidence of ischemia.   Scheduled Meds:  apixaban  5 mg Oral BID   arformoterol  15 mcg Nebulization BID   aspirin  81 mg Oral Daily   atorvastatin  80 mg Oral Daily   budesonide (PULMICORT) nebulizer solution  0.25 mg Nebulization BID   Chlorhexidine Gluconate Cloth  6 each Topical Daily   dapagliflozin propanediol  10 mg Oral Daily   feeding supplement  1 Container Oral TID BM   insulin aspart   0-15 Units Subcutaneous TID WC   insulin aspart  5 Units Subcutaneous TID WC   mouth rinse  15 mL Mouth Rinse BID   metoprolol tartrate  50 mg Oral BID   nicotine  21 mg Transdermal Daily   oxyCODONE  5 mg Oral Q8H   pantoprazole  40 mg Oral BID   polyethylene glycol  17 g Oral Daily   QUEtiapine  25 mg Oral QHS   revefenacin  175 mcg Nebulization Daily   sodium chloride flush  10-40 mL Intracatheter Q12H   ticagrelor  90 mg Oral BID   Continuous Infusions:  sodium chloride Stopped (09/23/21 1753)   diltiazem (CARDIZEM) infusion 7.5 mg/hr (10/01/21 2048)   PRN Meds:.acetaminophen, ipratropium-albuterol, oxyCODONE-acetaminophen **AND** oxyCODONE, promethazine, sodium chloride flush  Assessment/Plan:  Inferior lateral STEMI status post RCA stenting New onset atrial fibrillation with rapid ventricular response CHA2DS2-VASc Score is 3.  Yearly risk of stroke: 3.2% (F, Vasc Dz, HTN).  Score of 1=0.6; 2=2.2; 3=3.2; 4=4.8; 5=7.2; 6=9.8; 7=>9.8) -(CHF; HTN; vasc disease DM,  Female = 1; Age <65 =0; 65-74 = 1,  >75 =2; stroke/embolism= 2).  COPD exacerbation Tobacco use disorder  Recommendation:   Patient has converted back to sinus rhythm.  I will discontinue IV diltiazem and switch her to diltiazem CD1 120 mg daily, she has an allergy listed for increasing paradoxical hypertension with diltiazem, however we will see how she does over time.  As she is on Eliquis, discontinue Brilinta and switch her to Plavix.  Continue aspirin 81 mg daily.  I will continue this for at least 4 to 6 weeks, will consider discontinuing aspirin after 4 weeks.  From CAD standpoint, she is stable.  From cardiac standpoint she is stable.  No clinical evidence of heart failure.  She can be discharged home.  We will see her back in the office.  Adrian Prows, MD, [facility] 10/02/2021, 8:25 AM Office: [phone] Fax: [phone] Pager: [phone]

## 2021-10-02 NOTE — Progress Notes (Signed)
CARDIAC REHAB PHASE I   Reinforced education with pt and spouse. Pt denies questions or concerns at this time. Stressed importance of safety. Pt requesting walker for home use, PT aware and heading to work with pt now. Pt hopeful for d/c today, appt scheduled for cardiologist and shoulder surgeon. Referred to [facility] [place].  [id number] Reynold Bowen, RN BSN 10/02/2021 10:31 AM

## 2021-10-02 NOTE — TOC Progression Note (Signed)
Transition of Care [facility]) - Progression Note    Patient Details  Name: [name] MRN: [id number] Date of Birth: [birth year]  [facility] CM/SW Contact  Graves-Bigelow, Lamar Laundry, RN Phone Number: 10/02/2021, 1:19 PM  Clinical Narrative: Patient plans to transition home today with oxygen. Case Manager spoke with patient and spouse about durable medical equipment (DME) oxygen and the patient is agreeable to services with [employer]. Referral made to [employer] and DME will be delivered to the room prior to transition home. Patient lives in [place] and Case Manager is unable to send an ambulatory referral to outpatient PT in [place]. MD and patient is aware that the patient will need a Rx for outpatient PT services. No further needs from Case Manager at this time.   Expected Discharge Plan: Home/Self Care Barriers to Discharge: No Barriers Identified  Expected Discharge Plan and Services Expected Discharge Plan: Home/Self Care In-house Referral: NA Discharge Planning Services: CM Consult Post Acute Care Choice: NA Living arrangements for the past 2 months: Single Family Home Expected Discharge Date: 10/02/21               DME Arranged: N/A DME Agency: NA       HH Arranged: NA     Readmission Risk Interventions No flowsheet data found.

## 2021-10-02 NOTE — Progress Notes (Signed)
Speech Language Pathology Treatment: Dysphagia  [name] Details Name: [name] MRN: [id number] DOB: [birth year] Today's Date: 10/02/2021 Time: 1191-4782 SLP Time Calculation (min) (ACUTE ONLY): 23 min  Assessment / Plan / Recommendation Clinical Impression  Pt says she no longer has odynophagia or any significant feelings of heartburn. Since she has been feeling better, she has been having her husband bring her regular texture foods, and so she is eager for a diet upgrade. SLP provided trials of regular solids and thin liquids. A delayed cough was noted x1, but was not demonstrated again across the remainder of trials. She has also been on thin liquids for several days now. Recommend advancing solids to regular consistency, continuing thin liquids. SLP reviewed esophageal and aspiration precautions throughout session.     HPI HPI: Pt is a 65 yo female who presented to [facility] ED 12/7 after being found unresponsive. PT was intubated in the field and found to have STEMI, so was transferred to [facility] for PCI of RCA. She was extubated post-procedure but reintubated 12/7-12/11. PMH includes: CAD s/p CABG x 4, HTN, HLD, DM, COPD.      SLP Plan  Continue with current plan of care      Recommendations for follow up therapy are one component of a multi-disciplinary discharge planning process, led by the attending physician.  Recommendations may be updated based on [name] status, additional functional criteria and insurance authorization.    Recommendations  Diet recommendations: Regular;Thin liquid Liquids provided via: Cup;Straw Medication Administration: Whole meds with puree Supervision: [name] able to self feed;Full supervision/cueing for compensatory strategies Compensations: Slow rate;Small sips/bites Postural Changes and/or Swallow Maneuvers: Seated upright 90 degrees                Oral Care Recommendations: Oral care BID Follow Up Recommendations: No SLP follow  up Assistance recommended at discharge: Intermittent Supervision/Assistance SLP Visit Diagnosis: Dysphagia, unspecified (R13.10) Plan: Continue with current plan of care           Mahala Menghini., M.A. CCC-SLP Acute Rehabilitation Services Pager [phone] Office [phone]  10/02/2021, 10:32 AM

## 2021-10-02 NOTE — Telephone Encounter (Signed)
Pt still admitted in the hospital

## 2021-10-02 NOTE — Discharge Summary (Signed)
Physician Discharge Summary  [name] [id number] DOB: July 18, 1956 DOA: 09/23/2021  PCP: Patient, No Pcp Per (Inactive)  Admit date: 09/23/2021 Discharge date: 10/02/2021  Admitted From: Home Disposition:  Home  Discharge Condition:Stable CODE STATUS:FULL Diet recommendation: Heart Healthy   Brief/Interim Summary:  Patient is a 58 female with history of C. difficile colitis, hyperlipidemia, anxiety/depression, tobacco use, peripheral vascular disease, COPD, hypertension, coronary artery disease who presented to [facility] ED on 12/11 after she was found to be unresponsive at home.  She was brought to the emergency department intubated by EMS.  She was found to have inferolateral STEMI and was transferred to [facility] for emergent PCI.  She was taken to Cath Lab and had stenting of RCA.  She was on PCCM service.  Transferred to our service on 12/14..  Currently on 1-2 L of oxygen per minute.  Respiratory status has improved.In the morning of 12/15, she went into A. fib with RVR .  Cardiology notified.Started on cardizem drip.  Now on normal sinus rhythm.  Started on Eliquis, oral Cardizem.  She is medically stable for discharge home today.  She will follow-up with cardiology, pulmonology as an outpatient  Following problems were addressed during her hospitalization:  Acute STEMI: Found to be unresponsive.  Underwent urgent PCI to RCA. Cardiology recommend outpatient follow-up.  Continue aspirin , plavix,lipitor, metoprolol   Afib with RVR: New onset. She went into rapid A. fib  and was started on Cardizem drip.  Started on Eliquis for anticoagulation, oral Cardizem.  She will continue metoprolol   acute on chronic hypoxic/hypercarbic respiratory failure due to left lower lobe pneumonia/acute COPD exacerbation: Had be intubated on presentation for airway protection.  PCCM were following. Previous chest x-ray had some infiltrate on left mid and left lower lungs.  Treated with 5 days of  antibiotics. CT angio did not show any PE but showed bilateral small pleural effusion, atelectasis, groundglass attenuation in the upper lobes She qualified for home oxygen with 1 L of oxygen per minute.  She will follow-up with PCCM as an outpatient.   Dysphagia: Speech therapy were  following. She was found to have significant reflux.On protonix.  Speech therapy recommended dysphagia diet for now.    Moderate to severe mitral regurgitation: Concern for papillary muscle dysfunction secondary to ischemia.  Follow-up with cardiology as an outpatient.   Leukocytosis: On reviewing her previous lab works, her WBC is chronically elevated more than 20 K.  We recommend to follow-up with hematology as an outpatient.discussed with Dr. Lorenso Courier, outpatient follow-up will be arranged   Diabetes type 2: Hemoglobin A1c of 6.7. Started on farxiga   Low TSH: Likely euthyroid sick syndrome with normal free T4   Debility/deconditioning: PT/OT consulted today, recommended outpatient follow-up  Recent history of left shoulder fracture: Status post reverse total shoulder arthroplasty.  Outpatient follow-up recommended.  Sutures removed today.  I recommended gentle activity with the left upper extremity without heavy lifting   Discharge Diagnoses:  Principal Problem:   Acute ST elevation myocardial infarction (STEMI) of inferior wall (HCC) Active Problems:   Coronary artery disease involving native coronary artery of native heart with unstable angina pectoris (HCC)   Acute ST elevation myocardial infarction (STEMI) involving other coronary artery of inferior wall (HCC)   Acute diastolic heart failure ([place])   Hypoxia    Discharge Instructions  Discharge Instructions     Amb Referral to Cardiac Rehabilitation   Complete by: As directed    Diagnosis:  Coronary Stents  STEMI     After initial evaluation and assessments completed: Virtual Based Care may be provided alone or in conjunction with Phase 2  Cardiac Rehab based on patient barriers.: Yes   Ambulatory referral to Physical Therapy   Complete by: As directed    Diet - low sodium heart healthy   Complete by: As directed    Discharge instructions   Complete by: As directed    1)Please take prescribed medications as instructed 2)Follow up with your PCP in a week.  Do a CBC, BMP tests during the follow-up 3)You have an appointment with cardiology on 10/06/2021 at 10:30 AM 4)Follow up with pulmonology as an outpatient.  He might be  called for follow-up appointment   Increase activity slowly   Complete by: As directed    No wound care   Complete by: As directed       Allergies as of 10/02/2021       Reactions   Cefepime Anaphylaxis, Shortness Of Breath, Swelling, Dermatitis   Edoxaban Other (See Comments)   Causes hematomas   Penicillins Anaphylaxis   Barium Iodide Rash   Other    Other reaction(s): Other Preservatives  (C-Diff) Other reaction(s): Unknown Preservatives  (C-Diff) Other reaction(s): OTHER   Acetaminophen    Will not take after Cyanide deaths, something changed when they mkt'd again.  Can take Childrens Liquid but sometimes upset stomach too   Albuterol Hypertension   ABLE TO TOLERATE IN INHALER FORM NOT NEBULIZER    Aspirin    Makes anemic   Beta Adrenergic Blockers    Paradoxical reactions   Diltiazem Other (See Comments)   Patient is intolerant to all CALCIUM CHANNEL BLOCKERS, causes her blood pressure to rise.    Lisinopril    Can only take her own mfr.     Oxycodone    Prednisone    REACTION: ibs probs   Propofol    REACTION: FEVER   Repatha [evolocumab] Other (See Comments)   Knocked her out for 10 days   Statins    GI issues   Sulfamethoxazole-trimethoprim    REACTION: antibiotic induced colitis   Lac Bovis Other (See Comments)        Medication List     STOP taking these medications    amLODipine 5 MG tablet Commonly known as: NORVASC       TAKE these medications     albuterol 108 (90 Base) MCG/ACT inhaler Commonly known as: VENTOLIN HFA Inhale 2 puffs into the lungs 3 (three) times daily as needed for wheezing or shortness of breath.   apixaban 5 MG Tabs tablet Commonly known as: ELIQUIS Take 1 tablet (5 mg total) by mouth 2 (two) times daily.   aspirin 81 MG chewable tablet Chew 1 tablet (81 mg total) by mouth daily. Start taking on: October 03, 2021 What changed:  when to take this additional instructions   atorvastatin 80 MG tablet Commonly known as: LIPITOR Take 1 tablet (80 mg total) by mouth daily. Start taking on: October 03, 2021   clopidogrel 75 MG tablet Commonly known as: PLAVIX Take 1 tablet (75 mg total) by mouth daily. Start taking on: October 03, 2021   dapagliflozin propanediol 10 MG Tabs tablet Commonly known as: FARXIGA Take 1 tablet (10 mg total) by mouth daily. Start taking on: October 03, 2021   diltiazem 120 MG 24 hr capsule Commonly known as: CARDIZEM CD Take 1 capsule (120 mg total) by mouth daily. Start taking on: October 03, 2021   methocarbamol 500 MG tablet Commonly known as: Robaxin Take 1 tablet (500 mg total) by mouth every 8 (eight) hours as needed for muscle spasms.   metoprolol tartrate 50 MG tablet Commonly known as: LOPRESSOR Take 1 tablet (50 mg total) by mouth 2 (two) times daily.   nicotine 21 mg/24hr patch Commonly known as: NICODERM CQ - dosed in mg/24 hours Place 1 patch (21 mg total) onto the skin daily. Start taking on: October 03, 2021   NovoLOG FlexPen 100 UNIT/ML FlexPen Generic drug: insulin aspart Inject 0-2 Units into the skin with breakfast, with lunch, and with evening meal. Sliding scale   oxyCODONE-acetaminophen 10-325 MG tablet Commonly known as: PERCOCET Take 1 tablet by mouth every 4 (four) hours as needed for pain.   pantoprazole 40 MG tablet Commonly known as: PROTONIX Take 1 tablet (40 mg total) by mouth 2 (two) times daily.   promethazine 25 MG  suppository Commonly known as: PHENERGAN Place 25 mg rectally daily as needed for nausea.   promethazine 25 MG tablet Commonly known as: PHENERGAN Take 1 tablet (25 mg total) by mouth every 6 (six) hours as needed for nausea or vomiting.   Stiolto Respimat 2.5-2.5 MCG/ACT Aers Generic drug: Tiotropium Bromide-Olodaterol Inhale 2 puffs into the lungs daily.   Vitamin D (Ergocalciferol) 1.25 MG (50000 UNIT) Caps capsule Commonly known as: DRISDOL Take 50,000 Units by mouth once a week.               Durable Medical Equipment  (From admission, onward)           Start     Ordered   10/02/21 0805  For home use only DME oxygen  Once       Question Answer Comment  Length of Need Lifetime   Mode or (Route) Nasal cannula   Liters per Minute 1   Frequency Continuous (stationary and portable oxygen unit needed)   Oxygen delivery system Gas      10/02/21 0804            Follow-up Information     Cantwell, Celeste C, PA-C Follow up on 10/14/2021.   Specialty: Cardiology Why: 10:30 AM. Please bring all medications Contact information: [place] [address] [phone]         [facility] Follow up.   Why: Oxygen- to be delivered to the room before d/c. Contact information: [address] [place] [address] [phone]                Allergies  Allergen Reactions   Cefepime Anaphylaxis, Shortness Of Breath, Swelling and Dermatitis   Edoxaban Other (See Comments)    Causes hematomas   Penicillins Anaphylaxis   Barium Iodide Rash   Other     Other reaction(s): Other Preservatives  (C-Diff) Other reaction(s): Unknown Preservatives  (C-Diff) Other reaction(s): OTHER   Acetaminophen     Will not take after Cyanide deaths, something changed when they mkt'd again.  Can take Childrens Liquid but sometimes upset stomach too   Albuterol Hypertension    ABLE TO TOLERATE IN INHALER FORM NOT NEBULIZER    Aspirin      Makes anemic   Beta Adrenergic Blockers     Paradoxical reactions   Diltiazem Other (See Comments)    Patient is intolerant to all CALCIUM CHANNEL BLOCKERS, causes her blood pressure to rise.    Lisinopril     Can only take her own  mfr.     Oxycodone    Prednisone     REACTION: ibs probs   Propofol     REACTION: FEVER   Repatha [Evolocumab] Other (See Comments)    Knocked her out for 10 days   Statins     GI issues   Sulfamethoxazole-Trimethoprim     REACTION: antibiotic induced colitis   Lac Bovis Other (See Comments)    Consultations: Cardiology,PCCM   Procedures/Studies: CT Angio Chest Pulmonary Embolism (PE) W or WO Contrast  Result Date: 10/01/2021 CLINICAL DATA:  65 year old female with positive D-dimer and shortness of breath. Suspected pulmonary embolism. EXAM: CT ANGIOGRAPHY CHEST WITH CONTRAST TECHNIQUE: Multidetector CT imaging of the chest was performed using the standard protocol during bolus administration of intravenous contrast. Multiplanar CT image reconstructions and MIPs were obtained to evaluate the vascular anatomy. CONTRAST:  75mL OMNIPAQUE IOHEXOL 350 MG/ML SOLN COMPARISON:  Chest CTA 03/25/2020. FINDINGS: Cardiovascular: There are no filling defects within the pulmonary arterial tree to suggest pulmonary embolism. Heart size is mildly enlarged with some concentric left ventricular hypertrophy. There is no significant pericardial fluid, thickening or pericardial calcification. There is aortic atherosclerosis, as well as atherosclerosis of the great vessels of the mediastinum and the coronary arteries, including calcified atherosclerotic plaque in the left anterior descending, left circumflex and right coronary arteries. Status post median sternotomy for CABG including LIMA to the LAD. Right upper extremity PICC with tip terminating in the mid superior vena cava. Mediastinum/Nodes: No pathologically enlarged mediastinal or hilar lymph nodes. Moderate-sized  hiatal hernia. No axillary lymphadenopathy. Lungs/Pleura: There are dependent opacities in the lower lobes of the lungs bilaterally, compatible with areas of subsegmental atelectasis. Small bilateral pleural effusions are noted. Some patchy areas of mild ground-glass attenuation are evident, most notably in the posterior aspect of the left upper lobe near the apex. No other confluent consolidative airspace disease. No pneumothorax. No definite suspicious appearing pulmonary nodules or masses are noted. Upper Abdomen: Unremarkable. Musculoskeletal: Median sternotomy wires. Status post left shoulder arthroplasty. There are no aggressive appearing lytic or blastic lesions noted in the visualized portions of the skeleton. Review of the MIP images confirms the above findings. IMPRESSION: 1. No evidence of pulmonary embolism. 2. Cardiomegaly with small bilateral pleural effusions lying dependently and some passive subsegmental atelectasis in the lower lobes of the lungs bilaterally. 3. Patchy areas of ground-glass attenuation in the lungs, most evident in the left upper lobe near the apex. These may be of infectious or inflammatory etiology. 4. Aortic atherosclerosis, in addition to three-vessel coronary artery disease. Status post median sternotomy for CABG including LIMA to the LAD. 5. Moderate-sized hiatal hernia. 6. Additional incidental findings, as above. Aortic Atherosclerosis (ICD10-I70.0). Electronically Signed   By: Trudie Reed M.D.   On: 10/01/2021 06:44   CARDIAC CATHETERIZATION  Result Date: 09/23/2021 Left Heart Catheterization 09/23/21: RCA diffuse in the proximal and mid RCA with ulceration and scattered 80-99% stenosis. Successful PCI to ulcerated high grade native RCA with implantation of 3.0x48 mm Synergy SD in the proximal to mid RCA. Timi 3 to 3 flow. LM diffuse 80% stenosis. LAD patent with mild disease and LIMA to LAD patent. CX: Moderate OM1 widely patent. Cx severely diseased after origin  of OM 1 with 90-95% stenosis.  Small diseased OM-2 RI: Small with ostial mild disease. SVG to OM1-2 skip graft occluded, SVG to RCA occluded. LV: EF 35-40% with inferior akinesis. No MR. Hemodynamics: LV 112/18, EDP 25 mm Hg. Ao 114/80, mean 112 mm Hg.  No pressure gradient across the AV. 90 mL contrast used.   DG CHEST PORT 1 VIEW  Result Date: 09/25/2021 CLINICAL DATA:  Respiratory failure. EXAM: PORTABLE CHEST 1 VIEW COMPARISON:  September 24, 2021. FINDINGS: Status post coronary bypass graft. Stable cardiomediastinal silhouette. Endotracheal and feeding tubes are in good position. Status post left shoulder arthroplasty. Mild bibasilar atelectasis is noted. IMPRESSION: Stable support apparatus.  Mild bibasilar subsegmental atelectasis. Electronically Signed   By: Marijo Conception M.D.   On: 09/25/2021 10:32   DG CHEST PORT 1 VIEW  Result Date: 09/24/2021 CLINICAL DATA:  Difficulty breathing EXAM: PORTABLE CHEST 1 VIEW COMPARISON:  Previous studies including the examination of 09/23/2021 FINDINGS: Tip of endotracheal tube is 1.5 cm above the carina and should be pulled back 2-3 cm. Enteric tube is noted traversing the esophagus. There is patchy infiltrate in the left mid and left lower lung fields with interval worsening. Rest of the lung fields are unremarkable. There is no significant pleural effusion or pneumothorax. There is previous left shoulder arthroplasty. IMPRESSION: There is interval increase in infiltrate in the left mid and left lower lung fields suggesting worsening pneumonia. Tip of endotracheal tube is 1.5 cm above the carina and should be pulled back 2-3 cm. These results will be called to the ordering clinician or representative by the Radiologist Assistant, and communication documented in the PACS or [employer]. Electronically Signed   By: Elmer Picker M.D.   On: 09/24/2021 09:54   DG CHEST PORT 1 VIEW  Result Date: 09/23/2021 CLINICAL DATA:  Hypoxia, asthma, COPD EXAM:  PORTABLE CHEST 1 VIEW COMPARISON:  09/22/2021 FINDINGS: Endotracheal tube tip is approximately 2.9 cm above the carina, although the tip of the endotracheal tube is somewhat obscured by overlying median sternotomy wires. Normal cardiac and mediastinal contours. Again noted is slightly prominent hilar vasculature. Left basilar atelectasis. No consolidative pulmonary opacity. No right pleural effusion. Possible trace left pleural effusion. No acute osseous abnormality. Redemonstrated left shoulder arthroplasty. IMPRESSION: 1. Endotracheal tube tip 2.9 cm above the carina. 2. Possible trace left pleural effusion with associated atelectasis. Electronically Signed   By: Merilyn Baba M.D.   On: 09/23/2021 11:50   DG Shoulder Left Port  Result Date: 09/16/2021 CLINICAL DATA:  Recent shoulder replacement. EXAM: LEFT SHOULDERPortable two-view exam. COMPARISON:  Left shoulder series 09/14/2021 FINDINGS: Interval resection of the left humeral head and neck and insertion of a reverse total joint arthroplasty the left shoulder now seen. No periprosthetic fracture or prosthetic dislocation is seen. There is osteopenia. There are scattered soft tissue gas in the operative bed. There are overlying skin staples. The [facility] joint is intact. IMPRESSION: Reverse left shoulder arthroplasty now noted, without evidence of acute complication. Osteopenia. Electronically Signed   By: Telford Nab M.D.   On: 09/16/2021 22:50   DG Abd Portable 1V  Result Date: 09/23/2021 CLINICAL DATA:  Feeding tube placement EXAM: PORTABLE ABDOMEN - 1 VIEW COMPARISON:  None FINDINGS: Soft feeding tube enters the stomach in has its tip at the body antral junction. Small bowel pattern is normal. IMPRESSION: Soft feeding tube tip at the junction of the body and antrum. Electronically Signed   By: Nelson Chimes M.D.   On: 09/23/2021 14:18   ECHOCARDIOGRAM COMPLETE  Result Date: 09/23/2021    ECHOCARDIOGRAM REPORT   Patient Name:   LYUBOV MARGISON [facility]  Date of Exam: 09/23/2021 Medical Rec #:  [id number]         Height:  61.0 in Accession #:    [id number]        Weight:       135.1 lb Date of Birth:  Nov 05, 1955         BSA:          1.599 m Patient Age:    17 years          BP:           124/59 mmHg Patient Gender: F                 HR:           106 bpm. Exam Location:  Inpatient Procedure: 2D Echo, Cardiac Doppler, Color Doppler and Intracardiac            Opacification Agent                                 MODIFIED REPORT: This report was modified by Adrian Prows MD on 09/23/2021 due to Mitral stenosis not                                     present.  Indications:     CAD  History:         Patient has prior history of Echocardiogram examinations, most                  recent 03/24/2020. CAD, post same day cath and Prior CABG, COPD;                  Risk Factors:Current Smoker.  Sonographer:     Merrie Roof RDCS Referring Phys:  [place] Diagnosing Phys: Adrian Prows MD IMPRESSIONS  1. Left ventricular ejection fraction, by estimation, is 45 to 50%. Left ventricular ejection fraction by PLAX is 54 %. The left ventricle has normal function. The left ventricle demonstrates regional wall motion abnormalities (see scoring diagram/findings for description). Left ventricular diastolic parameters are consistent with Grade III diastolic dysfunction (restrictive). Elevated left ventricular end-diastolic pressure. There is mild hypokinesis of the left ventricular, entire inferoseptal wall, inferior wall and inferolateral wall.  2. Right ventricular systolic function is normal. The right ventricular size is normal. There is moderately elevated pulmonary artery systolic pressure. The estimated right ventricular systolic pressure is 0000000 mmHg.  3. Left atrial size was mildly dilated.  4. Moderate to at most moderately severe posteriorly directed MR. Consider papillary muscle dysfunction. The mitral valve is normal in structure. Moderate to severe mitral valve regurgitation.  No evidence of mitral stenosis.  5. The aortic valve is normal in structure. Aortic valve regurgitation is not visualized. No aortic stenosis is present.  6. The inferior vena cava is dilated in size with <50% respiratory variability, suggesting right atrial pressure of 15 mmHg. FINDINGS  Left Ventricle: Left ventricular ejection fraction, by estimation, is 45 to 50%. Left ventricular ejection fraction by PLAX is 54 %. The left ventricle has normal function. The left ventricle demonstrates regional wall motion abnormalities. Mild hypokinesis of the left ventricular, entire inferoseptal wall, inferior wall and inferolateral wall. Definity contrast agent was given IV to delineate the left ventricular endocardial borders. The left ventricular internal cavity size was normal in size.  There is no left ventricular hypertrophy. Left ventricular diastolic parameters are consistent with Grade III diastolic dysfunction (restrictive). Elevated left ventricular end-diastolic  pressure. Right Ventricle: The right ventricular size is normal. No increase in right ventricular wall thickness. Right ventricular systolic function is normal. There is moderately elevated pulmonary artery systolic pressure. The tricuspid regurgitant velocity is 2.97 m/s, and with an assumed right atrial pressure of 15 mmHg, the estimated right ventricular systolic pressure is 0000000 mmHg. Left Atrium: Left atrial size was mildly dilated. Right Atrium: Right atrial size was normal in size. Pericardium: There is no evidence of pericardial effusion. Mitral Valve: Moderate to at most moderately severe posteriorly directed MR. Consider papillary muscle dysfunction. The mitral valve is normal in structure. Mildly decreased mobility of the mitral valve leaflets. Mild mitral annular calcification. Moderate to severe mitral valve regurgitation. No evidence of mitral valve stenosis. Tricuspid Valve: The tricuspid valve is normal in structure. Tricuspid valve  regurgitation is mild. Aortic Valve: The aortic valve is normal in structure. Aortic valve regurgitation is not visualized. No aortic stenosis is present. Pulmonic Valve: The pulmonic valve was normal in structure. Pulmonic valve regurgitation is not visualized. No evidence of pulmonic stenosis. Aorta: The aortic root is normal in size and structure. Venous: The inferior vena cava is dilated in size with less than 50% respiratory variability, suggesting right atrial pressure of 15 mmHg. IAS/Shunts: The interatrial septum was not well visualized.  LEFT VENTRICLE PLAX 2D LV EF:         Left            Diastology                ventricular     LV e' medial:    8.05 cm/s                ejection        LV E/e' medial:  17.0                fraction by     LV e' lateral:   6.64 cm/s                PLAX is 54      LV E/e' lateral: 20.6                %. LVIDd:         3.70 cm LVIDs:         2.70 cm LV PW:         0.90 cm LV IVS:        0.70 cm LVOT diam:     2.00 cm LVOT Area:     3.14 cm  IVC IVC diam: 2.20 cm LEFT ATRIUM             Index        RIGHT ATRIUM           Index LA diam:        3.60 cm 2.25 cm/m   RA Area:     14.70 cm LA Vol (A2C):   27.7 ml 17.32 ml/m  RA Volume:   37.70 ml  23.58 ml/m LA Vol (A4C):   54.9 ml 34.34 ml/m LA Biplane Vol: 42.7 ml 26.71 ml/m   AORTA Ao Root diam: 3.00 cm MITRAL VALVE                TRICUSPID VALVE MV Area (PHT): 8.82 cm     TR Peak grad:   35.3 mmHg MV Decel Time: 86 msec      TR Vmax:        297.00  cm/s MV E velocity: 137.00 cm/s MV A velocity: 66.70 cm/s   SHUNTS MV E/A ratio:  2.05         Systemic Diam: 2.00 cm Adrian Prows MD Electronically signed by Adrian Prows MD Signature Date/Time: 09/23/2021/7:57:31 PM    Final (Updated)    VAS [place] UPPER EXTREMITY VENOUS DUPLEX  Result Date: 09/24/2021 UPPER VENOUS STUDY  Patient Name:  KODY PURECO [facility]  Date of Exam:   09/24/2021 Medical Rec #: [id number]          Accession #:    [id number] Date of Birth: 1956/09/08           Patient Gender: F Patient Age:   48 years Exam Location:  [facility] Procedure:      VAS [place] UPPER EXTREMITY VENOUS DUPLEX Referring Phys: CELESTE CANTWELL --------------------------------------------------------------------------------  Indications: Edema Comparison Study: no prior Performing Technologist: Archie Patten RVS  Examination Guidelines: A complete evaluation includes B-mode imaging, spectral Doppler, color Doppler, and power Doppler as needed of all accessible portions of each vessel. Bilateral testing is considered an integral part of a complete examination. Limited examinations for reoccurring indications may be performed as noted.  Right Findings: +----------+------------+---------+-----------+----------+-------+  RIGHT      Compressible Phasicity Spontaneous Properties Summary  +----------+------------+---------+-----------+----------+-------+  Subclavian     Full        Yes        Yes                         +----------+------------+---------+-----------+----------+-------+  Left Findings: +----------+------------+---------+-----------+----------+-------+  LEFT       Compressible Phasicity Spontaneous Properties Summary  +----------+------------+---------+-----------+----------+-------+  IJV            Full        Yes        Yes                         +----------+------------+---------+-----------+----------+-------+  Subclavian     Full        Yes        Yes                         +----------+------------+---------+-----------+----------+-------+  Axillary       Full        Yes        Yes                         +----------+------------+---------+-----------+----------+-------+  Brachial       Full        Yes        Yes                         +----------+------------+---------+-----------+----------+-------+  Radial         Full                                               +----------+------------+---------+-----------+----------+-------+  Ulnar          Full                                                +----------+------------+---------+-----------+----------+-------+  Cephalic       Full                                               +----------+------------+---------+-----------+----------+-------+  Basilic        Full                                               +----------+------------+---------+-----------+----------+-------+  Summary:  Right: No evidence of thrombosis in the subclavian.  Left: No evidence of deep vein thrombosis in the upper extremity. No evidence of superficial vein thrombosis in the upper extremity.  *See table(s) above for measurements and observations.  Diagnosing physician: Orlie Pollen Electronically signed by Orlie Pollen on 09/24/2021 at 5:13:56 PM.    Final    [place] EKG SITE RITE  Result Date: 09/24/2021 If Site Rite image not attached, placement could not be confirmed due to current cardiac rhythm.     Subjective: Patient seen and examined at the bedside this morning.  Hemodynamically stable for discharge. Discharge Exam: Vitals:   10/02/21 0826 10/02/21 1113  BP: (!) 137/55 118/88  Pulse: 85 77  Resp: 17 17  Temp: 99.3 F (37.4 C)   SpO2:     Vitals:   10/02/21 0452 10/02/21 0824 10/02/21 0826 10/02/21 1113  BP: (!) 146/56  (!) 137/55 118/88  Pulse: 80  85 77  Resp: 16  17 17   Temp: 98 F (36.7 C)  99.3 F (37.4 C)   TempSrc: Oral  Oral   SpO2: 91% 94%    Weight:      Height:        General: Pt is alert, awake, not in acute distress Cardiovascular: RRR, S1/S2 +, no rubs, no gallops Respiratory: CTA bilaterally, no wheezing, no rhonchi Abdominal: Soft, NT, ND, bowel sounds + Extremities: no edema, no cyanosis    The results of significant diagnostics from this hospitalization (including imaging, microbiology, ancillary and laboratory) are listed below for reference.     Microbiology: Recent Results (from the past 240 hour(s))  Resp Panel by RT-PCR (Flu A&B, Covid) Nasopharyngeal Swab     Status: None   Collection  Time: 09/23/21 12:08 AM   Specimen: Nasopharyngeal Swab; Nasopharyngeal(NP) swabs in vial transport medium  Result Value Ref Range Status   SARS Coronavirus 2 by RT PCR NEGATIVE NEGATIVE Final    Comment: (NOTE) SARS-CoV-2 target nucleic acids are NOT DETECTED.  The SARS-CoV-2 RNA is generally detectable in upper respiratory specimens during the acute phase of infection. The lowest concentration of SARS-CoV-2 viral copies this assay can detect is 138 copies/mL. A negative result does not preclude SARS-Cov-2 infection and should not be used as the sole basis for treatment or other patient management decisions. A negative result may occur with  improper specimen collection/handling, submission of specimen other than nasopharyngeal swab, presence of viral mutation(s) within the areas targeted by this assay, and inadequate number of viral copies(<138 copies/mL). A negative result must be combined with clinical observations, patient history, and epidemiological information. The expected result is Negative.  Fact Sheet for Patients:  EntrepreneurPulse.com.au  Fact Sheet for Healthcare Providers:  IncredibleEmployment.be  This test is no t yet approved or cleared by the [place] FDA and  has been  authorized for detection and/or diagnosis of SARS-CoV-2 by FDA under an Emergency Use Authorization (EUA). This EUA will remain  in effect (meaning this test can be used) for the duration of the COVID-19 declaration under Section 564(b)(1) of the Act, 21 U.S.C.section 360bbb-3(b)(1), unless the authorization is terminated  or revoked sooner.       Influenza A by PCR NEGATIVE NEGATIVE Final   Influenza B by PCR NEGATIVE NEGATIVE Final    Comment: (NOTE) The Xpert Xpress SARS-CoV-2/FLU/RSV plus assay is intended as an aid in the diagnosis of influenza from Nasopharyngeal swab specimens and should not be used as a sole basis for treatment. Nasal washings  and aspirates are unacceptable for Xpert Xpress SARS-CoV-2/FLU/RSV testing.  Fact Sheet for Patients: EntrepreneurPulse.com.au  Fact Sheet for Healthcare Providers: IncredibleEmployment.be  This test is not yet approved or cleared by the [place] FDA and has been authorized for detection and/or diagnosis of SARS-CoV-2 by FDA under an Emergency Use Authorization (EUA). This EUA will remain in effect (meaning this test can be used) for the duration of the COVID-19 declaration under Section 564(b)(1) of the Act, 21 U.S.C. section 360bbb-3(b)(1), unless the authorization is terminated or revoked.  Performed at [facility], [place] [address] [place], [place] [address]   MRSA Next Gen by PCR, Nasal     Status: None   Collection Time: 09/23/21  1:24 AM   Specimen: Nasal Mucosa; Nasal Swab  Result Value Ref Range Status   MRSA by PCR Next Gen NOT DETECTED NOT DETECTED Final    Comment: (NOTE) The GeneXpert MRSA Assay (FDA approved for NASAL specimens only), is one component of a comprehensive MRSA colonization surveillance program. It is not intended to diagnose MRSA infection nor to guide or monitor treatment for MRSA infections. Test performance is not FDA approved in patients less than 66 years old. Performed at [facility], [place] [address], [place], [place] [address]   Culture, Respiratory w Gram Stain     Status: None   Collection Time: 09/24/21 12:22 PM   Specimen: Tracheal Aspirate; Respiratory  Result Value Ref Range Status   Specimen Description TRACHEAL ASPIRATE  Final   Special Requests NONE  Final   Gram Stain   Final    RARE WBC PRESENT,BOTH PMN AND MONONUCLEAR NO ORGANISMS SEEN    Culture   Final    NO GROWTH 2 DAYS Performed at [facility], [address] [place], [place] [address]    Report Status 09/26/2021 FINAL  Final     Labs: BNP (last 3 results) Recent Labs    09/23/21 0613 09/24/21 0221  09/25/21 0241  BNP 1,323.2* 1,495.3* [id number]*   Basic Metabolic Panel: Recent Labs  Lab 09/26/21 0356 09/27/21 0356 09/28/21 0428 09/29/21 0441 09/30/21 2329 10/02/21 0449  NA 135 138 140 140 137 134*  K 4.1 3.9 3.7 3.8 3.2* 4.1  CL 87* 88* 92* 95* 97* 99  CO2 41* 43* 41* 38* 32 28  GLUCOSE 170* 172* 189* 144* 108* 116*  BUN 25* 34* 31* 37* 23 13  CREATININE 0.94 0.96 0.90 0.96 0.90 0.91  CALCIUM 7.8* 8.1* 8.4* 8.7* 7.8* 8.1*  MG 2.5* 2.3  --  2.3 2.1  --   PHOS 3.0 3.5  --   --   --   --    Liver Function Tests: No results for input(s): AST, ALT, ALKPHOS, BILITOT, PROT, ALBUMIN in the last 168 hours. No results for input(s): LIPASE, AMYLASE  in the last 168 hours. No results for input(s): AMMONIA in the last 168 hours. CBC: Recent Labs  Lab 09/26/21 0356 09/30/21 1410 10/02/21 0449  WBC 21.7* 22.0* 22.0*  NEUTROABS  --  17.8* 18.1*  HGB 8.1* 8.5* 8.4*  HCT 25.5* 27.8* 27.6*  MCV 90.1 89.7 91.1  PLT 410* 483* 464*   Cardiac Enzymes: No results for input(s): CKTOTAL, CKMB, CKMBINDEX, TROPONINI in the last 168 hours. BNP: Invalid input(s): POCBNP CBG: Recent Labs  Lab 10/01/21 1237 10/01/21 1534 10/02/21 0753 10/02/21 1145 10/02/21 1210  GLUCAP 105* 102* 129* 46* 76   D-Dimer Recent Labs    09/30/21 2329  DDIMER 3.69*   Hgb A1c No results for input(s): HGBA1C in the last 72 hours. Lipid Profile No results for input(s): CHOL, HDL, LDLCALC, TRIG, CHOLHDL, LDLDIRECT in the last 72 hours. Thyroid function studies No results for input(s): TSH, T4TOTAL, T3FREE, THYROIDAB in the last 72 hours.  Invalid input(s): FREET3 Anemia work up No results for input(s): VITAMINB12, FOLATE, FERRITIN, TIBC, IRON, RETICCTPCT in the last 72 hours. Urinalysis    Component Value Date/Time   COLORURINE YELLOW (A) 03/30/2020 0107   APPEARANCEUR CLEAR (A) 03/30/2020 0107   LABSPEC 1.030 03/30/2020 0107   PHURINE 7.0 03/30/2020 0107   GLUCOSEU >=500 (A) 03/30/2020 0107    HGBUR NEGATIVE 03/30/2020 0107   HGBUR trace-lysed 05/22/2010 1343   BILIRUBINUR NEGATIVE 03/30/2020 0107   KETONESUR NEGATIVE 03/30/2020 0107   PROTEINUR NEGATIVE 03/30/2020 0107   UROBILINOGEN 0.2 05/22/2010 1343   NITRITE NEGATIVE 03/30/2020 0107   LEUKOCYTESUR NEGATIVE 03/30/2020 0107   Sepsis Labs Invalid input(s): PROCALCITONIN,  WBC,  LACTICIDVEN Microbiology Recent Results (from the past 240 hour(s))  Resp Panel by RT-PCR (Flu A&B, Covid) Nasopharyngeal Swab     Status: None   Collection Time: 09/23/21 12:08 AM   Specimen: Nasopharyngeal Swab; Nasopharyngeal(NP) swabs in vial transport medium  Result Value Ref Range Status   SARS Coronavirus 2 by RT PCR NEGATIVE NEGATIVE Final    Comment: (NOTE) SARS-CoV-2 target nucleic acids are NOT DETECTED.  The SARS-CoV-2 RNA is generally detectable in upper respiratory specimens during the acute phase of infection. The lowest concentration of SARS-CoV-2 viral copies this assay can detect is 138 copies/mL. A negative result does not preclude SARS-Cov-2 infection and should not be used as the sole basis for treatment or other patient management decisions. A negative result may occur with  improper specimen collection/handling, submission of specimen other than nasopharyngeal swab, presence of viral mutation(s) within the areas targeted by this assay, and inadequate number of viral copies(<138 copies/mL). A negative result must be combined with clinical observations, patient history, and epidemiological information. The expected result is Negative.  Fact Sheet for Patients:  EntrepreneurPulse.com.au  Fact Sheet for Healthcare Providers:  IncredibleEmployment.be  This test is no t yet approved or cleared by the [place] FDA and  has been authorized for detection and/or diagnosis of SARS-CoV-2 by FDA under an Emergency Use Authorization (EUA). This EUA will remain  in effect (meaning this  test can be used) for the duration of the COVID-19 declaration under Section 564(b)(1) of the Act, 21 U.S.C.section 360bbb-3(b)(1), unless the authorization is terminated  or revoked sooner.       Influenza A by PCR NEGATIVE NEGATIVE Final   Influenza B by PCR NEGATIVE NEGATIVE Final    Comment: (NOTE) The Xpert Xpress SARS-CoV-2/FLU/RSV plus assay is intended as an aid in the diagnosis of influenza from Nasopharyngeal swab specimens and should  not be used as a sole basis for treatment. Nasal washings and aspirates are unacceptable for Xpert Xpress SARS-CoV-2/FLU/RSV testing.  Fact Sheet for Patients: EntrepreneurPulse.com.au  Fact Sheet for Healthcare Providers: IncredibleEmployment.be  This test is not yet approved or cleared by the [place] FDA and has been authorized for detection and/or diagnosis of SARS-CoV-2 by FDA under an Emergency Use Authorization (EUA). This EUA will remain in effect (meaning this test can be used) for the duration of the COVID-19 declaration under Section 564(b)(1) of the Act, 21 U.S.C. section 360bbb-3(b)(1), unless the authorization is terminated or revoked.  Performed at [facility], [place] [address], [place], [place] [address]   MRSA Next Gen by PCR, Nasal     Status: None   Collection Time: 09/23/21  1:24 AM   Specimen: Nasal Mucosa; Nasal Swab  Result Value Ref Range Status   MRSA by PCR Next Gen NOT DETECTED NOT DETECTED Final    Comment: (NOTE) The GeneXpert MRSA Assay (FDA approved for NASAL specimens only), is one component of a comprehensive MRSA colonization surveillance program. It is not intended to diagnose MRSA infection nor to guide or monitor treatment for MRSA infections. Test performance is not FDA approved in patients less than 44 years old. Performed at [facility], [place] [address] [place], [place] [address]   Culture, Respiratory w Gram Stain     Status: None    Collection Time: 09/24/21 12:22 PM   Specimen: Tracheal Aspirate; Respiratory  Result Value Ref Range Status   Specimen Description TRACHEAL ASPIRATE  Final   Special Requests NONE  Final   Gram Stain   Final    RARE WBC PRESENT,BOTH PMN AND MONONUCLEAR NO ORGANISMS SEEN    Culture   Final    NO GROWTH 2 DAYS Performed at [facility], [address] [place], [place] [address]    Report Status 09/26/2021 FINAL  Final    Please note: You were cared for by a hospitalist during your hospital stay. Once you are discharged, your primary care physician will handle any further medical issues. Please note that NO REFILLS for any discharge medications will be authorized once you are discharged, as it is imperative that you return to your primary care physician (or establish a relationship with a primary care physician if you do not have one) for your post hospital discharge needs so that they can reassess your need for medications and monitor your lab values.    Time coordinating discharge: 40 minutes  SIGNED:   Shelly Coss, MD  [facility] 10/02/2021, 1:14 PM Pager [phone]  If 7PM-7AM, please contact night-coverage [email or web address] Password [email or web address]

## 2021-10-02 NOTE — Progress Notes (Signed)
Pt safely discharged. Discharge packet provided with teach-back method. VS wnL and as per flow. PICC removed by [facility] team, Pt verbalized understanding. All questions and concerns addressed. Awaiting on transport.

## 2021-10-02 NOTE — Progress Notes (Signed)
Heart Failure Nurse Navigator Progress Note  No HV TOC as pt is following with Dr. Jacinto Halim and [facility]. Per note: plan for cardiology f/u next week.   Ozella Rocks, MSN, RN Heart Failure Nurse Navigator [phone]

## 2021-10-05 NOTE — Telephone Encounter (Signed)
Attempted to call pt, no answer. Left vm requesting call back.

## 2021-10-06 NOTE — Telephone Encounter (Signed)
Called pt, no answer. Unable to leave vm requesting call back.

## 2021-10-07 NOTE — Telephone Encounter (Signed)
Patient called back: 10/07/21  Location of hospitalization: [place] [facility] Reason for hospitalization: Chest pain, heart attack Date of discharge: 10/02/21 Date of first communication with patient: today Person contacting patient: Erby Pian, CMA Current symptoms: none Do you understand why you were in the Hospital: Yes Questions regarding discharge instructions: None Where were you discharged to: Home Medications reviewed: Yes Allergies reviewed: Yes Dietary changes reviewed: Yes. Discussed low fat and low salt diet.  Referals reviewed: NA Activities of Daily Living: Able to with mild limitations Any transportation issues/concerns: None Any patient concerns: None Confirmed importance & date/time of Follow up appt: Yes Confirmed with patient if condition begins to worsen call. Pt was given the office number and encouraged to call back with questions or concerns: Yes

## 2021-10-07 NOTE — Telephone Encounter (Signed)
3rd attempt to contact patient, NA, call goes straight to VM-box, [facility]

## 2021-10-14 ENCOUNTER — Ambulatory Visit: Payer: 59 | Admitting: Student

## 2021-10-14 ENCOUNTER — Other Ambulatory Visit: Payer: Self-pay

## 2021-10-14 VITALS — BP 135/46 | HR 80 | Ht 63.0 in | Wt 132.0 lb

## 2021-10-14 DIAGNOSIS — I5031 Acute diastolic (congestive) heart failure: Secondary | ICD-10-CM

## 2021-10-14 DIAGNOSIS — I25110 Atherosclerotic heart disease of native coronary artery with unstable angina pectoris: Secondary | ICD-10-CM

## 2021-10-14 MED ORDER — DILTIAZEM HCL ER COATED BEADS 120 MG PO CP24: 120.0000 mg | ORAL_CAPSULE | Freq: Every day | ORAL | 3 refills | Status: AC

## 2021-10-14 MED ORDER — DAPAGLIFLOZIN PROPANEDIOL 10 MG PO TABS: 10.0000 mg | ORAL_TABLET | Freq: Every day | ORAL | 3 refills | Status: AC

## 2021-10-14 MED ORDER — METOPROLOL TARTRATE 50 MG PO TABS: 50.0000 mg | ORAL_TABLET | Freq: Two times a day (BID) | ORAL | 3 refills | Status: AC

## 2021-10-14 MED ORDER — CLOPIDOGREL BISULFATE 75 MG PO TABS: 75.0000 mg | ORAL_TABLET | Freq: Every day | ORAL | 3 refills | Status: DC

## 2021-10-14 MED ORDER — FUROSEMIDE 20 MG PO TABS: 20.0000 mg | ORAL_TABLET | Freq: Every day | ORAL | 3 refills | Status: DC

## 2021-10-14 MED ORDER — APIXABAN 5 MG PO TABS: 5.0000 mg | ORAL_TABLET | Freq: Two times a day (BID) | ORAL | 3 refills | Status: DC

## 2021-10-14 MED ORDER — ATORVASTATIN CALCIUM 80 MG PO TABS: 80.0000 mg | ORAL_TABLET | Freq: Every day | ORAL | 3 refills | Status: DC

## 2021-10-14 NOTE — Progress Notes (Signed)
Primary Physician/Referring:  Elenore Paddy, NP  Patient ID: [name], female    DOB: Aug 20, 1956, 65 y.o.   MRN: [id number]  Chief Complaint  Patient presents with   [facility]   Hospitalization Follow-up   New Patient (Initial Visit)   HPI:    [name]  is a 65 y.o. 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   Patient required admission to the ICU following cardiac catheterization with acute COPD exacerbation and heart failure exacerbation requiring ventilator support. Patient developed atrial fibrillation with RVR and was started on diltiazem, subsequently converted back to normal sinus rhythm. She recovered relatively well and was discharged 10/02/2021 with Eliquis and diltiazem, aspirin, plavix, lipitor, and metoprolol, Farxiga. Echocardiogram during admission showed moderate to severe mitral regurgitation with concern for papillary muscle dysfunction secondary to ischemia.   Patient now presents for follow up. She has not smoked since discharge. Her primary complaint today is fatigue. Reports significant improvement of dyspnea, no chest pain. Denies palpitations, syncope, near-syncope.   Past Medical History:  Diagnosis Date   Asthma    Benign essential HTN    COPD (chronic obstructive pulmonary disease) (HCC)    IBS (irritable bowel syndrome)    Migraine headache    Past Surgical History:  Procedure Laterality Date   CORONARY/GRAFT ACUTE MI REVASCULARIZATION N/A 09/22/2021   Procedure: Coronary/Graft Acute MI Revascularization;  Surgeon: Adrian Prows, MD;  Location: [facility];  Service: Cardiovascular;  Laterality: N/A;   LEFT HEART CATH AND CORONARY ANGIOGRAPHY N/A 09/22/2021   Procedure: LEFT HEART CATH AND CORONARY ANGIOGRAPHY;  Surgeon: Adrian Prows, MD;  Location: [facility];  Service: Cardiovascular;  Laterality: N/A;   REVERSE SHOULDER ARTHROPLASTY Left 09/16/2021   Procedure: REVERSE SHOULDER ARTHROPLASTY;  Surgeon: Netta Cedars, MD;  Location: [facility];  Service: Orthopedics;  Laterality: Left;   Family History  Problem Relation Age of Onset   Hypertension Mother    CAD Brother        Deceased on MI age 84   Cancer - Other Father        Oropharyngeal SCCa    Social History   Tobacco Use   Smoking status: Heavy Smoker    Packs/day: 1.00    Types: Cigarettes   Smokeless tobacco: Never  Substance Use Topics   Alcohol use: Yes    Alcohol/week: 0.0 standard drinks    Comment: Occasional to Rare   Marital Status: Married   ROS  Review of Systems  Constitutional: Positive for malaise/fatigue.  Cardiovascular:  Negative for chest pain, claudication, leg swelling, near-syncope, orthopnea, palpitations, paroxysmal nocturnal dyspnea and syncope.  Respiratory:  Negative for shortness of breath.   Neurological:  Negative for dizziness.   Objective  Blood pressure (!) 135/46, pulse 80, height 5\' 3"  (1.6 m), weight 132 lb (59.9 kg), SpO2 92 %.  Vitals with BMI 10/14/2021 10/02/2021 10/02/2021  Height 5\' 3"  - -  Weight 132 lbs - -  BMI 0000000 - -  Systolic [id number] 0000000  Diastolic 46 88 55  Pulse 80 77 85      Physical Exam Vitals reviewed.  Cardiovascular:     Rate and Rhythm: Normal rate and regular rhythm.     Pulses: Intact distal pulses.  Popliteal pulses are 0 on the right side and 0 on the left side.       Dorsalis pedis pulses are 0 on the right side and 0 on the left side.       Posterior tibial pulses are 0 on the right side and 0 on the left side.     Heart sounds: S1 normal and S2 normal. Heart sounds are distant. No murmur heard.   No gallop.     Comments: Access site healing well with mild surrounding ecchymosis. No hematoma or bruit.  Pulmonary:     Effort: Pulmonary effort is normal.  No respiratory distress.     Breath sounds: No wheezing, rhonchi or rales.  Musculoskeletal:     Right lower leg: No edema.     Left lower leg: No edema.  Neurological:     Mental Status: She is alert.    Laboratory examination:   Recent Labs    09/29/21 0441 09/30/21 2329 10/02/21 0449  NA 140 137 134*  K 3.8 3.2* 4.1  CL 95* 97* 99  CO2 38* 32 28  GLUCOSE 144* 108* 116*  BUN 37* 23 13  CREATININE 0.96 0.90 0.91  CALCIUM 8.7* 7.8* 8.1*  GFRNONAA >60 >60 >60   estimated creatinine clearance is 51.7 mL/min (by C-G formula based on SCr of 0.91 mg/dL).  CMP Latest Ref Rng & Units 10/02/2021 09/30/2021 09/29/2021  Glucose 70 - 99 mg/dL 416(L) 845(X) 646(O)  BUN 8 - 23 mg/dL 13 23 03(O)  Creatinine 0.44 - 1.00 mg/dL 1.22 4.82 5.00  Sodium 135 - 145 mmol/L 134(L) 137 140  Potassium 3.5 - 5.1 mmol/L 4.1 3.2(L) 3.8  Chloride 98 - 111 mmol/L 99 97(L) 95(L)  CO2 22 - 32 mmol/L 28 32 38(H)  Calcium 8.9 - 10.3 mg/dL 8.1(L) 7.8(L) 8.7(L)  Total Protein 6.5 - 8.1 g/dL - - -  Total Bilirubin 0.3 - 1.2 mg/dL - - -  Alkaline Phos 38 - 126 U/L - - -  AST 15 - 41 U/L - - -  ALT 0 - 44 U/L - - -   CBC Latest Ref Rng & Units 10/02/2021 09/30/2021 09/26/2021  WBC 4.0 - 10.5 K/uL 22.0(H) 22.0(H) 21.7(H)  Hemoglobin 12.0 - 15.0 g/dL 3.7(C) 4.8(G) 8.1(L)  Hematocrit 36.0 - 46.0 % 27.6(L) 27.8(L) 25.5(L)  Platelets 150 - 400 K/uL 464(H) 483(H) 410(H)    Lipid Panel Recent Labs    09/24/21 0221  CHOL 161  TRIG 121  LDLCALC 110*  VLDL 24  HDL 27*  CHOLHDL 6.0    HEMOGLOBIN A1C Lab Results  Component Value Date   HGBA1C 6.7 (H) 09/17/2021   MPG 146 09/17/2021   TSH Recent Labs    09/24/21 0221  TSH 0.177*    External labs:   None   Allergies   Allergies  Allergen Reactions   Cefepime Anaphylaxis, Shortness Of Breath, Swelling and Dermatitis   Edoxaban Other (See Comments)    Causes hematomas   Penicillins Anaphylaxis   Barium Iodide Rash   Other     Other  reaction(s): Other Preservatives  (C-Diff) Other reaction(s): Unknown Preservatives  (C-Diff) Other reaction(s): OTHER   Acetaminophen     Will not take after Cyanide deaths, something changed when they mkt'd again.  Can take Childrens Liquid but sometimes upset stomach too   Albuterol Hypertension    ABLE TO TOLERATE IN INHALER FORM NOT NEBULIZER    Aspirin     Makes anemic  Beta Adrenergic Blockers     Paradoxical reactions   Diltiazem Other (See Comments)    Patient is intolerant to all CALCIUM CHANNEL BLOCKERS, causes her blood pressure to rise.    Lisinopril     Can only take her own mfr.     Oxycodone    Prednisone     REACTION: ibs probs   Propofol     REACTION: FEVER   Repatha [Evolocumab] Other (See Comments)    Knocked her out for 10 days   Statins     GI issues   Sulfamethoxazole-Trimethoprim     REACTION: antibiotic induced colitis   Lac Bovis Other (See Comments)    Medications Prior to Visit:   Outpatient Medications Prior to Visit  Medication Sig Dispense Refill   albuterol (PROVENTIL HFA;VENTOLIN HFA) 108 (90 BASE) MCG/ACT inhaler Inhale 2 puffs into the lungs 3 (three) times daily as needed for wheezing or shortness of breath.     aspirin 81 MG chewable tablet Chew 1 tablet (81 mg total) by mouth daily. 30 tablet 0   nicotine (NICODERM CQ - DOSED IN MG/24 HOURS) 21 mg/24hr patch Place 1 patch (21 mg total) onto the skin daily. 28 patch 0   oxyCODONE-acetaminophen (PERCOCET) 10-325 MG tablet Take 1 tablet by mouth every 4 (four) hours as needed for pain. 30 tablet 0   pantoprazole (PROTONIX) 40 MG tablet Take 1 tablet (40 mg total) by mouth 2 (two) times daily. 60 tablet 0   promethazine (PHENERGAN) 25 MG suppository Place 25 mg rectally daily as needed for nausea.     promethazine (PHENERGAN) 25 MG tablet Take 1 tablet (25 mg total) by mouth every 6 (six) hours as needed for nausea or vomiting. 60 tablet 0   STIOLTO RESPIMAT 2.5-2.5 MCG/ACT AERS Inhale 2  puffs into the lungs daily.     vitamin B-12 (CYANOCOBALAMIN) 1000 MCG tablet Take 1,000 mcg by mouth daily.     Vitamin D, Ergocalciferol, (DRISDOL) 1.25 MG (50000 UNIT) CAPS capsule Take 50,000 Units by mouth once a week.     apixaban (ELIQUIS) 5 MG TABS tablet Take 1 tablet (5 mg total) by mouth 2 (two) times daily. 60 tablet 0   atorvastatin (LIPITOR) 80 MG tablet Take 1 tablet (80 mg total) by mouth daily. 30 tablet 0   clopidogrel (PLAVIX) 75 MG tablet Take 1 tablet (75 mg total) by mouth daily. 30 tablet 0   dapagliflozin propanediol (FARXIGA) 10 MG TABS tablet Take 1 tablet (10 mg total) by mouth daily. 30 tablet 0   diltiazem (CARDIZEM CD) 120 MG 24 hr capsule Take 1 capsule (120 mg total) by mouth daily. 30 capsule 0   furosemide (LASIX) 20 MG tablet Take 20 mg by mouth.     methocarbamol (ROBAXIN) 500 MG tablet Take 1 tablet (500 mg total) by mouth every 8 (eight) hours as needed for muscle spasms. 40 tablet 1   metoprolol tartrate (LOPRESSOR) 50 MG tablet Take 1 tablet (50 mg total) by mouth 2 (two) times daily. 60 tablet 0   cyclobenzaprine (FLEXERIL) 5 MG tablet Take 1 tablet by mouth as needed.     NOVOLOG FLEXPEN 100 UNIT/ML FlexPen Inject 0-2 Units into the skin with breakfast, with lunch, and with evening meal. Sliding scale     No facility-administered medications prior to visit.   Final Medications at End of Visit    Current Meds  Medication Sig   albuterol (PROVENTIL HFA;VENTOLIN HFA) 108 (90 BASE) MCG/ACT inhaler Inhale  2 puffs into the lungs 3 (three) times daily as needed for wheezing or shortness of breath.   aspirin 81 MG chewable tablet Chew 1 tablet (81 mg total) by mouth daily.   nicotine (NICODERM CQ - DOSED IN MG/24 HOURS) 21 mg/24hr patch Place 1 patch (21 mg total) onto the skin daily.   oxyCODONE-acetaminophen (PERCOCET) 10-325 MG tablet Take 1 tablet by mouth every 4 (four) hours as needed for pain.   pantoprazole (PROTONIX) 40 MG tablet Take 1 tablet (40 mg  total) by mouth 2 (two) times daily.   promethazine (PHENERGAN) 25 MG suppository Place 25 mg rectally daily as needed for nausea.   promethazine (PHENERGAN) 25 MG tablet Take 1 tablet (25 mg total) by mouth every 6 (six) hours as needed for nausea or vomiting.   STIOLTO RESPIMAT 2.5-2.5 MCG/ACT AERS Inhale 2 puffs into the lungs daily.   vitamin B-12 (CYANOCOBALAMIN) 1000 MCG tablet Take 1,000 mcg by mouth daily.   Vitamin D, Ergocalciferol, (DRISDOL) 1.25 MG (50000 UNIT) CAPS capsule Take 50,000 Units by mouth once a week.   [DISCONTINUED] apixaban (ELIQUIS) 5 MG TABS tablet Take 1 tablet (5 mg total) by mouth 2 (two) times daily.   [DISCONTINUED] atorvastatin (LIPITOR) 80 MG tablet Take 1 tablet (80 mg total) by mouth daily.   [DISCONTINUED] clopidogrel (PLAVIX) 75 MG tablet Take 1 tablet (75 mg total) by mouth daily.   [DISCONTINUED] dapagliflozin propanediol (FARXIGA) 10 MG TABS tablet Take 1 tablet (10 mg total) by mouth daily.   [DISCONTINUED] diltiazem (CARDIZEM CD) 120 MG 24 hr capsule Take 1 capsule (120 mg total) by mouth daily.   [DISCONTINUED] furosemide (LASIX) 20 MG tablet Take 20 mg by mouth.   [DISCONTINUED] methocarbamol (ROBAXIN) 500 MG tablet Take 1 tablet (500 mg total) by mouth every 8 (eight) hours as needed for muscle spasms.   [DISCONTINUED] metoprolol tartrate (LOPRESSOR) 50 MG tablet Take 1 tablet (50 mg total) by mouth 2 (two) times daily.   Radiology:   No results found.  Cardiac Studies:   Echocardiogram 09/23/2021: 1. Left ventricular ejection fraction, by estimation, is 45 to 50%. Left  ventricular ejection fraction by PLAX is 54 %. The left ventricle has  normal function. The left ventricle demonstrates regional wall motion  abnormalities  Left ventricular diastolic parameters  are consistent with Grade III diastolic dysfunction (restrictive).  Elevated left ventricular end-diastolic pressure. There is mild  hypokinesis of the left ventricular, entire   inferoseptal wall, inferior wall and inferolateral wall.   2. Right ventricular systolic function is normal. The right ventricular  size is normal. There is moderately elevated pulmonary artery systolic pressure. The estimated right ventricular systolic pressure is 50.3 mmHg.   3. Left atrial size was mildly dilated.   4. Moderate to at most moderately severe posteriorly directed MR.  Consider papillary muscle dysfunction. The mitral valve is normal in  structure. Moderate to severe mitral valve regurgitation. No evidence of  mitral stenosis.   5. The aortic valve is normal in structure. Aortic valve regurgitation is not visualized. No aortic stenosis is present.   6. The inferior vena cava is dilated in size with <50% respiratory  variability, suggesting right atrial pressure of 15 mmHg.    Left Heart Catheterization 09/23/21:  RCA diffuse in the proximal and mid RCA with ulceration and scattered 80-99% stenosis.  Successful PCI to ulcerated high grade native RCA with implantation of 3.0x48 mm Synergy SD in the proximal to mid RCA. Timi 3 to 3  flow. LM diffuse 80% stenosis.  LAD patent with mild disease and LIMA to LAD patent.  CX: Moderate OM1 widely patent. Cx severely diseased after origin of OM 1 with 90-95% stenosis.  Small diseased OM-2 RI: Small with ostial mild disease.  SVG to OM1-2 skip graft occluded, SVG to RCA occluded.  LV: EF 35-40% with inferior akinesis. No MR.  Hemodynamics: LV 112/18, EDP 25 mm Hg. Ao 114/80, mean 112 mm Hg. No pressure gradient across the AV.  90 mL contrast used   Echocardiogram 03/24/2020:     1. Left ventricular ejection fraction, by estimation, is 55 to 60%. The  left ventricle has normal function. The left ventricle has no regional  wall motion abnormalities. There is moderate left ventricular hypertrophy.  Left ventricular diastolic parameters are consistent with Grade I diastolic dysfunction (impaired relaxation).   2. Right ventricular systolic  function is normal. The right ventricular  size is normal. Tricuspid regurgitation signal is inadequate for assessing PA pressure.   3. The mitral valve is normal in structure. No evidence of mitral valve regurgitation. No evidence of mitral stenosis.   4. The aortic valve is normal in structure. Aortic valve regurgitation is not visualized. No aortic stenosis is present.   5. The inferior vena cava is normal in size with greater than 50%  respiratory variability, suggesting right atrial pressure of 3 mmHg.   CABG 04/29/2016: LIMA to LAD, SVG to RCA, SVG to OM1, SVG to OM2 for left main 90% stenosis 50% stenosis in circumflex and 60% stenosis in OM1, 50% stenosis in proximal RCA.  EKG:   10/14/2021: Normal sinus rhythm at rate of 79 bpm.  Normal EKG.  No evidence of ischemia.  EKG 10/01/2021: Normal sinus rhythm.  Normal EKG.  No evidence of ischemia.   EKG 09/30/2021: Atrial fibrillation with rapid ventricular response at rate of 130 bpm, normal axis, no evidence of ischemia.  Normal QT interval.  Assessment     ICD-10-CM   1. Coronary artery disease involving native coronary artery of native heart with unstable angina pectoris (HCC)  I25.110 EKG 12-Lead    PCV ECHOCARDIOGRAM COMPLETE    2. Acute diastolic heart failure (HCC)  I50.31 PCV ECHOCARDIOGRAM COMPLETE       Medications Discontinued During This Encounter  Medication Reason   NOVOLOG FLEXPEN 100 UNIT/ML FlexPen    methocarbamol (ROBAXIN) 500 MG tablet Completed Course   atorvastatin (LIPITOR) 80 MG tablet Reorder   clopidogrel (PLAVIX) 75 MG tablet Reorder   diltiazem (CARDIZEM CD) 120 MG 24 hr capsule Reorder   metoprolol tartrate (LOPRESSOR) 50 MG tablet Reorder   apixaban (ELIQUIS) 5 MG TABS tablet Reorder   dapagliflozin propanediol (FARXIGA) 10 MG TABS tablet Reorder   furosemide (LASIX) 20 MG tablet Reorder    Meds ordered this encounter  Medications   apixaban (ELIQUIS) 5 MG TABS tablet    Sig: Take 1 tablet  (5 mg total) by mouth 2 (two) times daily.    Dispense:  180 tablet    Refill:  3   atorvastatin (LIPITOR) 80 MG tablet    Sig: Take 1 tablet (80 mg total) by mouth daily.    Dispense:  90 tablet    Refill:  3   clopidogrel (PLAVIX) 75 MG tablet    Sig: Take 1 tablet (75 mg total) by mouth daily.    Dispense:  90 tablet    Refill:  3   dapagliflozin propanediol (FARXIGA) 10 MG TABS tablet  Sig: Take 1 tablet (10 mg total) by mouth daily.    Dispense:  90 tablet    Refill:  3   diltiazem (CARDIZEM CD) 120 MG 24 hr capsule    Sig: Take 1 capsule (120 mg total) by mouth daily.    Dispense:  90 capsule    Refill:  3   furosemide (LASIX) 20 MG tablet    Sig: Take 1 tablet (20 mg total) by mouth daily.    Dispense:  90 tablet    Refill:  3   metoprolol tartrate (LOPRESSOR) 50 MG tablet    Sig: Take 1 tablet (50 mg total) by mouth 2 (two) times daily.    Dispense:  180 tablet    Refill:  3    Recommendations:   [name] is a 65 y.o. 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   Patient required admission to the ICU following cardiac catheterization with acute COPD exacerbation and heart failure exacerbation requiring ventilator support. Patient developed atrial fibrillation with RVR and was started on diltiazem, subsequently converted back to normal sinus rhythm. She recovered relatively well and was discharged 10/02/2021 with Eliquis and diltiazem, aspirin, plavix, lipitor, and metoprolol, Farxiga. Echocardiogram during admission showed moderate to severe mitral regurgitation with concern for papillary muscle dysfunction secondary to ischemia.   Patient now presents for follow up. She is tolerating current medications without issue. She is tolerating anticoagulation  without bleeding diathesis. Advised patient she may stop Aspirin on 11/02/2020, and continue Eliquis and Plavix. Will plan to continue Plavix until 09/2022 if able. I have also refilled cardiac medications as well as Lasix for as needed use. Advised patient to follow up with cardiac rehab.   Will obtain repeat echocardiogram following revascularization. Blood pressure is well controlled. Will hold off on addition of ARB at this time although can consider addition of this in the future.   Follow up in 4 weeks, sooner if needed.    Alethia Berthold, PA-C 10/19/2021, 2:20 PM Office: [phone]

## 2021-10-16 ENCOUNTER — Inpatient Hospital Stay: Payer: 59

## 2021-10-16 ENCOUNTER — Inpatient Hospital Stay: Payer: 59 | Admitting: Hematology and Oncology

## 2021-10-16 ENCOUNTER — Telehealth: Payer: Self-pay | Admitting: Hematology and Oncology

## 2021-10-16 NOTE — Telephone Encounter (Signed)
R/s pt's new pt appt with Dr. Leonides Schanz due to provider being out of the office today. Spoke to pt's husband who is aware appt has been r/s. He is aware of new appt date and time.

## 2021-10-19 ENCOUNTER — Encounter: Payer: Self-pay | Admitting: Student

## 2021-10-19 ENCOUNTER — Other Ambulatory Visit (HOSPITAL_COMMUNITY): Payer: Self-pay

## 2021-10-19 ENCOUNTER — Telehealth (HOSPITAL_COMMUNITY): Payer: Self-pay | Admitting: Pharmacy Technician

## 2021-10-19 NOTE — Telephone Encounter (Signed)
Transitions of Care Pharmacy  ° °Call attempted for a pharmacy transitions of care follow-up. HIPAA appropriate voicemail was left.  ° °Call attempt #1. Will follow-up in 2-3 days.  ° °Gibran Veselka, PharmD ° °  °

## 2021-10-20 ENCOUNTER — Telehealth (HOSPITAL_COMMUNITY): Payer: Self-pay

## 2021-10-20 ENCOUNTER — Other Ambulatory Visit (HOSPITAL_COMMUNITY): Payer: Self-pay

## 2021-10-20 NOTE — Telephone Encounter (Signed)
Transitions of Care Pharmacy   Call attempted for a pharmacy transitions of care follow-up. HIPAA appropriate voicemail was left with call back information provided.   Call attempt #2. Will follow-up in 2-3 days.    

## 2021-10-21 ENCOUNTER — Telehealth (HOSPITAL_COMMUNITY): Payer: Self-pay

## 2021-10-21 NOTE — Telephone Encounter (Signed)
Transitions of Care Pharmacy   Call attempted for a pharmacy transitions of care follow-up. HIPAA appropriate voicemail was left with call back information provided.   Call attempt #3. Will no longer attempt follow up for TOC pharmacy.   

## 2021-10-22 ENCOUNTER — Other Ambulatory Visit: Payer: Managed Care, Other (non HMO)

## 2021-11-02 ENCOUNTER — Inpatient Hospital Stay: Payer: Self-pay | Attending: Hematology and Oncology | Admitting: Hematology and Oncology

## 2021-11-02 ENCOUNTER — Inpatient Hospital Stay: Payer: Self-pay

## 2021-11-10 ENCOUNTER — Ambulatory Visit: Payer: Managed Care, Other (non HMO) | Admitting: Student

## 2021-11-11 ENCOUNTER — Other Ambulatory Visit: Payer: Self-pay | Admitting: Student

## 2021-11-12 ENCOUNTER — Inpatient Hospital Stay: Payer: 59 | Admitting: Pulmonary Disease

## 2021-11-20 ENCOUNTER — Ambulatory Visit (INDEPENDENT_AMBULATORY_CARE_PROVIDER_SITE_OTHER): Payer: Managed Care, Other (non HMO) | Admitting: Pulmonary Disease

## 2021-11-20 ENCOUNTER — Encounter: Payer: Self-pay | Admitting: Pulmonary Disease

## 2021-11-20 ENCOUNTER — Other Ambulatory Visit: Payer: Self-pay

## 2021-11-20 VITALS — BP 128/72 | HR 82 | Ht 63.0 in | Wt 119.8 lb

## 2021-11-20 DIAGNOSIS — J449 Chronic obstructive pulmonary disease, unspecified: Secondary | ICD-10-CM | POA: Diagnosis not present

## 2021-11-20 DIAGNOSIS — J9601 Acute respiratory failure with hypoxia: Secondary | ICD-10-CM | POA: Diagnosis not present

## 2021-11-20 MED ORDER — FLUTICASONE PROPIONATE HFA 110 MCG/ACT IN AERO: 2.0000 | INHALATION_SPRAY | Freq: Two times a day (BID) | RESPIRATORY_TRACT | 12 refills | Status: DC

## 2021-11-20 NOTE — Progress Notes (Signed)
Synopsis: Referred in February 2023 for Hospital Follow up   Subjective:   PATIENT ID: [name] GENDER: female DOB: [birth year], MRN: [id number]  HPI  Chief Complaint  Patient presents with   Hospitalization Follow-up    HFU. States she has been doing well since being home.    [name] is a 66 year old woman, daily smoker with COPD. DMII, GERD and hypertension who is referred to pulmonary clinic for hospital follow up for COPD exacerbation and pneumonia.   She was admitted 12/7 - 12/16 for STEMI s/p PCI to RCA, atrial fibrillation with RVR, and acute respiratory failure in setting of COPD exacerbation and pneumonia. Echo demonstarted moderate to severe mitral regurgitation concerning for papillary muscle dysfunction secondary to ischemia.   She was discharged home on 1L of home O2 and stiolto inhaler with as needed albuterol inhaler. Stopped using oxygen during day 1 week ago due to a sore in her nose. She continues to use O2 at night when sleeping  She has issues with allergies/molds so depending on the season she has harder time with her breathing. Perfumes can trigger her breathing. She avoids aerosol products, as these have bothered her breathing since childhood. She was treated for asthma when she was younger.   She has not smoked since the hospital admission. She is on 7mg  nicotine patches as needed and using nicotine lozenges as needed. She lives with her husband on a [place] farm. Her physical activity is limited due to sciatica symptoms.  Past Medical History:  Diagnosis Date   Asthma    Benign essential HTN    COPD (chronic obstructive pulmonary disease) (HCC)    IBS (irritable bowel syndrome)    Migraine headache      Family History  Problem Relation Age of Onset   Hypertension Mother    CAD Brother        Deceased on MI age 43   Cancer - Other Father        Oropharyngeal SCCa     Social History   Socioeconomic History   Marital status: Married     Spouse name: Not on file   Number of children: Not on file   Years of education: Not on file   Highest education level: Not on file  Occupational History   Not on file  Tobacco Use   Smoking status: Heavy Smoker    Packs/day: 1.00    Types: Cigarettes   Smokeless tobacco: Never  Vaping Use   Vaping Use: Never used  Substance and Sexual Activity   Alcohol use: Yes    Alcohol/week: 0.0 standard drinks    Comment: Occasional to Rare   Drug use: No   Sexual activity: Not on file  Other Topics Concern   Not on file  Social History Narrative   Not on file   Social Determinants of Health   Financial Resource Strain: Not on file  Food Insecurity: Not on file  Transportation Needs: Not on file  Physical Activity: Not on file  Stress: Not on file  Social Connections: Not on file  Intimate Partner Violence: Not on file     Allergies  Allergen Reactions   Cefepime Anaphylaxis, Shortness Of Breath, Swelling and Dermatitis   Edoxaban Other (See Comments)    Causes hematomas   Penicillins Anaphylaxis   Barium Iodide Rash   Other     Other reaction(s): Other Preservatives  (C-Diff) Other reaction(s): Unknown Preservatives  (C-Diff) Other reaction(s): OTHER  Acetaminophen     Will not take after Cyanide deaths, something changed when they mkt'd again.  Can take Childrens Liquid but sometimes upset stomach too   Albuterol Hypertension    ABLE TO TOLERATE IN INHALER FORM NOT NEBULIZER    Aspirin     Makes anemic   Beta Adrenergic Blockers     Paradoxical reactions   Diltiazem Other (See Comments)    Patient is intolerant to all CALCIUM CHANNEL BLOCKERS, causes her blood pressure to rise.    Lisinopril     Can only take her own mfr.     Oxycodone    Prednisone     REACTION: ibs probs   Propofol     REACTION: FEVER   Repatha [Evolocumab] Other (See Comments)    Knocked her out for 10 days   Statins     GI issues   Sulfamethoxazole-Trimethoprim     REACTION:  antibiotic induced colitis   Lac Bovis Other (See Comments)     Outpatient Medications Prior to Visit  Medication Sig Dispense Refill   albuterol (PROVENTIL HFA;VENTOLIN HFA) 108 (90 BASE) MCG/ACT inhaler Inhale 2 puffs into the lungs 3 (three) times daily as needed for wheezing or shortness of breath.     apixaban (ELIQUIS) 5 MG TABS tablet Take 1 tablet (5 mg total) by mouth 2 (two) times daily. 180 tablet 3   atorvastatin (LIPITOR) 80 MG tablet Take 1 tablet (80 mg total) by mouth daily. 90 tablet 3   clopidogrel (PLAVIX) 75 MG tablet Take 1 tablet (75 mg total) by mouth daily. 90 tablet 3   cyclobenzaprine (FLEXERIL) 5 MG tablet Take 1 tablet by mouth as needed.     dapagliflozin propanediol (FARXIGA) 10 MG TABS tablet Take 1 tablet (10 mg total) by mouth daily. 90 tablet 3   diltiazem (CARDIZEM CD) 120 MG 24 hr capsule Take 1 capsule (120 mg total) by mouth daily. 90 capsule 3   furosemide (LASIX) 20 MG tablet Take 1 tablet (20 mg total) by mouth daily. 90 tablet 3   metoprolol tartrate (LOPRESSOR) 50 MG tablet Take 1 tablet (50 mg total) by mouth 2 (two) times daily. 180 tablet 3   oxyCODONE-acetaminophen (PERCOCET) 10-325 MG tablet Take 1 tablet by mouth every 4 (four) hours as needed for pain. 30 tablet 0   pantoprazole (PROTONIX) 40 MG tablet Take 1 tablet (40 mg total) by mouth 2 (two) times daily. 60 tablet 0   promethazine (PHENERGAN) 25 MG suppository Place 25 mg rectally daily as needed for nausea.     promethazine (PHENERGAN) 25 MG tablet Take 1 tablet (25 mg total) by mouth every 6 (six) hours as needed for nausea or vomiting. 60 tablet 0   STIOLTO RESPIMAT 2.5-2.5 MCG/ACT AERS Inhale 2 puffs into the lungs daily.     vitamin B-12 (CYANOCOBALAMIN) 1000 MCG tablet Take 1,000 mcg by mouth daily.     Vitamin D, Ergocalciferol, (DRISDOL) 1.25 MG (50000 UNIT) CAPS capsule Take 50,000 Units by mouth once a week.     aspirin 81 MG chewable tablet Chew 1 tablet (81 mg total) by mouth  daily. 30 tablet 0   nicotine (NICODERM CQ - DOSED IN MG/24 HOURS) 21 mg/24hr patch Place 1 patch (21 mg total) onto the skin daily. 28 patch 0   No facility-administered medications prior to visit.   Review of Systems  Constitutional:  Negative for chills, fever, malaise/fatigue and weight loss.  HENT:  Negative for congestion, sinus pain and  sore throat.   Eyes: Negative.   Respiratory:  Positive for shortness of breath. Negative for cough, hemoptysis, sputum production and wheezing.   Cardiovascular:  Negative for chest pain, palpitations, orthopnea, claudication and leg swelling.  Gastrointestinal:  Negative for abdominal pain, heartburn, nausea and vomiting.  Genitourinary: Negative.   Musculoskeletal:  Negative for joint pain and myalgias.       Shoulder pain  Skin:  Negative for rash.  Neurological:  Negative for weakness.  Endo/Heme/Allergies: Negative.   Psychiatric/Behavioral: Negative.     Objective:   Vitals:   11/20/21 1004  BP: 128/72  Pulse: 82  SpO2: 98%  Weight: 119 lb 12.8 oz (54.3 kg)  Height: 5\' 3"  (1.6 m)   Physical Exam Constitutional:      General: She is not in acute distress.    Appearance: She is not ill-appearing.  HENT:     Head: Normocephalic and atraumatic.  Eyes:     General: No scleral icterus.    Conjunctiva/sclera: Conjunctivae normal.     Pupils: Pupils are equal, round, and reactive to light.  Cardiovascular:     Rate and Rhythm: Normal rate and regular rhythm.     Pulses: Normal pulses.     Heart sounds: Normal heart sounds. No murmur heard. Pulmonary:     Effort: Pulmonary effort is normal.     Breath sounds: Normal breath sounds. Decreased air movement present. No wheezing, rhonchi or rales.  Abdominal:     General: Bowel sounds are normal.     Palpations: Abdomen is soft.  Musculoskeletal:     Right lower leg: No edema.     Left lower leg: No edema.  Lymphadenopathy:     Cervical: No cervical adenopathy.  Skin:     General: Skin is warm and dry.  Neurological:     General: No focal deficit present.     Mental Status: She is alert.  Psychiatric:        Mood and Affect: Mood normal.        Behavior: Behavior normal.        Thought Content: Thought content normal.        Judgment: Judgment normal.    CBC    Component Value Date/Time   WBC 22.0 (H) 10/02/2021 0449   RBC 3.03 (L) 10/02/2021 0449   HGB 8.4 (L) 10/02/2021 0449   HCT 27.6 (L) 10/02/2021 0449   PLT 464 (H) 10/02/2021 0449   MCV 91.1 10/02/2021 0449   MCH 27.7 10/02/2021 0449   MCHC 30.4 10/02/2021 0449   RDW 15.4 10/02/2021 0449   LYMPHSABS 1.5 10/02/2021 0449   MONOABS 2.2 (H) 10/02/2021 0449   EOSABS 0.1 10/02/2021 0449   BASOSABS 0.1 10/02/2021 0449   BMP Latest Ref Rng & Units 10/02/2021 09/30/2021 09/29/2021  Glucose 70 - 99 mg/dL 116(H) 108(H) 144(H)  BUN 8 - 23 mg/dL 13 23 37(H)  Creatinine 0.44 - 1.00 mg/dL 0.91 0.90 0.96  Sodium 135 - 145 mmol/L 134(L) 137 140  Potassium 3.5 - 5.1 mmol/L 4.1 3.2(L) 3.8  Chloride 98 - 111 mmol/L 99 97(L) 95(L)  CO2 22 - 32 mmol/L 28 32 38(H)  Calcium 8.9 - 10.3 mg/dL 8.1(L) 7.8(L) 8.7(L)   Chest imaging: CTA Chest 10/01/21 1. No evidence of pulmonary embolism. 2. Cardiomegaly with small bilateral pleural effusions lying dependently and some passive subsegmental atelectasis in the lower lobes of the lungs bilaterally. 3. Patchy areas of ground-glass attenuation in the lungs, most evident in the  left upper lobe near the apex. These may be of infectious or inflammatory etiology. 4. Aortic atherosclerosis, in addition to three-vessel coronary artery disease. Status post median sternotomy for CABG including LIMA to the LAD. 5. Moderate-sized hiatal hernia. 6. Additional incidental findings, as above.  PFT: No flowsheet data found.  Labs:  Path:  Echo 09/23/21: LV EF 45-50%. Grade III diastolic dysfunction. RV size and function is normal. RVSP 50.62mmHg. Moderate to  moderately severe posteriorly directed MR.   Heart Catheterization:  Assessment & Plan:   Acute hypoxemic respiratory failure (HCC) - Plan: Pulse oximetry, overnight  Asthma-COPD overlap syndrome ([place]) - Plan: Pulse oximetry, overnight  Discussion: [name] is a 66 year old woman, daily smoker with COPD. DMII, GERD and hypertension who is referred to pulmonary clinic for hospital follow up for COPD exacerbation and pneumonia.   She has recovered well since her hospitalization. She is to continue stiolto 2 puffs daily and will add flovent 161mcg 2 puffs twice daily for her asthma component to her COPD.   She is to continue O2 at night and we will check an overnight oximitry test on room air.   She has deferred simple walk in clinic today due to her sciatica pain.   She will need referral to lung cancer screening program in the future.  Follow up in 4 months with pulmonary function tests.  Freda Jackson, MD [facility] Office: [phone]   Current Outpatient Medications:    albuterol (PROVENTIL HFA;VENTOLIN HFA) 108 (90 BASE) MCG/ACT inhaler, Inhale 2 puffs into the lungs 3 (three) times daily as needed for wheezing or shortness of breath., Disp: , Rfl:    apixaban (ELIQUIS) 5 MG TABS tablet, Take 1 tablet (5 mg total) by mouth 2 (two) times daily., Disp: 180 tablet, Rfl: 3   atorvastatin (LIPITOR) 80 MG tablet, Take 1 tablet (80 mg total) by mouth daily., Disp: 90 tablet, Rfl: 3   clopidogrel (PLAVIX) 75 MG tablet, Take 1 tablet (75 mg total) by mouth daily., Disp: 90 tablet, Rfl: 3   cyclobenzaprine (FLEXERIL) 5 MG tablet, Take 1 tablet by mouth as needed., Disp: , Rfl:    dapagliflozin propanediol (FARXIGA) 10 MG TABS tablet, Take 1 tablet (10 mg total) by mouth daily., Disp: 90 tablet, Rfl: 3   diltiazem (CARDIZEM CD) 120 MG 24 hr capsule, Take 1 capsule (120 mg total) by mouth daily., Disp: 90 capsule, Rfl: 3   furosemide (LASIX) 20 MG tablet,  Take 1 tablet (20 mg total) by mouth daily., Disp: 90 tablet, Rfl: 3   metoprolol tartrate (LOPRESSOR) 50 MG tablet, Take 1 tablet (50 mg total) by mouth 2 (two) times daily., Disp: 180 tablet, Rfl: 3   oxyCODONE-acetaminophen (PERCOCET) 10-325 MG tablet, Take 1 tablet by mouth every 4 (four) hours as needed for pain., Disp: 30 tablet, Rfl: 0   pantoprazole (PROTONIX) 40 MG tablet, Take 1 tablet (40 mg total) by mouth 2 (two) times daily., Disp: 60 tablet, Rfl: 0   promethazine (PHENERGAN) 25 MG suppository, Place 25 mg rectally daily as needed for nausea., Disp: , Rfl:    promethazine (PHENERGAN) 25 MG tablet, Take 1 tablet (25 mg total) by mouth every 6 (six) hours as needed for nausea or vomiting., Disp: 60 tablet, Rfl: 0   STIOLTO RESPIMAT 2.5-2.5 MCG/ACT AERS, Inhale 2 puffs into the lungs daily., Disp: , Rfl:    vitamin B-12 (CYANOCOBALAMIN) 1000 MCG tablet, Take 1,000 mcg by mouth daily., Disp: , Rfl:  Vitamin D, Ergocalciferol, (DRISDOL) 1.25 MG (50000 UNIT) CAPS capsule, Take 50,000 Units by mouth once a week., Disp: , Rfl:

## 2021-11-20 NOTE — Patient Instructions (Addendum)
We will check an overnight oximitry test on room air  Continue stiolto 2 puffs daily  Start flovent 165mcg 2 puffs twice daily - rinse mouth out after each use  Follow up in 4 months for pulmonary function tests

## 2021-11-23 ENCOUNTER — Ambulatory Visit: Payer: Managed Care, Other (non HMO) | Admitting: Student

## 2021-11-23 ENCOUNTER — Other Ambulatory Visit: Payer: Self-pay

## 2021-11-23 ENCOUNTER — Encounter: Payer: Self-pay | Admitting: Student

## 2021-11-23 ENCOUNTER — Ambulatory Visit: Payer: Managed Care, Other (non HMO)

## 2021-11-23 VITALS — BP 148/62 | HR 82 | Temp 97.2°F | Ht 63.0 in | Wt 119.8 lb

## 2021-11-23 DIAGNOSIS — I5031 Acute diastolic (congestive) heart failure: Secondary | ICD-10-CM

## 2021-11-23 DIAGNOSIS — I10 Essential (primary) hypertension: Secondary | ICD-10-CM

## 2021-11-23 DIAGNOSIS — I25110 Atherosclerotic heart disease of native coronary artery with unstable angina pectoris: Secondary | ICD-10-CM

## 2021-11-23 DIAGNOSIS — I340 Nonrheumatic mitral (valve) insufficiency: Secondary | ICD-10-CM

## 2021-11-23 NOTE — Progress Notes (Signed)
Primary Physician/Referring:  Elenore Paddy, NP  Patient ID: [name], female    DOB: [birth year], 66 y.o.   MRN: [id number]  Chief Complaint  Patient presents with   Coronary Artery Disease   Results   HPI:    [name]  is a 66 y.o. 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   Patient required admission to the ICU following cardiac catheterization with acute COPD exacerbation and heart failure exacerbation requiring ventilator support. Patient developed atrial fibrillation with RVR and subsequently converted to normal sinus rhythm.  Echocardiogram during admission showed moderate to severe MR with concern of papillary muscle dysfunction secondary to ischemia.    Patient presents for 4-week follow-up.  Last office visit discontinued aspirin and advised her to continue just Eliquis and Plavix.  Also ordered repeat echocardiogram to reevaluate mitral regurgitation.  Results of echocardiogram are pending.  Patient has been feeling well overall without specific complaints or concerns today.  She does continue to have shoulder pain which is chronic.  She reports home blood pressure readings averaging 120/76 mmHg.  Although blood pressure is elevated in the office today, and is likely secondary to shoulder pain.  Past Medical History:  Diagnosis Date   Asthma    Benign essential HTN    COPD (chronic obstructive pulmonary disease) (HCC)    IBS (irritable bowel syndrome)    Migraine headache    Past Surgical History:  Procedure Laterality Date   CORONARY/GRAFT ACUTE MI REVASCULARIZATION N/A 09/22/2021   Procedure: Coronary/Graft Acute MI Revascularization;  Surgeon: Adrian Prows, MD;  Location: [facility];  Service: Cardiovascular;  Laterality: N/A;   LEFT  HEART CATH AND CORONARY ANGIOGRAPHY N/A 09/22/2021   Procedure: LEFT HEART CATH AND CORONARY ANGIOGRAPHY;  Surgeon: Adrian Prows, MD;  Location: [facility];  Service: Cardiovascular;  Laterality: N/A;   REVERSE SHOULDER ARTHROPLASTY Left 09/16/2021   Procedure: REVERSE SHOULDER ARTHROPLASTY;  Surgeon: Netta Cedars, MD;  Location: [facility];  Service: Orthopedics;  Laterality: Left;   Family History  Problem Relation Age of Onset   Hypertension Mother    CAD Brother        Deceased on MI age 9   Cancer - Other Father        Oropharyngeal SCCa    Social History   Tobacco Use   Smoking status: Heavy Smoker    Packs/day: 1.00    Types: Cigarettes   Smokeless tobacco: Never  Substance Use Topics   Alcohol use: Yes    Alcohol/week: 0.0 standard drinks    Comment: Occasional to Rare   Marital Status: Married   ROS  Review of Systems  Constitutional: Negative for malaise/fatigue.  Cardiovascular:  Negative for chest pain, claudication, leg swelling, near-syncope, orthopnea, palpitations, paroxysmal nocturnal dyspnea and syncope.  Respiratory:  Positive for wheezing (started this morning). Negative for shortness of breath.   Neurological:  Negative for dizziness.   Objective  Blood pressure (!) 148/62, pulse 82, temperature (!) 97.2 F (36.2 C), temperature source Temporal, height 5\' 3"  (1.6 m), weight 119 lb 12.8 oz (54.3 kg), SpO2 97 %.  Vitals with BMI 11/23/2021 11/20/2021 10/14/2021  Height 5\' 3"  5\' 3"  5\' 3"   Weight 119 lbs 13 oz 119 lbs 13 oz 132 lbs  BMI 21.23 21.23 23.39  Systolic 148 128 832  Diastolic 62 72 46  Pulse 82 82 80      Physical Exam Vitals reviewed.  Cardiovascular:     Rate and Rhythm: Normal rate and regular rhythm.     Pulses: Intact distal pulses.          Popliteal pulses are 0 on the right side and 0 on the left side.       Dorsalis pedis pulses are 0 on the right side and 0 on the left side.       Posterior tibial pulses are 0 on the right side  and 0 on the left side.     Heart sounds: S1 normal and S2 normal. Heart sounds are distant. No murmur heard.   No gallop.  Pulmonary:     Effort: Pulmonary effort is normal. No respiratory distress.     Breath sounds: Wheezing (expiratory throughout bilaterally) present. No rhonchi or rales.  Musculoskeletal:     Right lower leg: No edema.     Left lower leg: No edema.  Neurological:     Mental Status: She is alert.    Laboratory examination:   Recent Labs    09/29/21 0441 09/30/21 2329 10/02/21 0449  NA 140 137 134*  K 3.8 3.2* 4.1  CL 95* 97* 99  CO2 38* 32 28  GLUCOSE 144* 108* 116*  BUN 37* 23 13  CREATININE 0.96 0.90 0.91  CALCIUM 8.7* 7.8* 8.1*  GFRNONAA >60 >60 >60   CrCl cannot be calculated (Patient's most recent lab result is older than the maximum 21 days allowed.).  CMP Latest Ref Rng & Units 10/02/2021 09/30/2021 09/29/2021  Glucose 70 - 99 mg/dL 549(I) 264(B) 583(E)  BUN 8 - 23 mg/dL 13 23 94(M)  Creatinine 0.44 - 1.00 mg/dL 7.68 0.88 1.10  Sodium 135 - 145 mmol/L 134(L) 137 140  Potassium 3.5 - 5.1 mmol/L 4.1 3.2(L) 3.8  Chloride 98 - 111 mmol/L 99 97(L) 95(L)  CO2 22 - 32 mmol/L 28 32 38(H)  Calcium 8.9 - 10.3 mg/dL 8.1(L) 7.8(L) 8.7(L)  Total Protein 6.5 - 8.1 g/dL - - -  Total Bilirubin 0.3 - 1.2 mg/dL - - -  Alkaline Phos 38 - 126 U/L - - -  AST 15 - 41 U/L - - -  ALT 0 - 44 U/L - - -   CBC Latest Ref Rng & Units 10/02/2021 09/30/2021 09/26/2021  WBC 4.0 - 10.5 K/uL 22.0(H) 22.0(H) 21.7(H)  Hemoglobin 12.0 - 15.0 g/dL 3.1(R) 9.4(V) 8.1(L)  Hematocrit 36.0 - 46.0 % 27.6(L) 27.8(L) 25.5(L)  Platelets 150 - 400 K/uL 464(H) 483(H) 410(H)    Lipid Panel Recent Labs    09/24/21 0221  CHOL 161  TRIG 121  LDLCALC 110*  VLDL 24  HDL 27*  CHOLHDL 6.0    HEMOGLOBIN A1C Lab Results  Component Value Date   HGBA1C 6.7 (H) 09/17/2021   MPG 146 09/17/2021   TSH Recent Labs    09/24/21 0221  TSH 0.177*    External labs:   None    Allergies   Allergies  Allergen Reactions   Cefepime Anaphylaxis, Shortness Of Breath, Swelling and Dermatitis   Edoxaban Other (See Comments)    Causes hematomas   Penicillins Anaphylaxis   Barium Iodide Rash   Other     Other reaction(s): Other Preservatives  (C-Diff) Other reaction(s): Unknown Preservatives  (C-Diff) Other reaction(s): OTHER   Acetaminophen     Will not take after Cyanide deaths,  something changed when they mkt'd again.  Can take Childrens Liquid but sometimes upset stomach too   Albuterol Hypertension    ABLE TO TOLERATE IN INHALER FORM NOT NEBULIZER    Aspirin     Makes anemic   Beta Adrenergic Blockers     Paradoxical reactions   Diltiazem Other (See Comments)    Patient is intolerant to all CALCIUM CHANNEL BLOCKERS, causes her blood pressure to rise.    Lisinopril     Can only take her own mfr.     Oxycodone    Prednisone     REACTION: ibs probs   Propofol     REACTION: FEVER   Repatha [Evolocumab] Other (See Comments)    Knocked her out for 10 days   Statins     GI issues   Sulfamethoxazole-Trimethoprim     REACTION: antibiotic induced colitis   Lac Bovis Other (See Comments)    Medications Prior to Visit:   Outpatient Medications Prior to Visit  Medication Sig Dispense Refill   albuterol (PROVENTIL HFA;VENTOLIN HFA) 108 (90 BASE) MCG/ACT inhaler Inhale 2 puffs into the lungs 3 (three) times daily as needed for wheezing or shortness of breath.     apixaban (ELIQUIS) 5 MG TABS tablet Take 1 tablet (5 mg total) by mouth 2 (two) times daily. 180 tablet 3   atorvastatin (LIPITOR) 80 MG tablet Take 1 tablet (80 mg total) by mouth daily. 90 tablet 3   clopidogrel (PLAVIX) 75 MG tablet Take 1 tablet (75 mg total) by mouth daily. 90 tablet 3   cyclobenzaprine (FLEXERIL) 5 MG tablet Take 1 tablet by mouth as needed.     dapagliflozin propanediol (FARXIGA) 10 MG TABS tablet Take 1 tablet (10 mg total) by mouth daily. 90 tablet 3   diltiazem  (CARDIZEM CD) 120 MG 24 hr capsule Take 1 capsule (120 mg total) by mouth daily. 90 capsule 3   fluticasone (FLOVENT HFA) 110 MCG/ACT inhaler Inhale 2 puffs into the lungs 2 (two) times daily. 1 each 12   furosemide (LASIX) 20 MG tablet Take 1 tablet (20 mg total) by mouth daily. 90 tablet 3   metoprolol tartrate (LOPRESSOR) 50 MG tablet Take 1 tablet (50 mg total) by mouth 2 (two) times daily. 180 tablet 3   oxyCODONE-acetaminophen (PERCOCET) 10-325 MG tablet Take 1 tablet by mouth every 4 (four) hours as needed for pain. 30 tablet 0   pantoprazole (PROTONIX) 40 MG tablet Take 1 tablet (40 mg total) by mouth 2 (two) times daily. 60 tablet 0   promethazine (PHENERGAN) 25 MG suppository Place 25 mg rectally daily as needed for nausea.     promethazine (PHENERGAN) 25 MG tablet Take 1 tablet (25 mg total) by mouth every 6 (six) hours as needed for nausea or vomiting. 60 tablet 0   STIOLTO RESPIMAT 2.5-2.5 MCG/ACT AERS Inhale 2 puffs into the lungs daily.     vitamin B-12 (CYANOCOBALAMIN) 1000 MCG tablet Take 1,000 mcg by mouth daily.     Vitamin D, Ergocalciferol, (DRISDOL) 1.25 MG (50000 UNIT) CAPS capsule Take 50,000 Units by mouth once a week.     No facility-administered medications prior to visit.   Final Medications at End of Visit    Current Meds  Medication Sig   albuterol (PROVENTIL HFA;VENTOLIN HFA) 108 (90 BASE) MCG/ACT inhaler Inhale 2 puffs into the lungs 3 (three) times daily as needed for wheezing or shortness of breath.   apixaban (ELIQUIS) 5 MG TABS tablet Take 1 tablet (  5 mg total) by mouth 2 (two) times daily.   atorvastatin (LIPITOR) 80 MG tablet Take 1 tablet (80 mg total) by mouth daily.   clopidogrel (PLAVIX) 75 MG tablet Take 1 tablet (75 mg total) by mouth daily.   cyclobenzaprine (FLEXERIL) 5 MG tablet Take 1 tablet by mouth as needed.   dapagliflozin propanediol (FARXIGA) 10 MG TABS tablet Take 1 tablet (10 mg total) by mouth daily.   diltiazem (CARDIZEM CD) 120 MG 24  hr capsule Take 1 capsule (120 mg total) by mouth daily.   fluticasone (FLOVENT HFA) 110 MCG/ACT inhaler Inhale 2 puffs into the lungs 2 (two) times daily.   furosemide (LASIX) 20 MG tablet Take 1 tablet (20 mg total) by mouth daily.   metoprolol tartrate (LOPRESSOR) 50 MG tablet Take 1 tablet (50 mg total) by mouth 2 (two) times daily.   oxyCODONE-acetaminophen (PERCOCET) 10-325 MG tablet Take 1 tablet by mouth every 4 (four) hours as needed for pain.   pantoprazole (PROTONIX) 40 MG tablet Take 1 tablet (40 mg total) by mouth 2 (two) times daily.   promethazine (PHENERGAN) 25 MG suppository Place 25 mg rectally daily as needed for nausea.   promethazine (PHENERGAN) 25 MG tablet Take 1 tablet (25 mg total) by mouth every 6 (six) hours as needed for nausea or vomiting.   STIOLTO RESPIMAT 2.5-2.5 MCG/ACT AERS Inhale 2 puffs into the lungs daily.   vitamin B-12 (CYANOCOBALAMIN) 1000 MCG tablet Take 1,000 mcg by mouth daily.   Vitamin D, Ergocalciferol, (DRISDOL) 1.25 MG (50000 UNIT) CAPS capsule Take 50,000 Units by mouth once a week.   Radiology:   No results found.  Cardiac Studies:   Echocardiogram 11/23/2021: results pending   Echocardiogram 09/23/2021: 1. Left ventricular ejection fraction, by estimation, is 45 to 50%. Left  ventricular ejection fraction by PLAX is 54 %. The left ventricle has  normal function. The left ventricle demonstrates regional wall motion  abnormalities  Left ventricular diastolic parameters  are consistent with Grade III diastolic dysfunction (restrictive).  Elevated left ventricular end-diastolic pressure. There is mild  hypokinesis of the left ventricular, entire  inferoseptal wall, inferior wall and inferolateral wall.   2. Right ventricular systolic function is normal. The right ventricular  size is normal. There is moderately elevated pulmonary artery systolic pressure. The estimated right ventricular systolic pressure is 0000000 mmHg.   3. Left atrial size was  mildly dilated.   4. Moderate to at most moderately severe posteriorly directed MR.  Consider papillary muscle dysfunction. The mitral valve is normal in  structure. Moderate to severe mitral valve regurgitation. No evidence of  mitral stenosis.   5. The aortic valve is normal in structure. Aortic valve regurgitation is not visualized. No aortic stenosis is present.   6. The inferior vena cava is dilated in size with <50% respiratory  variability, suggesting right atrial pressure of 15 mmHg.    Left Heart Catheterization 09/23/21:  RCA diffuse in the proximal and mid RCA with ulceration and scattered 80-99% stenosis.  Successful PCI to ulcerated high grade native RCA with implantation of 3.0x48 mm Synergy SD in the proximal to mid RCA. Timi 3 to 3 flow. LM diffuse 80% stenosis.  LAD patent with mild disease and LIMA to LAD patent.  CX: Moderate OM1 widely patent. Cx severely diseased after origin of OM 1 with 90-95% stenosis.  Small diseased OM-2 RI: Small with ostial mild disease.  SVG to OM1-2 skip graft occluded, SVG to RCA occluded.  LV: EF  35-40% with inferior akinesis. No MR.  Hemodynamics: LV 112/18, EDP 25 mm Hg. Ao 114/80, mean 112 mm Hg. No pressure gradient across the AV.  90 mL contrast used   Echocardiogram 03/24/2020:     1. Left ventricular ejection fraction, by estimation, is 55 to 60%. The  left ventricle has normal function. The left ventricle has no regional  wall motion abnormalities. There is moderate left ventricular hypertrophy.  Left ventricular diastolic parameters are consistent with Grade I diastolic dysfunction (impaired relaxation).   2. Right ventricular systolic function is normal. The right ventricular  size is normal. Tricuspid regurgitation signal is inadequate for assessing PA pressure.   3. The mitral valve is normal in structure. No evidence of mitral valve regurgitation. No evidence of mitral stenosis.   4. The aortic valve is normal in structure. Aortic  valve regurgitation is not visualized. No aortic stenosis is present.   5. The inferior vena cava is normal in size with greater than 50%  respiratory variability, suggesting right atrial pressure of 3 mmHg.   CABG 04/29/2016: LIMA to LAD, SVG to RCA, SVG to OM1, SVG to OM2 for left main 90% stenosis 50% stenosis in circumflex and 60% stenosis in OM1, 50% stenosis in proximal RCA.  EKG:   10/14/2021: Normal sinus rhythm at rate of 79 bpm.  Normal EKG.  No evidence of ischemia.  EKG 10/01/2021: Normal sinus rhythm.  Normal EKG.  No evidence of ischemia.   EKG 09/30/2021: Atrial fibrillation with rapid ventricular response at rate of 130 bpm, normal axis, no evidence of ischemia.  Normal QT interval.  Assessment     ICD-10-CM   1. Coronary artery disease involving native coronary artery of native heart with unstable angina pectoris ([place])  I25.110     2. Moderate to severe mitral regurgitation  I34.0     3. Essential hypertension  I10        There are no discontinued medications.   No orders of the defined types were placed in this encounter.   Recommendations:   MONTRELL TOOMES is a 66 y.o. 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   Patient required admission to the ICU following cardiac catheterization with acute COPD exacerbation and heart failure exacerbation requiring ventilator support. Patient developed atrial fibrillation with RVR and subsequently converted to normal sinus rhythm.  Echocardiogram during admission showed moderate to severe MR with concern of papillary muscle dysfunction secondary to ischemia.    Patient presents for 4-week follow-up.  Results of echocardiogram done today are pending, will plan to call patient with results and further  recommendations.  Although patient's blood pressure is elevated in the office today is likely secondary to shoulder pain and blood pressure is typically well controlled at home.  Will not make changes to medications at this time.  On exam patient does have expiratory wheezes bilaterally throughout her lung fields.  Advised patient to use albuterol inhaler when she is able to at home.  She follows closely with pulmonology for management of underlying COPD and asthma.  She continues to tolerate Eliquis and Plavix without bleeding diathesis, will continue this.  She has had no known recurrence of atrial fibrillation.  He is requesting new referral to cardiac rehab, we will follow-up on this.  Follow-up in 6 months, sooner if needed,  for CAD, hypertension, hyperlipidemia.   Alethia Berthold, PA-C 11/23/2021, 1:11 PM Office: [phone]

## 2021-11-27 ENCOUNTER — Other Ambulatory Visit: Payer: Self-pay

## 2021-11-27 MED ORDER — APIXABAN 5 MG PO TABS: 5.0000 mg | ORAL_TABLET | Freq: Two times a day (BID) | ORAL | 3 refills | Status: DC

## 2021-12-01 ENCOUNTER — Telehealth: Payer: Self-pay | Admitting: Hematology and Oncology

## 2021-12-01 NOTE — Telephone Encounter (Signed)
R/s pt's missed new hem appt with Dr. Leonides Schanz. Pt is aware of new appt date and time. Pt is aware to arrive 15 mins prior to appt time.

## 2021-12-18 ENCOUNTER — Telehealth (HOSPITAL_COMMUNITY): Payer: Self-pay

## 2021-12-18 ENCOUNTER — Encounter (HOSPITAL_COMMUNITY): Payer: Self-pay

## 2021-12-18 NOTE — Telephone Encounter (Signed)
Pt insurance is active and benefits verified through [facility]. Co-pay $0.00, DED $3,[phone]/$304.81 met, out of pocket $9,100.00/$622.04 met, co-insurance 40%. No pre-authorization required. Passport, 12/18/21 @ 11:02AM, YWV#[id number] ?  ?Will contact patient to see if she is interested in the Cardiac Rehab Program.  ?

## 2021-12-21 ENCOUNTER — Telehealth: Payer: Self-pay | Admitting: Student

## 2021-12-21 ENCOUNTER — Other Ambulatory Visit: Payer: Self-pay

## 2021-12-21 ENCOUNTER — Inpatient Hospital Stay: Payer: Managed Care, Other (non HMO) | Attending: Hematology and Oncology | Admitting: Hematology and Oncology

## 2021-12-21 ENCOUNTER — Inpatient Hospital Stay: Payer: Managed Care, Other (non HMO)

## 2021-12-21 VITALS — BP 162/76 | HR 71 | Temp 98.6°F | Resp 16 | Wt 118.6 lb

## 2021-12-21 DIAGNOSIS — Z885 Allergy status to narcotic agent status: Secondary | ICD-10-CM | POA: Insufficient documentation

## 2021-12-21 DIAGNOSIS — Z79899 Other long term (current) drug therapy: Secondary | ICD-10-CM | POA: Diagnosis not present

## 2021-12-21 DIAGNOSIS — D649 Anemia, unspecified: Secondary | ICD-10-CM | POA: Insufficient documentation

## 2021-12-21 DIAGNOSIS — Z886 Allergy status to analgesic agent status: Secondary | ICD-10-CM | POA: Insufficient documentation

## 2021-12-21 DIAGNOSIS — D500 Iron deficiency anemia secondary to blood loss (chronic): Secondary | ICD-10-CM

## 2021-12-21 DIAGNOSIS — D75839 Thrombocytosis, unspecified: Secondary | ICD-10-CM | POA: Diagnosis not present

## 2021-12-21 DIAGNOSIS — D72825 Bandemia: Secondary | ICD-10-CM

## 2021-12-21 DIAGNOSIS — Z808 Family history of malignant neoplasm of other organs or systems: Secondary | ICD-10-CM | POA: Diagnosis not present

## 2021-12-21 DIAGNOSIS — Z8249 Family history of ischemic heart disease and other diseases of the circulatory system: Secondary | ICD-10-CM | POA: Insufficient documentation

## 2021-12-21 DIAGNOSIS — R634 Abnormal weight loss: Secondary | ICD-10-CM | POA: Diagnosis not present

## 2021-12-21 DIAGNOSIS — Z880 Allergy status to penicillin: Secondary | ICD-10-CM | POA: Insufficient documentation

## 2021-12-21 DIAGNOSIS — I10 Essential (primary) hypertension: Secondary | ICD-10-CM | POA: Insufficient documentation

## 2021-12-21 DIAGNOSIS — F17210 Nicotine dependence, cigarettes, uncomplicated: Secondary | ICD-10-CM | POA: Diagnosis not present

## 2021-12-21 DIAGNOSIS — Z888 Allergy status to other drugs, medicaments and biological substances status: Secondary | ICD-10-CM | POA: Diagnosis not present

## 2021-12-21 DIAGNOSIS — Z881 Allergy status to other antibiotic agents status: Secondary | ICD-10-CM | POA: Diagnosis not present

## 2021-12-21 DIAGNOSIS — D72829 Elevated white blood cell count, unspecified: Secondary | ICD-10-CM | POA: Insufficient documentation

## 2021-12-21 DIAGNOSIS — Z7901 Long term (current) use of anticoagulants: Secondary | ICD-10-CM | POA: Diagnosis not present

## 2021-12-21 LAB — CMP (CANCER CENTER ONLY)
ALT: 7 U/L (ref 0–44)
AST: 11 U/L — ABNORMAL LOW (ref 15–41)
Albumin: 4.3 g/dL (ref 3.5–5.0)
Alkaline Phosphatase: 92 U/L (ref 38–126)
Anion gap: 6 (ref 5–15)
BUN: 10 mg/dL (ref 8–23)
CO2: 28 mmol/L (ref 22–32)
Calcium: 9.6 mg/dL (ref 8.9–10.3)
Chloride: 104 mmol/L (ref 98–111)
Creatinine: 0.81 mg/dL (ref 0.44–1.00)
GFR, Estimated: >60 mL/min (ref >60)
Glucose, Bld: 115 mg/dL — ABNORMAL HIGH (ref 70–99)
Potassium: 3.8 mmol/L (ref 3.5–5.1)
Sodium: 138 mmol/L (ref 135–145)
Total Bilirubin: 0.4 mg/dL (ref 0.3–1.2)
Total Protein: 7.3 g/dL (ref 6.5–8.1)

## 2021-12-21 LAB — C-REACTIVE PROTEIN: CRP: 1 mg/dL — ABNORMAL HIGH (ref <1.0)

## 2021-12-21 LAB — CBC WITH DIFFERENTIAL (CANCER CENTER ONLY)
Abs Immature Granulocytes: 0.04 10*3/uL (ref 0.00–0.07)
Basophils Absolute: 0.1 10*3/uL (ref 0.0–0.1)
Basophils Relative: 1 %
Eosinophils Absolute: 0.1 10*3/uL (ref 0.0–0.5)
Eosinophils Relative: 1 %
HCT: 42.6 % (ref 36.0–46.0)
Hemoglobin: 13.4 g/dL (ref 12.0–15.0)
Immature Granulocytes: 0 %
Lymphocytes Relative: 22 %
Lymphs Abs: 2.7 10*3/uL (ref 0.7–4.0)
MCH: 26.5 pg (ref 26.0–34.0)
MCHC: 31.5 g/dL (ref 30.0–36.0)
MCV: 84.4 fL (ref 80.0–100.0)
Monocytes Absolute: 0.8 10*3/uL (ref 0.1–1.0)
Monocytes Relative: 7 %
Neutro Abs: 8.4 10*3/uL — ABNORMAL HIGH (ref 1.7–7.7)
Neutrophils Relative %: 69 %
Platelet Count: 345 10*3/uL (ref 150–400)
RBC: 5.05 MIL/uL (ref 3.87–5.11)
RDW: 15.5 % (ref 11.5–15.5)
WBC Count: 12.2 10*3/uL — ABNORMAL HIGH (ref 4.0–10.5)
nRBC: 0 % (ref 0.0–0.2)

## 2021-12-21 LAB — SEDIMENTATION RATE: Sed Rate: 9 mm/hr (ref 0–22)

## 2021-12-21 LAB — IRON AND IRON BINDING CAPACITY (CC-WL,HP ONLY)
Iron: 41 ug/dL (ref 28–170)
Saturation Ratios: 12 % (ref 10.4–31.8)
TIBC: 346 ug/dL (ref 250–450)
UIBC: 305 ug/dL (ref 148–442)

## 2021-12-21 LAB — RETIC PANEL
Immature Retic Fract: 9.5 % (ref 2.3–15.9)
RBC.: 4.68 MIL/uL (ref 3.87–5.11)
Retic Count, Absolute: 43.5 10*3/uL (ref 19.0–186.0)
Retic Ct Pct: 0.9 % (ref 0.4–3.1)
Reticulocyte Hemoglobin: 33 pg (ref >27.9)

## 2021-12-21 LAB — FERRITIN: Ferritin: 31 ng/mL (ref 11–307)

## 2021-12-21 LAB — LACTATE DEHYDROGENASE: LDH: 138 U/L (ref 98–192)

## 2021-12-21 NOTE — Progress Notes (Signed)
[facility] [phone]   [phone]  INITIAL CONSULT NOTE  Patient Care Team: Julianne Handler, NP as PCP - General (Nurse Practitioner)  Hematological/Oncological History # Leukocytosis, Neutrophilic Predominance #Normocytic Anemia #Thrombocytosis 03/29/2020: WBC 40.6, Hgb 14.4, MCV 85.1, Plt 402 09/23/2021: WBC 24.3, Hgb 8.8, MCV 88.7, Plt 413 09/26/2021: WBC 21.7, Hgb 8.1, MCV 90.1, Plt 410 12/21/2021: establish care with Dr. Leonides Schanz   CHIEF COMPLAINTS/PURPOSE OF CONSULTATION:  "Leukocytosis, anemia, and thrombocytosis"  HISTORY OF PRESENTING ILLNESS:  [name] 66 y.o. female with medical history significant for asthma, hypertension, COPD, and irritable bowel syndrome who presents for evaluation of marked leukocytosis and anemia/thrombocytosis.  On review of the previous records [name] last had a normal CBC on 01/21/2015.  At that time she had a white blood cell count 9.9, hemoglobin 14.6, MCV of 93.8, and platelets of 206.  She developed a leukocytosis in at least 02/21/2020 at which time showed a white blood cell count of 16.2.  Subsequently on 03/22/2020 the patient was noted to have a white blood cell count of 29.5 with a neutrophil count of 25,000 and thrombocytosis with platelets of 525.  Most recently on 09/26/2021 the patient noted to have white blood cell count 21.7, hemoglobin 8.1, MCV of 90.1, and platelets of 410.  Due to concern for these findings the patient was referred to hematology for further evaluation and management.  On exam today [name] reports that she has had an elevated white blood cell count for "years".  She notes that she has been admitted to hospital previously with sepsis and "no one ever figured out why".  She notes that her most recent hospitalization was for being found down unresponsive and was thought to be due to a myocardial infarction.  She reports that she is not having any issues with bleeding or  bruising though she has had some provoked bruising on her Eliquis therapy.  She is not having any issues with dark stools and reports that she has not had any issues with iron deficiency in the past.  On further discussion she reports that her mother had pernicious anemia and her father had throat cancer and heart disease.  She has 2 healthy children.  She does that she is a former smoker having quit September 22, 2021.  She notes that she previously smoked 1 pack/day starting from the age of 66 years old.  She drinks alcohol about 4 times per year.  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  Her appetite has been poor and she has been losing weight.  She is currently down to 114.9 pounds down from 132 pounds when she was discharged from the hospital.  She notes that she does have some occasional issues of nausea and vomiting but denies any diarrhea.  She does not have any fevers, chills, sweats, lymphadenopathy, productive cough, or worsening shortness of breath.  A full 10 point ROS is listed below.  MEDICAL HISTORY:  Past Medical History:  Diagnosis Date   Asthma    Benign essential HTN    COPD (chronic obstructive pulmonary disease) (HCC)    IBS (irritable bowel syndrome)    Migraine headache     SURGICAL HISTORY: Past Surgical History:  Procedure Laterality Date   CORONARY/GRAFT ACUTE MI REVASCULARIZATION N/A 09/22/2021   Procedure: Coronary/Graft Acute MI Revascularization;  Surgeon: Yates Decamp, MD;  Location: [facility];  Service: Cardiovascular;  Laterality: N/A;   LEFT HEART CATH AND CORONARY ANGIOGRAPHY N/A 09/22/2021   Procedure: LEFT HEART CATH AND CORONARY ANGIOGRAPHY;  Surgeon: Yates Decamp, MD;  Location: [facility] CV [facility];  Service: Cardiovascular;  Laterality: N/A;   REVERSE SHOULDER ARTHROPLASTY Left 09/16/2021   Procedure: REVERSE SHOULDER ARTHROPLASTY;  Surgeon: Beverely Low, MD;  Location: [facility];  Service: Orthopedics;   Laterality: Left;    SOCIAL HISTORY: Social History   Socioeconomic History   Marital status: Married    Spouse name: Not on file   Number of children: Not on file   Years of education: Not on file   Highest education level: Not on file  Occupational History   Not on file  Tobacco Use   Smoking status: Heavy Smoker    Packs/day: 1.00    Types: Cigarettes   Smokeless tobacco: Never  Vaping Use   Vaping Use: Never used  Substance and Sexual Activity   Alcohol use: Yes    Alcohol/week: 0.0 standard drinks    Comment: Occasional to Rare   Drug use: No   Sexual activity: Not on file  Other Topics Concern   Not on file  Social History Narrative   Not on file   Social Determinants of Health   Financial Resource Strain: Not on file  Food Insecurity: Not on file  Transportation Needs: Not on file  Physical Activity: Not on file  Stress: Not on file  Social Connections: Not on file  Intimate Partner Violence: Not on file    FAMILY HISTORY: Family History  Problem Relation Age of Onset   Hypertension Mother    CAD Brother        Deceased on MI age 52   Cancer - Other Father        Oropharyngeal SCCa    ALLERGIES:  is allergic to cefepime, edoxaban, penicillins, barium iodide, other, acetaminophen, albuterol, aspirin, beta adrenergic blockers, diltiazem, lisinopril, oxycodone, prednisone, propofol, repatha [evolocumab], statins, sulfamethoxazole-trimethoprim, and lac bovis.  MEDICATIONS:  Current Outpatient Medications  Medication Sig Dispense Refill   nitroGLYCERIN (NITROSTAT) 0.4 MG SL tablet Place 0.4 mg under the tongue every 5 (five) minutes as needed for chest pain.     albuterol (PROVENTIL HFA;VENTOLIN HFA) 108 (90 BASE) MCG/ACT inhaler Inhale 2 puffs into the lungs 3 (three) times daily as needed for wheezing or shortness of breath.     apixaban (ELIQUIS) 5 MG TABS tablet Take 1 tablet (5 mg total) by mouth 2 (two) times daily. 180 tablet 3   atorvastatin  (LIPITOR) 80 MG tablet Take 1 tablet (80 mg total) by mouth daily. 90 tablet 3   clopidogrel (PLAVIX) 75 MG tablet Take 1 tablet (75 mg total) by mouth daily. 90 tablet 3   dapagliflozin propanediol (FARXIGA) 10 MG TABS tablet Take 1 tablet (10 mg total) by mouth daily. 90 tablet 3   diltiazem (CARDIZEM CD) 120 MG 24 hr capsule Take 1 capsule (120 mg total) by mouth daily. 90 capsule 3   fluticasone (FLOVENT HFA) 110 MCG/ACT inhaler Inhale 2 puffs into the lungs 2 (two) times daily. 1 each 12   furosemide (LASIX) 20 MG tablet Take 1 tablet (20 mg total) by mouth daily. 90 tablet 3   metoprolol tartrate (LOPRESSOR) 50 MG tablet Take 1 tablet (50 mg total) by mouth 2 (two) times daily. 180 tablet 3   pantoprazole (PROTONIX) 40 MG tablet Take 1 tablet (40 mg total) by mouth 2 (two) times daily. 60 tablet 0  promethazine (PHENERGAN) 25 MG suppository Place 25 mg rectally daily as needed for nausea.     promethazine (PHENERGAN) 25 MG tablet Take 1 tablet (25 mg total) by mouth every 6 (six) hours as needed for nausea or vomiting. 60 tablet 0   STIOLTO RESPIMAT 2.5-2.5 MCG/ACT AERS Inhale 2 puffs into the lungs daily.     traMADol-acetaminophen (ULTRACET) 37.5-325 MG tablet Take 2 tablets by mouth 2 (two) times daily.     vitamin B-12 (CYANOCOBALAMIN) 1000 MCG tablet Take 1,000 mcg by mouth daily.     Vitamin D, Ergocalciferol, (DRISDOL) 1.25 MG (50000 UNIT) CAPS capsule Take 50,000 Units by mouth once a week.     No current facility-administered medications for this visit.    REVIEW OF SYSTEMS:   Constitutional: ( - ) fevers, ( - )  chills , ( - ) night sweats Eyes: ( - ) blurriness of vision, ( - ) double vision, ( - ) watery eyes Ears, nose, mouth, throat, and face: ( - ) mucositis, ( - ) sore throat Respiratory: ( - ) cough, ( - ) dyspnea, ( - ) wheezes Cardiovascular: ( - ) palpitation, ( - ) chest discomfort, ( - ) lower extremity swelling Gastrointestinal:  ( - ) nausea, ( - ) heartburn, (  - ) change in bowel habits Skin: ( - ) abnormal skin rashes Lymphatics: ( - ) new lymphadenopathy, ( - ) easy bruising Neurological: ( - ) numbness, ( - ) tingling, ( - ) new weaknesses Behavioral/Psych: ( - ) mood change, ( - ) new changes  All other systems were reviewed with the patient and are negative.  PHYSICAL EXAMINATION:  Vitals:   12/21/21 0902  BP: (!) 162/76  Pulse: 71  Resp: 16  Temp: 98.6 F (37 C)  SpO2: 95%   Filed Weights   12/21/21 0902  Weight: 118 lb 9.6 oz (53.8 kg)    GENERAL: well appearing elderly Caucasian female in NAD  SKIN: skin color, texture, turgor are normal, no rashes or significant lesions EYES: conjunctiva are pink and non-injected, sclera clear LUNGS: clear to auscultation and percussion with normal breathing effort HEART: regular rate & rhythm and no murmurs and no lower extremity edema Musculoskeletal: no cyanosis of digits and no clubbing  PSYCH: alert & oriented x 3, fluent speech NEURO: no focal motor/sensory deficits  LABORATORY DATA:  I have reviewed the data as listed CBC Latest Ref Rng & Units 10/02/2021 09/30/2021 09/26/2021  WBC 4.0 - 10.5 K/uL 22.0(H) 22.0(H) 21.7(H)  Hemoglobin 12.0 - 15.0 g/dL 0.4(V) 4.0(J) 8.1(L)  Hematocrit 36.0 - 46.0 % 27.6(L) 27.8(L) 25.5(L)  Platelets 150 - 400 K/uL 464(H) 483(H) 410(H)    CMP Latest Ref Rng & Units 10/02/2021 09/30/2021 09/29/2021  Glucose 70 - 99 mg/dL 811(B) 147(W) 295(A)  BUN 8 - 23 mg/dL 13 23 21(H)  Creatinine 0.44 - 1.00 mg/dL 0.86 5.78 4.69  Sodium 135 - 145 mmol/L 134(L) 137 140  Potassium 3.5 - 5.1 mmol/L 4.1 3.2(L) 3.8  Chloride 98 - 111 mmol/L 99 97(L) 95(L)  CO2 22 - 32 mmol/L 28 32 38(H)  Calcium 8.9 - 10.3 mg/dL 8.1(L) 7.8(L) 8.7(L)  Total Protein 6.5 - 8.1 g/dL - - -  Total Bilirubin 0.3 - 1.2 mg/dL - - -  Alkaline Phos 38 - 126 U/L - - -  AST 15 - 41 U/L - - -  ALT 0 - 44 U/L - - -     ASSESSMENT & PLAN [name] 66 y.o. female with medical  history significant for asthma, hypertension, COPD, and irritable bowel syndrome who presents for evaluation of marked leukocytosis and anemia/thrombocytosis.    After review of the labs, review of the records, and discussion with the patient the patients findings are most consistent with leukocytosis, anemia, and thrombocytosis of unclear etiology.  At this time my greatest concern would be for myeloproliferative neoplasm.  We will order full panel to include JAK2 with reflex as well as BCR/ABL FISH.  Other possible etiologies would include iron deficiency anemia which would explain the normocytic anemia and thrombocytosis.  Additionally there could be an inflammatory condition causing the leukocytosis and elevated platelet count.  At this time we will order additional studies including iron panel, inflammatory markers, and the MPN work-up.  The patient voiced understanding of this plan moving forward.  # Leukocytosis, Neutrophilic Predominance #Normocytic Anemia #Thrombocytosis -- We will order MPN work-up with JAK2 with reflex as well as BCR/ABL FISH --Iron panel to include iron, TIBC, and ferritin --Inflammatory work-up with ESR/CRP --Return to clinic pending the results of the above studies.  We will plan for a placeholder appointment scheduled in 3 months time.  Orders Placed This Encounter  Procedures   CBC with Differential (Cancer Center Only)    Standing Status:   Future    Standing Expiration Date:   12/22/2022   CMP (Cancer Center only)    Standing Status:   Future    Standing Expiration Date:   12/22/2022   Iron and Iron Binding Capacity ([facility] only)    Standing Status:   Future    Standing Expiration Date:   12/22/2022   Retic Panel    Standing Status:   Future    Standing Expiration Date:   12/22/2022   Ferritin    Standing Status:   Future    Standing Expiration Date:   12/22/2022   Lactate dehydrogenase (LDH)    Standing Status:   Future    Standing Expiration Date:    12/21/2022   JAK2 (INCLUDING V617F AND EXON 12), MPL,& CALR W/RFL MPN PANEL (NGS)    Standing Status:   Future    Standing Expiration Date:   12/21/2022   BCR ABL1 FISH ([facility])    Standing Status:   Future    Standing Expiration Date:   12/22/2022   Sedimentation rate    Standing Status:   Future    Standing Expiration Date:   12/21/2022   C-reactive protein    Standing Status:   Future    Standing Expiration Date:   12/21/2022    All questions were answered. The patient knows to call the clinic with any problems, questions or concerns.  A total of more than 60 minutes were spent on this encounter with face-to-face time and non-face-to-face time, including preparing to see the patient, ordering tests and/or medications, counseling the patient and coordination of care as outlined above.   Ulysees Barns, MD Department of Hematology/Oncology [facility] Phone: [phone] Pager: [phone] Email: [email or web address]  12/21/2021 9:42 AM

## 2021-12-21 NOTE — Telephone Encounter (Signed)
Pt needs a call regarding cardiac rehab set up  ?

## 2021-12-22 ENCOUNTER — Telehealth: Payer: Self-pay | Admitting: Hematology and Oncology

## 2021-12-22 NOTE — Telephone Encounter (Signed)
Pt needs a referral

## 2021-12-22 NOTE — Telephone Encounter (Signed)
Scheduled per 3/6 los, pt has been called and confirmed  ?

## 2021-12-29 LAB — JAK2 (INCLUDING V617F AND EXON 12), MPL,& CALR W/RFL MPN PANEL (NGS)

## 2021-12-29 LAB — BCR ABL1 FISH (GENPATH)

## 2022-01-01 ENCOUNTER — Telehealth: Payer: Self-pay | Admitting: Physician Assistant

## 2022-01-01 MED ORDER — FERROUS SULFATE 325 (65 FE) MG PO TBEC: 325.0000 mg | DELAYED_RELEASE_TABLET | Freq: Every day | ORAL | 3 refills | Status: DC

## 2022-01-01 NOTE — Telephone Encounter (Signed)
I called Ms. Lewison to review the lab results from 12/21/2021. Findings show that anemia and thrombocytosis have resolved. Leukocytosis has improved from 22.0 to 12.2. Iron panel shows some deficiency so I sent a prescription for ferrous sulfate 325 mg once a day. MPN panel shows TP53 mutation. Due to improving blood counts, we will monitor levels and see patient back in clinic in 3 months. No indication for a bone marrow biopsy at this time.  ? ?Patient expressed understanding of the plan provided.  ?

## 2022-01-04 ENCOUNTER — Telehealth (HOSPITAL_COMMUNITY): Payer: Self-pay

## 2022-01-04 NOTE — Telephone Encounter (Signed)
No response from pt in regards to cardiac rehab. Closed referral 

## 2022-01-08 ENCOUNTER — Telehealth: Payer: Self-pay | Admitting: Pulmonary Disease

## 2022-01-08 NOTE — Telephone Encounter (Signed)
Patient's ONO results on room air show an SPO2 less than 88% for 13 minutes.  She does qualify for supplemental oxygen at night.  She is to continue using 2 L of supplemental oxygen.  Please add humidification to her oxygen orders if not already in place. ? ?Thanks, ?JD ?

## 2022-01-11 NOTE — Telephone Encounter (Signed)
Called patient but she did not answer. Left message for her to call back.  

## 2022-03-23 ENCOUNTER — Other Ambulatory Visit: Payer: Self-pay | Admitting: Hematology and Oncology

## 2022-03-23 ENCOUNTER — Other Ambulatory Visit: Payer: Self-pay

## 2022-03-23 ENCOUNTER — Inpatient Hospital Stay: Payer: Commercial Managed Care - HMO | Attending: Hematology and Oncology

## 2022-03-23 ENCOUNTER — Inpatient Hospital Stay (HOSPITAL_BASED_OUTPATIENT_CLINIC_OR_DEPARTMENT_OTHER): Payer: Commercial Managed Care - HMO | Admitting: Hematology and Oncology

## 2022-03-23 VITALS — BP 182/50 | HR 60 | Temp 97.9°F | Resp 19 | Ht 63.0 in | Wt 115.7 lb

## 2022-03-23 DIAGNOSIS — K589 Irritable bowel syndrome without diarrhea: Secondary | ICD-10-CM | POA: Insufficient documentation

## 2022-03-23 DIAGNOSIS — D649 Anemia, unspecified: Secondary | ICD-10-CM | POA: Insufficient documentation

## 2022-03-23 DIAGNOSIS — Z7901 Long term (current) use of anticoagulants: Secondary | ICD-10-CM | POA: Diagnosis not present

## 2022-03-23 DIAGNOSIS — D72825 Bandemia: Secondary | ICD-10-CM | POA: Diagnosis not present

## 2022-03-23 DIAGNOSIS — F17210 Nicotine dependence, cigarettes, uncomplicated: Secondary | ICD-10-CM | POA: Insufficient documentation

## 2022-03-23 DIAGNOSIS — Z809 Family history of malignant neoplasm, unspecified: Secondary | ICD-10-CM | POA: Insufficient documentation

## 2022-03-23 DIAGNOSIS — Z888 Allergy status to other drugs, medicaments and biological substances status: Secondary | ICD-10-CM | POA: Insufficient documentation

## 2022-03-23 DIAGNOSIS — Z880 Allergy status to penicillin: Secondary | ICD-10-CM | POA: Diagnosis not present

## 2022-03-23 DIAGNOSIS — Z885 Allergy status to narcotic agent status: Secondary | ICD-10-CM | POA: Insufficient documentation

## 2022-03-23 DIAGNOSIS — D500 Iron deficiency anemia secondary to blood loss (chronic): Secondary | ICD-10-CM

## 2022-03-23 DIAGNOSIS — Z886 Allergy status to analgesic agent status: Secondary | ICD-10-CM | POA: Diagnosis not present

## 2022-03-23 DIAGNOSIS — Z881 Allergy status to other antibiotic agents status: Secondary | ICD-10-CM | POA: Diagnosis not present

## 2022-03-23 DIAGNOSIS — D72829 Elevated white blood cell count, unspecified: Secondary | ICD-10-CM | POA: Diagnosis present

## 2022-03-23 DIAGNOSIS — I10 Essential (primary) hypertension: Secondary | ICD-10-CM | POA: Diagnosis not present

## 2022-03-23 DIAGNOSIS — Z8249 Family history of ischemic heart disease and other diseases of the circulatory system: Secondary | ICD-10-CM | POA: Diagnosis not present

## 2022-03-23 DIAGNOSIS — D75839 Thrombocytosis, unspecified: Secondary | ICD-10-CM | POA: Insufficient documentation

## 2022-03-23 DIAGNOSIS — Z79899 Other long term (current) drug therapy: Secondary | ICD-10-CM | POA: Insufficient documentation

## 2022-03-23 DIAGNOSIS — R509 Fever, unspecified: Secondary | ICD-10-CM | POA: Insufficient documentation

## 2022-03-23 LAB — CMP (CANCER CENTER ONLY)
ALT: 20 U/L (ref 0–44)
AST: 13 U/L — ABNORMAL LOW (ref 15–41)
Albumin: 4.2 g/dL (ref 3.5–5.0)
Alkaline Phosphatase: 83 U/L (ref 38–126)
Anion gap: 5 (ref 5–15)
BUN: 12 mg/dL (ref 8–23)
CO2: 30 mmol/L (ref 22–32)
Calcium: 9.6 mg/dL (ref 8.9–10.3)
Chloride: 103 mmol/L (ref 98–111)
Creatinine: 0.98 mg/dL (ref 0.44–1.00)
GFR, Estimated: >60 mL/min (ref >60)
Glucose, Bld: 120 mg/dL — ABNORMAL HIGH (ref 70–99)
Potassium: 4.4 mmol/L (ref 3.5–5.1)
Sodium: 138 mmol/L (ref 135–145)
Total Bilirubin: 0.4 mg/dL (ref 0.3–1.2)
Total Protein: 7 g/dL (ref 6.5–8.1)

## 2022-03-23 LAB — CBC WITH DIFFERENTIAL (CANCER CENTER ONLY)
Abs Immature Granulocytes: 0.04 10*3/uL (ref 0.00–0.07)
Basophils Absolute: 0.1 10*3/uL (ref 0.0–0.1)
Basophils Relative: 0 %
Eosinophils Absolute: 0.1 10*3/uL (ref 0.0–0.5)
Eosinophils Relative: 1 %
HCT: 39.3 % (ref 36.0–46.0)
Hemoglobin: 12.7 g/dL (ref 12.0–15.0)
Immature Granulocytes: 0 %
Lymphocytes Relative: 26 %
Lymphs Abs: 3.5 10*3/uL (ref 0.7–4.0)
MCH: 28.5 pg (ref 26.0–34.0)
MCHC: 32.3 g/dL (ref 30.0–36.0)
MCV: 88.1 fL (ref 80.0–100.0)
Monocytes Absolute: 1.1 10*3/uL — ABNORMAL HIGH (ref 0.1–1.0)
Monocytes Relative: 8 %
Neutro Abs: 9 10*3/uL — ABNORMAL HIGH (ref 1.7–7.7)
Neutrophils Relative %: 65 %
Platelet Count: 448 10*3/uL — ABNORMAL HIGH (ref 150–400)
RBC: 4.46 MIL/uL (ref 3.87–5.11)
RDW: 17.1 % — ABNORMAL HIGH (ref 11.5–15.5)
WBC Count: 13.8 10*3/uL — ABNORMAL HIGH (ref 4.0–10.5)
nRBC: 0 % (ref 0.0–0.2)

## 2022-03-23 NOTE — Progress Notes (Signed)
[facility] [phone]   [phone]  PROGRESS NOTE  Patient Care Team: Elenore Paddy, NP as PCP - General (Nurse Practitioner)  Hematological/Oncological History # Leukocytosis, Neutrophilic Predominance #Normocytic Anemia #Thrombocytosis 03/29/2020: WBC 40.6, Hgb 14.4, MCV 85.1, Plt 402 09/23/2021: WBC 24.3, Hgb 8.8, MCV 88.7, Plt 413 09/26/2021: WBC 21.7, Hgb 8.1, MCV 90.1, Plt 410 12/21/2021: establish care with Dr. Lorenso Courier   Interval History:  [name] 66 y.o. 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 The patient's last visit was on 12/21/2021 at which time she established care. In the interim since the last visit she has had no major changes in her health.  On exam today Mrs. Bothwell reports that she has been running low-grade temperatures.  She reports that her temperature jumped up to about 68 F which is "high for her".  She notes that she has these occasional bouts of "unexplained little fevers".  She notes that she continues to have some occasional pains in her abdomen and that "nothing is consistent".  She also continues to have pain in the spine.  Her weight has been steady and she is had no recent issues with weight loss.  She was taking iron pills as prescribed but found them difficult to tolerate on her stomach.  She is been taking over-the-counter supplement which has been easier to tolerate.  She otherwise denies any chills, sweats, nausea, ming, or diarrhea.  A full 10 point ROS was otherwise negative.  MEDICAL HISTORY:  Past Medical History:  Diagnosis Date   Asthma    Benign essential HTN    COPD (chronic obstructive pulmonary disease) (HCC)    IBS (irritable bowel syndrome)    Migraine headache     SURGICAL HISTORY: Past Surgical History:  Procedure Laterality Date   CORONARY/GRAFT ACUTE MI REVASCULARIZATION N/A 09/22/2021   Procedure:  Coronary/Graft Acute MI Revascularization;  Surgeon: Adrian Prows, MD;  Location: [facility];  Service: Cardiovascular;  Laterality: N/A;   LEFT HEART CATH AND CORONARY ANGIOGRAPHY N/A 09/22/2021   Procedure: LEFT HEART CATH AND CORONARY ANGIOGRAPHY;  Surgeon: Adrian Prows, MD;  Location: [facility];  Service: Cardiovascular;  Laterality: N/A;   REVERSE SHOULDER ARTHROPLASTY Left 09/16/2021   Procedure: REVERSE SHOULDER ARTHROPLASTY;  Surgeon: Netta Cedars, MD;  Location: [facility];  Service: Orthopedics;  Laterality: Left;    SOCIAL HISTORY: Social History   Socioeconomic History   Marital status: Married    Spouse name: Not on file   Number of children: Not on file   Years of education: Not on file   Highest education level: Not on file  Occupational History   Not on file  Tobacco Use   Smoking status: Heavy Smoker    Packs/day: 1.00    Types: Cigarettes   Smokeless tobacco: Never  Vaping Use   Vaping Use: Never used  Substance and Sexual Activity   Alcohol use: Yes    Alcohol/week: 0.0 standard drinks    Comment: Occasional to Rare   Drug use: No   Sexual activity: Not on file  Other Topics Concern   Not on file  Social History Narrative   Not on file   Social Determinants of Health   Financial Resource Strain: Not on file  Food Insecurity: Not on file  Transportation Needs: Not on file  Physical Activity: Not on file  Stress: Not on file  Social Connections: Not  on file  Intimate Partner Violence: Not on file    FAMILY HISTORY: Family History  Problem Relation Age of Onset   Hypertension Mother    CAD Brother        Deceased on MI age 63   Cancer - Other Father        Oropharyngeal SCCa    ALLERGIES:  is allergic to cefepime, edoxaban, penicillins, barium iodide, other, acetaminophen, albuterol, aspirin, beta adrenergic blockers, diltiazem, lisinopril, oxycodone, prednisone, propofol, repatha [evolocumab], statins, sulfamethoxazole-trimethoprim,  and lac bovis.  MEDICATIONS:  Current Outpatient Medications  Medication Sig Dispense Refill   albuterol (PROVENTIL HFA;VENTOLIN HFA) 108 (90 BASE) MCG/ACT inhaler Inhale 2 puffs into the lungs 3 (three) times daily as needed for wheezing or shortness of breath.     apixaban (ELIQUIS) 5 MG TABS tablet Take 1 tablet (5 mg total) by mouth 2 (two) times daily. 180 tablet 3   atorvastatin (LIPITOR) 80 MG tablet Take 1 tablet (80 mg total) by mouth daily. 90 tablet 3   clopidogrel (PLAVIX) 75 MG tablet Take 1 tablet (75 mg total) by mouth daily. 90 tablet 3   dapagliflozin propanediol (FARXIGA) 10 MG TABS tablet Take 1 tablet (10 mg total) by mouth daily. 90 tablet 3   diltiazem (CARDIZEM CD) 120 MG 24 hr capsule Take 1 capsule (120 mg total) by mouth daily. 90 capsule 3   ferrous sulfate 325 (65 FE) MG EC tablet Take 1 tablet (325 mg total) by mouth daily with breakfast. 30 tablet 3   fluticasone (FLOVENT HFA) 110 MCG/ACT inhaler Inhale 2 puffs into the lungs 2 (two) times daily. 1 each 12   furosemide (LASIX) 20 MG tablet Take 1 tablet (20 mg total) by mouth daily. 90 tablet 3   metoprolol tartrate (LOPRESSOR) 50 MG tablet Take 1 tablet (50 mg total) by mouth 2 (two) times daily. 180 tablet 3   nitroGLYCERIN (NITROSTAT) 0.4 MG SL tablet Place 0.4 mg under the tongue every 5 (five) minutes as needed for chest pain.     pantoprazole (PROTONIX) 40 MG tablet Take 1 tablet (40 mg total) by mouth 2 (two) times daily. 60 tablet 0   promethazine (PHENERGAN) 25 MG suppository Place 25 mg rectally daily as needed for nausea.     promethazine (PHENERGAN) 25 MG tablet Take 1 tablet (25 mg total) by mouth every 6 (six) hours as needed for nausea or vomiting. 60 tablet 0   STIOLTO RESPIMAT 2.5-2.5 MCG/ACT AERS Inhale 2 puffs into the lungs daily.     traMADol-acetaminophen (ULTRACET) 37.5-325 MG tablet Take 2 tablets by mouth 2 (two) times daily.     vitamin B-12 (CYANOCOBALAMIN) 1000 MCG tablet Take 1,000 mcg  by mouth daily.     Vitamin D, Ergocalciferol, (DRISDOL) 1.25 MG (50000 UNIT) CAPS capsule Take 50,000 Units by mouth once a week.     No current facility-administered medications for this visit.    REVIEW OF SYSTEMS:   Constitutional: ( - ) fevers, ( - )  chills , ( - ) night sweats Eyes: ( - ) blurriness of vision, ( - ) double vision, ( - ) watery eyes Ears, nose, mouth, throat, and face: ( - ) mucositis, ( - ) sore throat Respiratory: ( - ) cough, ( - ) dyspnea, ( - ) wheezes Cardiovascular: ( - ) palpitation, ( - ) chest discomfort, ( - ) lower extremity swelling Gastrointestinal:  ( - ) nausea, ( - ) heartburn, ( - ) change in bowel  habits Skin: ( - ) abnormal skin rashes Lymphatics: ( - ) new lymphadenopathy, ( - ) easy bruising Neurological: ( - ) numbness, ( - ) tingling, ( - ) new weaknesses Behavioral/Psych: ( - ) mood change, ( - ) new changes  All other systems were reviewed with the patient and are negative.  PHYSICAL EXAMINATION:  Vitals:   03/23/22 0918  BP: (!) 182/50  Pulse: 60  Resp: 19  Temp: 97.9 F (36.6 C)  SpO2: 99%   Filed Weights   03/23/22 0918  Weight: 115 lb 11.2 oz (52.5 kg)    GENERAL: alert, no distress and comfortable SKIN: skin color, texture, turgor are normal, no rashes or significant lesions EYES: conjunctiva are pink and non-injected, sclera clear LUNGS: clear to auscultation and percussion with normal breathing effort HEART: regular rate & rhythm and no murmurs and no lower extremity edema Musculoskeletal: no cyanosis of digits and no clubbing  PSYCH: alert & oriented x 3, fluent speech NEURO: no focal motor/sensory deficits  LABORATORY DATA:  I have reviewed the data as listed    Latest Ref Rng & Units 12/21/2021    9:48 AM 10/02/2021    4:49 AM 09/30/2021    2:10 PM  CBC  WBC 4.0 - 10.5 K/uL 12.2   22.0   22.0    Hemoglobin 12.0 - 15.0 g/dL 13.4   8.4   8.5    Hematocrit 36.0 - 46.0 % 42.6   27.6   27.8    Platelets 150 -  400 K/uL 345   464   483         Latest Ref Rng & Units 12/21/2021    9:48 AM 10/02/2021    4:49 AM 09/30/2021   11:29 PM  CMP  Glucose 70 - 99 mg/dL 115   116   108    BUN 8 - 23 mg/dL $Remove'10   13   23    'fYWCJnh$ Creatinine 0.44 - 1.00 mg/dL 0.81   0.91   0.90    Sodium 135 - 145 mmol/L 138   134   137    Potassium 3.5 - 5.1 mmol/L 3.8   4.1   3.2    Chloride 98 - 111 mmol/L 104   99   97    CO2 22 - 32 mmol/L 28   28   32    Calcium 8.9 - 10.3 mg/dL 9.6   8.1   7.8    Total Protein 6.5 - 8.1 g/dL 7.3      Total Bilirubin 0.3 - 1.2 mg/dL 0.4      Alkaline Phos 38 - 126 U/L 92      AST 15 - 41 U/L 11      ALT 0 - 44 U/L 7        RADIOGRAPHIC STUDIES: No results found.  ASSESSMENT & PLAN NECIA KAMM 66 y.o. 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   After review of the labs, review of the records, and discussion with the patient the patients findings are most consistent with leukocytosis 2/2 TP53 mutation noted on MPN panel.  Although in conjunction with other mutations and MPN's this can be indicative of a poor prognosis, the patient does not have any other genetic alterations.  She only has a mild leukocytosis of neutrophilic predominance.  Today we offered her continued observation versus bone marrow biopsy.  Given the length of  time this has been going on the patient would like to further evaluate with bone marrow biopsy.  We will have the scheduled promptly.  The patient voiced understanding of this plan moving forward.   # Leukocytosis, Neutrophilic Predominance #Normocytic Anemia--resolved  #Thrombocytosis -- Findings at this time are most consistent with leukocytosis secondary to a TP53 mutation noted on MPN panel --Leukocytosis is mild and there are currently no other abnormalities noted on CBC -- Offered the patient continued observation versus bone marrow biopsy to assess for possible underlying  myeloproliferative neoplasm.  The patient wished to pursue bone marrow biopsy.  We will have the scheduled promptly. --Labs today show white blood cell count 13.8, hemoglobin 12.7, MCV 88.1, and platelets of 448 --Return to clinic in 6 months time to reevaluate  No orders of the defined types were placed in this encounter.   All questions were answered. The patient knows to call the clinic with any problems, questions or concerns.  A total of more than 30 minutes were spent on this encounter with face-to-face time and non-face-to-face time, including preparing to see the patient, ordering tests and/or medications, counseling the patient and coordination of care as outlined above.   Ledell Peoples, MD Department of Hematology/Oncology [place] at [facility] Phone: [phone] Pager: [phone] Email: [email or web address]  03/23/2022 9:20 AM

## 2022-03-31 ENCOUNTER — Telehealth: Payer: Self-pay | Admitting: *Deleted

## 2022-03-31 NOTE — Telephone Encounter (Signed)
Received call from pt asking about bone marrow biopsy.  Advised that it has been authorized. Provided # for pt to call to get this scheduled.  Central radiology scheduling has tried to call her once already. Pt voiced understanding. 

## 2022-04-13 ENCOUNTER — Telehealth: Payer: Self-pay

## 2022-04-13 DIAGNOSIS — T148XXA Other injury of unspecified body region, initial encounter: Secondary | ICD-10-CM

## 2022-04-13 NOTE — Telephone Encounter (Signed)
Pt called and stated that she has been experiencing severe bruising for the last 2-3 weeks. She got bit by a bug and she scratched the area and it turned into a big bruise. She mentioned that she has bruising all over her body. Pt stated she does not want to change medications if she does not have to, but she in unsure of what she should do. Please advise.

## 2022-04-16 ENCOUNTER — Other Ambulatory Visit: Payer: Self-pay | Admitting: Student

## 2022-04-16 ENCOUNTER — Other Ambulatory Visit: Payer: Self-pay | Admitting: Radiology

## 2022-04-16 DIAGNOSIS — D72829 Elevated white blood cell count, unspecified: Secondary | ICD-10-CM

## 2022-04-16 NOTE — H&P (Signed)
Chief Complaint: Patient was seen in consultation today for thrombocytosis  Referring Physician(s): Dorsey,John T IV  Supervising Physician: Marliss Coots  Patient Status: [facility] - Out-pt  History of Present Illness: [name] is a 66 y.o. 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 [name] was noted to have leukocytosis of unknown etiology in 2021, this was monitored regularly and her WBC continued to increase. She was referred to hematology for further evaluation and was found to have TP53 mutation as the likely cause of unexplained leukocytosis. She was offered bone marrow biopsy to assess for possible underlying myeloproliferative neoplasm for which she presents today.  Past Medical History:  Diagnosis Date   Asthma    Benign essential HTN    COPD (chronic obstructive pulmonary disease) (HCC)    IBS (irritable bowel syndrome)    Migraine headache     Past Surgical History:  Procedure Laterality Date   CORONARY/GRAFT ACUTE MI REVASCULARIZATION N/A 09/22/2021   Procedure: Coronary/Graft Acute MI Revascularization;  Surgeon: Yates Decamp, MD;  Location: [facility];  Service: Cardiovascular;  Laterality: N/A;   LEFT HEART CATH AND CORONARY ANGIOGRAPHY N/A 09/22/2021   Procedure: LEFT HEART CATH AND CORONARY ANGIOGRAPHY;  Surgeon: Yates Decamp, MD;  Location: [facility];  Service: Cardiovascular;  Laterality: N/A;   REVERSE SHOULDER ARTHROPLASTY Left 09/16/2021   Procedure: REVERSE SHOULDER ARTHROPLASTY;  Surgeon: Beverely Low, MD;  Location: [facility];  Service: Orthopedics;  Laterality: Left;    Allergies: Cefepime, Edoxaban, Penicillins, Barium iodide, Other, Acetaminophen, Albuterol, Aspirin, Beta adrenergic blockers, Diltiazem, Lisinopril, Oxycodone, Prednisone, Propofol, Repatha [evolocumab], Statins, Sulfamethoxazole-trimethoprim,  and Lac bovis  Medications: Prior to Admission medications   Medication Sig Start Date End Date Taking? Authorizing Provider  albuterol (PROVENTIL HFA;VENTOLIN HFA) 108 (90 BASE) MCG/ACT inhaler Inhale 2 puffs into the lungs 3 (three) times daily as needed for wheezing or shortness of breath.    [provider]  apixaban (ELIQUIS) 5 MG TABS tablet Take 1 tablet (5 mg total) by mouth 2 (two) times daily. 11/27/21   Cantwell, Celeste C, PA-C  atorvastatin (LIPITOR) 80 MG tablet Take 1 tablet (80 mg total) by mouth daily. 10/14/21   Cantwell, Celeste C, PA-C  Budeson-Glycopyrrol-Formoterol (BREZTRI AEROSPHERE) 160-9-4.8 MCG/ACT AERO Inhale into the lungs.    [provider]  clopidogrel (PLAVIX) 75 MG tablet Take 1 tablet (75 mg total) by mouth daily. 10/14/21   Cantwell, Celeste C, PA-C  dapagliflozin propanediol (FARXIGA) 10 MG TABS tablet Take 1 tablet (10 mg total) by mouth daily. 10/14/21   Cantwell, Celeste C, PA-C  diltiazem (CARDIZEM CD) 120 MG 24 hr capsule Take 1 capsule (120 mg total) by mouth daily. 10/14/21   Cantwell, Celeste C, PA-C  ferrous sulfate 325 (65 FE) MG EC tablet Take 1 tablet (325 mg total) by mouth daily with breakfast. Patient not taking: Reported on 03/23/2022 01/01/22   Georga Kaufmann T, PA-C  fluticasone (FLOVENT HFA) 110 MCG/ACT inhaler Inhale 2 puffs into the lungs 2 (two) times daily. 11/20/21   Martina Sinner, MD  furosemide (LASIX) 20 MG tablet Take 1 tablet (20 mg total) by mouth daily. 10/14/21   Cantwell, Celeste C, PA-C  metoprolol tartrate (LOPRESSOR) 50 MG tablet Take 1 tablet (50 mg total) by mouth 2 (two) times daily. 10/14/21   Cantwell, Celeste C, PA-C  nitroGLYCERIN (NITROSTAT) 0.4 MG SL tablet  Place 0.4 mg under the tongue every 5 (five) minutes as needed for chest pain.    [provider]  pantoprazole (PROTONIX) 40 MG tablet Take 1 tablet (40 mg total) by mouth 2 (two) times daily. 10/02/21   Shelly Coss, MD  potassium  chloride (KLOR-CON) 10 MEQ tablet Take 20 mEq by mouth daily.    [provider]  pregabalin (LYRICA) 25 MG capsule Take 25 mg by mouth 2 (two) times daily.    [provider]  promethazine (PHENERGAN) 25 MG suppository Place 25 mg rectally daily as needed for nausea. 09/14/21   [provider]  promethazine (PHENERGAN) 25 MG tablet Take 1 tablet (25 mg total) by mouth every 6 (six) hours as needed for nausea or vomiting. 09/17/21   Netta Cedars, MD  vitamin B-12 (CYANOCOBALAMIN) 1000 MCG tablet Take 1,000 mcg by mouth daily.    [provider]  Vitamin D, Ergocalciferol, (DRISDOL) 1.25 MG (50000 UNIT) CAPS capsule Take 50,000 Units by mouth once a week. 08/31/21   [provider]     Family History  Problem Relation Age of Onset   Hypertension Mother    CAD Brother        Deceased on MI age 63   Cancer - Other Father        Oropharyngeal SCCa    Social History   Socioeconomic History   Marital status: Married    Spouse name: Not on file   Number of children: Not on file   Years of education: Not on file   Highest education level: Not on file  Occupational History   Not on file  Tobacco Use   Smoking status: Heavy Smoker    Packs/day: 1.00    Types: Cigarettes   Smokeless tobacco: Never  Vaping Use   Vaping Use: Never used  Substance and Sexual Activity   Alcohol use: Yes    Alcohol/week: 0.0 standard drinks of alcohol    Comment: Occasional to Rare   Drug use: No   Sexual activity: Not on file  Other Topics Concern   Not on file  Social History Narrative   Not on file   Social Determinants of Health   Financial Resource Strain: Not on file  Food Insecurity: Not on file  Transportation Needs: Not on file  Physical Activity: Not on file  Stress: Not on file  Social Connections: Not on file     Review of Systems: A 12 point ROS discussed and pertinent positives are indicated in the HPI above.  All other systems are  negative.  Review of Systems  Vital Signs: There were no vitals taken for this visit.  Physical Exam       Imaging: No results found.  Labs:  CBC: Recent Labs    09/30/21 1410 10/02/21 0449 12/21/21 0948 03/23/22 0905  WBC 22.0* 22.0* 12.2* 13.8*  HGB 8.5* 8.4* 13.4 12.7  HCT 27.8* 27.6* 42.6 39.3  PLT 483* 464* 345 448*    COAGS: No results for input(s): "INR", "APTT" in the last 8760 hours.  BMP: Recent Labs    09/30/21 2329 10/02/21 0449 12/21/21 0948 03/23/22 0905  NA 137 134* 138 138  K 3.2* 4.1 3.8 4.4  CL 97* 99 104 103  CO2 32 $Remo'28 28 30  'aGrWY$ GLUCOSE 108* 116* 115* 120*  BUN $Re'23 13 10 12  'wFl$ CALCIUM 7.8* 8.1* 9.6 9.6  CREATININE 0.90 0.91 0.81 0.98  GFRNONAA >60 >60 >60 >60    LIVER  FUNCTION TESTS: Recent Labs    09/24/21 0221 12/21/21 0948 03/23/22 0905  BILITOT 0.6 0.4 0.4  AST 53* 11* 13*  ALT 37 7 20  ALKPHOS 87 92 83  PROT 6.0* 7.3 7.0  ALBUMIN 2.7* 4.3 4.2    TUMOR MARKERS: No results for input(s): "AFPTM", "CEA", "CA199", "CHROMGRNA" in the last 8760 hours.  Assessment and Plan:  66 y/o F with history of thrombocytosis, leukocytosis likely secondary to TP53 mutation who presents today for a bone marrow biopsy to assess for underlying myeloproliferative neoplasm.  Risks and benefits of bone marrow biopsy was discussed with the patient and/or patient's family including, but not limited to bleeding, infection, damage to adjacent structures or low yield requiring additional tests.  All of the questions were answered and there is agreement to proceed.  Consent signed and in chart.  Thank you for this interesting consult.  I greatly enjoyed meeting JOSEFINA RYNDERS and look forward to participating in their care.  A copy of this report was sent to the requesting provider on this date.  Electronically Signed: Joaquim Nam, PA-C 04/16/2022, 10:22 AM   I spent a total of 30 Minutes  \in face to face in clinical consultation,  greater than 50% of which was counseling/coordinating care for bone marrow biopsy.

## 2022-04-17 LAB — HEMOGLOBIN AND HEMATOCRIT, BLOOD
Hematocrit: 36.9 % (ref 34.0–46.6)
Hemoglobin: 12.3 g/dL (ref 11.1–15.9)

## 2022-04-19 ENCOUNTER — Ambulatory Visit (HOSPITAL_COMMUNITY)
Admission: RE | Admit: 2022-04-19 | Discharge: 2022-04-19 | Disposition: A | Discharge status: Home / Self Care | Payer: Commercial Managed Care - HMO | Source: Ambulatory Visit | Attending: Hematology and Oncology | Admitting: Hematology and Oncology

## 2022-04-19 ENCOUNTER — Encounter (HOSPITAL_COMMUNITY): Payer: Self-pay

## 2022-04-19 DIAGNOSIS — K588 Other irritable bowel syndrome: Secondary | ICD-10-CM | POA: Insufficient documentation

## 2022-04-19 DIAGNOSIS — D72825 Bandemia: Secondary | ICD-10-CM | POA: Insufficient documentation

## 2022-04-19 DIAGNOSIS — D72829 Elevated white blood cell count, unspecified: Secondary | ICD-10-CM | POA: Diagnosis not present

## 2022-04-19 DIAGNOSIS — J449 Chronic obstructive pulmonary disease, unspecified: Secondary | ICD-10-CM | POA: Diagnosis not present

## 2022-04-19 DIAGNOSIS — I10 Essential (primary) hypertension: Secondary | ICD-10-CM | POA: Diagnosis not present

## 2022-04-19 DIAGNOSIS — D720 Genetic anomalies of leukocytes: Secondary | ICD-10-CM | POA: Diagnosis not present

## 2022-04-19 DIAGNOSIS — D696 Thrombocytopenia, unspecified: Secondary | ICD-10-CM | POA: Insufficient documentation

## 2022-04-19 LAB — CBC WITH DIFFERENTIAL/PLATELET
Abs Immature Granulocytes: 0.04 10*3/uL (ref 0.00–0.07)
Basophils Absolute: 0.1 10*3/uL (ref 0.0–0.1)
Basophils Relative: 0 %
Eosinophils Absolute: 0.1 10*3/uL (ref 0.0–0.5)
Eosinophils Relative: 0 %
HCT: 38.1 % (ref 36.0–46.0)
Hemoglobin: 12.3 g/dL (ref 12.0–15.0)
Immature Granulocytes: 0 %
Lymphocytes Relative: 23 %
Lymphs Abs: 2.7 10*3/uL (ref 0.7–4.0)
MCH: 29.4 pg (ref 26.0–34.0)
MCHC: 32.3 g/dL (ref 30.0–36.0)
MCV: 91.1 fL (ref 80.0–100.0)
Monocytes Absolute: 0.9 10*3/uL (ref 0.1–1.0)
Monocytes Relative: 8 %
Neutro Abs: 7.9 10*3/uL — ABNORMAL HIGH (ref 1.7–7.7)
Neutrophils Relative %: 69 %
Platelets: 285 10*3/uL (ref 150–400)
RBC: 4.18 MIL/uL (ref 3.87–5.11)
RDW: 17.1 % — ABNORMAL HIGH (ref 11.5–15.5)
WBC: 11.6 10*3/uL — ABNORMAL HIGH (ref 4.0–10.5)
nRBC: 0 % (ref 0.0–0.2)

## 2022-04-19 MED ORDER — LIDOCAINE HCL 1 % IJ SOLN
INTRAMUSCULAR | Status: AC | PRN
  Administered 2022-04-19: 10 mL via INTRADERMAL

## 2022-04-19 MED ORDER — MIDAZOLAM HCL 2 MG/2ML IJ SOLN
INTRAMUSCULAR | Status: AC
  Filled 2022-04-19: qty 2

## 2022-04-19 MED ORDER — LIDOCAINE-EPINEPHRINE 1 %-1:100000 IJ SOLN
INTRAMUSCULAR | Status: AC | PRN
  Administered 2022-04-19: 10 mL via INTRADERMAL

## 2022-04-19 MED ORDER — MIDAZOLAM HCL 2 MG/2ML IJ SOLN
INTRAMUSCULAR | Status: AC | PRN
  Administered 2022-04-19: 1 mg via INTRAVENOUS

## 2022-04-19 MED ORDER — FENTANYL CITRATE (PF) 100 MCG/2ML IJ SOLN
INTRAMUSCULAR | Status: AC
  Filled 2022-04-19: qty 2

## 2022-04-19 MED ORDER — FLUMAZENIL 0.5 MG/5ML IV SOLN
INTRAVENOUS | Status: AC
  Filled 2022-04-19: qty 5

## 2022-04-19 MED ORDER — SODIUM CHLORIDE 0.9 % IV SOLN: INTRAVENOUS | Status: DC

## 2022-04-19 MED ORDER — FENTANYL CITRATE (PF) 100 MCG/2ML IJ SOLN
INTRAMUSCULAR | Status: AC | PRN
  Administered 2022-04-19: 50 ug via INTRAVENOUS

## 2022-04-19 NOTE — Procedures (Signed)
Interventional Radiology Procedure Note  Procedure: CT guided aspirate and core biopsy of right iliac bone  Complications: None  Recommendations: - Bedrest supine x 1 hrs - Hydrocodone PRN  Pain - Follow biopsy results   Raini Tiley, MD   

## 2022-04-29 ENCOUNTER — Telehealth: Payer: Self-pay | Admitting: *Deleted

## 2022-04-29 ENCOUNTER — Encounter (HOSPITAL_COMMUNITY): Payer: Self-pay | Admitting: Hematology and Oncology

## 2022-04-29 NOTE — Telephone Encounter (Signed)
Communicated the lab results and Dr Derek Mound interpretation to the patient over the phone.  She denies having any additional questions at this time.

## 2022-04-29 NOTE — Telephone Encounter (Signed)
-----  Message from Otila Kluver, RN sent at 04/29/2022  2:00 PM EDT -----  ----- Message ----- From: Orson Slick, MD Sent: 04/25/2022   8:00 PM EDT To: Otila Kluver, RN  Please let [name] know that her bone marrow biopsy did not show any clear evidence of abnormalities.  There was no evidence of a bone marrow disorder.  The most likely cause of her high white blood cell count would be her TP53 mutation.  I would recommend follow-up in 6 months time in order to reevaluate and assure that her blood counts are stable.  We currently have a clinic visit scheduled for December 2023. ----- Message ----- From: Interface, Lab In Three Zero One Sent: 04/21/2022  12:47 PM EDT To: Orson Slick, MD

## 2022-04-30 ENCOUNTER — Telehealth: Payer: Self-pay | Admitting: *Deleted

## 2022-04-30 NOTE — Telephone Encounter (Signed)
TCT patient regarding recent bone marrow biopsy results. Per Dr. Lorenso Courier: "Her bone marrow biopsy did not show any clear evidence of abnormalities.  There was no evidence of a bone marrow disorder.  The most likely cause of her high white blood cell count would be her TP53 mutation.  I would recommend follow-up in 6 months time in order to reevaluate and assure that her blood counts are stable.  We currently have a clinic visit scheduled for December 2023"  No answer but was able to leave vm message for pt to return call at her convenience @ [phone]

## 2022-04-30 NOTE — Telephone Encounter (Signed)
-----  Message from Orson Slick, MD sent at 04/25/2022  8:00 PM EDT ----- Please let Mrs. Moquin know that her bone marrow biopsy did not show any clear evidence of abnormalities.  There was no evidence of a bone marrow disorder.  The most likely cause of her high white blood cell count would be her TP53 mutation.  I would recommend follow-up in 6 months time in order to reevaluate and assure that her blood counts are stable.  We currently have a clinic visit scheduled for December 2023. ----- Message ----- From: Interface, Lab In Three Zero One Sent: 04/21/2022  12:47 PM EDT To: Orson Slick, MD

## 2022-05-04 LAB — SURGICAL PATHOLOGY

## 2022-05-06 ENCOUNTER — Encounter: Payer: Self-pay | Admitting: Student

## 2022-05-06 ENCOUNTER — Ambulatory Visit: Payer: Commercial Managed Care - HMO | Admitting: Student

## 2022-05-06 VITALS — BP 142/88 | HR 69 | Temp 98.8°F | Resp 16 | Ht 63.0 in | Wt 117.6 lb

## 2022-05-06 DIAGNOSIS — R0989 Other specified symptoms and signs involving the circulatory and respiratory systems: Secondary | ICD-10-CM

## 2022-05-06 DIAGNOSIS — I10 Essential (primary) hypertension: Secondary | ICD-10-CM

## 2022-05-06 DIAGNOSIS — I25110 Atherosclerotic heart disease of native coronary artery with unstable angina pectoris: Secondary | ICD-10-CM

## 2022-05-06 NOTE — Progress Notes (Signed)
Primary Physician/Referring:  Julianne Handler, NP  Patient ID: [name], female    DOB: 11-Nov-1955, 66 y.o.   MRN: [id number]  Chief Complaint  Patient presents with   Coronary Artery Disease   Hypertension   Follow-up   HPI:    [name]  is a 66 y.o. 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   Patient required admission to the ICU following cardiac catheterization with acute COPD exacerbation and heart failure exacerbation requiring ventilator support. Patient developed atrial fibrillation with RVR and subsequently converted to normal sinus rhythm.  Echocardiogram during admission showed moderate to severe MR with concern of papillary muscle dysfunction secondary to ischemia.    Patient was last seen in the office 11/23/2021 at which time echocardiogram revealed preserved LVEF and no significant valvular abnormality, grade 1 diastolic dysfunction.  Patient was otherwise stable from a cardiovascular standpoint at last visit no changes were made.  She now presents for 51-month follow-up.  Patient states she is feeling well overall without specific complaints or concerns today.  She continues to have chronic shoulder pain.  She monitors her blood pressure daily at home with average readings reported to be 110/70 mmHg.  She does note albuterol inhaler use this morning which she has noticed increases her blood pressure at home as well.  Denies chest pain, significant dyspnea, orthopnea, PND, leg edema.  Past Medical History:  Diagnosis Date   Asthma    Benign essential HTN    COPD (chronic obstructive pulmonary disease) (HCC)    IBS (irritable bowel syndrome)    Migraine headache    Past Surgical History:  Procedure Laterality Date   CORONARY/GRAFT ACUTE MI  REVASCULARIZATION N/A 09/22/2021   Procedure: Coronary/Graft Acute MI Revascularization;  Surgeon: Yates Decamp, MD;  Location: [facility];  Service: Cardiovascular;  Laterality: N/A;   LEFT HEART CATH AND CORONARY ANGIOGRAPHY N/A 09/22/2021   Procedure: LEFT HEART CATH AND CORONARY ANGIOGRAPHY;  Surgeon: Yates Decamp, MD;  Location: [facility];  Service: Cardiovascular;  Laterality: N/A;   REVERSE SHOULDER ARTHROPLASTY Left 09/16/2021   Procedure: REVERSE SHOULDER ARTHROPLASTY;  Surgeon: Beverely Low, MD;  Location: [facility];  Service: Orthopedics;  Laterality: Left;   Family History  Problem Relation Age of Onset   Hypertension Mother    CAD Brother        Deceased on MI age 57   Cancer - Other Father        Oropharyngeal SCCa    Social History   Tobacco Use   Smoking status: Some Days    Packs/day: 1.00    Types: Cigarettes   Smokeless tobacco: Never  Substance Use Topics   Alcohol use: Yes    Alcohol/week: 0.0 standard drinks of alcohol    Comment: Occasional to Rare   Marital Status: Married   ROS  Review of Systems  Constitutional: Negative for malaise/fatigue.  Cardiovascular:  Negative for chest pain, claudication, dyspnea on exertion, leg swelling, near-syncope, orthopnea, palpitations, paroxysmal nocturnal dyspnea and syncope.  Respiratory:  Negative for wheezing.   Neurological:  Negative for dizziness.    Objective  Blood pressure (!) 142/88, pulse 69, temperature 98.8 F (37.1 C), temperature source Temporal, resp. rate 16, height 5\' 3"  (1.6 m), weight 117 lb 9.6 oz (53.3 kg), SpO2  94 %.     05/06/2022    9:20 AM 04/19/2022   10:15 AM 04/19/2022   10:00 AM  Vitals with BMI  Height 5\' 3"     Weight 117 lbs 10 oz    BMI [id number]    Systolic [id number]  Diastolic 88 68 54  Pulse 69 73 72      Physical Exam Vitals reviewed.  Cardiovascular:     Rate and Rhythm: Normal rate and regular rhythm.     Pulses: Intact distal pulses.          Carotid  pulses are  on the right side with bruit and  on the left side with bruit.      Popliteal pulses are 0 on the right side and 0 on the left side.       Dorsalis pedis pulses are 0 on the right side and 0 on the left side.       Posterior tibial pulses are 0 on the right side and 0 on the left side.     Heart sounds: S1 normal and S2 normal. Heart sounds are distant. No murmur heard.    No gallop.  Pulmonary:     Effort: Pulmonary effort is normal. No respiratory distress.     Breath sounds: No wheezing, rhonchi or rales.  Musculoskeletal:     Right lower leg: No edema.     Left lower leg: No edema.  Neurological:     Mental Status: She is alert.    Laboratory examination:   Recent Labs    10/02/21 0449 12/21/21 0948 03/23/22 0905  NA 134* 138 138  K 4.1 3.8 4.4  CL 99 104 103  CO2 28 28 30   GLUCOSE 116* 115* 120*  BUN 13 10 12   CREATININE 0.91 0.81 0.98  CALCIUM 8.1* 9.6 9.6  GFRNONAA >60 >60 >60   CrCl cannot be calculated (Patient's most recent lab result is older than the maximum 21 days allowed.).     Latest Ref Rng & Units 03/23/2022    9:05 AM 12/21/2021    9:48 AM 10/02/2021    4:49 AM  CMP  Glucose 70 - 99 mg/dL 120  115  116   BUN 8 - 23 mg/dL 12  10  13    Creatinine 0.44 - 1.00 mg/dL 0.98  0.81  0.91   Sodium 135 - 145 mmol/L 138  138  134   Potassium 3.5 - 5.1 mmol/L 4.4  3.8  4.1   Chloride 98 - 111 mmol/L 103  104  99   CO2 22 - 32 mmol/L 30  28  28    Calcium 8.9 - 10.3 mg/dL 9.6  9.6  8.1   Total Protein 6.5 - 8.1 g/dL 7.0  7.3    Total Bilirubin 0.3 - 1.2 mg/dL 0.4  0.4    Alkaline Phos 38 - 126 U/L 83  92    AST 15 - 41 U/L 13  11    ALT 0 - 44 U/L 20  7        Latest Ref Rng & Units 04/19/2022    7:30 AM 04/16/2022    2:11 PM 03/23/2022    9:05 AM  CBC  WBC 4.0 - 10.5 K/uL 11.6   13.8   Hemoglobin 12.0 - 15.0 g/dL 12.3  12.3  12.7   Hematocrit 36.0 - 46.0 % 38.1  36.9  39.3   Platelets 150 - 400 K/uL 285   448  Lipid Panel Recent Labs     09/24/21 0221  CHOL 161  TRIG 121  LDLCALC 110*  VLDL 24  HDL 27*  CHOLHDL 6.0    HEMOGLOBIN A1C Lab Results  Component Value Date   HGBA1C 6.7 (H) 09/17/2021   MPG 146 09/17/2021   TSH Recent Labs    09/24/21 0221  TSH 0.177*   External labs:  None   Allergies   Allergies  Allergen Reactions   Cefepime Anaphylaxis, Shortness Of Breath, Swelling and Dermatitis   Edoxaban Other (See Comments)    Causes hematomas   Penicillins Anaphylaxis   Barium Iodide Rash   Other     Other reaction(s): Other Preservatives  (C-Diff) Other reaction(s): Unknown Preservatives  (C-Diff) Other reaction(s): OTHER   Acetaminophen     Will not take after Cyanide deaths, something changed when they mkt'd again.  Can take Childrens Liquid but sometimes upset stomach too   Albuterol Hypertension    ABLE TO TOLERATE IN INHALER FORM NOT NEBULIZER    Aspirin     Makes anemic   Beta Adrenergic Blockers     Paradoxical reactions   Diltiazem Other (See Comments)    Patient is intolerant to all CALCIUM CHANNEL BLOCKERS, causes her blood pressure to rise.    Lisinopril     Can only take her own mfr.     Oxycodone    Prednisone     REACTION: ibs probs   Propofol     REACTION: FEVER   Repatha [Evolocumab] Other (See Comments)    Knocked her out for 10 days   Statins     GI issues   Sulfamethoxazole-Trimethoprim     REACTION: antibiotic induced colitis   Milk (Cow) Other (See Comments)    Medications Prior to Visit:   Outpatient Medications Prior to Visit  Medication Sig Dispense Refill   albuterol (PROVENTIL HFA;VENTOLIN HFA) 108 (90 BASE) MCG/ACT inhaler Inhale 2 puffs into the lungs 3 (three) times daily as needed for wheezing or shortness of breath.     apixaban (ELIQUIS) 5 MG TABS tablet Take 1 tablet (5 mg total) by mouth 2 (two) times daily. 180 tablet 3   Budeson-Glycopyrrol-Formoterol (BREZTRI AEROSPHERE) 160-9-4.8 MCG/ACT AERO Inhale into the lungs.     clopidogrel  (PLAVIX) 75 MG tablet Take 1 tablet (75 mg total) by mouth daily. 90 tablet 3   Cyanocobalamin 2000 MCG/ML SOLN Inject as directed.     dapagliflozin propanediol (FARXIGA) 10 MG TABS tablet Take 1 tablet (10 mg total) by mouth daily. 90 tablet 3   diltiazem (CARDIZEM CD) 120 MG 24 hr capsule Take 1 capsule (120 mg total) by mouth daily. 90 capsule 3   furosemide (LASIX) 20 MG tablet Take 1 tablet (20 mg total) by mouth daily. (Patient taking differently: Take 20 mg by mouth as needed.) 90 tablet 3   metoprolol tartrate (LOPRESSOR) 50 MG tablet Take 1 tablet (50 mg total) by mouth 2 (two) times daily. 180 tablet 3   nitroGLYCERIN (NITROSTAT) 0.4 MG SL tablet Place 0.4 mg under the tongue every 5 (five) minutes as needed for chest pain.     pantoprazole (PROTONIX) 40 MG tablet Take 1 tablet (40 mg total) by mouth 2 (two) times daily. 60 tablet 0   potassium chloride (KLOR-CON) 10 MEQ tablet Take 20 mEq by mouth daily.     pregabalin (LYRICA) 25 MG capsule Take 25 mg by mouth 2 (two) times daily.     promethazine (PHENERGAN) 25 MG  suppository Place 25 mg rectally daily as needed for nausea.     Vitamin D, Ergocalciferol, (DRISDOL) 1.25 MG (50000 UNIT) CAPS capsule Take 50,000 Units by mouth once a week.     atorvastatin (LIPITOR) 80 MG tablet Take 80 mg by mouth daily.     atorvastatin (LIPITOR) 80 MG tablet Take 1 tablet (80 mg total) by mouth daily. 90 tablet 3   ferrous sulfate 325 (65 FE) MG EC tablet Take 1 tablet (325 mg total) by mouth daily with breakfast. (Patient not taking: Reported on 03/23/2022) 30 tablet 3   fluticasone (FLOVENT HFA) 110 MCG/ACT inhaler Inhale 2 puffs into the lungs 2 (two) times daily. 1 each 12   promethazine (PHENERGAN) 25 MG tablet Take 1 tablet (25 mg total) by mouth every 6 (six) hours as needed for nausea or vomiting. 60 tablet 0   vitamin B-12 (CYANOCOBALAMIN) 1000 MCG tablet Take 1,000 mcg by mouth daily.     No facility-administered medications prior to visit.    Final Medications at End of Visit    Current Meds  Medication Sig   albuterol (PROVENTIL HFA;VENTOLIN HFA) 108 (90 BASE) MCG/ACT inhaler Inhale 2 puffs into the lungs 3 (three) times daily as needed for wheezing or shortness of breath.   apixaban (ELIQUIS) 5 MG TABS tablet Take 1 tablet (5 mg total) by mouth 2 (two) times daily.   Budeson-Glycopyrrol-Formoterol (BREZTRI AEROSPHERE) 160-9-4.8 MCG/ACT AERO Inhale into the lungs.   clopidogrel (PLAVIX) 75 MG tablet Take 1 tablet (75 mg total) by mouth daily.   Cyanocobalamin 2000 MCG/ML SOLN Inject as directed.   dapagliflozin propanediol (FARXIGA) 10 MG TABS tablet Take 1 tablet (10 mg total) by mouth daily.   diltiazem (CARDIZEM CD) 120 MG 24 hr capsule Take 1 capsule (120 mg total) by mouth daily.   furosemide (LASIX) 20 MG tablet Take 1 tablet (20 mg total) by mouth daily. (Patient taking differently: Take 20 mg by mouth as needed.)   metoprolol tartrate (LOPRESSOR) 50 MG tablet Take 1 tablet (50 mg total) by mouth 2 (two) times daily.   nitroGLYCERIN (NITROSTAT) 0.4 MG SL tablet Place 0.4 mg under the tongue every 5 (five) minutes as needed for chest pain.   pantoprazole (PROTONIX) 40 MG tablet Take 1 tablet (40 mg total) by mouth 2 (two) times daily.   potassium chloride (KLOR-CON) 10 MEQ tablet Take 20 mEq by mouth daily.   pregabalin (LYRICA) 25 MG capsule Take 25 mg by mouth 2 (two) times daily.   promethazine (PHENERGAN) 25 MG suppository Place 25 mg rectally daily as needed for nausea.   Vitamin D, Ergocalciferol, (DRISDOL) 1.25 MG (50000 UNIT) CAPS capsule Take 50,000 Units by mouth once a week.   Radiology:   No results found.  Cardiac Studies:  PCV ECHOCARDIOGRAM COMPLETE 11/23/2021 Left ventricle cavity is normal in size and wall thickness. Abnormal septal wall motion due to post-operative coronary artery bypass graft. Normal LV systolic function with EF 61%. Doppler evidence of grade I (impaired) diastolic dysfunction,  normal LAP. No significant valvular abnormality. Normal right atrial pressure.  Echocardiogram 09/23/2021: 1. Left ventricular ejection fraction, by estimation, is 45 to 50%. Left  ventricular ejection fraction by PLAX is 54 %. The left ventricle has  normal function. The left ventricle demonstrates regional wall motion  abnormalities  Left ventricular diastolic parameters  are consistent with Grade III diastolic dysfunction (restrictive).  Elevated left ventricular end-diastolic pressure. There is mild  hypokinesis of the left ventricular, entire  inferoseptal  wall, inferior wall and inferolateral wall.   2. Right ventricular systolic function is normal. The right ventricular  size is normal. There is moderately elevated pulmonary artery systolic pressure. The estimated right ventricular systolic pressure is 0000000 mmHg.   3. Left atrial size was mildly dilated.   4. Moderate to at most moderately severe posteriorly directed MR.  Consider papillary muscle dysfunction. The mitral valve is normal in  structure. Moderate to severe mitral valve regurgitation. No evidence of  mitral stenosis.   5. The aortic valve is normal in structure. Aortic valve regurgitation is not visualized. No aortic stenosis is present.   6. The inferior vena cava is dilated in size with <50% respiratory  variability, suggesting right atrial pressure of 15 mmHg.    Left Heart Catheterization 09/23/21:  RCA diffuse in the proximal and mid RCA with ulceration and scattered 80-99% stenosis.  Successful PCI to ulcerated high grade native RCA with implantation of 3.0x48 mm Synergy SD in the proximal to mid RCA. Timi 3 to 3 flow. LM diffuse 80% stenosis.  LAD patent with mild disease and LIMA to LAD patent.  CX: Moderate OM1 widely patent. Cx severely diseased after origin of OM 1 with 90-95% stenosis.  Small diseased OM-2 RI: Small with ostial mild disease.  SVG to OM1-2 skip graft occluded, SVG to RCA occluded.  LV: EF 35-40%  with inferior akinesis. No MR.  Hemodynamics: LV 112/18, EDP 25 mm Hg. Ao 114/80, mean 112 mm Hg. No pressure gradient across the AV.  90 mL contrast used   CABG 04/29/2016: LIMA to LAD, SVG to RCA, SVG to OM1, SVG to OM2 for left main 90% stenosis 50% stenosis in circumflex and 60% stenosis in OM1, 50% stenosis in proximal RCA.  EKG:  05/06/2022: Sinus rhythm at a rate of 65 bpm.  Normal axis.  Nonspecific T wave abnormalities.  EKG 10/01/2021: Normal sinus rhythm.  Normal EKG.  No evidence of ischemia.   EKG 09/30/2021: Atrial fibrillation with rapid ventricular response at rate of 130 bpm, normal axis, no evidence of ischemia.  Normal QT interval.  Assessment     ICD-10-CM   1. Coronary artery disease involving native coronary artery of native heart with unstable angina pectoris (HCC)  I25.110 EKG 12-Lead    PCV CAROTID DUPLEX (BILATERAL)    2. Bilateral carotid bruits  R09.89 PCV CAROTID DUPLEX (BILATERAL)    3. Essential hypertension  I10        Medications Discontinued During This Encounter  Medication Reason   atorvastatin (LIPITOR) 80 MG tablet    ferrous sulfate 325 (65 FE) MG EC tablet    fluticasone (FLOVENT HFA) 110 MCG/ACT inhaler    promethazine (PHENERGAN) 25 MG tablet    vitamin B-12 (CYANOCOBALAMIN) 1000 MCG tablet      No orders of the defined types were placed in this encounter.   Recommendations:   [name] is a 66 y.o. 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   Patient required admission to the ICU following cardiac catheterization with acute COPD exacerbation and heart failure exacerbation requiring ventilator support. Patient developed atrial fibrillation with RVR and subsequently converted to normal sinus rhythm.  Echocardiogram  during admission showed moderate to severe MR with concern of papillary muscle dysfunction secondary to ischemia.    Patient  was last seen in the office 11/23/2021 at which time echocardiogram revealed preserved LVEF and no significant valvular abnormality, grade 1 diastolic dysfunction.  Patient was otherwise stable from a cardiovascular standpoint at last visit no changes were made.  She now presents for 110-month follow-up.  Patient continues to feel well overall from a cardiac standpoint.  She continues to follow closely with pulmonology for management of underlying COPD and asthma, using her albuterol inhaler more regularly.  Blood pressure was elevated initially in the office, it is improved on recheck.  Blood pressure is also well controlled on home monitoring. She continues to tolerate Eliquis and Plavix without bleeding diathesis, will continue this.   Reviewed and discussed results of echocardiogram, details above.  Repeat echo showed no evidence of MR and preserved LVEF.  Given new carotid bruits noted on exam we will obtain carotid artery duplex.  Follow-up in 6 months, sooner if needed, for hypertension, CAD, hyperlipidemia, a fib.   Rayford Halsted, PA-C 05/06/2022, 10:36 AM Office: [phone]

## 2022-05-24 ENCOUNTER — Ambulatory Visit: Payer: Commercial Managed Care - HMO | Admitting: Student

## 2022-05-26 ENCOUNTER — Other Ambulatory Visit: Payer: Self-pay | Admitting: Nurse Practitioner

## 2022-06-11 ENCOUNTER — Other Ambulatory Visit: Payer: Commercial Managed Care - HMO

## 2022-06-17 ENCOUNTER — Ambulatory Visit: Payer: Commercial Managed Care - HMO

## 2022-06-17 DIAGNOSIS — I25110 Atherosclerotic heart disease of native coronary artery with unstable angina pectoris: Secondary | ICD-10-CM

## 2022-06-17 DIAGNOSIS — R0989 Other specified symptoms and signs involving the circulatory and respiratory systems: Secondary | ICD-10-CM

## 2022-08-24 ENCOUNTER — Ambulatory Visit: Payer: Commercial Managed Care - HMO | Admitting: Pulmonary Disease

## 2022-08-24 NOTE — Progress Notes (Deleted)
Synopsis: Referred in February 2023 for Hospital Follow up   Subjective:   PATIENT ID: [name] GENDER: female DOB: October 25, 1955, MRN: [id number]  HPI  No chief complaint on file.  [name] is a 66 year old woman, daily smoker with COPD. DMII, GERD and hypertension who returns to pulmonary clinic for COPD.   Patient's ONO results on room air show an SPO2 less than 88% for 13 minutes.  She does qualify for supplemental oxygen at night.  She is to continue using 2 L of supplemental oxygen.   Initial OV 11/20/21 She was admitted 12/7 - 12/16 for STEMI s/p PCI to RCA, atrial fibrillation with RVR, and acute respiratory failure in setting of COPD exacerbation and pneumonia. Echo demonstarted moderate to severe mitral regurgitation concerning for papillary muscle dysfunction secondary to ischemia.   She was discharged home on 1L of home O2 and stiolto inhaler with as needed albuterol inhaler. Stopped using oxygen during day 1 week ago due to a sore in her nose. She continues to use O2 at night when sleeping  She has issues with allergies/molds so depending on the season she has harder time with her breathing. Perfumes can trigger her breathing. She avoids aerosol products, as these have bothered her breathing since childhood. She was treated for asthma when she was younger.   She has not smoked since the hospital admission. She is on 7mg  nicotine patches as needed and using nicotine lozenges as needed. She lives with her husband on a [place] farm. Her physical activity is limited due to sciatica symptoms.  Past Medical History:  Diagnosis Date   Asthma    Benign essential HTN    COPD (chronic obstructive pulmonary disease) (HCC)    IBS (irritable bowel syndrome)    Migraine headache      Family History  Problem Relation Age of Onset   Hypertension Mother    CAD Brother        Deceased on MI age 31   Cancer - Other Father        Oropharyngeal SCCa     Social History    Socioeconomic History   Marital status: Married    Spouse name: Not on file   Number of children: Not on file   Years of education: Not on file   Highest education level: Not on file  Occupational History   Not on file  Tobacco Use   Smoking status: Some Days    Packs/day: 1.00    Types: Cigarettes   Smokeless tobacco: Never  Vaping Use   Vaping Use: Never used  Substance and Sexual Activity   Alcohol use: Yes    Alcohol/week: 0.0 standard drinks of alcohol    Comment: Occasional to Rare   Drug use: No   Sexual activity: Not on file  Other Topics Concern   Not on file  Social History Narrative   Not on file   Social Determinants of Health   Financial Resource Strain: Not on file  Food Insecurity: Not on file  Transportation Needs: Not on file  Physical Activity: Not on file  Stress: Not on file  Social Connections: Not on file  Intimate Partner Violence: Not on file     Allergies  Allergen Reactions   Cefepime Anaphylaxis, Shortness Of Breath, Swelling and Dermatitis   Edoxaban Other (See Comments)    Causes hematomas   Penicillins Anaphylaxis   Barium Iodide Rash   Other     Other reaction(s):  Other Preservatives  (C-Diff) Other reaction(s): Unknown Preservatives  (C-Diff) Other reaction(s): OTHER   Acetaminophen     Will not take after Cyanide deaths, something changed when they mkt'd again.  Can take Childrens Liquid but sometimes upset stomach too   Albuterol Hypertension    ABLE TO TOLERATE IN INHALER FORM NOT NEBULIZER    Aspirin     Makes anemic   Beta Adrenergic Blockers     Paradoxical reactions   Diltiazem Other (See Comments)    Patient is intolerant to all CALCIUM CHANNEL BLOCKERS, causes her blood pressure to rise.    Lisinopril     Can only take her own mfr.     Oxycodone    Prednisone     REACTION: ibs probs   Propofol     REACTION: FEVER   Repatha [Evolocumab] Other (See Comments)    Knocked her out for 10 days   Statins     GI  issues   Sulfamethoxazole-Trimethoprim     REACTION: antibiotic induced colitis   Milk (Cow) Other (See Comments)     Outpatient Medications Prior to Visit  Medication Sig Dispense Refill   albuterol (PROVENTIL HFA;VENTOLIN HFA) 108 (90 BASE) MCG/ACT inhaler Inhale 2 puffs into the lungs 3 (three) times daily as needed for wheezing or shortness of breath.     apixaban (ELIQUIS) 5 MG TABS tablet Take 1 tablet (5 mg total) by mouth 2 (two) times daily. 180 tablet 3   atorvastatin (LIPITOR) 80 MG tablet Take 80 mg by mouth daily.     Budeson-Glycopyrrol-Formoterol (BREZTRI AEROSPHERE) 160-9-4.8 MCG/ACT AERO Inhale into the lungs.     clopidogrel (PLAVIX) 75 MG tablet Take 1 tablet (75 mg total) by mouth daily. 90 tablet 3   Cyanocobalamin 2000 MCG/ML SOLN Inject as directed.     dapagliflozin propanediol (FARXIGA) 10 MG TABS tablet Take 1 tablet (10 mg total) by mouth daily. 90 tablet 3   diltiazem (CARDIZEM CD) 120 MG 24 hr capsule Take 1 capsule (120 mg total) by mouth daily. 90 capsule 3   furosemide (LASIX) 20 MG tablet Take 1 tablet (20 mg total) by mouth daily. (Patient taking differently: Take 20 mg by mouth as needed.) 90 tablet 3   metoprolol tartrate (LOPRESSOR) 50 MG tablet Take 1 tablet (50 mg total) by mouth 2 (two) times daily. 180 tablet 3   nitroGLYCERIN (NITROSTAT) 0.4 MG SL tablet Place 0.4 mg under the tongue every 5 (five) minutes as needed for chest pain.     pantoprazole (PROTONIX) 40 MG tablet Take 1 tablet (40 mg total) by mouth 2 (two) times daily. 60 tablet 0   potassium chloride (KLOR-CON) 10 MEQ tablet Take 20 mEq by mouth daily.     pregabalin (LYRICA) 25 MG capsule Take 25 mg by mouth 2 (two) times daily.     promethazine (PHENERGAN) 25 MG suppository Place 25 mg rectally daily as needed for nausea.     Vitamin D, Ergocalciferol, (DRISDOL) 1.25 MG (50000 UNIT) CAPS capsule Take 50,000 Units by mouth once a week.     No facility-administered medications prior to  visit.   Review of Systems  Constitutional:  Negative for chills, fever, malaise/fatigue and weight loss.  HENT:  Negative for congestion, sinus pain and sore throat.   Eyes: Negative.   Respiratory:  Positive for shortness of breath. Negative for cough, hemoptysis, sputum production and wheezing.   Cardiovascular:  Negative for chest pain, palpitations, orthopnea, claudication and leg swelling.  Gastrointestinal:  Negative for abdominal pain, heartburn, nausea and vomiting.  Genitourinary: Negative.   Musculoskeletal:  Negative for joint pain and myalgias.       Shoulder pain  Skin:  Negative for rash.  Neurological:  Negative for weakness.  Endo/Heme/Allergies: Negative.   Psychiatric/Behavioral: Negative.      Objective:   There were no vitals filed for this visit.  Physical Exam Constitutional:      General: She is not in acute distress.    Appearance: She is not ill-appearing.  HENT:     Head: Normocephalic and atraumatic.  Eyes:     General: No scleral icterus.    Conjunctiva/sclera: Conjunctivae normal.     Pupils: Pupils are equal, round, and reactive to light.  Cardiovascular:     Rate and Rhythm: Normal rate and regular rhythm.     Pulses: Normal pulses.     Heart sounds: Normal heart sounds. No murmur heard. Pulmonary:     Effort: Pulmonary effort is normal.     Breath sounds: Normal breath sounds. Decreased air movement present. No wheezing, rhonchi or rales.  Abdominal:     General: Bowel sounds are normal.     Palpations: Abdomen is soft.  Musculoskeletal:     Right lower leg: No edema.     Left lower leg: No edema.  Lymphadenopathy:     Cervical: No cervical adenopathy.  Skin:    General: Skin is warm and dry.  Neurological:     General: No focal deficit present.     Mental Status: She is alert.  Psychiatric:        Mood and Affect: Mood normal.        Behavior: Behavior normal.        Thought Content: Thought content normal.        Judgment:  Judgment normal.     CBC    Component Value Date/Time   WBC 11.6 (H) 04/19/2022 0730   RBC 4.18 04/19/2022 0730   HGB 12.3 04/19/2022 0730   HGB 12.3 04/16/2022 1411   HCT 38.1 04/19/2022 0730   HCT 36.9 04/16/2022 1411   PLT 285 04/19/2022 0730   PLT 448 (H) 03/23/2022 0905   MCV 91.1 04/19/2022 0730   MCH 29.4 04/19/2022 0730   MCHC 32.3 04/19/2022 0730   RDW 17.1 (H) 04/19/2022 0730   LYMPHSABS 2.7 04/19/2022 0730   MONOABS 0.9 04/19/2022 0730   EOSABS 0.1 04/19/2022 0730   BASOSABS 0.1 04/19/2022 0730      Latest Ref Rng & Units 03/23/2022    9:05 AM 12/21/2021    9:48 AM 10/02/2021    4:49 AM  BMP  Glucose 70 - 99 mg/dL 423  536  144   BUN 8 - 23 mg/dL 12  10  13    Creatinine 0.44 - 1.00 mg/dL  3.15  4.00   Sodium 135 - 145 mmol/L 138  138  134   Potassium 3.5 - 5.1 mmol/L 4.4  3.8  4.1   Chloride 98 - 111 mmol/L 103  104  99   CO2 22 - 32 mmol/L 30  28  28    Calcium 8.9 - 10.3 mg/dL 9.6  9.6  8.1    Chest imaging: CTA Chest 10/01/21 1. No evidence of pulmonary embolism. 2. Cardiomegaly with small bilateral pleural effusions lying dependently and some passive subsegmental atelectasis in the lower lobes of the lungs bilaterally. 3. Patchy areas of ground-glass attenuation in the lungs, most evident in the left  upper lobe near the apex. These may be of infectious or inflammatory etiology. 4. Aortic atherosclerosis, in addition to three-vessel coronary artery disease. Status post median sternotomy for CABG including LIMA to the LAD. 5. Moderate-sized hiatal hernia. 6. Additional incidental findings, as above.  PFT:     No data to display          Labs:  Path:  Echo 09/23/21: LV EF 45-50%. Grade III diastolic dysfunction. RV size and function is normal. RVSP 50.69mmHg. Moderate to moderately severe posteriorly directed MR.   Heart Catheterization:  Assessment & Plan:   No diagnosis found.  Discussion: [name] is a 66 year old woman,  daily smoker with COPD. DMII, GERD and hypertension who is referred to pulmonary clinic for hospital follow up for COPD exacerbation and pneumonia.   She has recovered well since her hospitalization. She is to continue stiolto 2 puffs daily and will add flovent 2 puffs twice daily for her asthma component to her COPD.   She is to continue O2 at night and we will check an overnight oximitry test on room air.   She has deferred simple walk in clinic today due to her sciatica pain.   She will need referral to lung cancer screening program in the future.  Follow up in 4 months with pulmonary function tests.  Melody Comas, MD  Pulmonary & Critical Care Office: [phone]   Current Outpatient Medications:    albuterol (PROVENTIL HFA;VENTOLIN HFA) 108 (90 BASE) MCG/ACT inhaler, Inhale 2 puffs into the lungs 3 (three) times daily as needed for wheezing or shortness of breath., Disp: , Rfl:    apixaban (ELIQUIS) 5 MG TABS tablet, Take 1 tablet (5 mg total) by mouth 2 (two) times daily., Disp: 180 tablet, Rfl: 3   atorvastatin (LIPITOR) 80 MG tablet, Take 80 mg by mouth daily., Disp: , Rfl:    Budeson-Glycopyrrol-Formoterol (BREZTRI AEROSPHERE) 160-9-4.8 MCG/ACT AERO, Inhale into the lungs., Disp: , Rfl:    clopidogrel (PLAVIX) 75 MG tablet, Take 1 tablet (75 mg total) by mouth daily., Disp: 90 tablet, Rfl: 3   Cyanocobalamin 2000 MCG/ML SOLN, Inject as directed., Disp: , Rfl:    dapagliflozin propanediol (FARXIGA) 10 MG TABS tablet, Take 1 tablet (10 mg total) by mouth daily., Disp: 90 tablet, Rfl: 3   diltiazem (CARDIZEM CD) 120 MG 24 hr capsule, Take 1 capsule (120 mg total) by mouth daily., Disp: 90 capsule, Rfl: 3   furosemide (LASIX) 20 MG tablet, Take 1 tablet (20 mg total) by mouth daily. (Patient taking differently: Take 20 mg by mouth as needed.), Disp: 90 tablet, Rfl: 3   metoprolol tartrate (LOPRESSOR) 50 MG tablet, Take 1 tablet (50 mg total) by mouth 2 (two) times  daily., Disp: 180 tablet, Rfl: 3   nitroGLYCERIN (NITROSTAT) 0.4 MG SL tablet, Place 0.4 mg under the tongue every 5 (five) minutes as needed for chest pain., Disp: , Rfl:    pantoprazole (PROTONIX) 40 MG tablet, Take 1 tablet (40 mg total) by mouth 2 (two) times daily., Disp: 60 tablet, Rfl: 0   potassium chloride (KLOR-CON) 10 MEQ tablet, Take 20 mEq by mouth daily., Disp: , Rfl:    pregabalin (LYRICA) 25 MG capsule, Take 25 mg by mouth 2 (two) times daily., Disp: , Rfl:    promethazine (PHENERGAN) 25 MG suppository, Place 25 mg rectally daily as needed for nausea., Disp: , Rfl:    Vitamin D, Ergocalciferol, (DRISDOL) 1.25 MG (50000 UNIT) CAPS capsule, Take 50,000 Units  by mouth once a week., Disp: , Rfl:

## 2022-09-07 ENCOUNTER — Telehealth: Payer: Self-pay

## 2022-09-07 NOTE — Telephone Encounter (Signed)
Patient called and stated that she is currently taking Metoprolol 50 mg BID. She has been experiencing dizziness and headaches, but a little shocking pain in her chest, but but nothing"steady" or "serious". Do you want me to have patient scheduled an appointment.

## 2022-09-20 ENCOUNTER — Telehealth: Payer: Self-pay | Admitting: Hematology and Oncology

## 2022-09-20 NOTE — Telephone Encounter (Signed)
Patient called to r/s upcoming appointment. Patient r/s and notified.  

## 2022-09-21 ENCOUNTER — Inpatient Hospital Stay: Payer: Commercial Managed Care - HMO | Admitting: Hematology and Oncology

## 2022-09-21 ENCOUNTER — Inpatient Hospital Stay: Payer: Commercial Managed Care - HMO

## 2022-09-22 ENCOUNTER — Ambulatory Visit: Payer: Commercial Managed Care - HMO | Admitting: Hematology and Oncology

## 2022-09-22 ENCOUNTER — Other Ambulatory Visit: Payer: Commercial Managed Care - HMO

## 2022-09-23 NOTE — Telephone Encounter (Signed)
Could you please ask her BP recordings and also to check BP sitting and standing

## 2022-09-24 NOTE — Telephone Encounter (Signed)
Called patient, NA, no VM-box to leave message.

## 2022-09-29 ENCOUNTER — Ambulatory Visit: Payer: Self-pay | Admitting: Internal Medicine

## 2022-10-13 ENCOUNTER — Ambulatory Visit: Payer: Self-pay | Admitting: Internal Medicine

## 2022-10-13 DIAGNOSIS — I25110 Atherosclerotic heart disease of native coronary artery with unstable angina pectoris: Secondary | ICD-10-CM

## 2022-10-14 ENCOUNTER — Inpatient Hospital Stay: Payer: Commercial Managed Care - HMO

## 2022-10-14 ENCOUNTER — Inpatient Hospital Stay: Payer: Commercial Managed Care - HMO | Admitting: Hematology and Oncology

## 2022-10-28 ENCOUNTER — Inpatient Hospital Stay (HOSPITAL_BASED_OUTPATIENT_CLINIC_OR_DEPARTMENT_OTHER): Payer: PPO | Admitting: Hematology and Oncology

## 2022-10-28 ENCOUNTER — Inpatient Hospital Stay: Payer: PPO | Attending: Hematology and Oncology

## 2022-10-28 ENCOUNTER — Other Ambulatory Visit: Payer: Self-pay | Admitting: Hematology and Oncology

## 2022-10-28 ENCOUNTER — Other Ambulatory Visit: Payer: Self-pay

## 2022-10-28 VITALS — BP 133/62 | HR 65 | Temp 97.7°F | Resp 18 | Wt 120.9 lb

## 2022-10-28 DIAGNOSIS — I10 Essential (primary) hypertension: Secondary | ICD-10-CM | POA: Diagnosis not present

## 2022-10-28 DIAGNOSIS — Z885 Allergy status to narcotic agent status: Secondary | ICD-10-CM | POA: Insufficient documentation

## 2022-10-28 DIAGNOSIS — Z881 Allergy status to other antibiotic agents status: Secondary | ICD-10-CM | POA: Insufficient documentation

## 2022-10-28 DIAGNOSIS — Z886 Allergy status to analgesic agent status: Secondary | ICD-10-CM | POA: Insufficient documentation

## 2022-10-28 DIAGNOSIS — Z7902 Long term (current) use of antithrombotics/antiplatelets: Secondary | ICD-10-CM | POA: Diagnosis not present

## 2022-10-28 DIAGNOSIS — D72829 Elevated white blood cell count, unspecified: Secondary | ICD-10-CM | POA: Diagnosis not present

## 2022-10-28 DIAGNOSIS — D72825 Bandemia: Secondary | ICD-10-CM

## 2022-10-28 DIAGNOSIS — Z7901 Long term (current) use of anticoagulants: Secondary | ICD-10-CM | POA: Insufficient documentation

## 2022-10-28 DIAGNOSIS — Z880 Allergy status to penicillin: Secondary | ICD-10-CM | POA: Insufficient documentation

## 2022-10-28 DIAGNOSIS — D75839 Thrombocytosis, unspecified: Secondary | ICD-10-CM | POA: Diagnosis not present

## 2022-10-28 DIAGNOSIS — Z808 Family history of malignant neoplasm of other organs or systems: Secondary | ICD-10-CM | POA: Insufficient documentation

## 2022-10-28 DIAGNOSIS — Z888 Allergy status to other drugs, medicaments and biological substances status: Secondary | ICD-10-CM | POA: Insufficient documentation

## 2022-10-28 DIAGNOSIS — Z8249 Family history of ischemic heart disease and other diseases of the circulatory system: Secondary | ICD-10-CM | POA: Diagnosis not present

## 2022-10-28 DIAGNOSIS — D649 Anemia, unspecified: Secondary | ICD-10-CM | POA: Diagnosis not present

## 2022-10-28 DIAGNOSIS — F17210 Nicotine dependence, cigarettes, uncomplicated: Secondary | ICD-10-CM | POA: Insufficient documentation

## 2022-10-28 DIAGNOSIS — Z79899 Other long term (current) drug therapy: Secondary | ICD-10-CM | POA: Insufficient documentation

## 2022-10-28 DIAGNOSIS — D500 Iron deficiency anemia secondary to blood loss (chronic): Secondary | ICD-10-CM

## 2022-10-28 DIAGNOSIS — Z1509 Genetic susceptibility to other malignant neoplasm: Secondary | ICD-10-CM | POA: Insufficient documentation

## 2022-10-28 LAB — CMP (CANCER CENTER ONLY)
ALT: 9 U/L (ref 0–44)
AST: 13 U/L — ABNORMAL LOW (ref 15–41)
Albumin: 3.9 g/dL (ref 3.5–5.0)
Alkaline Phosphatase: 61 U/L (ref 38–126)
Anion gap: 3 — ABNORMAL LOW (ref 5–15)
BUN: 13 mg/dL (ref 8–23)
CO2: 32 mmol/L (ref 22–32)
Calcium: 8.8 mg/dL — ABNORMAL LOW (ref 8.9–10.3)
Chloride: 102 mmol/L (ref 98–111)
Creatinine: 0.92 mg/dL (ref 0.44–1.00)
GFR, Estimated: >60 mL/min (ref >60)
Glucose, Bld: 157 mg/dL — ABNORMAL HIGH (ref 70–99)
Potassium: 4.6 mmol/L (ref 3.5–5.1)
Sodium: 137 mmol/L (ref 135–145)
Total Bilirubin: 0.2 mg/dL — ABNORMAL LOW (ref 0.3–1.2)
Total Protein: 5.9 g/dL — ABNORMAL LOW (ref 6.5–8.1)

## 2022-10-28 LAB — CBC WITH DIFFERENTIAL (CANCER CENTER ONLY)
Abs Immature Granulocytes: 0.04 10*3/uL (ref 0.00–0.07)
Basophils Absolute: 0.1 10*3/uL (ref 0.0–0.1)
Basophils Relative: 1 %
Eosinophils Absolute: 0.1 10*3/uL (ref 0.0–0.5)
Eosinophils Relative: 1 %
HCT: 35.8 % — ABNORMAL LOW (ref 36.0–46.0)
Hemoglobin: 11.8 g/dL — ABNORMAL LOW (ref 12.0–15.0)
Immature Granulocytes: 0 %
Lymphocytes Relative: 23 %
Lymphs Abs: 2.2 10*3/uL (ref 0.7–4.0)
MCH: 30.6 pg (ref 26.0–34.0)
MCHC: 33 g/dL (ref 30.0–36.0)
MCV: 93 fL (ref 80.0–100.0)
Monocytes Absolute: 0.7 10*3/uL (ref 0.1–1.0)
Monocytes Relative: 8 %
Neutro Abs: 6.3 10*3/uL (ref 1.7–7.7)
Neutrophils Relative %: 67 %
Platelet Count: 284 10*3/uL (ref 150–400)
RBC: 3.85 MIL/uL — ABNORMAL LOW (ref 3.87–5.11)
RDW: 15.7 % — ABNORMAL HIGH (ref 11.5–15.5)
WBC Count: 9.4 10*3/uL (ref 4.0–10.5)
nRBC: 0 % (ref 0.0–0.2)

## 2022-10-28 LAB — LACTATE DEHYDROGENASE: LDH: 139 U/L (ref 98–192)

## 2022-10-28 NOTE — Progress Notes (Signed)
[place] [phone]   [phone]  PROGRESS NOTE  Patient Care Team: Elenore Paddy, NP as PCP - General (Nurse Practitioner)  Hematological/Oncological History # Leukocytosis, Neutrophilic Predominance #Normocytic Anemia #Thrombocytosis 03/29/2020: WBC 40.6, Hgb 14.4, MCV 85.1, Plt 402 09/23/2021: WBC 24.3, Hgb 8.8, MCV 88.7, Plt 413 09/26/2021: WBC 21.7, Hgb 8.1, MCV 90.1, Plt 410 12/21/2021: establish care with Dr. Lorenso Courier   Interval History:  [name] 67 y.o. 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 The patient's last visit was on 03/23/2022 at which time she established care. In the interim since the last visit she has had no major changes in her health.  On exam today Mrs. Leeman reports has been well overall interim since her last visit.  She reports her health is "the same old same old".  She notes that she has had no changes.  She denies any new medications, diagnoses, ED visits, or hospital stays.  She reports her energy levels are "really low".  She notes she also had a big dip in her weight down to 105 pounds and is currently working her way up to 120 pounds.  She is unsure why this happened.  She reports that she had a weight loss episode once where she dropped down to 93 pounds.  She reports that she "prefers to be a little heavier".  She notes her bone marrow biopsy procedure went well with just a little bit of pain.  She is also currently taking oxygen at night.  Her appetite is good and she is not having any fevers, chills, sweats, nausea, vomiting or diarrhea.  A full 10 point ROS was otherwise negative.  MEDICAL HISTORY:  Past Medical History:  Diagnosis Date   Asthma    Benign essential HTN    COPD (chronic obstructive pulmonary disease) (HCC)    IBS (irritable bowel syndrome)    Migraine headache     SURGICAL HISTORY: Past Surgical History:   Procedure Laterality Date   CORONARY/GRAFT ACUTE MI REVASCULARIZATION N/A 09/22/2021   Procedure: Coronary/Graft Acute MI Revascularization;  Surgeon: Adrian Prows, MD;  Location: [facility];  Service: Cardiovascular;  Laterality: N/A;   LEFT HEART CATH AND CORONARY ANGIOGRAPHY N/A 09/22/2021   Procedure: LEFT HEART CATH AND CORONARY ANGIOGRAPHY;  Surgeon: Adrian Prows, MD;  Location: [facility];  Service: Cardiovascular;  Laterality: N/A;   REVERSE SHOULDER ARTHROPLASTY Left 09/16/2021   Procedure: REVERSE SHOULDER ARTHROPLASTY;  Surgeon: Netta Cedars, MD;  Location: [facility];  Service: Orthopedics;  Laterality: Left;    SOCIAL HISTORY: Social History   Socioeconomic History   Marital status: Married    Spouse name: Not on file   Number of children: Not on file   Years of education: Not on file   Highest education level: Not on file  Occupational History   Not on file  Tobacco Use   Smoking status: Some Days    Packs/day: 1.00    Types: Cigarettes   Smokeless tobacco: Never  Vaping Use   Vaping Use: Never used  Substance and Sexual Activity   Alcohol use: Yes    Alcohol/week: 0.0 standard drinks of alcohol    Comment: Occasional to Rare   Drug use: No   Sexual activity: Not on file  Other Topics Concern   Not on file  Social History Narrative   Not on file   Social Determinants of Health  Financial Resource Strain: Not on file  Food Insecurity: Not on file  Transportation Needs: Not on file  Physical Activity: Not on file  Stress: Not on file  Social Connections: Not on file  Intimate Partner Violence: Not on file    FAMILY HISTORY: Family History  Problem Relation Age of Onset   Hypertension Mother    CAD Brother        Deceased on MI age 66   Cancer - Other Father        Oropharyngeal SCCa    ALLERGIES:  is allergic to cefepime, edoxaban, penicillins, barium iodide, other, acetaminophen, albuterol, aspirin, beta adrenergic blockers, diltiazem,  lisinopril, oxycodone, prednisone, propofol, repatha [evolocumab], statins, sulfamethoxazole-trimethoprim, and milk (cow).  MEDICATIONS:  Current Outpatient Medications  Medication Sig Dispense Refill   albuterol (PROVENTIL HFA;VENTOLIN HFA) 108 (90 BASE) MCG/ACT inhaler Inhale 2 puffs into the lungs 3 (three) times daily as needed for wheezing or shortness of breath.     apixaban (ELIQUIS) 5 MG TABS tablet Take 1 tablet (5 mg total) by mouth 2 (two) times daily. 180 tablet 3   atorvastatin (LIPITOR) 80 MG tablet Take 80 mg by mouth daily.     Budeson-Glycopyrrol-Formoterol (BREZTRI AEROSPHERE) 160-9-4.8 MCG/ACT AERO Inhale into the lungs.     clopidogrel (PLAVIX) 75 MG tablet Take 1 tablet (75 mg total) by mouth daily. 90 tablet 3   Cyanocobalamin 2000 MCG/ML SOLN Inject as directed.     dapagliflozin propanediol (FARXIGA) 10 MG TABS tablet Take 1 tablet (10 mg total) by mouth daily. 90 tablet 3   diltiazem (CARDIZEM CD) 120 MG 24 hr capsule Take 1 capsule (120 mg total) by mouth daily. 90 capsule 3   furosemide (LASIX) 20 MG tablet Take 1 tablet (20 mg total) by mouth daily. (Patient taking differently: Take 20 mg by mouth as needed.) 90 tablet 3   metoprolol tartrate (LOPRESSOR) 50 MG tablet Take 1 tablet (50 mg total) by mouth 2 (two) times daily. 180 tablet 3   nitroGLYCERIN (NITROSTAT) 0.4 MG SL tablet Place 0.4 mg under the tongue every 5 (five) minutes as needed for chest pain.     pantoprazole (PROTONIX) 40 MG tablet Take 1 tablet (40 mg total) by mouth 2 (two) times daily. 60 tablet 0   potassium chloride (KLOR-CON) 10 MEQ tablet Take 20 mEq by mouth daily.     pregabalin (LYRICA) 25 MG capsule Take 25 mg by mouth 2 (two) times daily.     promethazine (PHENERGAN) 25 MG suppository Place 25 mg rectally daily as needed for nausea.     Vitamin D, Ergocalciferol, (DRISDOL) 1.25 MG (50000 UNIT) CAPS capsule Take 50,000 Units by mouth once a week.     No current facility-administered  medications for this visit.    REVIEW OF SYSTEMS:   Constitutional: ( - ) fevers, ( - )  chills , ( - ) night sweats Eyes: ( - ) blurriness of vision, ( - ) double vision, ( - ) watery eyes Ears, nose, mouth, throat, and face: ( - ) mucositis, ( - ) sore throat Respiratory: ( - ) cough, ( - ) dyspnea, ( - ) wheezes Cardiovascular: ( - ) palpitation, ( - ) chest discomfort, ( - ) lower extremity swelling Gastrointestinal:  ( - ) nausea, ( - ) heartburn, ( - ) change in bowel habits Skin: ( - ) abnormal skin rashes Lymphatics: ( - ) new lymphadenopathy, ( - ) easy bruising Neurological: ( - ) numbness, ( - )  tingling, ( - ) new weaknesses Behavioral/Psych: ( - ) mood change, ( - ) new changes  All other systems were reviewed with the patient and are negative.  PHYSICAL EXAMINATION:  Vitals:   10/28/22 1420  BP: 133/62  Pulse: 65  Resp: 18  Temp: 97.7 F (36.5 C)  SpO2: 97%   Filed Weights   10/28/22 1420  Weight: 120 lb 14.4 oz (54.8 kg)    GENERAL: well appearing middle aged Caucasian female, alert, no distress and comfortable SKIN: skin color, texture, turgor are normal, no rashes or significant lesions EYES: conjunctiva are pink and non-injected, sclera clear LUNGS: clear to auscultation and percussion with normal breathing effort HEART: regular rate & rhythm and no murmurs and no lower extremity edema Musculoskeletal: no cyanosis of digits and no clubbing  PSYCH: alert & oriented x 3, fluent speech NEURO: no focal motor/sensory deficits  LABORATORY DATA:  I have reviewed the data as listed    Latest Ref Rng & Units 10/28/2022    1:40 PM 04/19/2022    7:30 AM 04/16/2022    2:11 PM  CBC  WBC 4.0 - 10.5 K/uL 9.4  11.6    Hemoglobin 12.0 - 15.0 g/dL 11.8  12.3  12.3   Hematocrit 36.0 - 46.0 % 35.8  38.1  36.9   Platelets 150 - 400 K/uL 284  285         Latest Ref Rng & Units 10/28/2022    1:40 PM 03/23/2022    9:05 AM 12/21/2021    9:48 AM  CMP  Glucose 70 - 99 mg/dL  157  120  115   BUN 8 - 23 mg/dL 13  12  10    Creatinine 0.44 - 1.00 mg/dL 0.92  0.98  0.81   Sodium 135 - 145 mmol/L 137  138  138   Potassium 3.5 - 5.1 mmol/L 4.6  4.4  3.8   Chloride 98 - 111 mmol/L 102  103  104   CO2 22 - 32 mmol/L 32  30  28   Calcium 8.9 - 10.3 mg/dL 8.8  9.6  9.6   Total Protein 6.5 - 8.1 g/dL 5.9  7.0  7.3   Total Bilirubin 0.3 - 1.2 mg/dL 0.2  0.4  0.4   Alkaline Phos 38 - 126 U/L 61  83  92   AST 15 - 41 U/L 13  13  11    ALT 0 - 44 U/L 9  20  7      RADIOGRAPHIC STUDIES: No results found.  ASSESSMENT & PLAN CITLALLI GARGUILO 66 y.o. 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   After review of the labs, review of the records, and discussion with the patient the patients findings are most consistent with leukocytosis 2/2 TP53 mutation noted on MPN panel.  Although in conjunction with other mutations and MPN's this can be indicative of a poor prognosis, the patient does not have any other genetic alterations.  She only has a mild leukocytosis of neutrophilic predominance.  Previously we offered her continued observation versus bone marrow biopsy.  Given the length of time this has been going on the patient would like to further evaluate with bone marrow biopsy.  The patient voiced understanding of this plan moving forward.   # Leukocytosis, Neutrophilic Predominance--resolved #Normocytic Anemia #Thrombocytosis--resolved. -- Findings at this time are most consistent with leukocytosis secondary to a TP53 mutation noted on MPN  panel --Leukocytosis was mild and there are currently no other abnormalities noted on CBC -- bone marrow biopsy performed showed normocelluar marrow with trilineage hematopoesis.  --Labs today show white blood cell count 9.4, Hgb 11.8, MCV 93, Plt 284 --Return to clinic in 6 months or labs in 12 months for clinic visit.  Offered patient to follow with primary care  provider given normalization of her leukocytosis and thrombocytosis.  She notes that she would continue to continue with [place] on a routine basis.  No orders of the defined types were placed in this encounter.   All questions were answered. The patient knows to call the clinic with any problems, questions or concerns.  A total of more than 30 minutes were spent on this encounter with face-to-face time and non-face-to-face time, including preparing to see the patient, ordering tests and/or medications, counseling the patient and coordination of care as outlined above.   Ulysees Barns, MD Department of Hematology/Oncology [facility] Phone: [phone] Pager: [phone] Email: [email or web address]  10/28/2022 4:14 PM

## 2022-11-08 NOTE — Progress Notes (Signed)
No show

## 2022-11-11 ENCOUNTER — Encounter: Payer: Self-pay | Admitting: Cardiology

## 2022-11-11 ENCOUNTER — Ambulatory Visit: Payer: PPO | Admitting: Cardiology

## 2022-11-11 VITALS — BP 120/77 | HR 70 | Ht 63.0 in | Wt 116.8 lb

## 2022-11-11 DIAGNOSIS — I10 Essential (primary) hypertension: Secondary | ICD-10-CM

## 2022-11-11 DIAGNOSIS — I739 Peripheral vascular disease, unspecified: Secondary | ICD-10-CM

## 2022-11-11 DIAGNOSIS — I480 Paroxysmal atrial fibrillation: Secondary | ICD-10-CM

## 2022-11-11 DIAGNOSIS — E7800 Pure hypercholesterolemia, unspecified: Secondary | ICD-10-CM

## 2022-11-11 DIAGNOSIS — Z951 Presence of aortocoronary bypass graft: Secondary | ICD-10-CM

## 2022-11-11 DIAGNOSIS — I25118 Atherosclerotic heart disease of native coronary artery with other forms of angina pectoris: Secondary | ICD-10-CM

## 2022-11-11 NOTE — Progress Notes (Signed)
Primary Physician/Referring:  Julianne Handler, NP  Patient ID: [name], female    DOB: [birth year], 67 y.o.   MRN: [id number]  Chief Complaint  Patient presents with   Coronary Artery Disease   Follow-up   HPI:    [name]  is a 67 y.o. 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    Patient states she is feeling well overall without specific complaints or concerns today except she has noticed significant cramping in her calves when she walks and has to stop frequently.  She monitors her blood pressure daily at home with average readings reported to be 110/70 mmHg in fact her blood pressure had been low and PCP tried to reduce the dose of the metoprolol but she developed palpitations and she is back on metoprolol.     Denies chest pain, change in dyspnea, orthopnea, PND, leg edema.  Past Medical History:  Diagnosis Date   Acute diverticulitis 03/30/2020   Acute ST elevation myocardial infarction (STEMI) involving other coronary artery of inferior wall (HCC) 09/23/2021   Acute ST elevation myocardial infarction (STEMI) of inferior wall (HCC) 09/22/2021   Asthma    Benign essential HTN    IBS (irritable bowel syndrome)    Migraine headache    Past Surgical History:  Procedure Laterality Date   CORONARY/GRAFT ACUTE MI REVASCULARIZATION N/A 09/22/2021   Procedure: Coronary/Graft Acute MI Revascularization;  Surgeon: Yates Decamp, MD;  Location: [facility];  Service: Cardiovascular;  Laterality: N/A;   LEFT HEART CATH AND CORONARY ANGIOGRAPHY N/A 09/22/2021   Procedure: LEFT HEART CATH  AND CORONARY ANGIOGRAPHY;  Surgeon: Yates Decamp, MD;  Location: [facility];  Service: Cardiovascular;  Laterality: N/A;   REVERSE SHOULDER ARTHROPLASTY Left 09/16/2021   Procedure: REVERSE SHOULDER ARTHROPLASTY;  Surgeon: Beverely Low, MD;  Location: [facility];  Service: Orthopedics;  Laterality: Left;   Family History  Problem Relation Age of Onset   Hypertension Mother    Cancer - Other Father        Oropharyngeal SCCa   CAD Brother        Deceased on MI age 26    Social History   Tobacco Use   Smoking status: Some Days    Packs/day: 1.00    Types: Cigarettes   Smokeless tobacco: Never  Substance Use Topics   Alcohol use: Yes    Alcohol/week: 0.0 standard drinks of alcohol    Comment: Occasional to Rare   Marital Status: Married   ROS  Review of Systems  Cardiovascular:  Negative for chest pain, dyspnea on exertion and leg swelling.    Objective  Blood pressure 120/77, pulse 70, height 5\' 3"  (1.6 m), weight 116 lb 12.8 oz (53 kg), SpO2 94 %.     11/11/2022    9:38 AM 11/11/2022    9:02 AM 10/28/2022    2:20 PM  Vitals with BMI  Height  5\' 3"    Weight  116 lbs 13 oz 120 lbs 14 oz  BMI  20.7   Systolic 120 178 12/27/2022  Diastolic 77 69 62  Pulse  70 65  Physical Exam Neck:     Vascular: Carotid bruit (bilateral) present. No JVD.  Cardiovascular:     Rate and Rhythm: Normal rate and regular rhythm.     Pulses:          Dorsalis pedis pulses are 0 on the right side and 0 on the left side.       Posterior tibial pulses are 0 on the right side and 0 on the left side.     Heart sounds: Heart sounds are distant. No murmur heard.    No gallop.  Pulmonary:     Effort: Pulmonary effort is normal.     Breath sounds: Decreased air movement present. Examination of the right-lower field reveals decreased breath sounds. Examination of the left-lower field reveals decreased breath sounds. Decreased breath sounds present.  Abdominal:     General: Bowel sounds are normal.      Palpations: Abdomen is soft.  Musculoskeletal:     Right lower leg: No edema.     Left lower leg: No edema.    Laboratory examination:   Recent Labs    12/21/21 0948 03/23/22 0905 10/28/22 1340  NA 138 138 137  K 3.8 4.4 4.6  CL 104 103 102  CO2 28 30 32  GLUCOSE 115* 120* 157*  BUN 10 12 13   CREATININE 0.81 0.98 0.92  CALCIUM 9.6 9.6 8.8*  GFRNONAA >60 >60 >60      Latest Ref Rng & Units 10/28/2022    1:40 PM 03/23/2022    9:05 AM 12/21/2021    9:48 AM  CMP  Glucose 70 - 99 mg/dL 02/20/2022  127  517   BUN 8 - 23 mg/dL 13  12  10    Creatinine 0.44 - 1.00 mg/dL 001   7.49   Sodium 135 - 145 mmol/L 137  138  138   Potassium 3.5 - 5.1 mmol/L 4.6  4.4  3.8   Chloride 98 - 111 mmol/L 102  103  104   CO2 22 - 32 mmol/L 32  30  28   Calcium 8.9 - 10.3 mg/dL 8.8  9.6  9.6   Total Protein 6.5 - 8.1 g/dL 5.9  7.0  7.3   Total Bilirubin 0.3 - 1.2 mg/dL 0.2  0.4  0.4   Alkaline Phos 38 - 126 U/L 61  83  92   AST 15 - 41 U/L 13  13  11    ALT 0 - 44 U/L 9  20  7        Latest Ref Rng & Units 10/28/2022    1:40 PM 04/19/2022    7:30 AM 04/16/2022    2:11 PM  CBC  WBC 4.0 - 10.5 K/uL 9.4  11.6    Hemoglobin 12.0 - 15.0 g/dL  12/27/2022  06/20/2022   Hematocrit 36.0 - 46.0 % 35.8  38.1  36.9   Platelets 150 - 400 K/uL 284  285     Lab Results  Component Value Date   CHOL 161 09/24/2021   HDL 27 (L) 09/24/2021   LDLCALC 110 (H) 09/24/2021   LDLDIRECT 173.8 10/24/2009   TRIG 121 09/24/2021   CHOLHDL 6.0 09/24/2021    HEMOGLOBIN A1C Lab Results  Component Value Date   HGBA1C 6.7 (H) 09/17/2021   MPG 146 09/17/2021   Lab Results  Component Value Date   TSH 0.177 (L) 09/24/2021    External labs:  None   Allergies   Allergies  Allergen Reactions  Cefepime Anaphylaxis, Shortness Of Breath, Swelling and Dermatitis   Edoxaban Other (See Comments)    Causes hematomas   Penicillins Anaphylaxis   Barium Iodide Rash   Other     Other reaction(s): Other Preservatives   (C-Diff) Other reaction(s): Unknown Preservatives  (C-Diff) Other reaction(s): OTHER   Acetaminophen     Will not take after Cyanide deaths, something changed when they mkt'd again.  Can take Childrens Liquid but sometimes upset stomach too   Albuterol Hypertension    ABLE TO TOLERATE IN INHALER FORM NOT NEBULIZER    Aspirin     Makes anemic   Beta Adrenergic Blockers     Paradoxical reactions   Diltiazem Other (See Comments)    Patient is intolerant to all CALCIUM CHANNEL BLOCKERS, causes her blood pressure to rise.    Lisinopril     Can only take her own mfr.     Oxycodone Other (See Comments)   Prednisone     REACTION: ibs probs   Propofol     REACTION: FEVER   Repatha [Evolocumab] Other (See Comments)    Knocked her out for 10 days   Statins     GI issues   Sulfamethoxazole-Trimethoprim     REACTION: antibiotic induced colitis   Triamcinolone Other (See Comments)   Milk (Cow) Other (See Comments)     Final Medications at End of Visit     Current Outpatient Medications:    albuterol (PROVENTIL HFA;VENTOLIN HFA) 108 (90 BASE) MCG/ACT inhaler, Inhale 2 puffs into the lungs 3 (three) times daily as needed for wheezing or shortness of breath., Disp: , Rfl:    apixaban (ELIQUIS) 5 MG TABS tablet, Take 1 tablet (5 mg total) by mouth 2 (two) times daily., Disp: 180 tablet, Rfl: 3   atorvastatin (LIPITOR) 80 MG tablet, Take 80 mg by mouth daily., Disp: , Rfl:    Budeson-Glycopyrrol-Formoterol (BREZTRI AEROSPHERE) 160-9-4.8 MCG/ACT AERO, Inhale into the lungs., Disp: , Rfl:    clopidogrel (PLAVIX) 75 MG tablet, Take 1 tablet (75 mg total) by mouth daily., Disp: 90 tablet, Rfl: 3   Cyanocobalamin 2000 MCG/ML SOLN, Inject as directed., Disp: , Rfl:    dapagliflozin propanediol (FARXIGA) 10 MG TABS tablet, Take 1 tablet (10 mg total) by mouth daily., Disp: 90 tablet, Rfl: 3   diltiazem (CARDIZEM CD) 120 MG 24 hr capsule, Take 1 capsule (120 mg total) by mouth daily., Disp: 90  capsule, Rfl: 3   furosemide (LASIX) 20 MG tablet, Take 1 tablet (20 mg total) by mouth daily. (Patient taking differently: Take 20 mg by mouth as needed.), Disp: 90 tablet, Rfl: 3   metoprolol tartrate (LOPRESSOR) 50 MG tablet, Take 1 tablet (50 mg total) by mouth 2 (two) times daily., Disp: 180 tablet, Rfl: 3   nitroGLYCERIN (NITROSTAT) 0.4 MG SL tablet, Place 0.4 mg under the tongue every 5 (five) minutes as needed for chest pain., Disp: , Rfl:    potassium chloride (KLOR-CON) 10 MEQ tablet, Take 20 mEq by mouth daily., Disp: , Rfl:    pregabalin (LYRICA) 25 MG capsule, Take 25 mg by mouth 2 (two) times daily., Disp: , Rfl:    promethazine (PHENERGAN) 25 MG suppository, Place 25 mg rectally daily as needed for nausea., Disp: , Rfl:    Vitamin D, Ergocalciferol, (DRISDOL) 1.25 MG (50000 UNIT) CAPS capsule, Take 50,000 Units by mouth once a week., Disp: , Rfl:    Radiology:   No results found.  Cardiac Studies:   CABG 04/29/2016:  LIMA to LAD, SVG to RCA, SVG to OM1, SVG to OM2 for left main 90% stenosis 50% stenosis in circumflex and 60% stenosis in OM1, 50% stenosis in proximal RCA.   Left Heart Catheterization 09/23/21:  RCA diffuse in the proximal and mid RCA with ulceration and scattered 80-99% stenosis.  Successful PCI to ulcerated high grade native RCA with implantation of 3.0x48 mm Synergy SD in the proximal to mid RCA. Timi 3 to 3 flow. LM diffuse 80% stenosis.  LAD patent with mild disease and LIMA to LAD patent.  CX: Moderate OM1 widely patent. Cx severely diseased after origin of OM 1 with 90-95% stenosis.  Small diseased OM-2 RI: Small with ostial mild disease.  SVG to OM1-2 skip graft occluded, SVG to RCA occluded.  LV: EF 35-40% with inferior akinesis. No MR.  Hemodynamics: LV 112/18, EDP 25 mm Hg. Ao 114/80, mean 112 mm Hg. No pressure gradient across the AV.      PCV ECHOCARDIOGRAM COMPLETE 11/23/2021 Left ventricle cavity is normal in size and wall thickness. Abnormal  septal wall motion due to post-operative coronary artery bypass graft. Normal LV systolic function with EF 61%. Doppler evidence of grade I (impaired) diastolic dysfunction, normal LAP. No significant valvular abnormality. Normal right atrial pressure.  Compared to the study 09/23/2021, LVEF 45%, PASP 50 mm Hg, Mod-severe posteriorly directed MR not present.    Carotid artery duplex 06/17/2022: Duplex suggests stenosis in the right internal carotid artery (minimal). Duplex suggests stenosis in the right external carotid artery (<50%). Duplex suggests stenosis in the left internal carotid artery (1-15%). Duplex suggests stenosis in the left external carotid artery (<50%). There is mild heterogeneous plaque noted in the carotid arteries  Antegrade right vertebral artery flow. Antegrade left vertebral artery flow. Follow up appropriate if clinically indicated.  EKG:   EKG 11/11/2022: Normal sinus rhythm at rate of 69 bpm, left atrial enlargement, normal axis.  Incomplete right bundle branch block.  No evidence of ischemia.  Otherwise normal EKG.  Compared to 05/06/2022, no significant change.  EKG 09/30/2021: Atrial fibrillation with rapid ventricular response at rate of 130 bpm, normal axis, no evidence of ischemia.  Normal QT interval.  Assessment     ICD-10-CM   1. 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 Paroxysmal atrial fibrillation (HCC)  I48.0     3. Essential hypertension  I10     4. Claudication in peripheral vascular disease (HCC)  I73.9 PCV LOWER ARTERIAL (BILATERAL)    5. Pure hypercholesterolemia  E78.00 Lipid Panel With LDL/HDL Ratio    Lipoprotein A (LPA)    6. Hx of CABG  Z95.1       CHA2DS2-VASc Score is 5.  Yearly risk of stroke: 7% (F, A, HTN, DM, Vasc Dz).  Score of 1=0.6; 2=2.2; 3=3.2; 4=4.8; 5=7.2; 6=9.8; 7=>9.8) -(CHF; HTN; vasc disease DM,  Female = 1; Age <65 =0; 65-74 = 1,  >75 =2;  stroke/embolism= 2).    Medications Discontinued During This Encounter  Medication Reason   pantoprazole (PROTONIX) 40 MG tablet Patient Preference     No orders of the defined types were placed in this encounter.   Recommendations:   [name] is a 67 y.o. 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     1. Coronary artery disease of native artery of native heart with stable angina pectoris [facility]) Patient with coronary disease and occasional chest pain and stable angina pectoris, she is presently on Eliquis in view of PAF, also on Plavix in view of ACS and coronary stenting in December 2023.  She has completed the course of Plavix, however she has new symptoms of worsening symptoms of claudication, hence I will continue Plavix for now, will obtain lower EXTR arterial duplex and if depending on the study I will discontinue Plavix.  2. Paroxysmal atrial fibrillation (HCC) She is maintaining sinus rhythm.  She is tolerating anticoagulation.  External labs reviewed, CBC is normal.  3. Essential hypertension Markedly elevated blood pressure, normal blood pressures at home and also PCP in fact blood pressure at times have been 90 to 106 mmHg systolic.  No changes in the medications were done today.  I will corrected the blood pressure numbers to her home numbers to reflect this change.  4. Claudication in peripheral vascular disease ([place]) Patient has absent popliteal pulse and pedal pulses, she now has significant claudication and has to frequently stop due to symptoms of claudication.  Unknown I will obtain  lower extremity arterial duplex.  5. Pure hypercholesterolemia She needs lipid profile testing, I will follow-up on this on her next office visit in 6 to 7 weeks.  6. Hx of CABG She is SP CABG and essentially has LIMA to LAD which is patent, RCA has a new stent that was placed in December 2023, circumflex is diffusely diseased from the mid segment onwards and to the small and OM1 is large which is patent.   Adrian Prows, MD, [facility] 11/11/2022, 9:41 AM Office: [phone] Fax: [phone] Pager: [phone]

## 2022-11-16 ENCOUNTER — Ambulatory Visit: Payer: PPO

## 2022-11-16 DIAGNOSIS — I739 Peripheral vascular disease, unspecified: Secondary | ICD-10-CM

## 2022-11-20 LAB — LIPID PANEL WITH LDL/HDL RATIO
Cholesterol, Total: 192 mg/dL (ref 100–199)
HDL: 77 mg/dL (ref >39)
LDL Chol Calc (NIH): 98 mg/dL (ref 0–99)
LDL/HDL Ratio: 1.3 ratio (ref 0.0–3.2)
Triglycerides: 96 mg/dL (ref 0–149)
VLDL Cholesterol Cal: 17 mg/dL (ref 5–40)

## 2022-11-20 LAB — LIPOPROTEIN A (LPA): Lipoprotein (a): 10 nmol/L (ref <75.0)

## 2022-11-23 ENCOUNTER — Ambulatory Visit: Payer: PPO | Admitting: Pulmonary Disease

## 2022-11-23 ENCOUNTER — Encounter: Payer: Self-pay | Admitting: Pulmonary Disease

## 2022-11-23 VITALS — BP 118/70 | HR 73 | Ht 63.0 in | Wt 122.4 lb

## 2022-11-23 DIAGNOSIS — G4734 Idiopathic sleep related nonobstructive alveolar hypoventilation: Secondary | ICD-10-CM

## 2022-11-23 DIAGNOSIS — F17210 Nicotine dependence, cigarettes, uncomplicated: Secondary | ICD-10-CM

## 2022-11-23 DIAGNOSIS — J4489 Other specified chronic obstructive pulmonary disease: Secondary | ICD-10-CM | POA: Diagnosis not present

## 2022-11-23 MED ORDER — BREZTRI AEROSPHERE 160-9-4.8 MCG/ACT IN AERO: 2.0000 | INHALATION_SPRAY | Freq: Two times a day (BID) | RESPIRATORY_TRACT | 6 refills | Status: AC

## 2022-11-23 MED ORDER — BREZTRI AEROSPHERE 160-9-4.8 MCG/ACT IN AERO: 2.0000 | INHALATION_SPRAY | Freq: Two times a day (BID) | RESPIRATORY_TRACT | 6 refills | Status: DC

## 2022-11-23 NOTE — Patient Instructions (Addendum)
Continue breztri 2 puffs daily - rinse mouth out after each use  Use albuterol inhaler as needed   Start allegra (fexofenadine) 180mg  daily for seasonal allergies  Recommend quitting smoking with mini nicotine lozenges as needed for cravings.   Schedule a pulmonary function test on your way out today  Follow up in 1 year

## 2022-11-23 NOTE — Progress Notes (Signed)
Synopsis: Referred in February 2023 for Hospital Follow up   Subjective:   PATIENT ID: [name] GENDER: female DOB: 07/22/1956, MRN: [id number]  HPI  Chief Complaint  Patient presents with   Follow-up    Pt f/u she states that she is feeling well breathing is good; she is still using 3L O2 at night or when she is resting but is needing a prescription per the DME.    [name] is a 67 year old woman, daily smoker with COPD. DMII, GERD and hypertension who returns to pulmonary clinic for COPD.  She has been using breztri 2 puffs twice per day and feeling better with it. She continues to have intermittent dyspnea. She is using 3L of O2 at night.   Review cardiology note from 11/11/22.  She is smoking less than 5 cigarettes per day. She was smoking 1 ppd for 50 years.   Initial OV 11/20/21 She was admitted 12/7 - 12/16 for STEMI s/p PCI to RCA, atrial fibrillation with RVR, and acute respiratory failure in setting of COPD exacerbation and pneumonia. Echo demonstarted moderate to severe mitral regurgitation concerning for papillary muscle dysfunction secondary to ischemia.   She was discharged home on 1L of home O2 and stiolto inhaler with as needed albuterol inhaler. Stopped using oxygen during day 1 week ago due to a sore in her nose. She continues to use O2 at night when sleeping  She has issues with allergies/molds so depending on the season she has harder time with her breathing. Perfumes can trigger her breathing. She avoids aerosol products, as these have bothered her breathing since childhood. She was treated for asthma when she was younger.   She has not smoked since the hospital admission. She is on 7mg  nicotine patches as needed and using nicotine lozenges as needed. She [place] with her husband on a [place] farm. Her physical activity is limited due to sciatica symptoms.  Past Medical History:  Diagnosis Date   Acute diverticulitis 03/30/2020   Acute ST elevation  myocardial infarction (STEMI) involving other coronary artery of inferior wall (HCC) 09/23/2021   Acute ST elevation myocardial infarction (STEMI) of inferior wall (HCC) 09/22/2021   Asthma    Benign essential HTN    IBS (irritable bowel syndrome)    Migraine headache      Family History  Problem Relation Age of Onset   Hypertension Mother    Cancer - Other Father        Oropharyngeal SCCa   CAD Brother        Deceased on MI age 42     Social History   Socioeconomic History   Marital status: Married    Spouse name: Not on file   Number of children: Not on file   Years of education: Not on file   Highest education level: Not on file  Occupational History   Not on file  Tobacco Use   Smoking status: Some Days    Packs/day: 1.00    Types: Cigarettes   Smokeless tobacco: Never   Tobacco comments:    >5 cigarettes per day  Vaping Use   Vaping Use: Never used  Substance and Sexual Activity   Alcohol use: Yes    Alcohol/week: 0.0 standard drinks of alcohol    Comment: Occasional to Rare   Drug use: No   Sexual activity: Not on file  Other Topics Concern   Not on file  Social History Narrative   Not on file  Social Determinants of Health   Financial Resource Strain: Not on file  Food Insecurity: Not on file  Transportation Needs: Not on file  Physical Activity: Not on file  Stress: Not on file  Social Connections: Not on file  Intimate Partner Violence: Not on file     Allergies  Allergen Reactions   Cefepime Anaphylaxis, Shortness Of Breath, Swelling and Dermatitis   Edoxaban Other (See Comments)    Causes hematomas   Penicillins Anaphylaxis   Barium Iodide Rash   Other     Other reaction(s): Other Preservatives  (C-Diff) Other reaction(s): Unknown Preservatives  (C-Diff) Other reaction(s): OTHER   Acetaminophen     Will not take after Cyanide deaths, something changed when they mkt'd again.  Can take Childrens Liquid but sometimes upset stomach too    Albuterol Hypertension    ABLE TO TOLERATE IN INHALER FORM NOT NEBULIZER    Aspirin     Makes anemic   Beta Adrenergic Blockers     Paradoxical reactions   Diltiazem Other (See Comments)    Patient is intolerant to all CALCIUM CHANNEL BLOCKERS, causes her blood pressure to rise.    Lisinopril     Can only take her own mfr.     Oxycodone Other (See Comments)   Prednisone     REACTION: ibs probs   Propofol     REACTION: FEVER   Repatha [Evolocumab] Other (See Comments)    Knocked her out for 10 days   Statins     GI issues   Sulfamethoxazole-Trimethoprim     REACTION: antibiotic induced colitis   Triamcinolone Other (See Comments)   Milk (Cow) Other (See Comments)     Outpatient Medications Prior to Visit  Medication Sig Dispense Refill   albuterol (PROVENTIL HFA;VENTOLIN HFA) 108 (90 BASE) MCG/ACT inhaler Inhale 2 puffs into the lungs 3 (three) times daily as needed for wheezing or shortness of breath.     apixaban (ELIQUIS) 5 MG TABS tablet Take 1 tablet (5 mg total) by mouth 2 (two) times daily. 180 tablet 3   atorvastatin (LIPITOR) 80 MG tablet Take 80 mg by mouth daily.     Budeson-Glycopyrrol-Formoterol (BREZTRI AEROSPHERE) 160-9-4.8 MCG/ACT AERO Inhale into the lungs.     clopidogrel (PLAVIX) 75 MG tablet Take 1 tablet (75 mg total) by mouth daily. 90 tablet 3   Cyanocobalamin 2000 MCG/ML SOLN Inject as directed.     dapagliflozin propanediol (FARXIGA) 10 MG TABS tablet Take 1 tablet (10 mg total) by mouth daily. 90 tablet 3   diltiazem (CARDIZEM CD) 120 MG 24 hr capsule Take 1 capsule (120 mg total) by mouth daily. 90 capsule 3   furosemide (LASIX) 20 MG tablet Take 1 tablet (20 mg total) by mouth daily. (Patient taking differently: Take 20 mg by mouth as needed.) 90 tablet 3   metoprolol tartrate (LOPRESSOR) 50 MG tablet Take 1 tablet (50 mg total) by mouth 2 (two) times daily. 180 tablet 3   nitroGLYCERIN (NITROSTAT) 0.4 MG SL tablet Place 0.4 mg under the tongue every  5 (five) minutes as needed for chest pain.     potassium chloride (KLOR-CON) 10 MEQ tablet Take 20 mEq by mouth daily.     pregabalin (LYRICA) 25 MG capsule Take 25 mg by mouth 2 (two) times daily.     promethazine (PHENERGAN) 25 MG suppository Place 25 mg rectally daily as needed for nausea.     Vitamin D, Ergocalciferol, (DRISDOL) 1.25 MG (50000 UNIT) CAPS capsule Take  50,000 Units by mouth once a week.     No facility-administered medications prior to visit.   Review of Systems  Constitutional:  Negative for chills, fever, malaise/fatigue and weight loss.  HENT:  Negative for congestion, sinus pain and sore throat.   Eyes: Negative.   Respiratory:  Positive for shortness of breath. Negative for cough, hemoptysis, sputum production and wheezing.   Cardiovascular:  Negative for chest pain, palpitations, orthopnea, claudication and leg swelling.  Gastrointestinal:  Negative for abdominal pain, heartburn, nausea and vomiting.  Genitourinary: Negative.   Musculoskeletal:  Negative for joint pain and myalgias.  Skin:  Negative for rash.  Neurological:  Negative for weakness.  Endo/Heme/Allergies: Negative.   Psychiatric/Behavioral: Negative.      Objective:   Vitals:   11/23/22 0919  BP: 118/70  Pulse: 73  SpO2: 97%  Weight: 122 lb 6.4 oz (55.5 kg)  Height: 5\' 3"  (1.6 m)   Physical Exam Constitutional:      General: She is not in acute distress.    Appearance: She is not ill-appearing.  HENT:     Head: Normocephalic and atraumatic.  Eyes:     General: No scleral icterus.    Conjunctiva/sclera: Conjunctivae normal.  Cardiovascular:     Rate and Rhythm: Normal rate and regular rhythm.     Pulses: Normal pulses.     Heart sounds: Normal heart sounds. No murmur heard. Pulmonary:     Effort: Pulmonary effort is normal.     Breath sounds: Normal breath sounds. Decreased air movement present. No wheezing, rhonchi or rales.  Musculoskeletal:     Right lower leg: No edema.      Left lower leg: No edema.  Skin:    General: Skin is warm and dry.  Neurological:     General: No focal deficit present.     Mental Status: She is alert.     CBC    Component Value Date/Time   WBC 9.4 10/28/2022 1340   WBC 11.6 (H) 04/19/2022 0730   RBC 3.85 (L) 10/28/2022 1340   HGB 11.8 (L) 10/28/2022 1340   HGB 12.3 04/16/2022 1411   HCT 35.8 (L) 10/28/2022 1340   HCT 36.9 04/16/2022 1411   PLT 284 10/28/2022 1340   MCV 93.0 10/28/2022 1340   MCH 30.6 10/28/2022 1340   MCHC 33.0 10/28/2022 1340   RDW 15.7 (H) 10/28/2022 1340   LYMPHSABS 2.2 10/28/2022 1340   MONOABS 0.7 10/28/2022 1340   EOSABS 0.1 10/28/2022 1340   BASOSABS 0.1 10/28/2022 1340      Latest Ref Rng & Units 10/28/2022    1:40 PM 03/23/2022    9:05 AM 12/21/2021    9:48 AM  BMP  Glucose 70 - 99 mg/dL 157  120  115   BUN 8 - 23 mg/dL 13  12  10    Creatinine 0.44 - 1.00 mg/dL 0.92  0.98  0.81   Sodium 135 - 145 mmol/L 137  138  138   Potassium 3.5 - 5.1 mmol/L 4.6  4.4  3.8   Chloride 98 - 111 mmol/L 102  103  104   CO2 22 - 32 mmol/L 32  30  28   Calcium 8.9 - 10.3 mg/dL 8.8  9.6  9.6    Chest imaging: CTA Chest 10/01/21 1. No evidence of pulmonary embolism. 2. Cardiomegaly with small bilateral pleural effusions lying dependently and some passive subsegmental atelectasis in the lower lobes of the lungs bilaterally. 3. Patchy areas of ground-glass  attenuation in the lungs, most evident in the left upper lobe near the apex. These may be of infectious or inflammatory etiology. 4. Aortic atherosclerosis, in addition to three-vessel coronary artery disease. Status post median sternotomy for CABG including LIMA to the LAD. 5. Moderate-sized hiatal hernia. 6. Additional incidental findings, as above.  PFT:     No data to display          Labs:  Path:  Echo 09/23/21: LV EF 45-50%. Grade III diastolic dysfunction. RV size and function is normal. RVSP 50.39mmHg. Moderate to moderately severe  posteriorly directed MR.   Heart Catheterization:  Assessment & Plan:   Asthma-COPD overlap syndrome  Nocturnal hypoxemia  Cigarette smoker - Plan: Ambulatory Referral for Lung Cancer Scre  Discussion: [name] is a 67 year old woman, daily smoker with COPD. DMII, GERD and hypertension who returns to pulmonary clinic for asthma-COPD overlap syndrome.   She is to continue on breztri 2 puffs twice daily. Continue to use albuterol inhaler as needed.   Recommend quitting smoking with mini nicotine lozenges as needed for cravings.   Recommend starting allegra daily for spring allergies.   Refer to lung cancer screening program.   She is to continue 2L O2 at night.  Follow up in 1 year.   Melody Comas, MD [facility] Office: [phone]   Current Outpatient Medications:    albuterol (PROVENTIL HFA;VENTOLIN HFA) 108 (90 BASE) MCG/ACT inhaler, Inhale 2 puffs into the lungs 3 (three) times daily as needed for wheezing or shortness of breath., Disp: , Rfl:    apixaban (ELIQUIS) 5 MG TABS tablet, Take 1 tablet (5 mg total) by mouth 2 (two) times daily., Disp: 180 tablet, Rfl: 3   atorvastatin (LIPITOR) 80 MG tablet, Take 80 mg by mouth daily., Disp: , Rfl:    Budeson-Glycopyrrol-Formoterol (BREZTRI AEROSPHERE) 160-9-4.8 MCG/ACT AERO, Inhale into the lungs., Disp: , Rfl:    clopidogrel (PLAVIX) 75 MG tablet, Take 1 tablet (75 mg total) by mouth daily., Disp: 90 tablet, Rfl: 3   Cyanocobalamin 2000 MCG/ML SOLN, Inject as directed., Disp: , Rfl:    dapagliflozin propanediol (FARXIGA) 10 MG TABS tablet, Take 1 tablet (10 mg total) by mouth daily., Disp: 90 tablet, Rfl: 3   diltiazem (CARDIZEM CD) 120 MG 24 hr capsule, Take 1 capsule (120 mg total) by mouth daily., Disp: 90 capsule, Rfl: 3   furosemide (LASIX) 20 MG tablet, Take 1 tablet (20 mg total) by mouth daily. (Patient taking differently: Take 20 mg by mouth as needed.), Disp: 90 tablet, Rfl: 3    metoprolol tartrate (LOPRESSOR) 50 MG tablet, Take 1 tablet (50 mg total) by mouth 2 (two) times daily., Disp: 180 tablet, Rfl: 3   nitroGLYCERIN (NITROSTAT) 0.4 MG SL tablet, Place 0.4 mg under the tongue every 5 (five) minutes as needed for chest pain., Disp: , Rfl:    potassium chloride (KLOR-CON) 10 MEQ tablet, Take 20 mEq by mouth daily., Disp: , Rfl:    pregabalin (LYRICA) 25 MG capsule, Take 25 mg by mouth 2 (two) times daily., Disp: , Rfl:    promethazine (PHENERGAN) 25 MG suppository, Place 25 mg rectally daily as needed for nausea., Disp: , Rfl:    Vitamin D, Ergocalciferol, (DRISDOL) 1.25 MG (50000 UNIT) CAPS capsule, Take 50,000 Units by mouth once a week., Disp: , Rfl:    Budeson-Glycopyrrol-Formoterol (BREZTRI AEROSPHERE) 160-9-4.8 MCG/ACT AERO, Inhale 2 puffs into the lungs in the morning and at bedtime., Disp: 10.7 g, Rfl:  6   

## 2022-11-24 ENCOUNTER — Other Ambulatory Visit: Payer: Self-pay | Admitting: *Deleted

## 2022-11-24 DIAGNOSIS — J4489 Other specified chronic obstructive pulmonary disease: Secondary | ICD-10-CM

## 2022-11-25 ENCOUNTER — Ambulatory Visit (INDEPENDENT_AMBULATORY_CARE_PROVIDER_SITE_OTHER): Payer: PPO | Admitting: Pulmonary Disease

## 2022-11-25 DIAGNOSIS — J4489 Other specified chronic obstructive pulmonary disease: Secondary | ICD-10-CM

## 2022-11-25 LAB — PULMONARY FUNCTION TEST
DL/VA % pred: 65 %
DL/VA: 2.75 ml/min/mmHg/L
DLCO cor % pred: 52 %
DLCO cor: 10.07 ml/min/mmHg
DLCO unc % pred: 49 %
DLCO unc: 9.54 ml/min/mmHg
FEF 25-75 Post: 0.39 L/s
FEF 25-75 Pre: 0.39 L/s
FEF2575-%Change-Post: 2 %
FEF2575-%Pred-Post: 19 %
FEF2575-%Pred-Pre: 18 %
FEV1-%Change-Post: 0 %
FEV1-%Pred-Post: 42 %
FEV1-%Pred-Pre: 42 %
FEV1-Post: 0.98 L
FEV1-Pre: 0.98 L
FEV1FVC-%Change-Post: 2 %
FEV1FVC-%Pred-Pre: 63 %
FEV6-%Change-Post: 0 %
FEV6-%Pred-Post: 66 %
FEV6-%Pred-Pre: 66 %
FEV6-Post: 1.94 L
FEV6-Pre: 1.93 L
FEV6FVC-%Change-Post: 2 %
FEV6FVC-%Pred-Post: 102 %
FEV6FVC-%Pred-Pre: 99 %
FVC-%Change-Post: -1 %
FVC-%Pred-Post: 65 %
FVC-%Pred-Pre: 66 %
FVC-Post: 1.98 L
FVC-Pre: 2.01 L
Post FEV1/FVC ratio: 50 %
Post FEV6/FVC ratio: 98 %
Pre FEV1/FVC ratio: 49 %
Pre FEV6/FVC Ratio: 96 %
RV % pred: 121 %
RV: 2.47 L
TLC % pred: 98 %
TLC: 4.83 L

## 2022-11-25 NOTE — Progress Notes (Signed)
Full PFT performed today. °

## 2022-11-25 NOTE — Patient Instructions (Signed)
Full PFT performed today. °

## 2022-12-21 ENCOUNTER — Ambulatory Visit: Payer: PPO | Admitting: Cardiology

## 2022-12-29 ENCOUNTER — Telehealth: Payer: Self-pay | Admitting: Pulmonary Disease

## 2022-12-29 NOTE — Telephone Encounter (Signed)
Jeanne Ivan states sent fax for office notes for patient's oxygen. Jeanne Ivan phone number is [phone]. Fax number is [phone].

## 2022-12-30 NOTE — Telephone Encounter (Signed)
Spoke with [employer] who is requesting most [facility] OV notes which were faxed to them at fax number provided. Nothing further needed at this time.

## 2023-01-04 ENCOUNTER — Ambulatory Visit: Payer: PPO | Admitting: Cardiology

## 2023-01-04 ENCOUNTER — Encounter: Payer: Self-pay | Admitting: Cardiology

## 2023-01-04 VITALS — BP 142/64 | HR 74 | Resp 15 | Ht 63.0 in | Wt 119.0 lb

## 2023-01-04 DIAGNOSIS — R7989 Other specified abnormal findings of blood chemistry: Secondary | ICD-10-CM

## 2023-01-04 DIAGNOSIS — I739 Peripheral vascular disease, unspecified: Secondary | ICD-10-CM

## 2023-01-04 DIAGNOSIS — I480 Paroxysmal atrial fibrillation: Secondary | ICD-10-CM

## 2023-01-04 DIAGNOSIS — I10 Essential (primary) hypertension: Secondary | ICD-10-CM

## 2023-01-04 DIAGNOSIS — E7800 Pure hypercholesterolemia, unspecified: Secondary | ICD-10-CM

## 2023-01-04 MED ORDER — LOSARTAN POTASSIUM 50 MG PO TABS: 50.0000 mg | ORAL_TABLET | Freq: Every day | ORAL | 2 refills | Status: DC

## 2023-01-04 MED ORDER — EZETIMIBE 10 MG PO TABS: 10.0000 mg | ORAL_TABLET | Freq: Every day | ORAL | 3 refills | Status: DC

## 2023-01-04 NOTE — H&P (View-Only) (Signed)
 Primary Physician/Referring:  Routh, Sarah T, NP  Patient ID: [name], female    DOB: 10/28/1955, 67 y.o.   MRN: [id number]  Chief Complaint  Patient presents with   Coronary Artery Disease   Hyperlipidemia   Hypertension   Follow-up    6 week   HPI:    [name]  is a 67 y.o. 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    Patient states she has noticed significant cramping in her calves when she walks and has to stop frequently.  She monitors her blood pressure daily at home with average readings reported to be 110/70 mmHg in fact her blood pressure had been low and PCP tried to reduce the dose of the metoprolol but she developed palpitations and she is back on metoprolol.     Denies chest pain, change in dyspnea, orthopnea, PND, leg edema.  Past Medical History:  Diagnosis Date   Acute diverticulitis 03/30/2020   Acute ST elevation myocardial infarction (STEMI) involving other coronary artery of inferior wall (HCC) 09/23/2021   Acute ST elevation myocardial infarction (STEMI) of inferior wall (HCC) 09/22/2021   Asthma    Benign essential HTN    IBS (irritable bowel syndrome)    Migraine headache    Past Surgical History:  Procedure Laterality Date   CORONARY/GRAFT ACUTE MI REVASCULARIZATION N/A 09/22/2021   Procedure: Coronary/Graft Acute MI Revascularization;  Surgeon: Ruvim Risko, MD;  Location: [facility];  Service: Cardiovascular;  Laterality: N/A;   LEFT HEART CATH AND CORONARY ANGIOGRAPHY N/A 09/22/2021    Procedure: LEFT HEART CATH AND CORONARY ANGIOGRAPHY;  Surgeon: Sarika Baldini, MD;  Location: [facility];  Service: Cardiovascular;  Laterality: N/A;   REVERSE SHOULDER ARTHROPLASTY Left 09/16/2021   Procedure: REVERSE SHOULDER ARTHROPLASTY;  Surgeon: Norris, Steve, MD;  Location: [facility];  Service: Orthopedics;  Laterality: Left;   Family History  Problem Relation Age of Onset   Hypertension Mother    Cancer - Other Father        Oropharyngeal SCCa   CAD Brother        Deceased on MI age 61    Social History   Tobacco Use   Smoking status: Some Days    Packs/day: 1    Types: Cigarettes   Smokeless tobacco: Never   Tobacco comments:    >5 cigarettes per day  Substance Use Topics   Alcohol use: Yes    Alcohol/week: 0.0 standard drinks of alcohol    Comment: Occasional to Rare   Marital Status: Married   ROS  Review of Systems  Cardiovascular:  Positive for claudication. Negative for chest pain, dyspnea on exertion and leg swelling.   Objective  Blood pressure (!) 142/64, pulse 74, resp. rate 15, height 5' 3" (1.6 m), weight 119 lb (54 kg), SpO2 97 %.     01/04/2023    9:52 AM 11/23/2022    9:19 AM 11/11/2022    9:38 AM  Vitals with BMI  Height 5' 3" 5' 3"   Weight 119 lbs 122 lbs 6 oz   BMI   21.09 21.69   Systolic 142 118 120  Diastolic 64 70 77  Pulse 74 73       Physical Exam Neck:     Vascular: Carotid bruit (bilateral) present. No JVD.  Cardiovascular:     Rate and Rhythm: Normal rate and regular rhythm.     Pulses:          Dorsalis pedis pulses are 0 on the right side and 0 on the left side.       Posterior tibial pulses are 0 on the right side and 0 on the left side.     Heart sounds: Heart sounds are distant. No murmur heard.    No gallop.  Pulmonary:     Effort: Pulmonary effort is normal.     Breath sounds: Decreased air movement present. Examination of the right-lower field reveals decreased breath sounds. Examination of the left-lower field reveals  decreased breath sounds. Decreased breath sounds present.  Abdominal:     General: Bowel sounds are normal.     Palpations: Abdomen is soft.  Musculoskeletal:     Right lower leg: No edema.     Left lower leg: No edema.    Laboratory examination:   Recent Labs    03/23/22 0905 10/28/22 1340  NA 138 137  K 4.4 4.6  CL 103 102  CO2 30 32  GLUCOSE 120* 157*  BUN 12 13  CREATININE 0.98 0.92  CALCIUM 9.6 8.8*  GFRNONAA >60 >60      Latest Ref Rng & Units 10/28/2022    1:40 PM 03/23/2022    9:05 AM 12/21/2021    9:48 AM  CMP  Glucose 70 - 99 mg/dL 157  120  115   BUN 8 - 23 mg/dL 13  12  10   Creatinine 0.44 - 1.00 mg/dL 0.92  0.98  0.81   Sodium 135 - 145 mmol/L 137  138  138   Potassium 3.5 - 5.1 mmol/L 4.6  4.4  3.8   Chloride 98 - 111 mmol/L 102  103  104   CO2 22 - 32 mmol/L 32  30  28   Calcium 8.9 - 10.3 mg/dL 8.8  9.6  9.6   Total Protein 6.5 - 8.1 g/dL 5.9  7.0  7.3   Total Bilirubin 0.3 - 1.2 mg/dL 0.2  0.4  0.4   Alkaline Phos 38 - 126 U/L 61  83  92   AST 15 - 41 U/L 13  13  11   ALT 0 - 44 U/L 9  20  7       Latest Ref Rng & Units 10/28/2022    1:40 PM 04/19/2022    7:30 AM 04/16/2022    2:11 PM  CBC  WBC 4.0 - 10.5 K/uL 9.4  11.6    Hemoglobin 12.0 - 15.0 g/dL 11.8  12.3  12.3   Hematocrit 36.0 - 46.0 % 35.8  38.1  36.9   Platelets 150 - 400 K/uL 284  285     Lab Results  Component Value Date   CHOL 192 11/19/2022   HDL 77 11/19/2022   LDLCALC 98 11/19/2022   LDLDIRECT 173.8 10/24/2009   TRIG 96 11/19/2022   CHOLHDL 6.0 09/24/2021    HEMOGLOBIN A1C Lab Results  Component Value Date   HGBA1C 6.7 (H) 09/17/2021   MPG 146 09/17/2021   Lab Results  Component Value Date   TSH 0.177 (L) 09/24/2021    External labs:  None     Allergies   Allergies  Allergen Reactions   Cefepime Anaphylaxis, Shortness Of Breath, Swelling and Dermatitis   Edoxaban Other (See Comments)    Causes hematomas   Penicillins Anaphylaxis   Barium Iodide Rash   Other      Other reaction(s): Other Preservatives  (C-Diff) Other reaction(s): Unknown Preservatives  (C-Diff) Other reaction(s): OTHER   Acetaminophen     Will not take after Cyanide deaths, something changed when they mkt'd again.  Can take Childrens Liquid but sometimes upset stomach too   Albuterol Hypertension    ABLE TO TOLERATE IN INHALER FORM NOT NEBULIZER    Aspirin     Makes anemic   Beta Adrenergic Blockers     Paradoxical reactions   Oxycodone Other (See Comments)   Prednisone     REACTION: ibs probs   Propofol     REACTION: FEVER   Repatha [Evolocumab] Other (See Comments)    Knocked her out for 10 days   Statins     GI issues   Sulfamethoxazole-Trimethoprim     REACTION: antibiotic induced colitis   Triamcinolone Other (See Comments)   Milk (Cow) Other (See Comments)     Final Medications at End of Visit     Current Outpatient Medications:    albuterol (PROVENTIL HFA;VENTOLIN HFA) 108 (90 BASE) MCG/ACT inhaler, Inhale 2 puffs into the lungs 3 (three) times daily as needed for wheezing or shortness of breath., Disp: , Rfl:    apixaban (ELIQUIS) 5 MG TABS tablet, Take 1 tablet (5 mg total) by mouth 2 (two) times daily., Disp: 180 tablet, Rfl: 3   atorvastatin (LIPITOR) 80 MG tablet, Take 80 mg by mouth daily., Disp: , Rfl:    Budeson-Glycopyrrol-Formoterol (BREZTRI AEROSPHERE) 160-9-4.8 MCG/ACT AERO, Inhale into the lungs., Disp: , Rfl:    Budeson-Glycopyrrol-Formoterol (BREZTRI AEROSPHERE) 160-9-4.8 MCG/ACT AERO, Inhale 2 puffs into the lungs in the morning and at bedtime., Disp: 10.7 g, Rfl: 6   clopidogrel (PLAVIX) 75 MG tablet, Take 1 tablet (75 mg total) by mouth daily., Disp: 90 tablet, Rfl: 3   Cyanocobalamin 2000 MCG/ML SOLN, Inject as directed., Disp: , Rfl:    dapagliflozin propanediol (FARXIGA) 10 MG TABS tablet, Take 1 tablet (10 mg total) by mouth daily., Disp: 90 tablet, Rfl: 3   diltiazem (CARDIZEM CD) 120 MG 24 hr capsule, Take 1 capsule (120 mg total) by  mouth daily., Disp: 90 capsule, Rfl: 3   ezetimibe (ZETIA) 10 MG tablet, Take 1 tablet (10 mg total) by mouth daily., Disp: 90 tablet, Rfl: 3   furosemide (LASIX) 20 MG tablet, Take 1 tablet (20 mg total) by mouth daily. (Patient taking differently: Take 20 mg by mouth as needed.), Disp: 90 tablet, Rfl: 3   losartan (COZAAR) 50 MG tablet, Take 1 tablet (50 mg total) by mouth daily., Disp: 30 tablet, Rfl: 2   magnesium 30 MG tablet, Take 30 mg by mouth 2 (two) times daily., Disp: , Rfl:    metoprolol tartrate (LOPRESSOR) 50 MG tablet, Take 1 tablet (50 mg total) by mouth 2 (two) times daily., Disp: 180 tablet, Rfl: 3   nitroGLYCERIN (NITROSTAT) 0.4 MG SL tablet, Place 0.4 mg under the tongue every 5 (five) minutes as needed for chest pain., Disp: , Rfl:    potassium chloride (KLOR-CON) 10 MEQ tablet, Take 20 mEq by mouth daily as needed (With furosemide)., Disp: , Rfl:    pregabalin (LYRICA) 50 MG capsule, Take 50 mg by mouth 3 (three) times daily., Disp: , Rfl:      promethazine (PHENERGAN) 25 MG suppository, Place 25 mg rectally daily as needed for nausea., Disp: , Rfl:    Vitamin D, Ergocalciferol, (DRISDOL) 1.25 MG (50000 UNIT) CAPS capsule, Take 50,000 Units by mouth once a week., Disp: , Rfl:    Radiology:   No results found.  Cardiac Studies:   CABG 04/29/2016: LIMA to LAD, SVG to RCA, SVG to OM1, SVG to OM2 for left main 90% stenosis 50% stenosis in circumflex and 60% stenosis in OM1, 50% stenosis in proximal RCA.   Left Heart Catheterization 09/23/21:  RCA diffuse in the proximal and mid RCA with ulceration and scattered 80-99% stenosis.  Successful PCI to ulcerated high grade native RCA with implantation of 3.0x48 mm Synergy SD in the proximal to mid RCA. Timi 3 to 3 flow. LM diffuse 80% stenosis.  LAD patent with mild disease and LIMA to LAD patent.  CX: Moderate OM1 widely patent. Cx severely diseased after origin of OM 1 with 90-95% stenosis.  Small diseased OM-2 RI: Small with  ostial mild disease.  SVG to OM1-2 skip graft occluded, SVG to RCA occluded.  LV: EF 35-40% with inferior akinesis. No MR.  Hemodynamics: LV 112/18, EDP 25 mm Hg. Ao 114/80, mean 112 mm Hg. No pressure gradient across the AV.      PCV ECHOCARDIOGRAM COMPLETE 11/23/2021 Left ventricle cavity is normal in size and wall thickness. Abnormal septal wall motion due to post-operative coronary artery bypass graft. Normal LV systolic function with EF 61%. Doppler evidence of grade I (impaired) diastolic dysfunction, normal LAP. No significant valvular abnormality. Normal right atrial pressure.  Compared to the study 09/23/2021, LVEF 45%, PASP 50 mm Hg, Mod-severe posteriorly directed MR not present.    Carotid artery duplex 06/17/2022: Duplex suggests stenosis in the right internal carotid artery (minimal). Duplex suggests stenosis in the right external carotid artery (<50%). Duplex suggests stenosis in the left internal carotid artery (1-15%). Duplex suggests stenosis in the left external carotid artery (<50%). There is mild heterogeneous plaque noted in the carotid arteries  Antegrade right vertebral artery flow. Antegrade left vertebral artery flow. Follow up appropriate if clinically indicated.  Lower Extremity Arterial Duplex 11/16/2022: There is diffuse plaque noted in the proximal to distal right SFA with dampened monophasic waveform pattern suggesting significant stenosis. Very mild calcification noted.  There is monophasic waveform pattern below right knee suggesting proximal stenosis.  There is diffuse plaque noted in the proximal to distal left SFA with dampened monophasic waveform pattern suggesting significant stenosis. Very mild calcification noted.  There is monophasic waveform pattern below left knee suggesting proximal stenosis.  This exam reveals moderately decreased perfusion of the right lower extremity, noted at the post tibial artery level (ABI0.61) with severely abnormal  monophasic waveform pattern at the right ankle level.   This exam reveals moderately decreased perfusion of the left lower extremity, noted at the post tibial artery level (ABI 0.60) with severely abnormal monophasic waveform pattern at the right ankle level.   EKG:   EKG 11/11/2022: Normal sinus rhythm at rate of 69 bpm, left atrial enlargement, normal axis.  Incomplete right bundle branch block.  No evidence of ischemia.  Otherwise normal EKG.  Compared to 05/06/2022, no significant change.  EKG 09/30/2021: Atrial fibrillation with rapid ventricular response at rate of 130 bpm, normal axis, no evidence of ischemia.  Normal QT interval.  Assessment     ICD-10-CM   1. Claudication in peripheral vascular disease (HCC)  I73.9 losartan (COZAAR) 50 MG tablet      CBC    Basic metabolic panel    2. Paroxysmal atrial fibrillation (HCC)  I48.0     3. Essential hypertension  I10 losartan (COZAAR) 50 MG tablet    4. Abnormal TSH  R79.89 Thyroid Panel With TSH    5. Pure hypercholesterolemia  E78.00 ezetimibe (ZETIA) 10 MG tablet      CHA2DS2-VASc Score is 5.  Yearly risk of stroke: 7% (F, A, HTN, DM, Vasc Dz).  Score of 1=0.6; 2=2.2; 3=3.2; 4=4.8; 5=7.2; 6=9.8; 7=>9.8) -(CHF; HTN; vasc disease DM,  Female = 1; Age <65 =0; 65-74 = 1,  >75 =2; stroke/embolism= 2).    There are no discontinued medications.    Meds ordered this encounter  Medications   losartan (COZAAR) 50 MG tablet    Sig: Take 1 tablet (50 mg total) by mouth daily.    Dispense:  30 tablet    Refill:  2   ezetimibe (ZETIA) 10 MG tablet    Sig: Take 1 tablet (10 mg total) by mouth daily.    Dispense:  90 tablet    Refill:  3    Recommendations:   [name] is a 67 y.o. 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     1. Claudication in peripheral vascular disease (HCC) Patient has severe symptoms of claudication, lower extremity arterial duplex revealed bilateral SFA high-grade stenosis with diffuse disease.  In view of marked decrease in her physical activity, we will proceed with peripheral arteriogram and possible angioplasty.  Will schedule for peripheral arteriogram and possible angioplasty given symptoms. Patient understands the risks, benefits, alternatives including medical therapy, CT angiography. Patient understands <1-2% risk of death, embolic complications, bleeding, infection, renal failure, urgent surgical revascularization, but not limited to these and wants to proceed.   2. Paroxysmal atrial fibrillation (HCC) Patient is maintaining sinus rhythm.  She was seen in [facility] about 3 weeks ago with epistaxis and needed 2 units of blood transfusion.  I discussed with her regarding use of Sudafed 2 tablets with onset of nasal bleed and also to use Afrin and ice pack.  Presently she is on Plavix and Eliquis, she can come off of Plavix as it has been over 1 year since her stenting to the right coronary artery however she is now going for peripheral arteriogram hence we will continue this for now and until PAD is ruled out and therapy evaluated.  3. Essential hypertension Blood pressure is elevated, she is not on an ACE inhibitor or an ARB.  Lisinopril was listed as an allergy but she is not sure of what this is.  I have started her on losartan 50 mg daily.  She will need follow-up labs.  4. Abnormal TSH She has abnormal TSH and I am not sure that she has had TSH followed through.  I will check thyroid panel.  5. Pure hypercholesterolemia Lipids are not at goal, I have added Zetia 10 mg  daily.  She will need repeat lipid profile testing in a few months.  This was a 40-minute office visit encounter.  Tobacco use cessation discussed with the patient, I   given some addresses for hypnosis as well as she has tried and Wellbutrin in the past.    Wilhemina Grall, MD, FACC 01/04/2023, 10:24 AM Office: [phone] Fax: [phone] Pager: [phone]  

## 2023-01-04 NOTE — Progress Notes (Signed)
Primary Physician/Referring:  Elenore Paddy, NP  Patient ID: [name], female    DOB: 01/18/1956, 67 y.o.   MRN: [id number]  Chief Complaint  Patient presents with   Coronary Artery Disease   Hyperlipidemia   Hypertension   Follow-up    6 week   HPI:    [name]  is a 67 y.o. 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    Patient states she has noticed significant cramping in her calves when she walks and has to stop frequently.  She monitors her blood pressure daily at home with average readings reported to be 110/70 mmHg in fact her blood pressure had been low and PCP tried to reduce the dose of the metoprolol but she developed palpitations and she is back on metoprolol.     Denies chest pain, change in dyspnea, orthopnea, PND, leg edema.  Past Medical History:  Diagnosis Date   Acute diverticulitis 03/30/2020   Acute ST elevation myocardial infarction (STEMI) involving other coronary artery of inferior wall (HCC) 09/23/2021   Acute ST elevation myocardial infarction (STEMI) of inferior wall (HCC) 09/22/2021   Asthma    Benign essential HTN    IBS (irritable bowel syndrome)    Migraine headache    Past Surgical History:  Procedure Laterality Date   CORONARY/GRAFT ACUTE MI REVASCULARIZATION N/A 09/22/2021   Procedure: Coronary/Graft Acute MI Revascularization;  Surgeon: Adrian Prows, MD;  Location: [facility];  Service: Cardiovascular;  Laterality: N/A;   LEFT HEART CATH AND CORONARY ANGIOGRAPHY N/A 09/22/2021    Procedure: LEFT HEART CATH AND CORONARY ANGIOGRAPHY;  Surgeon: Adrian Prows, MD;  Location: [facility] [facility];  Service: Cardiovascular;  Laterality: N/A;   REVERSE SHOULDER ARTHROPLASTY Left 09/16/2021   Procedure: REVERSE SHOULDER ARTHROPLASTY;  Surgeon: Netta Cedars, MD;  Location: [facility];  Service: Orthopedics;  Laterality: Left;   Family History  Problem Relation Age of Onset   Hypertension Mother    Cancer - Other Father        Oropharyngeal SCCa   CAD Brother        Deceased on MI age 33    Social History   Tobacco Use   Smoking status: Some Days    Packs/day: 1    Types: Cigarettes   Smokeless tobacco: Never   Tobacco comments:    >5 cigarettes per day  Substance Use Topics   Alcohol use: Yes    Alcohol/week: 0.0 standard drinks of alcohol    Comment: Occasional to Rare   Marital Status: Married   ROS  Review of Systems  Cardiovascular:  Positive for claudication. Negative for chest pain, dyspnea on exertion and leg swelling.   Objective  Blood pressure (!) 142/64, pulse 74, resp. rate 15, height 5\' 3"  (1.6 m), weight 119 lb (54 kg), SpO2 97 %.     01/04/2023    9:52 AM 11/23/2022    9:19 AM 11/11/2022    9:38 AM  Vitals with BMI  Height 5\' 3"  5\' 3"    Weight 119 lbs 122 lbs 6 oz   BMI  [id number]   Systolic [id number]  Diastolic 64 70 77  Pulse 74 73       Physical Exam Neck:     Vascular: Carotid bruit (bilateral) present. No JVD.  Cardiovascular:     Rate and Rhythm: Normal rate and regular rhythm.     Pulses:          Dorsalis pedis pulses are 0 on the right side and 0 on the left side.       Posterior tibial pulses are 0 on the right side and 0 on the left side.     Heart sounds: Heart sounds are distant. No murmur heard.    No gallop.  Pulmonary:     Effort: Pulmonary effort is normal.     Breath sounds: Decreased air movement present. Examination of the right-lower field reveals decreased breath sounds. Examination of the left-lower field reveals  decreased breath sounds. Decreased breath sounds present.  Abdominal:     General: Bowel sounds are normal.     Palpations: Abdomen is soft.  Musculoskeletal:     Right lower leg: No edema.     Left lower leg: No edema.    Laboratory examination:   Recent Labs    03/23/22 0905 10/28/22 1340  NA 138 137  K 4.4 4.6  CL 103 102  CO2 30 32  GLUCOSE 120* 157*  BUN 12 13  CREATININE 0.98 0.92  CALCIUM 9.6 8.8*  GFRNONAA >60 >60      Latest Ref Rng & Units 10/28/2022    1:40 PM 03/23/2022    9:05 AM 12/21/2021    9:48 AM  CMP  Glucose 70 - 99 mg/dL 157  120  115   BUN 8 - 23 mg/dL 13  12  10    Creatinine 0.44 - 1.00 mg/dL 0.92  0.98  0.81   Sodium 135 - 145 mmol/L 137  138  138   Potassium 3.5 - 5.1 mmol/L 4.6  4.4  3.8   Chloride 98 - 111 mmol/L 102  103  104   CO2 22 - 32 mmol/L 32  30  28   Calcium 8.9 - 10.3 mg/dL 8.8  9.6  9.6   Total Protein 6.5 - 8.1 g/dL 5.9  7.0  7.3   Total Bilirubin 0.3 - 1.2 mg/dL 0.2  0.4  0.4   Alkaline Phos 38 - 126 U/L 61  83  92   AST 15 - 41 U/L 13  13  11    ALT 0 - 44 U/L 9  20  7        Latest Ref Rng & Units 10/28/2022    1:40 PM 04/19/2022    7:30 AM 04/16/2022    2:11 PM  CBC  WBC 4.0 - 10.5 K/uL 9.4  11.6    Hemoglobin 12.0 - 15.0 g/dL 11.8  12.3  12.3   Hematocrit 36.0 - 46.0 % 35.8  38.1  36.9   Platelets 150 - 400 K/uL 284  285     Lab Results  Component Value Date   CHOL 192 11/19/2022   HDL 77 11/19/2022   LDLCALC 98 11/19/2022   LDLDIRECT 173.8 10/24/2009   TRIG 96 11/19/2022   CHOLHDL 6.0 09/24/2021    HEMOGLOBIN A1C Lab Results  Component Value Date   HGBA1C 6.7 (H) 09/17/2021   MPG 146 09/17/2021   Lab Results  Component Value Date   TSH 0.177 (L) 09/24/2021    External labs:  None  Allergies   Allergies  Allergen Reactions   Cefepime Anaphylaxis, Shortness Of Breath, Swelling and Dermatitis   Edoxaban Other (See Comments)    Causes hematomas   Penicillins Anaphylaxis   Barium Iodide Rash   Other      Other reaction(s): Other Preservatives  (C-Diff) Other reaction(s): Unknown Preservatives  (C-Diff) Other reaction(s): OTHER   Acetaminophen     Will not take after Cyanide deaths, something changed when they mkt'd again.  Can take Childrens Liquid but sometimes upset stomach too   Albuterol Hypertension    ABLE TO TOLERATE IN INHALER FORM NOT NEBULIZER    Aspirin     Makes anemic   Beta Adrenergic Blockers     Paradoxical reactions   Oxycodone Other (See Comments)   Prednisone     REACTION: ibs probs   Propofol     REACTION: FEVER   Repatha [Evolocumab] Other (See Comments)    Knocked her out for 10 days   Statins     GI issues   Sulfamethoxazole-Trimethoprim     REACTION: antibiotic induced colitis   Triamcinolone Other (See Comments)   Milk (Cow) Other (See Comments)     Final Medications at End of Visit     Current Outpatient Medications:    albuterol (PROVENTIL HFA;VENTOLIN HFA) 108 (90 BASE) MCG/ACT inhaler, Inhale 2 puffs into the lungs 3 (three) times daily as needed for wheezing or shortness of breath., Disp: , Rfl:    apixaban (ELIQUIS) 5 MG TABS tablet, Take 1 tablet (5 mg total) by mouth 2 (two) times daily., Disp: 180 tablet, Rfl: 3   atorvastatin (LIPITOR) 80 MG tablet, Take 80 mg by mouth daily., Disp: , Rfl:    Budeson-Glycopyrrol-Formoterol (BREZTRI AEROSPHERE) 160-9-4.8 MCG/ACT AERO, Inhale into the lungs., Disp: , Rfl:    Budeson-Glycopyrrol-Formoterol (BREZTRI AEROSPHERE) 160-9-4.8 MCG/ACT AERO, Inhale 2 puffs into the lungs in the morning and at bedtime., Disp: 10.7 g, Rfl: 6   clopidogrel (PLAVIX) 75 MG tablet, Take 1 tablet (75 mg total) by mouth daily., Disp: 90 tablet, Rfl: 3   Cyanocobalamin 2000 MCG/ML SOLN, Inject as directed., Disp: , Rfl:    dapagliflozin propanediol (FARXIGA) 10 MG TABS tablet, Take 1 tablet (10 mg total) by mouth daily., Disp: 90 tablet, Rfl: 3   diltiazem (CARDIZEM CD) 120 MG 24 hr capsule, Take 1 capsule (120 mg total) by  mouth daily., Disp: 90 capsule, Rfl: 3   ezetimibe (ZETIA) 10 MG tablet, Take 1 tablet (10 mg total) by mouth daily., Disp: 90 tablet, Rfl: 3   furosemide (LASIX) 20 MG tablet, Take 1 tablet (20 mg total) by mouth daily. (Patient taking differently: Take 20 mg by mouth as needed.), Disp: 90 tablet, Rfl: 3   losartan (COZAAR) 50 MG tablet, Take 1 tablet (50 mg total) by mouth daily., Disp: 30 tablet, Rfl: 2   magnesium 30 MG tablet, Take 30 mg by mouth 2 (two) times daily., Disp: , Rfl:    metoprolol tartrate (LOPRESSOR) 50 MG tablet, Take 1 tablet (50 mg total) by mouth 2 (two) times daily., Disp: 180 tablet, Rfl: 3   nitroGLYCERIN (NITROSTAT) 0.4 MG SL tablet, Place 0.4 mg under the tongue every 5 (five) minutes as needed for chest pain., Disp: , Rfl:    potassium chloride (KLOR-CON) 10 MEQ tablet, Take 20 mEq by mouth daily as needed (With furosemide)., Disp: , Rfl:    pregabalin (LYRICA) 50 MG capsule, Take 50 mg by mouth 3 (three) times daily., Disp: , Rfl:  promethazine (PHENERGAN) 25 MG suppository, Place 25 mg rectally daily as needed for nausea., Disp: , Rfl:    Vitamin D, Ergocalciferol, (DRISDOL) 1.25 MG (50000 UNIT) CAPS capsule, Take 50,000 Units by mouth once a week., Disp: , Rfl:    Radiology:   No results found.  Cardiac Studies:   CABG 04/29/2016: LIMA to LAD, SVG to RCA, SVG to OM1, SVG to OM2 for left main 90% stenosis 50% stenosis in circumflex and 60% stenosis in OM1, 50% stenosis in proximal RCA.   Left Heart Catheterization 09/23/21:  RCA diffuse in the proximal and mid RCA with ulceration and scattered 80-99% stenosis.  Successful PCI to ulcerated high grade native RCA with implantation of 3.0x48 mm Synergy SD in the proximal to mid RCA. Timi 3 to 3 flow. LM diffuse 80% stenosis.  LAD patent with mild disease and LIMA to LAD patent.  CX: Moderate OM1 widely patent. Cx severely diseased after origin of OM 1 with 90-95% stenosis.  Small diseased OM-2 RI: Small with  ostial mild disease.  SVG to OM1-2 skip graft occluded, SVG to RCA occluded.  LV: EF 35-40% with inferior akinesis. No MR.  Hemodynamics: LV 112/18, EDP 25 mm Hg. Ao 114/80, mean 112 mm Hg. No pressure gradient across the AV.      PCV ECHOCARDIOGRAM COMPLETE 11/23/2021 Left ventricle cavity is normal in size and wall thickness. Abnormal septal wall motion due to post-operative coronary artery bypass graft. Normal LV systolic function with EF 61%. Doppler evidence of grade I (impaired) diastolic dysfunction, normal LAP. No significant valvular abnormality. Normal right atrial pressure.  Compared to the study 09/23/2021, LVEF 45%, PASP 50 mm Hg, Mod-severe posteriorly directed MR not present.    Carotid artery duplex 06/17/2022: Duplex suggests stenosis in the right internal carotid artery (minimal). Duplex suggests stenosis in the right external carotid artery (<50%). Duplex suggests stenosis in the left internal carotid artery (1-15%). Duplex suggests stenosis in the left external carotid artery (<50%). There is mild heterogeneous plaque noted in the carotid arteries  Antegrade right vertebral artery flow. Antegrade left vertebral artery flow. Follow up appropriate if clinically indicated.  Lower Extremity Arterial Duplex 11/16/2022: There is diffuse plaque noted in the proximal to distal right SFA with dampened monophasic waveform pattern suggesting significant stenosis. Very mild calcification noted.  There is monophasic waveform pattern below right knee suggesting proximal stenosis.  There is diffuse plaque noted in the proximal to distal left SFA with dampened monophasic waveform pattern suggesting significant stenosis. Very mild calcification noted.  There is monophasic waveform pattern below left knee suggesting proximal stenosis.  This exam reveals moderately decreased perfusion of the right lower extremity, noted at the post tibial artery level (ABI0.61) with severely abnormal  monophasic waveform pattern at the right ankle level.   This exam reveals moderately decreased perfusion of the left lower extremity, noted at the post tibial artery level (ABI 0.60) with severely abnormal monophasic waveform pattern at the right ankle level.   EKG:   EKG 11/11/2022: Normal sinus rhythm at rate of 69 bpm, left atrial enlargement, normal axis.  Incomplete right bundle branch block.  No evidence of ischemia.  Otherwise normal EKG.  Compared to 05/06/2022, no significant change.  EKG 09/30/2021: Atrial fibrillation with rapid ventricular response at rate of 130 bpm, normal axis, no evidence of ischemia.  Normal QT interval.  Assessment     ICD-10-CM   1. Claudication in peripheral vascular disease (HCC)  I73.9 losartan (COZAAR) 50 MG tablet  CBC    Basic metabolic panel    2. Paroxysmal atrial fibrillation (HCC)  I48.0     3. Essential hypertension  I10 losartan (COZAAR) 50 MG tablet    4. Abnormal TSH  R79.89 Thyroid Panel With TSH    5. Pure hypercholesterolemia  E78.00 ezetimibe (ZETIA) 10 MG tablet      CHA2DS2-VASc Score is 5.  Yearly risk of stroke: 7% (F, A, HTN, DM, Vasc Dz).  Score of 1=0.6; 2=2.2; 3=3.2; 4=4.8; 5=7.2; 6=9.8; 7=>9.8) -(CHF; HTN; vasc disease DM,  Female = 1; Age <65 =0; 65-74 = 1,  >75 =2; stroke/embolism= 2).    There are no discontinued medications.    Meds ordered this encounter  Medications   losartan (COZAAR) 50 MG tablet    Sig: Take 1 tablet (50 mg total) by mouth daily.    Dispense:  30 tablet    Refill:  2   ezetimibe (ZETIA) 10 MG tablet    Sig: Take 1 tablet (10 mg total) by mouth daily.    Dispense:  90 tablet    Refill:  3    Recommendations:   [name] is a 67 y.o. 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     1. Claudication in peripheral vascular disease ([place]) Patient has severe symptoms of claudication, lower extremity arterial duplex revealed bilateral SFA high-grade stenosis with diffuse disease.  In view of marked decrease in her physical activity, we will proceed with peripheral arteriogram and possible angioplasty.  Will schedule for peripheral arteriogram and possible angioplasty given symptoms. Patient understands the risks, benefits, alternatives including medical therapy, CT angiography. Patient understands <1-2% risk of death, embolic complications, bleeding, infection, renal failure, urgent surgical revascularization, but not limited to these and wants to proceed.   2. Paroxysmal atrial fibrillation [facility]) Patient is maintaining sinus rhythm.  She was seen in [facility] about 3 weeks ago with epistaxis and needed 2 units of blood transfusion.  I discussed with her regarding use of Sudafed 2 tablets with onset of nasal bleed and also to use Afrin and ice pack.  Presently she is on Plavix and Eliquis, she can come off of Plavix as it has been over 1 year since her stenting to the right coronary artery however she is now going for peripheral arteriogram hence we will continue this for now and until PAD is ruled out and therapy evaluated.  3. Essential hypertension Blood pressure is elevated, she is not on an ACE inhibitor or an ARB.  Lisinopril was listed as an allergy but she is not sure of what this is.  I have started her on losartan 50 mg daily.  She will need follow-up labs.  4. Abnormal TSH She has abnormal TSH and I am not sure that she has had TSH followed through.  I will check thyroid panel.  5. Pure hypercholesterolemia Lipids are not at goal, I have added Zetia 10 mg  daily.  She will need repeat lipid profile testing in a few months.  This was a 40-minute office visit encounter.  Tobacco use cessation discussed with the patient, I  given some addresses for hypnosis as well as she has tried and Wellbutrin in the past.    Adrian Prows, MD, [facility] 01/04/2023, 10:24 AM Office: [phone] Fax: [phone] Pager: [phone]

## 2023-01-05 ENCOUNTER — Telehealth: Payer: Self-pay

## 2023-01-05 NOTE — Telephone Encounter (Signed)
Patient will try this and see if it helps.

## 2023-01-05 NOTE — Telephone Encounter (Signed)
Yes. Also she can take this in the evening

## 2023-01-05 NOTE — Telephone Encounter (Signed)
Can she cut Losartan in half to 25mg ? She wants to gradually increase it to 50 mgs. She thinks it is making her dizzy. Is this ok.

## 2023-01-06 ENCOUNTER — Encounter: Payer: Self-pay | Admitting: Hematology and Oncology

## 2023-01-25 ENCOUNTER — Encounter: Payer: PPO | Admitting: Cardiology

## 2023-01-25 ENCOUNTER — Encounter: Payer: Self-pay | Admitting: *Deleted

## 2023-01-26 LAB — CBC
Hematocrit: 36.8 % (ref 34.0–46.6)
Hemoglobin: 11.9 g/dL (ref 11.1–15.9)
MCH: 28.4 pg (ref 26.6–33.0)
MCHC: 32.3 g/dL (ref 31.5–35.7)
MCV: 88 fL (ref 79–97)
Platelets: 330 10*3/uL (ref 150–450)
RBC: 4.19 x10E6/uL (ref 3.77–5.28)
RDW: 13.3 % (ref 11.7–15.4)
WBC: 11.2 10*3/uL — ABNORMAL HIGH (ref 3.4–10.8)

## 2023-01-26 LAB — BASIC METABOLIC PANEL
BUN/Creatinine Ratio: 11 — ABNORMAL LOW (ref 12–28)
BUN: 11 mg/dL (ref 8–27)
CO2: 25 mmol/L (ref 20–29)
Calcium: 8.8 mg/dL (ref 8.7–10.3)
Chloride: 99 mmol/L (ref 96–106)
Creatinine, Ser: 1.02 mg/dL — ABNORMAL HIGH (ref 0.57–1.00)
Glucose: 80 mg/dL (ref 70–99)
Potassium: 4.2 mmol/L (ref 3.5–5.2)
Sodium: 137 mmol/L (ref 134–144)
eGFR: 61 mL/min/{1.73_m2} (ref >59)

## 2023-01-26 LAB — THYROID PANEL WITH TSH
Free Thyroxine Index: 1.4 (ref 1.2–4.9)
T3 Uptake Ratio: 23 % — ABNORMAL LOW (ref 24–39)
T4, Total: 6 ug/dL (ref 4.5–12.0)
TSH: 2.69 u[IU]/mL (ref 0.450–4.500)

## 2023-01-28 ENCOUNTER — Ambulatory Visit (HOSPITAL_COMMUNITY)
Admission: RE | Disposition: A | Discharge status: Home / Self Care | Payer: Self-pay | Source: Home / Self Care | Attending: Cardiology

## 2023-01-28 ENCOUNTER — Encounter (HOSPITAL_COMMUNITY): Payer: Self-pay | Admitting: Cardiology

## 2023-01-28 ENCOUNTER — Ambulatory Visit (HOSPITAL_COMMUNITY)
Admission: RE | Admit: 2023-01-28 | Discharge: 2023-01-28 | Disposition: A | Discharge status: Home / Self Care | Payer: PPO | Attending: Cardiology | Admitting: Cardiology

## 2023-01-28 ENCOUNTER — Other Ambulatory Visit: Payer: Self-pay

## 2023-01-28 DIAGNOSIS — J449 Chronic obstructive pulmonary disease, unspecified: Secondary | ICD-10-CM | POA: Insufficient documentation

## 2023-01-28 DIAGNOSIS — E7800 Pure hypercholesterolemia, unspecified: Secondary | ICD-10-CM | POA: Insufficient documentation

## 2023-01-28 DIAGNOSIS — E119 Type 2 diabetes mellitus without complications: Secondary | ICD-10-CM | POA: Insufficient documentation

## 2023-01-28 DIAGNOSIS — I70213 Atherosclerosis of native arteries of extremities with intermittent claudication, bilateral legs: Secondary | ICD-10-CM | POA: Diagnosis present

## 2023-01-28 DIAGNOSIS — Z951 Presence of aortocoronary bypass graft: Secondary | ICD-10-CM | POA: Insufficient documentation

## 2023-01-28 DIAGNOSIS — I2510 Atherosclerotic heart disease of native coronary artery without angina pectoris: Secondary | ICD-10-CM | POA: Diagnosis not present

## 2023-01-28 DIAGNOSIS — I10 Essential (primary) hypertension: Secondary | ICD-10-CM | POA: Diagnosis not present

## 2023-01-28 DIAGNOSIS — I739 Peripheral vascular disease, unspecified: Secondary | ICD-10-CM

## 2023-01-28 DIAGNOSIS — Z8249 Family history of ischemic heart disease and other diseases of the circulatory system: Secondary | ICD-10-CM | POA: Diagnosis not present

## 2023-01-28 DIAGNOSIS — I480 Paroxysmal atrial fibrillation: Secondary | ICD-10-CM | POA: Diagnosis not present

## 2023-01-28 DIAGNOSIS — F17210 Nicotine dependence, cigarettes, uncomplicated: Secondary | ICD-10-CM | POA: Insufficient documentation

## 2023-01-28 HISTORY — DX: Atherosclerotic heart disease of native coronary artery without angina pectoris: I25.10

## 2023-01-28 HISTORY — DX: Hyperlipidemia, unspecified: E78.5

## 2023-01-28 HISTORY — PX: PERIPHERAL VASCULAR ATHERECTOMY: CATH118256

## 2023-01-28 HISTORY — PX: ABDOMINAL AORTOGRAM W/LOWER EXTREMITY: CATH118223

## 2023-01-28 HISTORY — PX: PERIPHERAL VASCULAR BALLOON ANGIOPLASTY: CATH118281

## 2023-01-28 HISTORY — DX: Peripheral vascular disease, unspecified: I73.9

## 2023-01-28 LAB — POCT ACTIVATED CLOTTING TIME
Activated Clotting Time: 314 seconds
Activated Clotting Time: 325 seconds
Activated Clotting Time: 417 seconds

## 2023-01-28 LAB — POCT I-STAT, CHEM 8
BUN: 11 mg/dL (ref 8–23)
Calcium, Ion: 1.26 mmol/L (ref 1.15–1.40)
Chloride: 101 mmol/L (ref 98–111)
Creatinine, Ser: 0.8 mg/dL (ref 0.44–1.00)
Glucose, Bld: 106 mg/dL — ABNORMAL HIGH (ref 70–99)
HCT: 35 % — ABNORMAL LOW (ref 36.0–46.0)
Hemoglobin: 11.9 g/dL — ABNORMAL LOW (ref 12.0–15.0)
Potassium: 3.9 mmol/L (ref 3.5–5.1)
Sodium: 140 mmol/L (ref 135–145)
TCO2: 30 mmol/L (ref 22–32)

## 2023-01-28 LAB — GLUCOSE, CAPILLARY: Glucose-Capillary: 134 mg/dL — ABNORMAL HIGH (ref 70–99)

## 2023-01-28 SURGERY — ABDOMINAL AORTOGRAM W/LOWER EXTREMITY
Anesthesia: LOCAL

## 2023-01-28 MED ORDER — ONDANSETRON HCL 4 MG/2ML IJ SOLN
4.0000 mg | Freq: Four times a day (QID) | INTRAMUSCULAR | Status: DC | PRN
  Administered 2023-01-28: 4 mg via INTRAVENOUS
  Filled 2023-01-28: qty 2

## 2023-01-28 MED ORDER — IODIXANOL 320 MG/ML IV SOLN
INTRAVENOUS | Status: DC | PRN
  Administered 2023-01-28: 215 mL

## 2023-01-28 MED ORDER — NITROGLYCERIN 1 MG/10 ML FOR IR/CATH LAB
INTRA_ARTERIAL | Status: DC | PRN
  Administered 2023-01-28 (×4): 400 ug via INTRA_ARTERIAL

## 2023-01-28 MED ORDER — HEPARIN (PORCINE) IN NACL 1000-0.9 UT/500ML-% IV SOLN
INTRAVENOUS | Status: DC | PRN
  Administered 2023-01-28 (×2): 500 mL

## 2023-01-28 MED ORDER — SODIUM CHLORIDE 0.9% FLUSH: 3.0000 mL | INTRAVENOUS | Status: DC | PRN

## 2023-01-28 MED ORDER — FENTANYL CITRATE (PF) 100 MCG/2ML IJ SOLN
INTRAMUSCULAR | Status: DC | PRN
  Administered 2023-01-28 (×2): 50 ug via INTRAVENOUS
  Administered 2023-01-28: 25 ug via INTRAVENOUS
  Administered 2023-01-28: 50 ug via INTRAVENOUS
  Administered 2023-01-28: 25 ug via INTRAVENOUS

## 2023-01-28 MED ORDER — ASPIRIN 81 MG PO CHEW
CHEWABLE_TABLET | ORAL | Status: AC
  Filled 2023-01-28: qty 1

## 2023-01-28 MED ORDER — SODIUM CHLORIDE 0.9% FLUSH: 3.0000 mL | Freq: Two times a day (BID) | INTRAVENOUS | Status: DC

## 2023-01-28 MED ORDER — FENTANYL CITRATE (PF) 100 MCG/2ML IJ SOLN
INTRAMUSCULAR | Status: AC
  Filled 2023-01-28: qty 2

## 2023-01-28 MED ORDER — ASPIRIN 81 MG PO CHEW
CHEWABLE_TABLET | ORAL | Status: DC | PRN
  Administered 2023-01-28: 81 mg via ORAL

## 2023-01-28 MED ORDER — LOSARTAN POTASSIUM 50 MG PO TABS
50.0000 mg | ORAL_TABLET | Freq: Every day | ORAL | 0 refills | Status: DC
Start: 2023-01-28 — End: 2023-02-10

## 2023-01-28 MED ORDER — MIDAZOLAM HCL 2 MG/2ML IJ SOLN
INTRAMUSCULAR | Status: AC
  Filled 2023-01-28: qty 2

## 2023-01-28 MED ORDER — LIDOCAINE HCL (PF) 1 % IJ SOLN
INTRAMUSCULAR | Status: AC
  Filled 2023-01-28: qty 30

## 2023-01-28 MED ORDER — HEPARIN SODIUM (PORCINE) 1000 UNIT/ML IJ SOLN
INTRAMUSCULAR | Status: DC | PRN
  Administered 2023-01-28: 2000 [IU] via INTRAVENOUS
  Administered 2023-01-28: 8000 [IU] via INTRAVENOUS

## 2023-01-28 MED ORDER — VERAPAMIL HCL 2.5 MG/ML IV SOLN
INTRAVENOUS | Status: AC
  Filled 2023-01-28: qty 2

## 2023-01-28 MED ORDER — OXYCODONE-ACETAMINOPHEN 10-325 MG PO TABS: 1.0000 | ORAL_TABLET | Freq: Four times a day (QID) | ORAL | 0 refills | Status: DC | PRN

## 2023-01-28 MED ORDER — HYDRALAZINE HCL 20 MG/ML IJ SOLN
5.0000 mg | INTRAMUSCULAR | Status: DC | PRN
  Administered 2023-01-28: 5 mg via INTRAVENOUS
  Filled 2023-01-28: qty 1

## 2023-01-28 MED ORDER — FENTANYL CITRATE (PF) 100 MCG/2ML IJ SOLN
50.0000 ug | INTRAMUSCULAR | Status: DC | PRN
  Administered 2023-01-28 (×2): 50 ug via INTRAVENOUS
  Filled 2023-01-28 (×2): qty 2

## 2023-01-28 MED ORDER — HEPARIN SODIUM (PORCINE) 1000 UNIT/ML IJ SOLN
INTRAMUSCULAR | Status: AC
  Filled 2023-01-28: qty 10

## 2023-01-28 MED ORDER — VANCOMYCIN HCL IN DEXTROSE 1-5 GM/200ML-% IV SOLN
INTRAVENOUS | Status: DC | PRN
  Administered 2023-01-28: 1000 mg via INTRAVENOUS

## 2023-01-28 MED ORDER — SODIUM CHLORIDE 0.9 % WEIGHT BASED INFUSION: 1.0000 mL/kg/h | INTRAVENOUS | Status: DC

## 2023-01-28 MED ORDER — VIPERSLIDE LUBRICANT OPTIME
TOPICAL | Status: DC | PRN
  Administered 2023-01-28: 20 mL via SURGICAL_CAVITY

## 2023-01-28 MED ORDER — VANCOMYCIN HCL IN DEXTROSE 1-5 GM/200ML-% IV SOLN
INTRAVENOUS | Status: AC
  Filled 2023-01-28: qty 200

## 2023-01-28 MED ORDER — SODIUM CHLORIDE 0.9 % IV SOLN
12.5000 mg | Freq: Once | INTRAVENOUS | Status: AC
  Administered 2023-01-28: 12.5 mg via INTRAVENOUS
  Filled 2023-01-28: qty 0.5

## 2023-01-28 MED ORDER — SODIUM CHLORIDE 0.9 % IV SOLN: 250.0000 mL | INTRAVENOUS | Status: DC | PRN

## 2023-01-28 MED ORDER — LIDOCAINE HCL (PF) 1 % IJ SOLN
INTRAMUSCULAR | Status: DC | PRN
  Administered 2023-01-28: 12 mL

## 2023-01-28 MED ORDER — NITROGLYCERIN IN D5W 200-5 MCG/ML-% IV SOLN
INTRAVENOUS | Status: AC
  Filled 2023-01-28: qty 250

## 2023-01-28 MED ORDER — SODIUM CHLORIDE 0.9% FLUSH
3.0000 mL | INTRAVENOUS | Status: DC | PRN
  Administered 2023-01-28 (×2): 3 mL via INTRAVENOUS

## 2023-01-28 MED ORDER — SODIUM CHLORIDE 0.9 % IV SOLN: INTRAVENOUS | Status: DC

## 2023-01-28 MED ORDER — SODIUM CHLORIDE 0.9 % IV BOLUS
500.0000 mL | Freq: Once | INTRAVENOUS | Status: AC
  Administered 2023-01-28: 500 mL via INTRAVENOUS

## 2023-01-28 MED ORDER — MIDAZOLAM HCL 2 MG/2ML IJ SOLN
INTRAMUSCULAR | Status: DC | PRN
  Administered 2023-01-28: 2 mg via INTRAVENOUS

## 2023-01-28 SURGICAL SUPPLY — 31 items
BALLN ADMIRAL INPACT 5X250 (BALLOONS) ×2
BALLN IN.PACT DCB 5X60 (BALLOONS) ×2
BALLN STERLING OTW 5X220X150 (BALLOONS) ×2
BALLOON ADMIRAL INPACT 5X250 (BALLOONS) IMPLANT
BALLOON STERLING OTW 5X220X150 (BALLOONS) IMPLANT
CATH OMNI FLUSH 5F 65CM (CATHETERS) IMPLANT
CATH SYNTRAX .014X150 (CATHETERS) IMPLANT
CLOSURE PERCLOSE PROSTYLE (VASCULAR PRODUCTS) IMPLANT
DCB IN.PACT 5X60 (BALLOONS) IMPLANT
DEVICE CLOSURE FEM ARTERY (HEMOSTASIS) ×2
DEVICE TORQUE .014-.018 (MISCELLANEOUS) IMPLANT
DIAMONDBACK SOLID OAS 1.5MM (CATHETERS) ×2
FEM STOP ARCH (HEMOSTASIS) ×2
GUIDEWIRE ASTATO XS 20G 300CM (WIRE) IMPLANT
GUIDEWIRE NITREX 0.018X80X5 (WIRE) IMPLANT
KIT ENCORE 26 ADVANTAGE (KITS) IMPLANT
KIT MICROPUNCTURE NIT STIFF (SHEATH) IMPLANT
KIT PV (KITS) ×3 IMPLANT
LUBRICANT VIPERSLIDE CORONARY (MISCELLANEOUS) IMPLANT
SHEATH CATAPULT 7F 45 MP (SHEATH) IMPLANT
SHEATH PINNACLE 5F 10CM (SHEATH) IMPLANT
STOPCOCK MORSE 400PSI 3WAY (MISCELLANEOUS) IMPLANT
SYR MEDRAD MARK 7 150ML (SYRINGE) ×3 IMPLANT
SYSTEM COMPRESSION FEMOSTOP (HEMOSTASIS) IMPLANT
SYSTEM DIMNDBCK SLD OAS 1.5MM (CATHETERS) IMPLANT
TAPE SHOOT N SEE (TAPE) IMPLANT
TORQUE DEVICE .014-.018 (MISCELLANEOUS) ×2
TRANSDUCER W/STOPCOCK (MISCELLANEOUS) ×3 IMPLANT
TRAY PV CATH (CUSTOM PROCEDURE TRAY) ×3 IMPLANT
WIRE BENTSON .035X145CM (WIRE) IMPLANT
WIRE VIPER WIRECTO 0.014 (WIRE) IMPLANT

## 2023-01-28 NOTE — Progress Notes (Signed)
Patient evaluated had groin bleed minor without hematoma. Should be able to be discharged in 4 hours if no further bleed.   Yates Decamp, MD, [facility] 01/28/2023, 5:29 PM Office: [phone] Fax: [phone] Pager: [phone]

## 2023-01-28 NOTE — Discharge Instructions (Addendum)
Femoral Site Care This sheet gives you information about how to care for yourself after your procedure. Your health care provider may also give you more specific instructions. If you have problems or questions, contact your health care provider. What can I expect after the procedure?  After the procedure, it is common to have: Bruising that usually fades within 1-2 weeks. Tenderness at the site. Follow these instructions at home: Wound care Follow instructions from your health care provider about how to take care of your insertion site. Make sure you: Wash your hands with soap and water before you change your bandage (dressing). If soap and water are not available, use hand sanitizer. Remove your dressing as told by your health care provider. In 24 hours Do not take baths, swim, or use a hot tub until your health care provider approves. You may shower 24-48 hours after the procedure or as told by your health care provider. Gently wash the site with plain soap and water. Pat the area dry with a clean towel. Do not rub the site. This may cause bleeding. Do not apply powder or lotion to the site. Keep the site clean and dry. Check your femoral site every day for signs of infection. Check for: Redness, swelling, or pain. Fluid or blood. Warmth. Pus or a bad smell. Activity For the first 2-3 days after your procedure, or as long as directed: Avoid climbing stairs as much as possible. Do not squat. Do not lift anything that is heavier than 10 lb (4.5 kg), or the limit that you are told, until your health care provider says that it is safe. For 5 days Rest as directed. Avoid sitting for a long time without moving. Get up to take short walks every 1-2 hours. Do not drive for 24 hours if you were given a medicine to help you relax (sedative). General instructions Take over-the-counter and prescription medicines only as told by your health care provider. Keep all follow-up visits as told by  your health care provider. This is important. Contact a health care provider if you have: A fever or chills. You have redness, swelling, or pain around your insertion site. Get help right away if: The catheter insertion area swells very fast. You pass out. You suddenly start to sweat or your skin gets clammy. The catheter insertion area is bleeding, and the bleeding does not stop when you hold steady pressure on the area. The area near or just beyond the catheter insertion site becomes pale, cool, tingly, or numb. These symptoms may represent a serious problem that is an emergency. Do not wait to see if the symptoms will go away. Get medical help right away. Call your local emergency services (911 in the [place] Do not drive yourself to the hospital. Summary After the procedure, it is common to have bruising that usually fades within 1-2 weeks. Check your femoral site every day for signs of infection. Do not lift anything that is heavier than 10 lb (4.5 kg), or the limit that you are told, until your health care provider says that it is safe. This information is not intended to replace advice given to you by your health care provider. Make sure you discuss any questions you have with your health care provider. Document Revised: 10/17/2017 Document Reviewed: 10/17/2017 Elsevier Patient Education  2020 [employer] 

## 2023-01-28 NOTE — Interval H&P Note (Signed)
History and Physical Interval Note:  01/28/2023 7:43 AM  [name]  has presented today for surgery, with the diagnosis of hp.  The various methods of treatment have been discussed with the patient and family. After consideration of risks, benefits and other options for treatment, the patient has consented to  Procedure(s): ABDOMINAL AORTOGRAM W/LOWER EXTREMITY (N/A) and peripheral angioplasty as a surgical intervention.  The patient's history has been reviewed, patient examined, no change in status, stable for surgery.  I have reviewed the patient's chart and labs.  Questions were answered to the patient's satisfaction.     Yates Decamp

## 2023-01-31 ENCOUNTER — Encounter (HOSPITAL_COMMUNITY): Payer: Self-pay | Admitting: Cardiology

## 2023-01-31 MED FILL — Nitroglycerin IV Soln 200 MCG/ML in D5W: INTRAVENOUS | Qty: 250 | Status: AC

## 2023-01-31 MED FILL — Verapamil HCl IV Soln 2.5 MG/ML: INTRAVENOUS | Qty: 2 | Status: AC

## 2023-02-10 ENCOUNTER — Encounter: Payer: Self-pay | Admitting: Cardiology

## 2023-02-10 ENCOUNTER — Ambulatory Visit: Payer: PPO | Admitting: Cardiology

## 2023-02-10 VITALS — BP 151/49 | HR 75 | Resp 16 | Ht 63.0 in | Wt 126.0 lb

## 2023-02-10 DIAGNOSIS — I10 Essential (primary) hypertension: Secondary | ICD-10-CM

## 2023-02-10 DIAGNOSIS — M7989 Other specified soft tissue disorders: Secondary | ICD-10-CM

## 2023-02-10 DIAGNOSIS — I739 Peripheral vascular disease, unspecified: Secondary | ICD-10-CM

## 2023-02-10 DIAGNOSIS — I25118 Atherosclerotic heart disease of native coronary artery with other forms of angina pectoris: Secondary | ICD-10-CM

## 2023-02-10 DIAGNOSIS — E7800 Pure hypercholesterolemia, unspecified: Secondary | ICD-10-CM

## 2023-02-10 MED ORDER — LOSARTAN POTASSIUM 50 MG PO TABS
50.0000 mg | ORAL_TABLET | Freq: Every evening | ORAL | 3 refills | Status: DC
Start: 2023-02-10 — End: 2024-02-14

## 2023-02-10 MED ORDER — BUMETANIDE 0.5 MG PO TABS
0.5000 mg | ORAL_TABLET | Freq: Every day | ORAL | 2 refills | Status: DC | PRN
Start: 2023-02-10 — End: 2023-06-09

## 2023-02-10 NOTE — Progress Notes (Signed)
Primary Physician/Referring:  Julianne Handler, NP  Patient ID: [name], female    DOB: 1955/11/12, 67 y.o.   MRN: [id number]  Chief Complaint  Patient presents with   Coronary Artery Disease   Follow-up    Post cath   HPI:    [name]  is a 67 y.o. 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  Patient underwent peripheral arteriogram and successful revascularization with complex directional atherectomy followed by angioplasty of the left SFA on 01/28/2023.  She has residual right SFA occlusion to high-grade stenosis.  Since then states that her symptoms of claudication improved significantly.  She did have mild ecchymosis involving the right groin, no other complications.  Denies chest pain, change in dyspnea, orthopnea, PND, leg edema.  Past Medical History:  Diagnosis Date   Acute diverticulitis 03/30/2020   Acute ST elevation myocardial infarction (STEMI) involving other coronary artery of inferior wall 09/23/2021   Acute ST elevation myocardial infarction (STEMI) of inferior wall 09/22/2021   Asthma    Benign essential HTN    Claudication in peripheral vascular disease    Coronary artery disease    Hyperlipidemia    IBS (irritable bowel syndrome)    Migraine headache    Past Surgical History:  Procedure Laterality Date   ABDOMINAL AORTOGRAM W/LOWER EXTREMITY N/A 01/28/2023   Procedure: ABDOMINAL AORTOGRAM W/LOWER EXTREMITY;  Surgeon: Yates Decamp, MD;  Location: [facility];  Service: Cardiovascular;  Laterality: N/A;   CORONARY/GRAFT ACUTE MI REVASCULARIZATION N/A 09/22/2021    Procedure: Coronary/Graft Acute MI Revascularization;  Surgeon: Yates Decamp, MD;  Location: [facility];  Service: Cardiovascular;  Laterality: N/A;   LEFT HEART CATH AND CORONARY ANGIOGRAPHY N/A 09/22/2021   Procedure: LEFT HEART CATH AND CORONARY ANGIOGRAPHY;  Surgeon: Yates Decamp, MD;  Location: [facility];  Service: Cardiovascular;  Laterality: N/A;   PERIPHERAL VASCULAR ATHERECTOMY  01/28/2023   Procedure: PERIPHERAL VASCULAR ATHERECTOMY;  Surgeon: Yates Decamp, MD;  Location: [facility] IN[facility] CV [facility];  Service: Cardiovascular;;   PERIPHERAL VASCULAR BALLOON ANGIOPLASTY  01/28/2023   Procedure: PERIPHERAL VASCULAR BALLOON ANGIOPLASTY;  Surgeon: Yates Decamp, MD;  Location: [facility];  Service: Cardiovascular;;   REVERSE SHOULDER ARTHROPLASTY Left 09/16/2021   Procedure: REVERSE SHOULDER ARTHROPLASTY;  Surgeon: Beverely Low, MD;  Location: [facility];  Service: Orthopedics;  Laterality: Left;   Family History  Problem Relation Age of Onset   Hypertension Mother    Cancer - Other Father        Oropharyngeal SCCa   CAD Brother        Deceased on MI age 77    Social History   Tobacco Use   Smoking status: Some Days    Packs/day: 1    Types: Cigarettes   Smokeless tobacco: Never   Tobacco comments:    >5 cigarettes per day  Substance Use Topics   Alcohol use: Yes    Alcohol/week: 0.0 standard drinks of alcohol    Comment: Occasional to Rare   Marital Status: Married   ROS  Review of Systems  Cardiovascular:  Positive for claudication. Negative for chest  pain, dyspnea on exertion and leg swelling.   Objective  Blood pressure (!) 151/49, pulse 75, resp. rate 16, height  (1.6 m), weight 126 lb (57.2 kg), SpO2 96 %.     02/10/2023   11:36 AM 01/28/2023    7:07 PM 01/28/2023    7:03 PM  Vitals with BMI  Height     Weight 126 lbs    BMI 22.33    Systolic 151 118 161  Diastolic 49 46 47  Pulse 75 95 92      Physical Exam Neck:     Vascular: Carotid bruit  (bilateral) present. No JVD.  Cardiovascular:     Rate and Rhythm: Normal rate and regular rhythm.     Pulses:          Popliteal pulses are 0 on the right side and 2+ on the left side.       Dorsalis pedis pulses are 0 on the right side and 0 on the left side.       Posterior tibial pulses are 0 on the right side and 0 on the left side.     Heart sounds: Heart sounds are distant. No murmur heard.    No gallop.  Pulmonary:     Effort: Pulmonary effort is normal.     Breath sounds: Decreased air movement present. Examination of the right-lower field reveals decreased breath sounds. Examination of the left-lower field reveals decreased breath sounds. Decreased breath sounds present.  Abdominal:     General: Bowel sounds are normal.     Palpations: Abdomen is soft.  Musculoskeletal:     Right lower leg: No edema.     Left lower leg: Edema (1-2+ ankle edema pitting) present.    Laboratory examination:   Recent Labs    03/23/22 0905 10/28/22 1340 01/25/23 0910 01/28/23 0639  NA 138 137 137 140  K 4.4 4.6 4.2 3.9  CL 103 102 99 101  CO2 30 32 25  --   GLUCOSE 120* 157* 80 106*  BUN CREATININE 0.98 0.92 1.02* 0.80  CALCIUM 9.6 8.8* 8.8  --   GFRNONAA >60 >60  --   --       Latest Ref Rng & Units 01/28/2023    6:39 AM 01/25/2023    9:10 AM 10/28/2022    1:40 PM  CMP  Glucose 70 - 99 mg/dL 096  80  045   BUN 8 - 23 mg/dL Creatinine 0.44 - 1.00 mg/dL 4.09  8.11  9.14   Sodium 135 - 145 mmol/L 140  137  137   Potassium 3.5 - 5.1 mmol/L 3.9  4.2  4.6   Chloride 98 - 111 mmol/L 101  99  102   CO2 20 - 29 mmol/L  25  32   Calcium 8.7 - 10.3 mg/dL  8.8  8.8   Total Protein 6.5 - 8.1 g/dL   5.9   Total Bilirubin 0.3 - 1.2 mg/dL   0.2   Alkaline Phos 38 - 126 U/L   61   AST 15 - 41 U/L   13   ALT 0 - 44 U/L   9       Latest Ref Rng & Units 01/28/2023    6:39 AM 01/25/2023    9:10 AM 10/28/2022    1:40 PM  CBC  WBC 3.4 - 10.8 x10E3/uL  11.2  9.4  Hemoglobin 12.0 - 15.0 g/dL 96.0  45.4  09.8   Hematocrit 36.0 - 46.0 % 35.0  36.8  35.8   Platelets 150 - 450 x10E3/uL  330  284    Lab Results  Component Value Date   CHOL 192 11/19/2022   HDL 77 11/19/2022   LDLCALC 98 11/19/2022   LDLDIRECT 173.8 10/24/2009   TRIG 96 11/19/2022   CHOLHDL 6.0 09/24/2021    HEMOGLOBIN A1C Lab Results  Component Value Date   HGBA1C 6.7 (H) 09/17/2021   MPG 146 09/17/2021   Lab Results  Component Value Date   TSH 2.690 01/25/2023  Total T46.0, normal and T3 uptake minimally reduced at 23 (24-39%), FTI 1.4, normal.  Radiology:   No results found.  Cardiac Studies:   CABG 04/29/2016: LIMA to LAD, SVG to RCA, SVG to OM1, SVG to OM2 for left main 90% stenosis 50% stenosis in circumflex and 60% stenosis in OM1, 50% stenosis in proximal RCA.   Left Heart Catheterization 09/23/21:  RCA diffuse in the proximal and mid RCA with ulceration and scattered 80-99% stenosis.  Successful PCI to ulcerated high grade native RCA with implantation of 3.0x48 mm Synergy SD in the proximal to mid RCA. Timi 3 to 3 flow. LM diffuse 80% stenosis.  LAD patent with mild disease and LIMA to LAD patent.  CX: Moderate OM1 widely patent. Cx severely diseased after origin of OM 1 with 90-95% stenosis.  Small diseased OM-2 RI: Small with ostial mild disease.  SVG to OM1-2 skip graft occluded, SVG to RCA occluded.  LV: EF 35-40% with inferior akinesis. No MR.  Hemodynamics: LV 112/18, EDP 25 mm Hg. Ao 114/80, mean 112 mm Hg. No pressure gradient across the AV.      PCV ECHOCARDIOGRAM COMPLETE 11/23/2021 Left ventricle cavity is normal in size and wall thickness. Abnormal septal wall motion due to post-operative coronary artery bypass graft. Normal LV systolic function with EF 61%. Doppler evidence of grade I (impaired) diastolic dysfunction, normal LAP. No significant valvular abnormality. Normal right atrial pressure.  Compared to the study 09/23/2021, LVEF 45%,  PASP 50 mm Hg, Mod-severe posteriorly directed MR not present.    Carotid artery duplex 06/17/2022: Duplex suggests stenosis in the right internal carotid artery (minimal). Duplex suggests stenosis in the right external carotid artery (<50%). Duplex suggests stenosis in the left internal carotid artery (1-15%). Duplex suggests stenosis in the left external carotid artery (<50%). There is mild heterogeneous plaque noted in the carotid arteries  Antegrade right vertebral artery flow. Antegrade left vertebral artery flow. Follow up appropriate if clinically indicated.  Lower Extremity Arterial Duplex 11/16/2022: There is diffuse plaque noted in the proximal to distal right SFA with dampened monophasic waveform pattern suggesting significant stenosis. Very mild calcification noted.  There is monophasic waveform pattern below right knee suggesting proximal stenosis.  There is diffuse plaque noted in the proximal to distal left SFA with dampened monophasic waveform pattern suggesting significant stenosis. Very mild calcification noted.  There is monophasic waveform pattern below left knee suggesting proximal stenosis.  This exam reveals moderately decreased perfusion of the right lower extremity, noted at the post tibial artery level (ABI0.61) with severely abnormal monophasic waveform pattern at the right ankle level.   This exam reveals moderately decreased perfusion of the left lower extremity, noted at the post tibial artery level (ABI 0.60) with severely abnormal monophasic waveform pattern at the right ankle level.    Peripheral arteriogram and angioplasty 01/28/2023: Abdominal aortogram: Presence of  2 renal arteries 1 on either sides.  There is mild atherosclerotic changes noted in the bilateral renal arteries without high-grade stenosis.  There is moderate diffuse calcification of the abdominal aorta.  Aortoiliac bifurcation widely patent.  Iliac vessels are widely patent.   Right femoral  arteriogram with distal runoff: Right SFA is subtotally occluded all the way from the origin to the distal SFA at the exit site of Hunter's canal.  Below the right knee there appears to be three-vessel runoff.  There is moderate amount of calcification throughout the right SFA.  Distal SFA followed by collaterals from profunda.   Left femoral arteriogram with distal runoff: Left SFA in the proximal and mid segment is subtotally occluded followed by CTO in the mid to distal segment.  Moderate calcification noted.  There is three-vessel runoff below the left knee.  Distal SFA filled by collaterals from profunda.   Successful left SFA orbital atherectomy with 1.5 mm solid crown diamondback catheter followed by drug-coated balloon angioplasty with 250 x 5 mm in the distal segment and 40 x 5 mm In.[facility] with almost 0% distal stenosis with brisk flow and maintenance of three-vessel runoff.     EKG:   EKG 11/11/2022: Normal sinus rhythm at rate of 69 bpm, left atrial enlargement, normal axis.  Incomplete right bundle branch block.  No evidence of ischemia.  Otherwise normal EKG.  Compared to 05/06/2022, no significant change.  EKG 09/30/2021: Atrial fibrillation with rapid ventricular response at rate of 130 bpm, normal axis, no evidence of ischemia.  Normal QT interval.  Allergies & Meds   Allergies  Allergen Reactions   Cefepime Anaphylaxis, Shortness Of Breath, Swelling and Dermatitis   Edoxaban Other (See Comments)    Causes hematomas   Penicillins Anaphylaxis   Barium Iodide Rash   Other     Preservatives  (C-Diff)    Acetaminophen     Will not take after Cyanide deaths, something changed when they mkt'd again.  Can take Childrens Liquid but sometimes upset stomach too   Albuterol Hypertension    ABLE TO TOLERATE IN INHALER FORM NOT NEBULIZER    Beta Adrenergic Blockers     Paradoxical reactions   Prednisone     REACTION: ibs probs   Propofol     REACTION: FEVER   Repatha  [Evolocumab] Other (See Comments)    Knocked her out for 10 days   Sulfamethoxazole-Trimethoprim     REACTION: antibiotic induced colitis   Milk (Cow) Other (See Comments)    GI     Current Outpatient Medications:    albuterol (PROVENTIL HFA;VENTOLIN HFA) 108 (90 BASE) MCG/ACT inhaler, Inhale 2 puffs into the lungs 3 (three) times daily as needed for wheezing or shortness of breath., Disp: , Rfl:    apixaban (ELIQUIS) 5 MG TABS tablet, Take 1 tablet (5 mg total) by mouth 2 (two) times daily., Disp: 180 tablet, Rfl: 3   atorvastatin (LIPITOR) 80 MG tablet, Take 80 mg by mouth daily., Disp: , Rfl:    Budeson-Glycopyrrol-Formoterol (BREZTRI AEROSPHERE) 160-9-4.8 MCG/ACT AERO, Inhale 2 puffs into the lungs in the morning and at bedtime., Disp: 10.7 g, Rfl: 6   bumetanide (BUMEX) 0.5 MG tablet, Take 1 tablet (0.5 mg total) by mouth daily as needed (Leg swelling)., Disp: 30 tablet, Rfl: 2   clopidogrel (PLAVIX) 75 MG tablet, Take 1 tablet (75 mg total) by mouth daily., Disp: 90 tablet, Rfl: 3   cyanocobalamin (VITAMIN B12) 1000 MCG/ML injection, Inject 1,000 mcg  as directed every 30 (thirty) days., Disp: , Rfl:    dapagliflozin propanediol (FARXIGA) 10 MG TABS tablet, Take 1 tablet (10 mg total) by mouth daily., Disp: 90 tablet, Rfl: 3   diltiazem (CARDIZEM CD) 120 MG 24 hr capsule, Take 1 capsule (120 mg total) by mouth daily., Disp: 90 capsule, Rfl: 3   ezetimibe (ZETIA) 10 MG tablet, Take 1 tablet (10 mg total) by mouth daily., Disp: 90 tablet, Rfl: 3   magnesium 30 MG tablet, Take 30 mg by mouth daily., Disp: , Rfl:    metoprolol tartrate (LOPRESSOR) 50 MG tablet, Take 1 tablet (50 mg total) by mouth 2 (two) times daily., Disp: 180 tablet, Rfl: 3   nitroGLYCERIN (NITROSTAT) 0.4 MG SL tablet, Place 0.4 mg under the tongue every 5 (five) minutes as needed for chest pain., Disp: , Rfl:    potassium chloride (KLOR-CON) 10 MEQ tablet, Take 20 mEq by mouth daily as needed (With furosemide)., Disp: ,  Rfl:    pregabalin (LYRICA) 50 MG capsule, Take 50 mg by mouth 3 (three) times daily., Disp: , Rfl:    Vitamin D, Ergocalciferol, (DRISDOL) 1.25 MG (50000 UNIT) CAPS capsule, Take 50,000 Units by mouth once a week., Disp: , Rfl:    losartan (COZAAR) 50 MG tablet, Take 1 tablet (50 mg total) by mouth every evening., Disp: 90 tablet, Rfl: 3   promethazine (PHENERGAN) 25 MG suppository, Place 25 mg rectally daily as needed for nausea. (Patient not taking: Reported on 02/10/2023), Disp: , Rfl:    Assessment     ICD-10-CM   1. Claudication in peripheral vascular disease  I73.9 PCV ANKLE BRACHIAL INDEX (ABI)    losartan (COZAAR) 50 MG tablet    2. Coronary artery disease of native artery of native heart with stable angina pectoris  I25.118     3. Pure hypercholesterolemia  E78.00 Lipoprotein A (LPA)    Lipid Panel With LDL/HDL Ratio    4. Leg swelling  M79.89 bumetanide (BUMEX) 0.5 MG tablet    5. Essential hypertension  I10 losartan (COZAAR) 50 MG tablet      CHA2DS2-VASc Score is 5.  Yearly risk of stroke: 7% (F, A, HTN, DM, Vasc Dz).  Score of 1=0.6; 2=2.2; 3=3.2; 4=4.8; 5=7.2; 6=9.8; 7=>9.8) -(CHF; HTN; vasc disease DM,  Female = 1; Age <65 =0; 65-74 = 1,  >75 =2; stroke/embolism= 2).    Medications Discontinued During This Encounter  Medication Reason   oxyCODONE-acetaminophen (PERCOCET) 10-325 MG tablet    furosemide (LASIX) 20 MG tablet Ineffective   losartan (COZAAR) 50 MG tablet Reorder      Meds ordered this encounter  Medications   bumetanide (BUMEX) 0.5 MG tablet    Sig: Take 1 tablet (0.5 mg total) by mouth daily as needed (Leg swelling).    Dispense:  30 tablet    Refill:  2    Discontinue furosemide   losartan (COZAAR) 50 MG tablet    Sig: Take 1 tablet (50 mg total) by mouth every evening.    Dispense:  90 tablet    Refill:  3    Recommendations:   [name] is a 67 y.o. 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  1. Claudication in peripheral vascular disease Patient underwent peripheral arteriogram and successful revascularization with complex directional atherectomy followed by angioplasty of the left SFA on 01/28/2023.  She has residual right SFA occlusion to high-grade stenosis.  Patient developed mild ecchymosis of her right groin, but no other complications.  She has started to walk and states that she is able to walk much longer now and sometimes she does still get claudication.  She wants to wait on proceeding with angiography and angioplasty of right SFA.  She will need ABI to establish a new baseline.  She has good popliteal pulse on the left now.  - PCV ANKLE BRACHIAL INDEX (ABI); Future - losartan (COZAAR) 50 MG tablet; Take 1 tablet (50 mg total) by mouth every evening.  Dispense: 90 tablet; Refill: 3  2. Coronary artery disease of native artery of native heart with stable angina pectoris Patient is presently doing well, no recurrence of angina pectoris.  3. Pure hypercholesterolemia Lipids under reevaluation as her LDL was not at target had added Zetia.  I will check lipid profile testing in Lp(a). - Lipoprotein A (LPA) - Lipid Panel With LDL/HDL Ratio  4. Leg swelling She has chronic occasional leg edema for which she takes Lasix, this is stopped working.  Will discontinue this and use Bumex to be taken on a as needed basis.  Left leg edema is post revascularization of lower extremity which is to be expected. - bumetanide (BUMEX) 0.5 MG tablet; Take 1 tablet (0.5 mg total) by mouth daily as needed (Leg swelling).  Dispense: 30 tablet; Refill: 2  5.  Essential hypertension Blood pressure remains elevated however patient states that her blood pressure at home has been very good in fact sometimes it is low that she feels markedly dizzy.  I did not make any changes to her medications, continued losartan.  - losartan (COZAAR) 50 MG tablet; Take 1 tablet (50 mg total) by mouth every evening.  Dispense: 90 tablet; Refill: 3    Yates Decamp, MD, [facility] 02/10/2023, 12:18 PM Office: [phone] Fax: [phone] Pager: [phone]

## 2023-03-08 ENCOUNTER — Other Ambulatory Visit: Payer: PPO

## 2023-04-28 ENCOUNTER — Other Ambulatory Visit: Payer: Self-pay | Admitting: Hematology and Oncology

## 2023-04-28 ENCOUNTER — Inpatient Hospital Stay: Payer: PPO | Attending: Hematology and Oncology

## 2023-04-28 DIAGNOSIS — D72825 Bandemia: Secondary | ICD-10-CM

## 2023-04-28 DIAGNOSIS — D500 Iron deficiency anemia secondary to blood loss (chronic): Secondary | ICD-10-CM

## 2023-05-05 ENCOUNTER — Ambulatory Visit: Payer: PPO

## 2023-05-05 DIAGNOSIS — I739 Peripheral vascular disease, unspecified: Secondary | ICD-10-CM

## 2023-05-10 NOTE — Progress Notes (Signed)
ABI 05/05/2023: This exam reveals moderately decreased perfusion of the right lower extremity, noted at the post tibial artery level (ABI 0.77) with markedly abnormal monophasic waveform pattern at the ankle. This exam reveals mildly decreased perfusion of the left lower extremity, noted at the dorsalis pedis artery level (ABI 0.93) with normal triphasic waveform at the ankle.  Compared to 11/21/2022, right ABI no significant change, left ABI has improved from 0.60 and monophasic waveform pattern now triphasic. Patient has h/o left SFA atherectomy.

## 2023-05-12 ENCOUNTER — Ambulatory Visit: Payer: PPO | Admitting: Cardiology

## 2023-05-12 ENCOUNTER — Encounter: Payer: Self-pay | Admitting: Cardiology

## 2023-05-12 VITALS — BP 172/64 | HR 71 | Resp 16 | Ht 63.0 in | Wt 128.0 lb

## 2023-05-12 DIAGNOSIS — I739 Peripheral vascular disease, unspecified: Secondary | ICD-10-CM

## 2023-05-12 DIAGNOSIS — E7800 Pure hypercholesterolemia, unspecified: Secondary | ICD-10-CM

## 2023-05-12 DIAGNOSIS — I10 Essential (primary) hypertension: Secondary | ICD-10-CM

## 2023-05-12 NOTE — Patient Instructions (Addendum)
Hold Eliquis 1 day before procedure and day of the procedure.  Please go to [facility] any day to get blood work done fasting for 6 hours

## 2023-05-12 NOTE — Progress Notes (Signed)
Primary Physician/Referring:  Julianne Handler, NP  Patient ID: [name], female    DOB: 1956/09/07, 67 y.o.   MRN: [id number]  Chief Complaint  Patient presents with   PAD   Hypertension   Follow-up    3 month   HPI:    [name]  is a 67 y.o. 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  Since then states that her symptoms of claudication improved significantly in her left leg however she has no dull or worsening symptoms of claudication of the right leg which limits her activities.  She has not noticed any bluish discoloration or rest pain.  States that her hip is also started to hurt.  Denies chest pain, change in dyspnea, orthopnea, PND, leg edema.  Past Medical History:  Diagnosis Date   Acute diverticulitis 03/30/2020   Acute ST elevation myocardial infarction (STEMI) involving other coronary artery of inferior wall (HCC) 09/23/2021   Acute ST elevation myocardial infarction (STEMI) of inferior wall (HCC) 09/22/2021   Asthma    Benign essential HTN    Claudication in peripheral vascular disease (HCC)    Coronary artery disease    Hyperlipidemia    IBS (irritable bowel syndrome)    Migraine headache    Past Surgical History:  Procedure Laterality Date   ABDOMINAL AORTOGRAM W/LOWER EXTREMITY N/A 01/28/2023   Procedure: ABDOMINAL AORTOGRAM W/LOWER EXTREMITY;  Surgeon: Yates Decamp, MD;  Location: [facility];  Service: Cardiovascular;  Laterality: N/A;    CORONARY/GRAFT ACUTE MI REVASCULARIZATION N/A 09/22/2021   Procedure: Coronary/Graft Acute MI Revascularization;  Surgeon: Yates Decamp, MD;  Location: [facility];  Service: Cardiovascular;  Laterality: N/A;   LEFT HEART CATH AND CORONARY ANGIOGRAPHY N/A 09/22/2021   Procedure: LEFT HEART CATH AND CORONARY ANGIOGRAPHY;  Surgeon: Yates Decamp, MD;  Location: [facility];  Service: Cardiovascular;  Laterality: N/A;   PERIPHERAL VASCULAR ATHERECTOMY  01/28/2023   Procedure: PERIPHERAL VASCULAR ATHERECTOMY;  Surgeon: Yates Decamp, MD;  Location: [facility] CV [facility];  Service: Cardiovascular;;   PERIPHERAL VASCULAR BALLOON ANGIOPLASTY  01/28/2023   Procedure: PERIPHERAL VASCULAR BALLOON ANGIOPLASTY;  Surgeon: Yates Decamp, MD;  Location: [facility];  Service: Cardiovascular;;   REVERSE SHOULDER ARTHROPLASTY Left 09/16/2021   Procedure: REVERSE SHOULDER ARTHROPLASTY;  Surgeon: Beverely Low, MD;  Location: [facility];  Service: Orthopedics;  Laterality: Left;   Family History  Problem Relation Age of Onset   Hypertension Mother    Cancer - Other Father        Oropharyngeal SCCa   CAD Brother        Deceased on MI age 64    Social History   Tobacco Use   Smoking status: Some Days    Current packs/day: 1.00    Types: Cigarettes   Smokeless tobacco: Never   Tobacco comments:    >5 cigarettes per day  Substance Use Topics   Alcohol use: Yes    Alcohol/week: 0.0 standard drinks of alcohol    Comment: Occasional to  Rare   Marital Status: Married   ROS  Review of Systems  Cardiovascular:  Positive for claudication. Negative for chest pain, dyspnea on exertion and leg swelling.   Objective  Blood pressure (!) 172/64, pulse 71, resp. rate 16, height 5\' 3"  (1.6 m), weight 128 lb (58.1 kg), SpO2 95%.     05/12/2023   11:44 AM 02/10/2023   11:36 AM 01/28/2023    7:07 PM  Vitals with BMI  Height 5\' 3"  5\' 3"    Weight 128 lbs 126 lbs   BMI 22.68 22.33   Systolic 172 151 962   Diastolic 64 49 46  Pulse 71 75 95      Physical Exam Neck:     Vascular: Carotid bruit (bilateral) present. No JVD.  Cardiovascular:     Rate and Rhythm: Normal rate and regular rhythm.     Pulses:          Popliteal pulses are 0 on the right side and 2+ on the left side.       Dorsalis pedis pulses are 0 on the right side and 0 on the left side.       Posterior tibial pulses are 0 on the right side and 0 on the left side.     Heart sounds: Heart sounds are distant. No murmur heard.    No gallop.  Pulmonary:     Effort: Pulmonary effort is normal.     Breath sounds: Decreased air movement present. Examination of the right-lower field reveals decreased breath sounds. Examination of the left-lower field reveals decreased breath sounds. Decreased breath sounds present.  Abdominal:     General: Bowel sounds are normal.     Palpations: Abdomen is soft.  Musculoskeletal:     Right lower leg: No edema.     Left lower leg: Edema (1-2+ ankle edema pitting) present.    Laboratory examination:   Recent Labs    10/28/22 1340 01/25/23 0910 01/28/23 0639  NA 137 137 140  K 4.6 4.2 3.9  CL 102 99 101  CO2 32 25  --   GLUCOSE 157* 80 106*  BUN 13 11 11   CREATININE 0.92 1.02* 0.80  CALCIUM 8.8* 8.8  --   GFRNONAA >60  --   --       Latest Ref Rng & Units 01/28/2023    6:39 AM 01/25/2023    9:10 AM 10/28/2022    1:40 PM  CMP  Glucose 70 - 99 mg/dL 952  80  841   BUN 8 - 23 mg/dL 11  11  13    Creatinine 0.44 - 1.00 mg/dL 3.24  4.01  0.27   Sodium 135 - 145 mmol/L 140  137  137   Potassium 3.5 - 5.1 mmol/L 3.9  4.2  4.6   Chloride 98 - 111 mmol/L 101  99  102   CO2 20 - 29 mmol/L  25  32   Calcium 8.7 - 10.3 mg/dL  8.8  8.8   Total Protein 6.5 - 8.1 g/dL   5.9   Total Bilirubin 0.3 - 1.2 mg/dL   0.2   Alkaline Phos 38 - 126 U/L   61   AST 15 - 41 U/L   13   ALT 0 - 44 U/L   9       Latest Ref Rng & Units 01/28/2023    6:39 AM 01/25/2023    9:10 AM 10/28/2022    1:40 PM  CBC  WBC 3.4 - 10.8 x10E3/uL  11.2  9.4   Hemoglobin 12.0 - 15.0 g/dL 54.0  98.1  19.1   Hematocrit 36.0 - 46.0 % 35.0  36.8  35.8   Platelets 150 - 450 x10E3/uL  330  284    Lab Results  Component Value Date   CHOL 192 11/19/2022   HDL 77 11/19/2022   LDLCALC 98 11/19/2022   LDLDIRECT 173.8 10/24/2009   TRIG 96 11/19/2022   CHOLHDL 6.0 09/24/2021    HEMOGLOBIN A1C Lab Results  Component Value Date   HGBA1C 6.7 (H) 09/17/2021   MPG 146 09/17/2021   Lab Results  Component Value Date   TSH 2.690 01/25/2023  Total T46.0, normal and T3 uptake minimally reduced at 23 (24-39%), FTI 1.4, normal.  Radiology:   No results found.  Cardiac Studies:   CABG 04/29/2016: LIMA to LAD, SVG to RCA, SVG to OM1, SVG to OM2 for left main 90% stenosis 50% stenosis in circumflex and 60% stenosis in OM1, 50% stenosis in proximal RCA.   Left Heart Catheterization 09/23/21:  RCA diffuse in the proximal and mid RCA with ulceration and scattered 80-99% stenosis.  Successful PCI to ulcerated high grade native RCA with implantation of 3.0x48 mm Synergy SD in the proximal to mid RCA. Timi 3 to 3 flow. LM diffuse 80% stenosis.  LAD patent with mild disease and LIMA to LAD patent.  CX: Moderate OM1 widely patent. Cx severely diseased after origin of OM 1 with 90-95% stenosis.  Small diseased OM-2 RI: Small with ostial mild disease.  SVG to OM1-2 skip graft occluded, SVG to RCA occluded.  LV: EF 35-40% with inferior akinesis. No MR.  Hemodynamics: LV 112/18, EDP 25 mm Hg. Ao 114/80, mean 112 mm Hg. No pressure gradient across the AV.      PCV ECHOCARDIOGRAM COMPLETE 11/23/2021 Left ventricle cavity is normal in size and wall thickness. Abnormal septal wall motion due to post-operative coronary artery bypass graft. Normal LV systolic function with EF 61%. Doppler evidence of grade I (impaired) diastolic dysfunction, normal LAP. No significant valvular abnormality. Normal right atrial pressure.  Compared  to the study 09/23/2021, LVEF 45%, PASP 50 mm Hg, Mod-severe posteriorly directed MR not present.    Carotid artery duplex 06/17/2022: Duplex suggests stenosis in the right internal carotid artery (minimal). Duplex suggests stenosis in the right external carotid artery (<50%). Duplex suggests stenosis in the left internal carotid artery (1-15%). Duplex suggests stenosis in the left external carotid artery (<50%). There is mild heterogeneous plaque noted in the carotid arteries  Antegrade right vertebral artery flow. Antegrade left vertebral artery flow. Follow up appropriate if clinically indicated.  Lower Extremity Arterial Duplex 11/16/2022: There is diffuse plaque noted in the proximal to distal right SFA with dampened monophasic waveform pattern suggesting significant stenosis. Very mild calcification noted.  There is monophasic waveform pattern below right knee suggesting proximal stenosis.  There is diffuse plaque noted in the proximal to distal left SFA with dampened monophasic waveform pattern suggesting significant stenosis. Very mild calcification noted.  There is monophasic waveform pattern below left knee suggesting proximal stenosis.  This exam reveals moderately decreased perfusion of the right lower extremity, noted at the post tibial artery level (ABI0.61) with severely abnormal monophasic waveform pattern at the right ankle level.   This exam reveals moderately decreased perfusion of the left lower extremity, noted at the post tibial artery level (ABI 0.60) with severely abnormal monophasic waveform pattern at the right ankle level.  Peripheral arteriogram and angioplasty 01/28/2023: Abdominal aortogram: Presence of 2 renal arteries 1 on either sides.  There is mild atherosclerotic changes noted in the bilateral renal arteries without high-grade stenosis.  There is moderate diffuse calcification of the abdominal aorta.  Aortoiliac bifurcation widely patent.  Iliac vessels are  widely patent.   Right femoral arteriogram with distal runoff: Right SFA is subtotally occluded all the way from the origin to the distal SFA at the exit site of Hunter's canal.  Below the right knee there appears to be three-vessel runoff.  There is moderate amount of calcification throughout the right SFA.  Distal SFA followed by collaterals from profunda.   Left femoral arteriogram with distal runoff: Left SFA in the proximal and mid segment is subtotally occluded followed by CTO in the mid to distal segment.  Moderate calcification noted.  There is three-vessel runoff below the left knee.  Distal SFA filled by collaterals from profunda.   Successful left SFA orbital atherectomy with 1.5 mm solid crown diamondback catheter followed by drug-coated balloon angioplasty with 250 x 5 mm in the distal segment and 40 x 5 mm In.Pact [facility] with almost 0% distal stenosis with brisk flow and maintenance of three-vessel runoff.     ABI 05/05/2023: This exam reveals moderately decreased perfusion of the right lower extremity, noted at the post tibial artery level (ABI 0.77) with markedly abnormal monophasic waveform pattern at the ankle. This exam reveals mildly decreased perfusion of the left lower extremity, noted at the dorsalis pedis artery level (ABI 0.93) with normal triphasic waveform at the ankle. Compared to 11/21/2022, right ABI no significant change, left ABI has improved from 0.60 and monophasic waveform pattern now triphasic. Patient has h/o left SFA atherectomy.  EKG:   EKG 05/12/2023: Normal sinus rhythm with rate of 71 bpm, left atrial enlargement, normal axis.  Incomplete right bundle branch block.  No evidence of ischemia. No significant change from 11/11/2022.  EKG 09/30/2021: Atrial fibrillation with rapid ventricular response at rate of 130 bpm, normal axis, no evidence of ischemia.  Normal QT interval.  Allergies & Meds   Allergies  Allergen Reactions   Cefepime Anaphylaxis,  Shortness Of Breath, Swelling and Dermatitis   Edoxaban Other (See Comments)    Causes hematomas   Penicillins Anaphylaxis   Barium Iodide Rash   Other     Preservatives  (C-Diff)    Acetaminophen     Will not take after Cyanide deaths, something changed when they mkt'd again.  Can take Childrens Liquid but sometimes upset stomach too   Albuterol Hypertension    ABLE TO TOLERATE IN INHALER FORM NOT NEBULIZER    Beta Adrenergic Blockers     Paradoxical reactions   Prednisone     REACTION: ibs probs   Propofol     REACTION: FEVER   Repatha [Evolocumab] Other (See Comments)    Knocked her out for 10 days   Sulfamethoxazole-Trimethoprim     REACTION: antibiotic induced colitis   Milk (Cow) Other (See Comments)    GI     Current Outpatient Medications:    albuterol (PROVENTIL HFA;VENTOLIN HFA) 108 (90 BASE) MCG/ACT inhaler, Inhale 2 puffs into the lungs 3 (three) times daily as needed for wheezing or shortness of breath., Disp: , Rfl:    apixaban (ELIQUIS) 5 MG TABS tablet, Take 1 tablet (5 mg total) by mouth 2 (two) times daily., Disp: 180 tablet, Rfl: 3   atorvastatin (LIPITOR) 80 MG tablet, Take 80 mg by  mouth daily., Disp: , Rfl:    Budeson-Glycopyrrol-Formoterol (BREZTRI AEROSPHERE) 160-9-4.8 MCG/ACT AERO, Inhale 2 puffs into the lungs in the morning and at bedtime., Disp: 10.7 g, Rfl: 6   bumetanide (BUMEX) 0.5 MG tablet, Take 1 tablet (0.5 mg total) by mouth daily as needed (Leg swelling)., Disp: 30 tablet, Rfl: 2   clopidogrel (PLAVIX) 75 MG tablet, Take 1 tablet (75 mg total) by mouth daily., Disp: 90 tablet, Rfl: 3   cyanocobalamin (VITAMIN B12) 1000 MCG/ML injection, Inject 1,000 mcg as directed every 30 (thirty) days., Disp: , Rfl:    dapagliflozin propanediol (FARXIGA) 10 MG TABS tablet, Take 1 tablet (10 mg total) by mouth daily., Disp: 90 tablet, Rfl: 3   diltiazem (CARDIZEM CD) 120 MG 24 hr capsule, Take 1 capsule (120 mg total) by mouth daily., Disp: 90 capsule, Rfl:  3   diphenoxylate-atropine (LOMOTIL) 2.5-0.025 MG tablet, Take 1 tablet by mouth 2 (two) times daily as needed., Disp: , Rfl:    EPINEPHrine 0.3 mg/0.3 mL IJ SOAJ injection, Inject 0.3 mg into the muscle as needed., Disp: , Rfl:    ezetimibe (ZETIA) 10 MG tablet, Take 1 tablet (10 mg total) by mouth daily., Disp: 90 tablet, Rfl: 3   fluticasone (FLONASE) 50 MCG/ACT nasal spray, Place 1 spray into both nostrils daily., Disp: , Rfl:    losartan (COZAAR) 50 MG tablet, Take 1 tablet (50 mg total) by mouth every evening., Disp: 90 tablet, Rfl: 3   magnesium 30 MG tablet, Take 30 mg by mouth daily., Disp: , Rfl:    metoprolol tartrate (LOPRESSOR) 50 MG tablet, Take 1 tablet (50 mg total) by mouth 2 (two) times daily., Disp: 180 tablet, Rfl: 3   nitroGLYCERIN (NITROSTAT) 0.4 MG SL tablet, Place 0.4 mg under the tongue every 5 (five) minutes as needed for chest pain., Disp: , Rfl:    potassium chloride (KLOR-CON) 10 MEQ tablet, Take 20 mEq by mouth daily as needed (With furosemide)., Disp: , Rfl:    pregabalin (LYRICA) 50 MG capsule, Take 50 mg by mouth 3 (three) times daily., Disp: , Rfl:    promethazine (PHENERGAN) 25 MG suppository, Place 25 mg rectally daily as needed for nausea., Disp: , Rfl:    Vitamin D, Ergocalciferol, (DRISDOL) 1.25 MG (50000 UNIT) CAPS capsule, Take 50,000 Units by mouth once a week., Disp: , Rfl:    Assessment     ICD-10-CM   1. Claudication in peripheral vascular disease (HCC)  I73.9 EKG 12-Lead      CHA2DS2-VASc Score is 5.  Yearly risk of stroke: 7% (F, A, HTN, DM, Vasc Dz).  Score of 1=0.6; 2=2.2; 3=3.2; 4=4.8; 5=7.2; 6=9.8; 7=>9.8) -(CHF; HTN; vasc disease DM,  Female = 1; Age <65 =0; 65-74 = 1,  >75 =2; stroke/embolism= 2).    There are no discontinued medications.     No orders of the defined types were placed in this encounter.   Recommendations:   ALBERTINE LAFOY is a 67 y.o. 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  1. Claudication in peripheral vascular disease (HCC) Patient has had successful directional atherectomy of left SFA on 01/28/2023 and ABI reviewed, excellent result and she has good pulses in the left lower extremity.  She is not extremely symptomatic with regard to right lower extremity pain and also complains of right hip pain which I suspect is probably musculoskeletal or orthopedic issue but she does have clearly severe cramping in her right calf limiting her activity.  Will schedule her for peripheral arteriogram as she request that she would certainly be helped by improving the circulation.   Will schedule for peripheral arteriogram and possible angioplasty given symptoms. Patient understands the risks, benefits, alternatives including medical therapy, CT angiography. Patient understands <1-2% risk of death, embolic complications, bleeding, infection, renal failure, urgent surgical revascularization, but not limited to these and wants to proceed.   She will hold Eliquis 1 day prior and the day of the procedure.  Presently on Plavix along with Eliquis, will probably try to discontinue Plavix after 6 to 8 weeks of therapy if successful in revascularization.  - EKG 12-Lead  2. Pure hypercholesterolemia She needs lipid profile testing.  Lab orders have been placed, she promises to get them done within the next few days.  She is on 80 mg of Lipitor.  3. Essential hypertension Blood pressure is markedly elevated.  Patient states  that her blood pressure at home has been relatively well-controlled between 130 to 140 mmHg systolic which I still feel is high in view of underlying coronary artery disease and peripheral arterial disease.  Patient states that her blood pressure is usually high when she comes here and she was also nearly a wreck this morning while coming to the office hence would like to wait on before making changes.  I will see her back in 6 weeks for follow-up.     Yates Decamp, MD, [facility] 05/12/2023, 12:33 PM Office: [phone] Fax: [phone] Pager: [phone]

## 2023-06-23 ENCOUNTER — Emergency Department (HOSPITAL_COMMUNITY): Payer: PPO

## 2023-06-23 ENCOUNTER — Encounter (HOSPITAL_COMMUNITY): Payer: Self-pay

## 2023-06-23 ENCOUNTER — Other Ambulatory Visit: Payer: Self-pay

## 2023-06-23 ENCOUNTER — Emergency Department (HOSPITAL_COMMUNITY)
Admission: EM | Admit: 2023-06-23 | Discharge: 2023-06-23 | Disposition: A | Discharge status: Home / Self Care | Payer: PPO | Attending: Emergency Medicine | Admitting: Emergency Medicine

## 2023-06-23 DIAGNOSIS — S0990XA Unspecified injury of head, initial encounter: Secondary | ICD-10-CM | POA: Diagnosis present

## 2023-06-23 DIAGNOSIS — J45909 Unspecified asthma, uncomplicated: Secondary | ICD-10-CM | POA: Diagnosis not present

## 2023-06-23 DIAGNOSIS — J449 Chronic obstructive pulmonary disease, unspecified: Secondary | ICD-10-CM | POA: Insufficient documentation

## 2023-06-23 DIAGNOSIS — Z7951 Long term (current) use of inhaled steroids: Secondary | ICD-10-CM | POA: Insufficient documentation

## 2023-06-23 DIAGNOSIS — R410 Disorientation, unspecified: Secondary | ICD-10-CM | POA: Diagnosis not present

## 2023-06-23 DIAGNOSIS — I2510 Atherosclerotic heart disease of native coronary artery without angina pectoris: Secondary | ICD-10-CM | POA: Insufficient documentation

## 2023-06-23 DIAGNOSIS — J181 Lobar pneumonia, unspecified organism: Secondary | ICD-10-CM | POA: Diagnosis not present

## 2023-06-23 DIAGNOSIS — E876 Hypokalemia: Secondary | ICD-10-CM | POA: Diagnosis not present

## 2023-06-23 DIAGNOSIS — J189 Pneumonia, unspecified organism: Secondary | ICD-10-CM

## 2023-06-23 DIAGNOSIS — F17210 Nicotine dependence, cigarettes, uncomplicated: Secondary | ICD-10-CM | POA: Insufficient documentation

## 2023-06-23 DIAGNOSIS — Z7901 Long term (current) use of anticoagulants: Secondary | ICD-10-CM | POA: Diagnosis not present

## 2023-06-23 DIAGNOSIS — W19XXXA Unspecified fall, initial encounter: Secondary | ICD-10-CM | POA: Insufficient documentation

## 2023-06-23 DIAGNOSIS — Z951 Presence of aortocoronary bypass graft: Secondary | ICD-10-CM | POA: Diagnosis not present

## 2023-06-23 LAB — URINALYSIS, ROUTINE W REFLEX MICROSCOPIC
Bilirubin Urine: NEGATIVE
Glucose, UA: >=500 mg/dL — AB
Ketones, ur: NEGATIVE mg/dL
Leukocytes,Ua: NEGATIVE
Nitrite: NEGATIVE
Protein, ur: NEGATIVE mg/dL
Specific Gravity, Urine: 1.001 — ABNORMAL LOW (ref 1.005–1.030)
pH: 6 (ref 5.0–8.0)

## 2023-06-23 LAB — CBC
HCT: 35.5 % — ABNORMAL LOW (ref 36.0–46.0)
Hemoglobin: 10.8 g/dL — ABNORMAL LOW (ref 12.0–15.0)
MCH: 27.3 pg (ref 26.0–34.0)
MCHC: 30.4 g/dL (ref 30.0–36.0)
MCV: 89.6 fL (ref 80.0–100.0)
Platelets: 421 10*3/uL — ABNORMAL HIGH (ref 150–400)
RBC: 3.96 MIL/uL (ref 3.87–5.11)
RDW: 16.9 % — ABNORMAL HIGH (ref 11.5–15.5)
WBC: 15.2 10*3/uL — ABNORMAL HIGH (ref 4.0–10.5)
nRBC: 0 % (ref 0.0–0.2)

## 2023-06-23 LAB — I-STAT VENOUS BLOOD GAS, ED
Acid-Base Excess: 11 mmol/L — ABNORMAL HIGH (ref 0.0–2.0)
Bicarbonate: 35.3 mmol/L — ABNORMAL HIGH (ref 20.0–28.0)
Calcium, Ion: 1.07 mmol/L — ABNORMAL LOW (ref 1.15–1.40)
HCT: 34 % — ABNORMAL LOW (ref 36.0–46.0)
Hemoglobin: 11.6 g/dL — ABNORMAL LOW (ref 12.0–15.0)
O2 Saturation: 95 %
Potassium: 2.8 mmol/L — ABNORMAL LOW (ref 3.5–5.1)
Sodium: 137 mmol/L (ref 135–145)
TCO2: 37 mmol/L — ABNORMAL HIGH (ref 22–32)
pCO2, Ven: 42.5 mmHg — ABNORMAL LOW (ref 44–60)
pH, Ven: 7.527 — ABNORMAL HIGH (ref 7.25–7.43)
pO2, Ven: 66 mmHg — ABNORMAL HIGH (ref 32–45)

## 2023-06-23 LAB — COMPREHENSIVE METABOLIC PANEL
ALT: 22 U/L (ref 0–44)
AST: 25 U/L (ref 15–41)
Albumin: 3.5 g/dL (ref 3.5–5.0)
Alkaline Phosphatase: 71 U/L (ref 38–126)
Anion gap: 12 (ref 5–15)
BUN: 12 mg/dL (ref 8–23)
CO2: 30 mmol/L (ref 22–32)
Calcium: 8.3 mg/dL — ABNORMAL LOW (ref 8.9–10.3)
Chloride: 90 mmol/L — ABNORMAL LOW (ref 98–111)
Creatinine, Ser: 1.14 mg/dL — ABNORMAL HIGH (ref 0.44–1.00)
GFR, Estimated: 53 mL/min — ABNORMAL LOW (ref >60)
Glucose, Bld: 147 mg/dL — ABNORMAL HIGH (ref 70–99)
Potassium: 2.8 mmol/L — ABNORMAL LOW (ref 3.5–5.1)
Sodium: 132 mmol/L — ABNORMAL LOW (ref 135–145)
Total Bilirubin: 0.9 mg/dL (ref 0.3–1.2)
Total Protein: 6.7 g/dL (ref 6.5–8.1)

## 2023-06-23 MED ORDER — POTASSIUM CHLORIDE CRYS ER 20 MEQ PO TBCR
40.0000 meq | EXTENDED_RELEASE_TABLET | Freq: Once | ORAL | Status: AC
  Administered 2023-06-23: 40 meq via ORAL
  Filled 2023-06-23: qty 2

## 2023-06-23 MED ORDER — POTASSIUM CHLORIDE 10 MEQ/100ML IV SOLN: 10.0000 meq | INTRAVENOUS | Status: AC

## 2023-06-23 MED ORDER — AZITHROMYCIN 250 MG PO TABS: ORAL_TABLET | ORAL | 0 refills | Status: DC

## 2023-06-23 MED ORDER — MAGNESIUM OXIDE -MG SUPPLEMENT 400 (240 MG) MG PO TABS
800.0000 mg | ORAL_TABLET | Freq: Once | ORAL | Status: AC
  Administered 2023-06-23: 800 mg via ORAL
  Filled 2023-06-23: qty 2

## 2023-06-23 NOTE — ED Triage Notes (Addendum)
Family reports she's had a fall approx a week ago.  Family reports she was found on the floor confused.  Patient is on blood thinners and has had increase confusion. Went to PCP and was told to come here to be evaluated.  Patient doesn't remember the fall Reports bloody nose on and off for the past couple days.  Patient wears oxygen at night only.

## 2023-06-23 NOTE — ED Notes (Signed)
Updated pt, per case management Winnie, Pt will have walker delivered to house. Pt and pt daughter verbalized understanding.

## 2023-06-23 NOTE — Discharge Instructions (Addendum)
As we discussed, your potassium is low so please take your potassium as prescribed by your doctor.  You need to have your potassium rechecked in a week with your doctor  You have a small area of pneumonia.  Please take Z-Pak as prescribed  Your walker will be delivered to your home tomorrow  See your doctor for follow-up  Return to ER if you have fever or cough or another fall

## 2023-06-23 NOTE — ED Provider Notes (Signed)
  Physical Exam  BP (!) 142/84   Pulse 85   Temp 98 F (36.7 C) (Oral)   Resp (!) 23   Ht 5\' 3"  (1.6 m)   Wt 58.1 kg   SpO2 98%   BMI 22.67 kg/m   Physical Exam  Procedures  Procedures  ED Course / MDM    Medical Decision Making Care assumed at 3 PM.  Patient is here with confusion and fall.  Signout pending urinalysis and likely discharge.  5:26 PM I reviewed patient's labs and independently interpreted imaging studies.  Labs show white count of 15.  Urinalysis is normal.  CT cervical spine did pick up an area of possible pneumonia.  Chest x-ray did not show any obvious pneumonia however.  Patient does have bronchitis and COPD at baseline.  Patient also had C. difficile previously.  We discussed antibiotic options.  She states that she usually tolerates Z-Pak very well.  I also discussed that her potassium is low at 2.8.  She received p.o. potassium in the ED.  She states that she has potassium at home and I encouraged her to take it and get it rechecked with her doctor in a week  Problems Addressed: Community acquired pneumonia of right upper lobe of lung: acute illness or injury Hypokalemia: acute illness or injury  Amount and/or Complexity of Data Reviewed Labs: ordered. Decision-making details documented in ED Course. Radiology: ordered and independent interpretation performed.  Risk OTC drugs. Prescription drug management.         Charlynne Pander, MD 06/23/23 [phone]

## 2023-06-23 NOTE — ED Notes (Signed)
Walker delivered to pt at this time

## 2023-06-23 NOTE — Care Management (Addendum)
Transition of Care [facility]) - Emergency Department Mini Assessment   Patient Details  Name: [name] MRN: [id number] Date of Birth: 04-09-1956  Transition of Care [facility] CM/SW Contact:    Lavenia Atlas, RN Phone Number: 06/23/2023, 4:25 PM   Clinical Narrative: Received call from Nehemiah Settle RN who request a walker for patient. Per chart review no TOC consult on file however DME orders on file.  This RNCM spoke with patient's husband [name] to offer choice, family chose [facility]. This RNCM spoke with Zack w/ [facility] to refer for DME: rolling walker. Zack reports request is in process and will deliver to bedside prior to discharge.  No additional TOC needs at this time.  - 5:24pm This RNCM spoke with Nehemiah Settle RN who reports DME: rolling walker has not been delivered. Patient and family are agreeable to have walker delivered to home address on file. This RNCM left voicemail for Zack and text message to advise patient is being discharged and DME needs to be delivered to her home.    ED Mini Assessment: What brought you to the Emergency Department? : fall approx one week ago  Barriers to Discharge: Continued Medical Work up  [employer] interventions: coordinated DME: rolling walker  Means of departure: Car  Interventions which prevented an admission or readmission: DME Provided    Patient Contact and Communications     Spoke with: [name] (husband) Contact Date: 06/23/23,     Contact Phone Number: [phone] Call outcome: agreed to DME  Patient states their goals for this hospitalization and ongoing recovery are:: return home CMS Medicare.gov Compare Post Acute Care list provided to:: Patient Represenative (must comment) Choice offered to / list presented to : Patient, Spouse  Admission diagnosis:  nose bleed: fall last week on thinners Patient Active Problem List   Diagnosis Date Noted   H/O total shoulder replacement, left 09/16/2021   COPD with chronic bronchitis  03/30/2020   Sepsis (HCC) 03/30/2020   Diverticulitis 03/30/2020   Anaphylactic reaction 03/23/2020   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   Renal artery stenosis, non-flow-limiting (HCC) 06/14/2016   Coronary artery disease involving native coronary artery of native heart with unstable angina pectoris (HCC) 04/29/2016   Palpitations 04/22/2016   Overweight (BMI 25.0-29.9) 01/22/2015   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   Essential hypertension 01/19/2015   Chronic pain syndrome 01/19/2015   GASTROINTESTINAL XRAY, ABNORMAL 01/05/2010   GERD 12/25/2009   BREAST MASS 10/24/2009   ANXIETY DEPRESSION 09/17/2009   WEIGHT LOSS 09/17/2009   ABDOMINAL PAIN -GENERALIZED 09/03/2009   CLOSTRIDIUM DIFFICILE COLITIS 06/16/2009   BACK PAIN, LUMBAR 06/09/2009   PERIPHERAL NEUROPATHY 05/28/2009   CONTUSION, HIP, RIGHT 05/28/2009   SEBACEOUS CYST 02/13/2009   BACK PAIN, CHRONIC 02/13/2009   DYSPLASTIC NEVUS, BACK 09/18/2008   ANXIETY 08/28/2008   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   DEGENERATIVE DISC DISEASE, 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   OTHER DYSPHAGIA 10/30/2007   IRRITABLE BOWEL SYNDROME 09/29/2007   TOBACCO ABUSE 07/17/2007   Migraine 07/17/2007   Hyperlipidemia 03/22/2007   PERIPHERAL VASCULAR DISEASE 03/22/2007   ASTHMA 03/22/2007   DIVERTICULOSIS, COLON 03/22/2007   PCP:  Julianne Handler, NP Pharmacy:   [facility] - [place], [place] - [address] [place] [address] Phone: [phone] Fax: [phone]  [id number] [place], [place] - [address] AT [facility] OF [address] [place] [address] Phone: [phone] Fax: [phone]

## 2023-06-23 NOTE — ED Provider Notes (Signed)
[place] EMERGENCY DEPARTMENT AT [facility] Provider Note  CSN: [id number] Arrival date & time: 06/23/23 1229  Chief Complaint(s) Fall  HPI [name] is a 67 y.o. female with PMH CAD status post MI, peripheral arterial disease with claudication, COPD on 4 L home O2 who presents emergency department for evaluation of a fall with intermittent memory difficulties.  Patient states that approximately 1 week ago she had a fall and struck the back of her head on the ground.  Patient had persistent confusion with repetitive questioning for approximately 3 hours after this event and this is since improved but she is still does have repetitive questioning.  Patient states that she had some mild memory difficulties prior to this fall but feels that it may be worsening afterwards.  She saw her primary care physician this morning who transferred the patient to the emergency department for CT imaging given that patient had a fall on blood thinners with confusion.  Currently, patient denying numbness, tingling, weakness or any neurologic complaints.  Denies chest pain, shortness of breath, headache, fever or other systemic symptoms.   Past Medical History Past Medical History:  Diagnosis Date   Acute diverticulitis 03/30/2020   Acute ST elevation myocardial infarction (STEMI) involving other coronary artery of inferior wall (HCC) 09/23/2021   Acute ST elevation myocardial infarction (STEMI) of inferior wall (HCC) 09/22/2021   Asthma    Benign essential HTN    Claudication in peripheral vascular disease (HCC)    Coronary artery disease    Hyperlipidemia    IBS (irritable bowel syndrome)    Migraine headache    Patient Active Problem List   Diagnosis Date Noted   H/O total shoulder replacement, left 09/16/2021   COPD with chronic bronchitis 03/30/2020   Sepsis (HCC) 03/30/2020   Diverticulitis 03/30/2020   Anaphylactic reaction 03/23/2020   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   Renal artery  stenosis, non-flow-limiting (HCC) 06/14/2016   Coronary artery disease involving native coronary artery of native heart with unstable angina pectoris (HCC) 04/29/2016   Palpitations 04/22/2016   Overweight (BMI 25.0-29.9) 01/22/2015   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   Essential hypertension 01/19/2015   Chronic pain syndrome 01/19/2015   GASTROINTESTINAL XRAY, ABNORMAL 01/05/2010   GERD 12/25/2009   BREAST MASS 10/24/2009   ANXIETY DEPRESSION 09/17/2009   WEIGHT LOSS 09/17/2009   ABDOMINAL PAIN -GENERALIZED 09/03/2009   CLOSTRIDIUM DIFFICILE COLITIS 06/16/2009   BACK PAIN, LUMBAR 06/09/2009   PERIPHERAL NEUROPATHY 05/28/2009   CONTUSION, HIP, RIGHT 05/28/2009   SEBACEOUS CYST 02/13/2009   BACK PAIN, CHRONIC 02/13/2009   DYSPLASTIC NEVUS, BACK 09/18/2008   ANXIETY 08/28/2008   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 Authorizing Provider  azithromycin (ZITHROMAX Z-PAK) 250 MG tablet 2 po day one, then 1 daily x 4 days 06/23/23  Yes Charlynne Pander, MD  albuterol (PROVENTIL HFA;VENTOLIN HFA) 108 (90 BASE) MCG/ACT inhaler Inhale 2 puffs into the lungs 3 (three) times daily as needed for wheezing or shortness of breath.    [provider]  apixaban (ELIQUIS) 5 MG TABS tablet Take 1 tablet (5 mg total) by mouth 2 (two) times  daily. 01/30/23   Yates Decamp, MD  atorvastatin (LIPITOR) 80 MG tablet Take 80 mg by mouth daily.    [provider]  Budeson-Glycopyrrol-Formoterol (BREZTRI AEROSPHERE) 160-9-4.8 MCG/ACT AERO Inhale 2 puffs into the lungs in the morning and at bedtime.  11/23/22   Martina Sinner, MD  clopidogrel (PLAVIX) 75 MG tablet Take 1 tablet (75 mg total) by mouth daily. 10/14/21   Cantwell, Celeste C, PA-C  cyanocobalamin (VITAMIN B12) 1000 MCG/ML injection Inject 1,000 mcg as directed every 30 (thirty) days.    [provider]  dapagliflozin propanediol (FARXIGA) 10 MG TABS tablet Take 1 tablet (10 mg total) by mouth daily. 10/14/21   Cantwell, Celeste C, PA-C  diltiazem (CARDIZEM CD) 120 MG 24 hr capsule Take 1 capsule (120 mg total) by mouth daily. 10/14/21   Cantwell, Celeste C, PA-C  diphenoxylate-atropine (LOMOTIL) 2.5-0.025 MG tablet Take 1 tablet by mouth 2 (two) times daily as needed for diarrhea or loose stools. 04/13/23   [provider]  EPINEPHrine 0.3 mg/0.3 mL IJ SOAJ injection Inject 0.3 mg into the muscle as needed for anaphylaxis. 05/10/23   [provider]  ezetimibe (ZETIA) 10 MG tablet Take 1 tablet (10 mg total) by mouth daily. 01/04/23 12/30/23  Yates Decamp, MD  ferrous sulfate 325 (65 FE) MG tablet Take 325 mg by mouth 3 (three) times a week.    [provider]  fluticasone (FLONASE) 50 MCG/ACT nasal spray Place 1 spray into both nostrils daily. 04/12/23   [provider]  furosemide (LASIX) 20 MG tablet Take 20 mg by mouth daily as needed for fluid or edema.    [provider]  losartan (COZAAR) 50 MG tablet Take 1 tablet (50 mg total) by mouth every evening. 02/10/23 02/05/24  Yates Decamp, MD  Magnesium 400 MG TABS Take 400 mg by mouth daily.    [provider]  metoprolol tartrate (LOPRESSOR) 50 MG tablet Take 1 tablet (50 mg total) by mouth 2 (two) times daily. 10/14/21   Cantwell, Celeste C, PA-C  nitroGLYCERIN (NITROSTAT) 0.4 MG SL tablet Place 0.4 mg under the tongue every 5 (five) minutes as needed for chest pain.    [provider]  potassium chloride SA (KLOR-CON M) 20 MEQ tablet Take 20 mEq by mouth daily as needed (Takes with Lasix). 06/01/23   [provider]  pregabalin (LYRICA) 50 MG capsule Take 50 mg by mouth 3 (three) times daily.    [provider]  promethazine (PHENERGAN) 12.5 MG tablet Take 12.5 mg by mouth 3 (three) times daily as needed for nausea or vomiting. 05/27/23   [provider]  promethazine (PHENERGAN) 25 MG suppository Place 25 mg rectally daily as needed for nausea. 09/14/21   [provider]  Vitamin D, Ergocalciferol, (DRISDOL) 1.25 MG (50000 UNIT) CAPS capsule Take 50,000 Units by mouth once a week. 08/31/21   [provider]  Past Surgical History Past Surgical History:  Procedure Laterality Date   ABDOMINAL AORTOGRAM W/LOWER EXTREMITY N/A 01/28/2023   Procedure: ABDOMINAL AORTOGRAM W/LOWER EXTREMITY;  Surgeon: Yates Decamp, MD;  Location: [facility] IN[facility];  Service: Cardiovascular;  Laterality: N/A;   CORONARY/GRAFT ACUTE MI REVASCULARIZATION N/A 09/22/2021   Procedure: Coronary/Graft Acute MI Revascularization;  Surgeon: Yates Decamp, MD;  Location: [facility];  Service: Cardiovascular;  Laterality: N/A;   LEFT HEART CATH AND CORONARY ANGIOGRAPHY N/A 09/22/2021   Procedure: LEFT HEART CATH AND CORONARY ANGIOGRAPHY;  Surgeon: Yates Decamp, MD;  Location: [facility];  Service: Cardiovascular;  Laterality: N/A;   PERIPHERAL VASCULAR ATHERECTOMY  01/28/2023   Procedure: PERIPHERAL VASCULAR ATHERECTOMY;  Surgeon: Yates Decamp, MD;  Location: [facility] INVASIVE CV LAB;  Service: Cardiovascular;;   PERIPHERAL VASCULAR BALLOON ANGIOPLASTY  01/28/2023   Procedure: PERIPHERAL VASCULAR BALLOON ANGIOPLASTY;  Surgeon: Yates Decamp, MD;  Location: [facility];  Service: Cardiovascular;;   REVERSE SHOULDER ARTHROPLASTY Left 09/16/2021   Procedure: REVERSE SHOULDER ARTHROPLASTY;  Surgeon: Beverely Low, MD;  Location: [facility];  Service: Orthopedics;   Laterality: Left;   Family History Family History  Problem Relation Age of Onset   Hypertension Mother    Cancer - Other Father        Oropharyngeal SCCa   CAD Brother        Deceased on MI age 46    Social History Social History   Tobacco Use   Smoking status: Some Days    Current packs/day: 1.00    Types: Cigarettes   Smokeless tobacco: Never   Tobacco comments:    >5 cigarettes per day  Vaping Use   Vaping status: Never Used  Substance Use Topics   Alcohol use: Yes    Alcohol/week: 0.0 standard drinks of alcohol    Comment: Occasional to Rare   Drug use: No   Allergies Cefepime, Edoxaban, Penicillins, Barium iodide, Other, Acetaminophen, Albuterol, Bactrim [sulfamethoxazole-trimethoprim], Beta adrenergic blockers, Prednisone, Propofol, and Repatha [evolocumab]  Review of Systems Review of Systems  Psychiatric/Behavioral:  Positive for confusion.     Physical Exam Vital Signs  I have reviewed the triage vital signs BP (!) 142/84   Pulse 85   Temp 98 F (36.7 C) (Oral)   Resp (!) 23   Ht 5\' 3"  (1.6 m)   Wt 58.1 kg   SpO2 98%   BMI 22.67 kg/m   Physical Exam Vitals and nursing note reviewed.  Constitutional:      General: She is not in acute distress.    Appearance: She is well-developed.  HENT:     Head: Normocephalic and atraumatic.  Eyes:     Conjunctiva/sclera: Conjunctivae normal.  Cardiovascular:     Rate and Rhythm: Normal rate and regular rhythm.     Heart sounds: No murmur heard. Pulmonary:     Effort: Pulmonary effort is normal. No respiratory distress.     Breath sounds: Normal breath sounds.  Abdominal:     Palpations: Abdomen is soft.     Tenderness: There is no abdominal tenderness.  Musculoskeletal:        General: No swelling.     Cervical back: Neck supple.  Skin:    General: Skin is warm and dry.     Capillary Refill: Capillary refill takes less than 2 seconds.  Neurological:     General: No focal deficit present.      Mental Status: She is alert.  Cranial Nerves: No cranial nerve deficit.     Sensory: No sensory deficit.     Motor: No weakness.  Psychiatric:        Mood and Affect: Mood normal.     ED Results and Treatments Labs (all labs ordered are listed, but only abnormal results are displayed) Labs Reviewed  COMPREHENSIVE METABOLIC PANEL - Abnormal; Notable for the following components:      Result Value   Sodium 132 (*)    Potassium 2.8 (*)    Chloride 90 (*)    Glucose, Bld 147 (*)    Creatinine, Ser 1.14 (*)    Calcium 8.3 (*)    GFR, Estimated 53 (*)    All other components within normal limits  CBC - Abnormal; Notable for the following components:   WBC 15.2 (*)    Hemoglobin 10.8 (*)    HCT 35.5 (*)    RDW 16.9 (*)    Platelets 421 (*)    All other components within normal limits  URINALYSIS, ROUTINE W REFLEX MICROSCOPIC - Abnormal; Notable for the following components:   Color, Urine COLORLESS (*)    Specific Gravity, Urine 1.001 (*)    Glucose, UA >=500 (*)    Hgb urine dipstick SMALL (*)    Bacteria, UA RARE (*)    All other components within normal limits  I-STAT VENOUS BLOOD GAS, ED - Abnormal; Notable for the following components:   pH, Ven 7.527 (*)    pCO2, Ven 42.5 (*)    pO2, Ven 66 (*)    Bicarbonate 35.3 (*)    TCO2 37 (*)    Acid-Base Excess 11.0 (*)    Potassium 2.8 (*)    Calcium, Ion 1.07 (*)    HCT 34.0 (*)    Hemoglobin 11.6 (*)    All other components within normal limits  BLOOD GAS, VENOUS  CBG MONITORING, ED                                                                                                                          Radiology DG Chest Portable 1 View  Result Date: 06/23/2023 CLINICAL DATA:  Pneumonia EXAM: PORTABLE CHEST 1 VIEW COMPARISON:  X-ray 05/31/2023 FINDINGS: Sternal wires. No consolidation, pneumothorax or effusion. No edema. Normal cardiopericardial silhouette. Calcified aorta. Overlapping cardiac leads. Left shoulder  arthroplasty. Coronary artery stents are suggested. IMPRESSION: Postop chest.  No acute cardiopulmonary disease. Electronically Signed   By: Karen Kays M.D.   On: 06/23/2023 15:42   CT Head Wo Contrast  Result Date: 06/23/2023 CLINICAL DATA:  Trauma EXAM: CT HEAD WITHOUT CONTRAST CT MAXILLOFACIAL WITHOUT CONTRAST CT CERVICAL SPINE WITHOUT CONTRAST TECHNIQUE: Multidetector CT imaging of the head, cervical spine, and maxillofacial structures were performed using the standard protocol without intravenous contrast. Multiplanar CT image reconstructions of the cervical spine and maxillofacial structures were also generated. RADIATION DOSE REDUCTION: This exam was performed according to the departmental dose-optimization program which includes automated exposure  control, adjustment of the mA and/or kV according to patient size and/or use of iterative reconstruction technique. COMPARISON:  None Available. FINDINGS: CT HEAD FINDINGS Brain: No evidence of acute infarction, hemorrhage, hydrocephalus, extra-axial collection or mass lesion/mass effect. Vascular: No hyperdense vessel or unexpected calcification. Skull: Normal. Negative for fracture or focal lesion. Other: None. CT MAXILLOFACIAL FINDINGS Osseous: No fracture or mandibular dislocation. No destructive process. Orbits: Bilateral lens replacement. Otherwise no evidence of traumatic injury. Sinuses: No middle ear or mastoid effusion. Complete opacification of the left maxillary sinus. Soft tissues: Negative. CT CERVICAL SPINE FINDINGS Alignment: Grade 1 anterolisthesis of C3 on C4 and C4 on C5. Trace retrolisthesis of C5 on C6. Skull base and vertebrae: No acute fracture. No primary bone lesion or focal pathologic process. Soft tissues and spinal canal: No prevertebral fluid or swelling. No visible canal hematoma. Disc levels:  No evidence of high-grade spinal canal stenosis. Upper chest: Clustered nodularity in the right upper lobe and bronchial wall thickening,  favored to be infectious or inflammatory. Other: None IMPRESSION: 1. No acute intracranial abnormality. 2. No acute facial bone fracture. 3. No acute fracture or traumatic malalignment of the cervical spine. 4. Clustered nodularity in the right upper lobe and bronchial wall thickening, favored to be infectious or inflammatory. Electronically Signed   By: Lorenza Cambridge M.D.   On: 06/23/2023 14:29   CT Maxillofacial Wo Contrast  Result Date: 06/23/2023 CLINICAL DATA:  Trauma EXAM: CT HEAD WITHOUT CONTRAST CT MAXILLOFACIAL WITHOUT CONTRAST CT CERVICAL SPINE WITHOUT CONTRAST TECHNIQUE: Multidetector CT imaging of the head, cervical spine, and maxillofacial structures were performed using the standard protocol without intravenous contrast. Multiplanar CT image reconstructions of the cervical spine and maxillofacial structures were also generated. RADIATION DOSE REDUCTION: This exam was performed according to the departmental dose-optimization program which includes automated exposure control, adjustment of the mA and/or kV according to patient size and/or use of iterative reconstruction technique. COMPARISON:  None Available. FINDINGS: CT HEAD FINDINGS Brain: No evidence of acute infarction, hemorrhage, hydrocephalus, extra-axial collection or mass lesion/mass effect. Vascular: No hyperdense vessel or unexpected calcification. Skull: Normal. Negative for fracture or focal lesion. Other: None. CT MAXILLOFACIAL FINDINGS Osseous: No fracture or mandibular dislocation. No destructive process. Orbits: Bilateral lens replacement. Otherwise no evidence of traumatic injury. Sinuses: No middle ear or mastoid effusion. Complete opacification of the left maxillary sinus. Soft tissues: Negative. CT CERVICAL SPINE FINDINGS Alignment: Grade 1 anterolisthesis of C3 on C4 and C4 on C5. Trace retrolisthesis of C5 on C6. Skull base and vertebrae: No acute fracture. No primary bone lesion or focal pathologic process. Soft tissues and  spinal canal: No prevertebral fluid or swelling. No visible canal hematoma. Disc levels:  No evidence of high-grade spinal canal stenosis. Upper chest: Clustered nodularity in the right upper lobe and bronchial wall thickening, favored to be infectious or inflammatory. Other: None IMPRESSION: 1. No acute intracranial abnormality. 2. No acute facial bone fracture. 3. No acute fracture or traumatic malalignment of the cervical spine. 4. Clustered nodularity in the right upper lobe and bronchial wall thickening, favored to be infectious or inflammatory. Electronically Signed   By: Lorenza Cambridge M.D.   On: 06/23/2023 14:29   CT Cervical Spine Wo Contrast  Result Date: 06/23/2023 CLINICAL DATA:  Trauma EXAM: CT HEAD WITHOUT CONTRAST CT MAXILLOFACIAL WITHOUT CONTRAST CT CERVICAL SPINE WITHOUT CONTRAST TECHNIQUE: Multidetector CT imaging of the head, cervical spine, and maxillofacial structures were performed using the standard protocol without intravenous contrast. Multiplanar CT image reconstructions  of the cervical spine and maxillofacial structures were also generated. RADIATION DOSE REDUCTION: This exam was performed according to the departmental dose-optimization program which includes automated exposure control, adjustment of the mA and/or kV according to patient size and/or use of iterative reconstruction technique. COMPARISON:  None Available. FINDINGS: CT HEAD FINDINGS Brain: No evidence of acute infarction, hemorrhage, hydrocephalus, extra-axial collection or mass lesion/mass effect. Vascular: No hyperdense vessel or unexpected calcification. Skull: Normal. Negative for fracture or focal lesion. Other: None. CT MAXILLOFACIAL FINDINGS Osseous: No fracture or mandibular dislocation. No destructive process. Orbits: Bilateral lens replacement. Otherwise no evidence of traumatic injury. Sinuses: No middle ear or mastoid effusion. Complete opacification of the left maxillary sinus. Soft tissues: Negative. CT  CERVICAL SPINE FINDINGS Alignment: Grade 1 anterolisthesis of C3 on C4 and C4 on C5. Trace retrolisthesis of C5 on C6. Skull base and vertebrae: No acute fracture. No primary bone lesion or focal pathologic process. Soft tissues and spinal canal: No prevertebral fluid or swelling. No visible canal hematoma. Disc levels:  No evidence of high-grade spinal canal stenosis. Upper chest: Clustered nodularity in the right upper lobe and bronchial wall thickening, favored to be infectious or inflammatory. Other: None IMPRESSION: 1. No acute intracranial abnormality. 2. No acute facial bone fracture. 3. No acute fracture or traumatic malalignment of the cervical spine. 4. Clustered nodularity in the right upper lobe and bronchial wall thickening, favored to be infectious or inflammatory. Electronically Signed   By: Lorenza Cambridge M.D.   On: 06/23/2023 14:29    Pertinent labs & imaging results that were available during my care of the patient were reviewed by me and considered in my medical decision making (see MDM for details).  Medications Ordered in ED Medications  potassium chloride 10 mEq in 100 mL IVPB (10 mEq Intravenous Not Given 06/23/23 1538)  potassium chloride SA (KLOR-CON M) CR tablet 40 mEq (40 mEq Oral Given 06/23/23 1457)  magnesium oxide (MAG-OX) tablet 800 mg (800 mg Oral Given 06/23/23 1457)                                                                                                                                     Procedures Procedures  (including critical care time)  Medical Decision Making / ED Course   This patient presents to the ED for concern of fall, confusion, this involves an extensive number of treatment options, and is a complaint that carries with it a high risk of complications and morbidity.  The differential diagnosis includes closed head injury, concussion, ICH, skull fracture, electrolyte abnormality, UTI, hypercarbia  MDM: Patient seen in the emergency room for  evaluation of a closed head injury on blood thinners, confusion.  Physical exam is large unremarkable with no external evidence of trauma.  Neurologic exam unremarkable with no focal motor or sensory deficits, no cranial nerve deficits.  No abnormal gait.  Laboratory evaluation with some mild hypokalemia to  2.8 which was repleted with oral potassium and magnesium in the emergency department.  Leukocytosis to 15.2.  CT head, max face and C-spine is negative for acute traumatic injury.  Incidental right upper lobe consolidation seen on C-spine CT that may be consistent with pneumonia.  At time of signout, patient pending chest x-ray and urinalysis.  Regardless of findings, patient likely safe for discharge but she should follow-up with outpatient neurologist given her reports of worsening memory difficulties.  Suspect patient likely suffered a concussion from her fall.  Please see provider signout continuation of workup.   Additional history obtained: -Additional history obtained from daughter -External records from outside source obtained and reviewed including: Chart review including previous notes, labs, imaging, consultation notes   Lab Tests: -I ordered, reviewed, and interpreted labs.   The pertinent results include:   Labs Reviewed  COMPREHENSIVE METABOLIC PANEL - Abnormal; Notable for the following components:      Result Value   Sodium 132 (*)    Potassium 2.8 (*)    Chloride 90 (*)    Glucose, Bld 147 (*)    Creatinine, Ser 1.14 (*)    Calcium 8.3 (*)    GFR, Estimated 53 (*)    All other components within normal limits  CBC - Abnormal; Notable for the following components:   WBC 15.2 (*)    Hemoglobin 10.8 (*)    HCT 35.5 (*)    RDW 16.9 (*)    Platelets 421 (*)    All other components within normal limits  URINALYSIS, ROUTINE W REFLEX MICROSCOPIC - Abnormal; Notable for the following components:   Color, Urine COLORLESS (*)    Specific Gravity, Urine 1.001 (*)    Glucose, UA  >=500 (*)    Hgb urine dipstick SMALL (*)    Bacteria, UA RARE (*)    All other components within normal limits  I-STAT VENOUS BLOOD GAS, ED - Abnormal; Notable for the following components:   pH, Ven 7.527 (*)    pCO2, Ven 42.5 (*)    pO2, Ven 66 (*)    Bicarbonate 35.3 (*)    TCO2 37 (*)    Acid-Base Excess 11.0 (*)    Potassium 2.8 (*)    Calcium, Ion 1.07 (*)    HCT 34.0 (*)    Hemoglobin 11.6 (*)    All other components within normal limits  BLOOD GAS, VENOUS  CBG MONITORING, ED      EKG   EKG Interpretation Date/Time:  Thursday June 23 2023 12:38:22 EDT Ventricular Rate:  85 PR Interval:  158 QRS Duration:  86 QT Interval:  376 QTC Calculation: 447 R Axis:   55  Text Interpretation: Sinus rhythm with sinus arrhythmia with frequent Premature ventricular complexes ST & T wave abnormality, consider anterolateral ischemia Abnormal ECG When compared with ECG of 02-Oct-2021 04:41, PREVIOUS ECG IS PRESENT Confirmed by Richardean Canal [phone]) on 06/23/2023 4:43:46 PM         Imaging Studies ordered: I ordered imaging studies including CT head, C-spine, max face I independently visualized and interpreted imaging. I agree with the radiologist interpretation  Chest x-ray pending   Medicines ordered and prescription drug management: Meds ordered this encounter  Medications   potassium chloride SA (KLOR-CON M) CR tablet 40 mEq   magnesium oxide (MAG-OX) tablet 800 mg   potassium chloride 10 mEq in 100 mL IVPB   azithromycin (ZITHROMAX Z-PAK) 250 MG tablet    Sig: 2 po day one, then  1 daily x 4 days    Dispense:  6 tablet    Refill:  0    -I have reviewed the patients home medicines and have made adjustments as needed  Critical interventions none    Cardiac Monitoring: The patient was maintained on a cardiac monitor.  I personally viewed and interpreted the cardiac monitored which showed an underlying rhythm of: NSr  Social Determinants of Health:  Factors  impacting patients care include: none   Reevaluation: After the interventions noted above, I reevaluated the patient and found that they have :stayed the same  Co morbidities that complicate the patient evaluation  Past Medical History:  Diagnosis Date   Acute diverticulitis 03/30/2020   Acute ST elevation myocardial infarction (STEMI) involving other coronary artery of inferior wall (HCC) 09/23/2021   Acute ST elevation myocardial infarction (STEMI) of inferior wall (HCC) 09/22/2021   Asthma    Benign essential HTN    Claudication in peripheral vascular disease (HCC)    Coronary artery disease    Hyperlipidemia    IBS (irritable bowel syndrome)    Migraine headache       Dispostion: I considered admission for this patient, and patient likely will be safe for discharge but disposition pending completion of imaging studies and urinalysis.  Please see provider signout for continuation of workup     Final Clinical Impression(s) / ED Diagnoses Final diagnoses:  Injury of head, initial encounter  Community acquired pneumonia of right upper lobe of lung  Hypokalemia     @PCDICTATION @    Glendora Score, MD 06/23/23 1749

## 2023-06-24 ENCOUNTER — Encounter (HOSPITAL_COMMUNITY): Admission: RE | Payer: Self-pay | Source: Home / Self Care

## 2023-06-24 ENCOUNTER — Ambulatory Visit (HOSPITAL_COMMUNITY): Admission: RE | Admit: 2023-06-24 | Payer: PPO | Source: Home / Self Care | Admitting: Cardiology

## 2023-06-24 SURGERY — LOWER EXTREMITY ANGIOGRAPHY
Anesthesia: LOCAL

## 2023-06-27 ENCOUNTER — Telehealth: Payer: Self-pay | Admitting: *Deleted

## 2023-06-27 NOTE — Telephone Encounter (Signed)
Pharmacy called regarding not receiving Rx as stated on After Visit Summary (AVS).  RNCM gave verbal as written to Hollie Beach D.

## 2023-08-10 ENCOUNTER — Ambulatory Visit: Payer: PPO | Admitting: Neurology

## 2023-09-20 ENCOUNTER — Ambulatory Visit: Payer: PPO | Admitting: Neurology

## 2023-09-20 ENCOUNTER — Encounter: Payer: Self-pay | Admitting: Neurology

## 2023-09-20 VITALS — BP 140/78 | HR 80 | Ht 63.5 in | Wt 118.0 lb

## 2023-09-20 DIAGNOSIS — R270 Ataxia, unspecified: Secondary | ICD-10-CM

## 2023-09-20 DIAGNOSIS — R410 Disorientation, unspecified: Secondary | ICD-10-CM

## 2023-09-20 DIAGNOSIS — R413 Other amnesia: Secondary | ICD-10-CM

## 2023-09-20 DIAGNOSIS — R4189 Other symptoms and signs involving cognitive functions and awareness: Secondary | ICD-10-CM | POA: Diagnosis not present

## 2023-09-20 DIAGNOSIS — R7989 Other specified abnormal findings of blood chemistry: Secondary | ICD-10-CM

## 2023-09-20 DIAGNOSIS — G8929 Other chronic pain: Secondary | ICD-10-CM

## 2023-09-20 DIAGNOSIS — R29818 Other symptoms and signs involving the nervous system: Secondary | ICD-10-CM

## 2023-09-20 DIAGNOSIS — E538 Deficiency of other specified B group vitamins: Secondary | ICD-10-CM

## 2023-09-20 DIAGNOSIS — E519 Thiamine deficiency, unspecified: Secondary | ICD-10-CM

## 2023-09-20 DIAGNOSIS — R251 Tremor, unspecified: Secondary | ICD-10-CM

## 2023-09-20 DIAGNOSIS — R2689 Other abnormalities of gait and mobility: Secondary | ICD-10-CM | POA: Diagnosis not present

## 2023-09-20 DIAGNOSIS — R4701 Aphasia: Secondary | ICD-10-CM

## 2023-09-20 DIAGNOSIS — S060X9A Concussion with loss of consciousness of unspecified duration, initial encounter: Secondary | ICD-10-CM

## 2023-09-20 DIAGNOSIS — R269 Unspecified abnormalities of gait and mobility: Secondary | ICD-10-CM

## 2023-09-20 DIAGNOSIS — W19XXXA Unspecified fall, initial encounter: Secondary | ICD-10-CM

## 2023-09-20 DIAGNOSIS — M542 Cervicalgia: Secondary | ICD-10-CM

## 2023-09-20 DIAGNOSIS — M4802 Spinal stenosis, cervical region: Secondary | ICD-10-CM

## 2023-09-20 DIAGNOSIS — S0990XA Unspecified injury of head, initial encounter: Secondary | ICD-10-CM

## 2023-09-20 DIAGNOSIS — M6281 Muscle weakness (generalized): Secondary | ICD-10-CM

## 2023-09-20 NOTE — Patient Instructions (Addendum)
MRi brain and MRI cervical spine Blood work  Formal neurocognitive testing [place] Check markers for alzheimers disease and if positive CT scan brain to see if amyloid in the brain PT - [place] PT for imbalance  Memory Compensation Strategies  Use "WARM" strategy.  W= write it down  A= associate it  R= repeat it  M= make a mental note  2.   You can keep a Glass blower/designer.  Use a 3-ring notebook with sections for the following: calendar, important names and phone numbers,  medications, doctors' names/phone numbers, lists/reminders, and a section to journal what you did  each day.   3.    Use a calendar to write appointments down.  4.    Write yourself a schedule for the day.  This can be placed on the calendar or in a separate section of the Memory Notebook.  Keeping a  regular schedule can help memory.  5.    Use medication organizer with sections for each day or morning/evening pills.  You may need help loading it  6.    Keep a basket, or pegboard by the door.  Place items that you need to take out with you in the basket or on the pegboard.  You may also want to  include a message board for reminders.  7.    Use sticky notes.  Place sticky notes with reminders in a place where the task is performed.  For example: " turn off the  stove" placed by the stove, "lock the door" placed on the door at eye level, " take your medications" on  the bathroom mirror or by the place where you normally take your medications.  8.    Use alarms/timers.  Use while cooking to remind yourself to check on food or as a reminder to take your medicine, or as a  reminder to make a call, or as a reminder to perform another task, etc.

## 2023-09-20 NOTE — Progress Notes (Addendum)
[facility]    Provider:  Dr Lucia Gaskins Requesting Provider: Glendora Score, MD Primary Care Provider:  Julianne Handler, NP   CC:  Memory loss, imbalance  HPI:  [name] is a 67 y.o. female here as requested by Glendora Score, MD for post-concussive symptoms.  has CLOSTRIDIUM DIFFICILE COLITIS; Hyperlipidemia; Anxiety state; ANXIETY DEPRESSION; TOBACCO ABUSE; Migraine; Hereditary and idiopathic peripheral neuropathy; ENDOTHELIAL CORNEAL DYSTROPHY; PERIPHERAL VASCULAR DISEASE; DYSPLASTIC NEVUS, BACK; Asthma; GERD; Diverticulosis of colon; IRRITABLE BOWEL SYNDROME; BREAST MASS; SEBACEOUS CYST; DEGENERATIVE DISC DISEASE, LUMBAR SPINE; BACK PAIN, LUMBAR; Backache; WEIGHT LOSS; OTHER DYSPHAGIA; ABDOMINAL PAIN -GENERALIZED; GASTROINTESTINAL XRAY, ABNORMAL; CONTUSION, HIP, RIGHT; 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HCC); Essential hypertension; Chronic pain syndrome; Overweight (BMI 25.0-29.9); Coronary artery disease involving native coronary artery of native heart with unstable angina pectoris (HCC); Palpitations; Renal artery stenosis, non-flow-limiting (HCC); Hx of CABG; Anaphylactic reaction; COPD with chronic bronchitis (HCC); Sepsis (HCC); Diverticulitis; and H/O total shoulder replacement, left on their problem list.   I reviewed Dr. Lajoyce Corners notes from ED visit 06/23/2023: Patient with past medical history coronary artery disease status post MI, peripheral artery disease with claudication, COPD on 4 L home O2 presented to the emergency department after a fall with intermittent memory difficulties.  A week prior she had a fall and struck the back of her head on the ground, had confusion with repetitive questioning for approximately 3 hours after the event but patient reported to emergency room doctor that this had improved however she still did have repetitive questioning.  She reported some mild memory difficulties prior to the fall but worse afterwards.  CTs were ordered, patient increased  risk of bleeding due to blood thinners, she denied numbness tingling weakness or any other neurologic complaints.  On Eliquis and Plavix per ED notes when she was seen 06/23/2023.  Her neurologic exam was normal.  She did have some abnormalities on her blood work including slightly lowered sodium 132, potassium 2.8, chloride 90, elevated glucose 147, elevated creatinine 1.14 with a lower GFR also white blood cells elevated 15.2, hemoglobin and hematocrit decreased 10.8 and 35.5, platelets 421.  The emergency room repleted her potassium and magnesium, ordered CT head, maxillofacial and CT C-spine, unremarkable incidental right upper lobe consolidation stated may be consistent with pneumonia.  They suspected patient likely suffered a concussion.  They treated her leukocytosis with azithromycin.  An x-ray was also ordered of her chest, which showed no consolidation pneumothorax or effusion, no edema, normal cardio pericardial silhouette, calcified aorta, findings from prior coronary artery stents.  Patient and daughter report: She had a fall, she was "down", disoriented, didn;t know where she was, hit the back of her head, knew she was at home but had confusion, she doesn;t know why she fell, she fell getting out of bed, nobody saw her, no history of seizures. She couldn;t name "fan", she fell 330-4am and husband helped her at 6am they don;t sleep in the same room. Daughter states she has shaking on one or the other even before the fall, daughter provides most informaytion, daughter states it looks like parkinson's disease, squeezing on something helps, the tremor is at rest and when doing things. No fhx of tremors. Started having cognitive decline 5-7 years ago, daughter states forgetting meds especially after the fall, after the fall she was confused lasted a few weeks, Memory still not where it was prior to the falls but improved after head trauma. The memory loss may even go back to 2011 per patient. Daughter  noticed prior to the fall she didn;t know the day of the week, explaining things made her overwhelmed, word finding, daughter noticed it more after the heart attack she declined and after the concussion declined.  Last shot B12 3 weeks ago. Has headaches severe if she has an orgasm. Paternal Aunt and Uncle had early-onset alzheimers disease. Both parents unknown history and died young. No loss of smell/taste, no REM sleep disorder, does have imbalance and cannot close her eyes when standing she does not know where she is in space. Per daughter, She eats terribly, at the ED UA showed > 500 glucose. She has a history of migraines improved after MI and stenting per patient  Reviewed notes, labs and imaging from outside physicians, which showed:  06/2023:  CLINICAL DATA:  Trauma   EXAM: CT HEAD WITHOUT CONTRAST   CT MAXILLOFACIAL WITHOUT CONTRAST   CT CERVICAL SPINE WITHOUT CONTRAST   TECHNIQUE: Multidetector CT imaging of the head, cervical spine, and maxillofacial structures were performed using the standard protocol without intravenous contrast. Multiplanar CT image reconstructions of the cervical spine and maxillofacial structures were also generated.   RADIATION DOSE REDUCTION: This exam was performed according to the departmental dose-optimization program which includes automated exposure control, adjustment of the mA and/or kV according to patient size and/or use of iterative reconstruction technique.   COMPARISON:  None Available.   FINDINGS: CT HEAD FINDINGS   Brain: No evidence of acute infarction, hemorrhage, hydrocephalus, extra-axial collection or mass lesion/mass effect.   Vascular: No hyperdense vessel or unexpected calcification.   Skull: Normal. Negative for fracture or focal lesion.   Other: None.   CT MAXILLOFACIAL FINDINGS   Osseous: No fracture or mandibular dislocation. No destructive process.   Orbits: Bilateral lens replacement. Otherwise no evidence  of traumatic injury.   Sinuses: No middle ear or mastoid effusion. Complete opacification of the left maxillary sinus.   Soft tissues: Negative.   CT CERVICAL SPINE FINDINGS   Alignment: Grade 1 anterolisthesis of C3 on C4 and C4 on C5. Trace retrolisthesis of C5 on C6.   Skull base and vertebrae: No acute fracture. No primary bone lesion or focal pathologic process.   Soft tissues and spinal canal: No prevertebral fluid or swelling. No visible canal hematoma.   Disc levels:  No evidence of high-grade spinal canal stenosis.   Upper chest: Clustered nodularity in the right upper lobe and bronchial wall thickening, favored to be infectious or inflammatory.   Other: None   IMPRESSION: 1. No acute intracranial abnormality. 2. No acute facial bone fracture. 3. No acute fracture or traumatic malalignment of the cervical spine. 4. Clustered nodularity in the right upper lobe and bronchial wall thickening, favored to be infectious or inflammatory.    Review of Systems: Patient complains of symptoms per HPI as well as the following symptoms tremor. Pertinent negatives and positives per HPI. All others negative.   Social History   Socioeconomic History   Marital status: Married    Spouse name: Not on file   Number of children: Not on file   Years of education: Not on file   Highest education level: Not on file  Occupational History   Not on file  Tobacco Use   Smoking status: Some Days    Current packs/day: 1.00    Types: Cigarettes   Smokeless tobacco: Never   Tobacco comments:    >5 cigarettes per day  Vaping Use   Vaping status: Never Used  Substance and Sexual Activity  Alcohol use: Yes    Alcohol/week: 0.0 standard drinks of alcohol    Comment: Occasional to Rare   Drug use: No   Sexual activity: Not on file  Other Topics Concern   Not on file  Social History Narrative   Not on file   Social Determinants of Health   Financial Resource Strain: Not on  file  Food Insecurity: No Food Insecurity (03/05/2021)   Received from [facility], [facility]   Hunger Vital Sign    Worried About Running Out of Food in the Last Year: Never true    Ran Out of Food in the Last Year: Never true  Transportation Needs: Not on file  Physical Activity: Not on file  Stress: Not on file  Social Connections: Unknown (03/01/2022)   Received from [facility], [facility]   Social Network    Social Network: Not on file  Intimate Partner Violence: Unknown (01/21/2022)   Received from [facility], [facility]   HITS    Physically Hurt: Not on file    Insult or Talk Down To: Not on file    Threaten Physical Harm: Not on file    Scream or Curse: Not on file    Family History  Problem Relation Age of Onset   Hypertension Mother    Cancer - Other Father        Oropharyngeal SCCa   CAD Brother        Deceased on MI age 58   Seizures Maternal Uncle    Alzheimer's disease Paternal Aunt    Alzheimer's disease Paternal Uncle     Past Medical History:  Diagnosis Date   Acute diverticulitis 03/30/2020   Acute ST elevation myocardial infarction (STEMI) involving other coronary artery of inferior wall (HCC) 09/23/2021   Acute ST elevation myocardial infarction (STEMI) of inferior wall (HCC) 09/22/2021   Asthma    Benign essential HTN    Claudication in peripheral vascular disease (HCC)    Coronary artery disease    Hyperlipidemia    IBS (irritable bowel syndrome)    Migraine headache     Patient Active Problem List   Diagnosis Date Noted   H/O total shoulder replacement, left 09/16/2021   COPD with chronic bronchitis (HCC) 03/30/2020   Sepsis (HCC) 03/30/2020   Diverticulitis 03/30/2020   Anaphylactic reaction 03/23/2020   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   Renal artery stenosis, non-flow-limiting (HCC) 06/14/2016   Coronary artery disease involving native coronary artery of native heart with unstable angina pectoris (HCC) 04/29/2016   Palpitations  04/22/2016   Overweight (BMI 25.0-29.9) 01/22/2015   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   Essential hypertension 01/19/2015   Chronic pain syndrome 01/19/2015   GASTROINTESTINAL XRAY, ABNORMAL 01/05/2010   GERD 12/25/2009   BREAST MASS 10/24/2009   ANXIETY DEPRESSION 09/17/2009   WEIGHT LOSS 09/17/2009   ABDOMINAL PAIN -GENERALIZED 09/03/2009   CLOSTRIDIUM DIFFICILE COLITIS 06/16/2009   BACK PAIN, LUMBAR 06/09/2009   Hereditary and idiopathic peripheral neuropathy 05/28/2009   CONTUSION, HIP, RIGHT 05/28/2009   SEBACEOUS CYST 02/13/2009   Backache 02/13/2009   DYSPLASTIC NEVUS, BACK 09/18/2008   Anxiety state 08/28/2008   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   DEGENERATIVE DISC DISEASE, 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   OTHER DYSPHAGIA 10/30/2007   IRRITABLE BOWEL SYNDROME 09/29/2007   TOBACCO ABUSE 07/17/2007   Migraine 07/17/2007   Hyperlipidemia 03/22/2007   PERIPHERAL VASCULAR DISEASE 03/22/2007   Asthma 03/22/2007   Diverticulosis of colon 03/22/2007    Past Surgical History:  Procedure Laterality Date   ABDOMINAL AORTOGRAM W/LOWER EXTREMITY N/A 01/28/2023   Procedure: ABDOMINAL AORTOGRAM W/LOWER EXTREMITY;  Surgeon: Yates Decamp, MD;  Location: [facility]VASIVE [facility];  Service: Cardiovascular;  Laterality: N/A;   CORONARY/GRAFT ACUTE MI REVASCULARIZATION N/A 09/22/2021   Procedure: Coronary/Graft Acute MI Revascularization;  Surgeon: Yates Decamp, MD;  Location: [facility];  Service: Cardiovascular;  Laterality: N/A;   LEFT HEART CATH AND CORONARY ANGIOGRAPHY N/A 09/22/2021   Procedure: LEFT HEART CATH AND CORONARY ANGIOGRAPHY;  Surgeon: Yates Decamp, MD;  Location: [facility];  Service: Cardiovascular;  Laterality: N/A;   PERIPHERAL VASCULAR ATHERECTOMY  01/28/2023   Procedure: PERIPHERAL VASCULAR ATHERECTOMY;  Surgeon: Yates Decamp, MD;  Location: [facility] INVASIVE CV LAB;  Service: Cardiovascular;;   PERIPHERAL VASCULAR BALLOON ANGIOPLASTY  01/28/2023   Procedure: PERIPHERAL  VASCULAR BALLOON ANGIOPLASTY;  Surgeon: Yates Decamp, MD;  Location: [facility];  Service: Cardiovascular;;   REVERSE SHOULDER ARTHROPLASTY Left 09/16/2021   Procedure: REVERSE SHOULDER ARTHROPLASTY;  Surgeon: Beverely Low, MD;  Location: [facility];  Service: Orthopedics;  Laterality: Left;    Current Outpatient Medications  Medication Sig Dispense Refill   albuterol (PROVENTIL HFA;VENTOLIN HFA) 108 (90 BASE) MCG/ACT inhaler Inhale 2 puffs into the lungs 3 (three) times daily as needed for wheezing or shortness of breath.     apixaban (ELIQUIS) 5 MG TABS tablet Take 1 tablet (5 mg total) by mouth 2 (two) times daily. 180 tablet 3   atorvastatin (LIPITOR) 80 MG tablet Take 80 mg by mouth daily.     Budeson-Glycopyrrol-Formoterol (BREZTRI AEROSPHERE) 160-9-4.8 MCG/ACT AERO Inhale 2 puffs into the lungs in the morning and at bedtime. 10.7 g 6   clopidogrel (PLAVIX) 75 MG tablet Take 1 tablet (75 mg total) by mouth daily. 90 tablet 3   cyanocobalamin (VITAMIN B12) 1000 MCG/ML injection Inject 1,000 mcg as directed every 30 (thirty) days.     dapagliflozin propanediol (FARXIGA) 10 MG TABS tablet Take 1 tablet (10 mg total) by mouth daily. 90 tablet 3   diltiazem (CARDIZEM CD) 120 MG 24 hr capsule Take 1 capsule (120 mg total) by mouth daily. 90 capsule 3   diphenoxylate-atropine (LOMOTIL) 2.5-0.025 MG tablet Take 1 tablet by mouth 2 (two) times daily as needed for diarrhea or loose stools.     EPINEPHrine 0.3 mg/0.3 mL IJ SOAJ injection Inject 0.3 mg into the muscle as needed for anaphylaxis.     ezetimibe (ZETIA) 10 MG tablet Take 1 tablet (10 mg total) by mouth daily. 90 tablet 3   ferrous sulfate 325 (65 FE) MG tablet Take 325 mg by mouth 3 (three) times a week.     fluticasone (FLONASE) 50 MCG/ACT nasal spray Place 1 spray into both nostrils daily.     furosemide (LASIX) 20 MG tablet Take 20 mg by mouth daily as needed for fluid or edema.     losartan (COZAAR) 50 MG tablet Take 1 tablet (50  mg total) by mouth every evening. 90 tablet 3   Magnesium 400 MG TABS Take 400 mg by mouth daily.     metoprolol tartrate (LOPRESSOR) 50 MG tablet Take 1 tablet (50 mg total) by mouth 2 (two) times daily. 180 tablet 3   nitroGLYCERIN (NITROSTAT) 0.4 MG SL tablet Place 0.4 mg under the tongue every 5 (five) minutes as needed for chest pain.     potassium chloride SA (KLOR-CON M) 20 MEQ tablet Take 20 mEq by mouth daily as needed (Takes with Lasix).  pregabalin (LYRICA) 50 MG capsule Take 50 mg by mouth 3 (three) times daily.     promethazine (PHENERGAN) 12.5 MG tablet Take 12.5 mg by mouth 3 (three) times daily as needed for nausea or vomiting.     promethazine (PHENERGAN) 25 MG suppository Place 25 mg rectally daily as needed for nausea.     Vitamin D, Ergocalciferol, (DRISDOL) 1.25 MG (50000 UNIT) CAPS capsule Take 50,000 Units by mouth once a week.     No current facility-administered medications for this visit.    Allergies as of 09/20/2023 - Review Complete 09/20/2023  Allergen Reaction Noted   Cefepime Anaphylaxis, Shortness Of Breath, Swelling, and Dermatitis 03/22/2020   Edoxaban Other (See Comments) 03/22/2020   Penicillins Anaphylaxis 04/02/2021   Barium iodid Rash 12/07/2013   Other  02/13/2014   Acetaminophen  10/04/2006   Albuterol Hypertension 09/13/2018   Bactrim [sulfamethoxazole-trimethoprim]  03/26/2009   Beta adrenergic blockers  02/21/2020   Prednisone  12/14/2007   Propofol  10/04/2006   Repatha [evolocumab] Other (See Comments) 03/24/2020    Vitals: BP (!) 140/78   Pulse 80   Ht 5' 3.5" (1.613 m)   Wt 118 lb (53.5 kg)   BMI 20.57 kg/m  Last Weight:  Wt Readings from Last 1 Encounters:  09/20/23 118 lb (53.5 kg)   Last Height:   Ht Readings from Last 1 Encounters:  09/20/23 5' 3.5" (1.613 m)    Physical exam: Exam: Gen: NAD, conversant, well nourised, thin, well groomed                     CV: RRR, no MRG. No Carotid Bruits. Distally warm,  nontender Eyes: Conjunctivae clear without exudates or hemorrhage  Neuro: Detailed Neurologic Exam  Speech:    Speech is normal; fluent and spontaneous with normal comprehension.  Cognition:     09/20/2023   10:18 AM  MMSE - Mini Mental State Exam  Orientation to time 4  Orientation to Place 5  Registration 3  Attention/ Calculation 5  Recall 3  Language- name 2 objects 2  Language- repeat 1  Language- follow 3 step command 3  Language- read & follow direction 1  Write a sentence 1  Copy design 0  Total score 28       09/21/2023   10:57 AM  Montreal Cognitive Assessment   Visuospatial/ Executive (0/5) 4  Naming (0/3) 3  Attention: Read list of digits (0/2) 2  Attention: Read list of letters (0/1) 1  Attention: Serial 7 subtraction starting at 100 (0/3) 3  Language: Repeat phrase (0/2) 2  Language : Fluency (0/1) 1  Abstraction (0/2) 1  Delayed Recall (0/5) 5  Orientation (0/6) 6  Total 28     Cranial Nerves:    The pupils are equal, round, and reactive to light. The fundi are normal and spontaneous venous pulsations are present. Visual fields are full to finger confrontation. Extraocular movements are intact. Trigeminal sensation is intact and the muscles of mastication are normal. The face is symmetric. The palate elevates in the midline. Hearing intact. Voice is normal. Shoulder shrug is normal. The tongue has normal motion without fasciculations.   Coordination:    Normal finger to nose and heel to shin. Normal rapid alternating movements.   Gait:    Heel-toe and tandem gait are abnormal, imbalance, almost falls.Uses a walker at home.  Not shuffling.  Motor Observation:    No asymmetry, no atrophy, and postural and action high freq  low amplitude tremor, none at rest Tone:    Normal muscle tone.    Posture:    Posture is normal. normal erect    Strength:    Strength is 4-4+/5 diffusely and symmetrically      Sensation: intact to LT     +Romberg Reflex Exam:  DTR's:    Absent AJs. Deep tendon reflexes in the upper and lower extremities are symmetrical bilaterally.   Toes:    The toes are downgoing bilaterally.   Clonus:    Clonus is absent.    Assessment/Plan:  Very complicated patient with memory loss which worsened after a fall and concussion here with daughter. has CLOSTRIDIUM DIFFICILE COLITIS; Hyperlipidemia; Anxiety state; ANXIETY DEPRESSION; TOBACCO ABUSE; Migraine; Hereditary and idiopathic peripheral neuropathy; ENDOTHELIAL CORNEAL DYSTROPHY; PERIPHERAL VASCULAR DISEASE; DYSPLASTIC NEVUS, BACK; Asthma; GERD; Diverticulosis of colon; IRRITABLE BOWEL SYNDROME; BREAST MASS; SEBACEOUS CYST; DEGENERATIVE DISC DISEASE, LUMBAR SPINE; BACK PAIN, LUMBAR; Backache; WEIGHT LOSS; OTHER DYSPHAGIA; ABDOMINAL PAIN -GENERALIZED; GASTROINTESTINAL XRAY, ABNORMAL; CONTUSION, HIP, RIGHT; 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HCC); Essential hypertension; Chronic pain syndrome; Overweight (BMI 25.0-29.9); Coronary artery disease involving native coronary artery of native heart with unstable angina pectoris (HCC); Palpitations; Renal artery stenosis, non-flow-limiting (HCC); Hx of CABG; Anaphylactic reaction; COPD with chronic bronchitis (HCC); Sepsis (HCC); Diverticulitis; and H/O total shoulder replacement, left on their problem list.  Tremor: Does not appear parkinsonian and no other signs of parkinsonism. Check labs 2. 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 Memory loss made worse after concussion.  MRi brain w/wo contrast Blood work including Amyloid and APoe4 testing serum Formal neurocognitive testing [place] refer to Dr. Roseanne Reno Check markers for alzheimers disease and if positive CT scan brain to see if amyloid in the brain PT - [place] PT for imbalance and neuropathy, falls, gait  abnormality  MMSE 28/30 MoCA 28/30  Orders Placed This Encounter  Procedures   MR BRAIN W WO CONTRAST   MR CERVICAL SPINE WO CONTRAST   B12 and Folate Panel   Methylmalonic acid, serum   Vitamin B1   TSH Rfx on Abnormal to Free T4   RPR   BUN   Creatinine, Serum   APOE Alzheimer's Risk   ATN PROFILE   Glucose, Random   Ambulatory referral to Neuropsychology   Ambulatory referral to Physical Therapy   No orders of the defined types were placed in this encounter.   Cc: Kommor, Wyn Forster, MD,  Julianne Handler, NP  Naomie Dean, MD  [facility] [address] [place], [place] [address]  Phone [phone] Fax [phone]

## 2023-09-21 LAB — TSH RFX ON ABNORMAL TO FREE T4: TSH: 1.34 u[IU]/mL (ref 0.450–4.500)

## 2023-09-22 ENCOUNTER — Telehealth: Payer: Self-pay | Admitting: Neurology

## 2023-09-22 NOTE — Telephone Encounter (Signed)
[facility] adv NPR sent to GI [phone]

## 2023-09-26 ENCOUNTER — Telehealth: Payer: Self-pay | Admitting: Anesthesiology

## 2023-09-26 NOTE — Telephone Encounter (Signed)
-----   Message from Anson Fret sent at 09/26/2023  1:46 PM EST ----- Call daughter and telll her as well thanks: Blood work looks ok so far. You don;t have alzheimer's protein in the blood which is great. We will see what the genetic testing shows and the formal neurocognitive testing and the MRIs. Thanks! Dr. Lucia Gaskins

## 2023-09-26 NOTE — Telephone Encounter (Signed)
Called pt's daughter Zara(ok per DPR). I relayed to Waynard Edwards the pt's daughter the message from Dr Lucia Gaskins regarding lab results and follow up testing that is pending. She was advised that Dr Lucia Gaskins had already sent the referral for the MRI's and they will be getting a call to get pt scheduled. Pt's daughter verbalized understanding. Pt's daughter had no additional questions at this time but was encouraged to call back if questions arise

## 2023-09-28 ENCOUNTER — Telehealth: Payer: Self-pay | Admitting: Neurology

## 2023-09-28 NOTE — Telephone Encounter (Signed)
 Neuropsych referral faxed to [facility] (fax# [phone], phone# [phone])

## 2023-09-28 NOTE — Telephone Encounter (Signed)
PT referral faxed to [facility] [place] (fax# [phone], phone# [phone])

## 2023-10-01 LAB — ATN PROFILE
A -- Beta-amyloid 42/40 Ratio: 0.132 (ref >0.102)
Beta-amyloid 40: 217.41 pg/mL
Beta-amyloid 42: 28.64 pg/mL
N -- NfL, Plasma: 4.25 pg/mL (ref 0.00–4.61)
T -- p-tau181: 1.63 pg/mL — ABNORMAL HIGH (ref 0.00–0.97)

## 2023-10-01 LAB — APOE ALZHEIMER'S RISK

## 2023-10-01 LAB — B12 AND FOLATE PANEL
Folate: 3.4 ng/mL (ref >3.0)
Vitamin B-12: 780 pg/mL (ref 232–1245)

## 2023-10-01 LAB — CREATININE, SERUM
Creatinine, Ser: 1.02 mg/dL — ABNORMAL HIGH (ref 0.57–1.00)
eGFR: 61 mL/min/{1.73_m2} (ref >59)

## 2023-10-01 LAB — VITAMIN B1: Thiamine: 88.6 nmol/L (ref 66.5–200.0)

## 2023-10-01 LAB — METHYLMALONIC ACID, SERUM: Methylmalonic Acid: 279 nmol/L (ref 0–378)

## 2023-10-01 LAB — RPR: RPR Ser Ql: NONREACTIVE

## 2023-10-01 LAB — BUN: BUN: 13 mg/dL (ref 8–27)

## 2023-10-01 LAB — GLUCOSE, RANDOM: Glucose: 109 mg/dL — ABNORMAL HIGH (ref 70–99)

## 2023-10-11 ENCOUNTER — Encounter (INDEPENDENT_AMBULATORY_CARE_PROVIDER_SITE_OTHER): Payer: Self-pay

## 2023-11-15 ENCOUNTER — Encounter: Payer: Self-pay | Admitting: *Deleted

## 2023-11-16 ENCOUNTER — Ambulatory Visit
Admission: RE | Admit: 2023-11-16 | Discharge: 2023-11-16 | Disposition: A | Discharge status: Home / Self Care | Payer: PPO | Source: Ambulatory Visit | Attending: Neurology | Admitting: Neurology

## 2023-11-16 ENCOUNTER — Ambulatory Visit
Admission: RE | Admit: 2023-11-16 | Discharge: 2023-11-16 | Disposition: A | Discharge status: Home / Self Care | Payer: PPO | Source: Ambulatory Visit | Attending: Neurology

## 2023-11-16 ENCOUNTER — Encounter: Payer: Self-pay | Admitting: Neurology

## 2023-11-16 DIAGNOSIS — R29818 Other symptoms and signs involving the nervous system: Secondary | ICD-10-CM

## 2023-11-16 DIAGNOSIS — R251 Tremor, unspecified: Secondary | ICD-10-CM

## 2023-11-16 DIAGNOSIS — G8929 Other chronic pain: Secondary | ICD-10-CM

## 2023-11-16 DIAGNOSIS — W19XXXA Unspecified fall, initial encounter: Secondary | ICD-10-CM

## 2023-11-16 DIAGNOSIS — M4802 Spinal stenosis, cervical region: Secondary | ICD-10-CM

## 2023-11-16 DIAGNOSIS — M6281 Muscle weakness (generalized): Secondary | ICD-10-CM

## 2023-11-16 DIAGNOSIS — R413 Other amnesia: Secondary | ICD-10-CM

## 2023-11-16 DIAGNOSIS — S0990XA Unspecified injury of head, initial encounter: Secondary | ICD-10-CM

## 2023-11-16 DIAGNOSIS — S060X9A Concussion with loss of consciousness of unspecified duration, initial encounter: Secondary | ICD-10-CM

## 2023-11-16 DIAGNOSIS — R270 Ataxia, unspecified: Secondary | ICD-10-CM | POA: Diagnosis not present

## 2023-11-16 DIAGNOSIS — R2689 Other abnormalities of gait and mobility: Secondary | ICD-10-CM

## 2023-11-16 DIAGNOSIS — R4189 Other symptoms and signs involving cognitive functions and awareness: Secondary | ICD-10-CM

## 2023-11-16 DIAGNOSIS — R410 Disorientation, unspecified: Secondary | ICD-10-CM

## 2023-11-16 DIAGNOSIS — R4701 Aphasia: Secondary | ICD-10-CM

## 2023-11-16 MED ORDER — GADOPICLENOL 0.5 MMOL/ML IV SOLN: 5.0000 mL | Freq: Once | INTRAVENOUS | Status: DC | PRN

## 2023-11-16 MED ORDER — GADOPICLENOL 0.5 MMOL/ML IV SOLN
5.0000 mL | Freq: Once | INTRAVENOUS | Status: AC | PRN
  Administered 2023-11-16: 5 mL via INTRAVENOUS

## 2023-11-20 ENCOUNTER — Encounter: Payer: Self-pay | Admitting: Neurology

## 2023-12-13 ENCOUNTER — Encounter: Payer: Self-pay | Admitting: Cardiology

## 2023-12-13 ENCOUNTER — Ambulatory Visit: Payer: PPO | Attending: Cardiology | Admitting: Cardiology

## 2023-12-13 ENCOUNTER — Other Ambulatory Visit: Payer: Self-pay

## 2023-12-13 VITALS — BP 116/52 | HR 64 | Resp 16 | Ht 63.0 in | Wt 128.0 lb

## 2023-12-13 DIAGNOSIS — I25118 Atherosclerotic heart disease of native coronary artery with other forms of angina pectoris: Secondary | ICD-10-CM | POA: Diagnosis not present

## 2023-12-13 DIAGNOSIS — E7800 Pure hypercholesterolemia, unspecified: Secondary | ICD-10-CM

## 2023-12-13 DIAGNOSIS — I480 Paroxysmal atrial fibrillation: Secondary | ICD-10-CM

## 2023-12-13 DIAGNOSIS — I739 Peripheral vascular disease, unspecified: Secondary | ICD-10-CM

## 2023-12-13 DIAGNOSIS — I10 Essential (primary) hypertension: Secondary | ICD-10-CM

## 2023-12-13 MED ORDER — EZETIMIBE 10 MG PO TABS: 10.0000 mg | ORAL_TABLET | Freq: Every day | ORAL | 3 refills | Status: AC

## 2023-12-13 NOTE — Progress Notes (Signed)
 Cardiology Office Note:  .   Date:  12/13/2023  ID:  [name], DOB 12/01/1955, MRN [id number] PCP: Julianne Handler, NP  [facility] Providers Cardiologist:  Yates Decamp, MD   History of Present Illness: .   [name] is a 68 y.o. 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   Discussed the use of AI scribe software for clinical note transcription with the patient, who gave verbal consent to proceed.  History of Present Illness   The patient, with a history of peripheral vascular disease and coronary artery disease, presents with worsening claudication in the right leg. She reports that the pain has increased since the angioplasty of the left leg, stating that 'making the left one better almost makes the right one worse.' The pain radiates into the hip.  She also reports intermittent chest discomfort, described as 'a little electrical, a little bit,' which she manages with baby aspirin as needed. She denies taking more than two baby aspirin at a time.  In addition, the patient reports a recent fall, which resulted in a concussion. She describes waking up disoriented and not knowing where she was. She also reports missing several doses of her medication around this time. She has since set reminders to take her medication. She also reports a wrist injury from the fall, but it is unclear if this was evaluated or treated.  The patient is on multiple medications, including  atorvastatin, ezetimibe, clopidogrel, and Eliquis. She reports compliance with her medication regimen.     Labs   Lab Results  Component Value Date   CHOL 192 11/19/2022   HDL 77 11/19/2022   LDLCALC 98 11/19/2022   LDLDIRECT 173.8 10/24/2009   TRIG 96 11/19/2022   CHOLHDL 6.0 09/24/2021   Lab Results  Component Value Date   NA 137 06/23/2023   K 2.8 (L) 06/23/2023   CO2 30 06/23/2023   GLUCOSE 109 (H) 09/20/2023   BUN 13 09/20/2023   CREATININE 1.02 (H) 09/20/2023   CALCIUM 8.3 (L) 06/23/2023   EGFR 61 09/20/2023   GFRNONAA 53 (L) 06/23/2023      Latest Ref Rng & Units 09/20/2023   11:32 AM 06/23/2023    3:37 PM 06/23/2023   12:38 PM  BMP  Glucose 70 - 99 mg/dL 409   811   BUN 8 - 27 mg/dL 13   12   Creatinine 9.14 - 1.00 mg/dL 7.82   9.56   Sodium 213 - 145 mmol/L  137  132   Potassium 3.5 - 5.1 mmol/L  2.8  2.8   Chloride 98 - 111 mmol/L   90   CO2 22 - 32 mmol/L   30   Calcium 8.9 - 10.3 mg/dL   8.3       Latest Ref Rng & Units 06/23/2023    3:37 PM 06/23/2023   12:38 PM 01/28/2023    6:39 AM  CBC  WBC 4.0 - 10.5 K/uL  15.2    Hemoglobin 12.0 - 15.0 g/dL 11.6  10.8  11.9   Hematocrit 36.0 - 46.0 % 34.0  35.5  35.0   Platelets 150 - 400 K/uL  421     Lab Results  Component Value Date   HGBA1C 6.7 (H) 09/17/2021    Lab Results  Component Value Date   TSH 1.340 09/20/2023    Review of Systems  Cardiovascular:  Positive for chest pain and claudication. Negative for dyspnea on exertion and leg swelling.   Physical Exam:   VS:  BP (!) 116/52 (BP Location: Left Arm, Patient Position: Sitting, Cuff Size: Normal)   Pulse 64   Resp 16   Ht 5\' 3"  (1.6 m)   Wt 128 lb (58.1 kg)   SpO2 94%   BMI 22.67 kg/m    Wt Readings from Last 3 Encounters:  12/13/23 128 lb (58.1 kg)  09/20/23 118 lb (53.5 kg)  06/23/23 128 lb (58.1 kg)     Physical Exam Neck:     Vascular: No carotid bruit or JVD.  Cardiovascular:     Rate and Rhythm: Normal rate and regular rhythm.      Pulses:          Dorsalis pedis pulses are 0 on the right side and 1+ on the left side.       Posterior tibial pulses are 0 on the right side.     Heart sounds: Normal heart sounds. No murmur heard.    No gallop.  Pulmonary:     Effort: Pulmonary effort is normal.     Breath sounds: Normal breath sounds.  Abdominal:     General: Bowel sounds are normal.     Palpations: Abdomen is soft.  Musculoskeletal:     Right lower leg: No edema.     Left lower leg: No edema.    Studies Reviewed: Marland Kitchen    CABG 04/29/2016: LIMA to LAD, SVG to RCA, SVG to OM1, SVG to OM2 for left main 90% stenosis 50% stenosis in circumflex and 60% stenosis in OM1, 50% stenosis in proximal RCA.   Left Heart Catheterization 09/23/21:  RCA diffuse in the proximal and mid RCA with ulceration and scattered 80-99% stenosis.  Successful PCI to ulcerated high grade native RCA with implantation of 3.0x48 mm Synergy SD in the proximal to mid RCA. Timi 3 to 3 flow. LV: EF 35-40% with inferior akinesis. No MR.       ECHOCARDIOGRAM COMPLETE 11/23/2021 Left ventricle cavity is normal in size and wall thickness. Abnormal septal wall motion due to post-operative coronary artery bypass graft. Normal LV systolic function with EF 61%. Doppler evidence of grade I (impaired) diastolic dysfunction, normal LAP. No significant valvular abnormality. Normal right atrial pressure.  Compared to the study 09/23/2021, LVEF 45%, PASP 50 mm Hg, Mod-severe posteriorly directed MR not present.   Peripheral arteriogram and angioplasty 01/28/2023:  brisk flow and maintenance of three-vessel runoff.        ABI 05/05/2023: This exam reveals moderately decreased perfusion of the right lower extremity, noted at the post tibial artery level (ABI 0.77) with markedly abnormal monophasic waveform pattern at the ankle. This exam reveals mildly decreased perfusion of the left lower extremity, noted at the dorsalis pedis artery level (ABI 0.93) with  normal triphasic waveform at the ankle. Compared to 11/21/2022, right ABI no significant change, left ABI has improved from 0.60 and monophasic waveform pattern now triphasic. Patient has h/o left SFA atherectomy.  EKG:    EKG 05/12/2023: Normal sinus rhythm with rate of 71  bpm, left atrial enlargement, normal axis. Incomplete right bundle branch block. No evidence of ischemia.   Medications and allergies    Allergies  Allergen Reactions   Cefepime Anaphylaxis, Shortness Of Breath, Swelling and Dermatitis   Edoxaban Other (See Comments)    Causes hematomas   Penicillins Anaphylaxis   Barium Iodid Rash   Other     Preservatives  (C-Diff)    Acetaminophen     Will not take after Cyanide deaths, something changed when they mkt'd again.  Can take Childrens Liquid but sometimes upset stomach too   Albuterol Hypertension    ABLE TO TOLERATE IN INHALER FORM NOT NEBULIZER    Bactrim [Sulfamethoxazole-Trimethoprim]     REACTION: antibiotic induced colitis   Beta Adrenergic Blockers     Paradoxical reactions   Prednisone      ibs problems   Propofol     REACTION: FEVER   Repatha [Evolocumab] Other (See Comments)    Knocked her out for 10 days     Current Outpatient Medications:    albuterol (PROVENTIL HFA;VENTOLIN HFA) 108 (90 BASE) MCG/ACT inhaler, Inhale 2 puffs into the lungs 3 (three) times daily as needed for wheezing or shortness of breath., Disp: , Rfl:    apixaban (ELIQUIS) 5 MG TABS tablet, Take 1 tablet (5 mg total) by mouth 2 (two) times daily., Disp: 180 tablet, Rfl: 3   atorvastatin (LIPITOR) 80 MG tablet, Take 80 mg by mouth daily., Disp: , Rfl:    Budeson-Glycopyrrol-Formoterol (BREZTRI AEROSPHERE) 160-9-4.8 MCG/ACT AERO, Inhale 2 puffs into the lungs in the morning and at bedtime., Disp: 10.7 g, Rfl: 6   cyanocobalamin (VITAMIN B12) 1000 MCG/ML injection, Inject 1,000 mcg as directed every 30 (thirty) days., Disp: , Rfl:    dapagliflozin propanediol (FARXIGA) 10 MG TABS  tablet, Take 1 tablet (10 mg total) by mouth daily., Disp: 90 tablet, Rfl: 3   diltiazem (CARDIZEM CD) 120 MG 24 hr capsule, Take 1 capsule (120 mg total) by mouth daily., Disp: 90 capsule, Rfl: 3   diphenoxylate-atropine (LOMOTIL) 2.5-0.025 MG tablet, Take 1 tablet by mouth 2 (two) times daily as needed for diarrhea or loose stools., Disp: , Rfl:    EPINEPHrine 0.3 mg/0.3 mL IJ SOAJ injection, Inject 0.3 mg into the muscle as needed for anaphylaxis., Disp: , Rfl:    ferrous sulfate 325 (65 FE) MG tablet, Take 325 mg by mouth 3 (three) times a week., Disp: , Rfl:    fluticasone (FLONASE) 50 MCG/ACT nasal spray, Place 1 spray into both nostrils daily., Disp: , Rfl:    furosemide (LASIX) 20 MG tablet, Take 20 mg by mouth daily as needed for fluid or edema., Disp: , Rfl:    losartan (COZAAR) 50 MG tablet, Take 1 tablet (50 mg total) by mouth every evening., Disp: 90 tablet, Rfl: 3   Magnesium 400 MG TABS, Take 400 mg by mouth daily., Disp: , Rfl:    metoprolol tartrate (LOPRESSOR) 50 MG tablet, Take 1 tablet (50 mg total) by mouth 2 (two) times daily., Disp: 180 tablet, Rfl: 3   nitroGLYCERIN (NITROSTAT) 0.4 MG SL tablet, Place 0.4 mg under the tongue every 5 (five) minutes as needed for chest pain., Disp: , Rfl:    potassium chloride SA (KLOR-CON M) 20 MEQ tablet, Take 20 mEq by mouth daily as needed (Takes with Lasix)., Disp: , Rfl:    pregabalin (LYRICA) 75 MG capsule, Take 75 mg by mouth 3 (three) times daily., Disp: , Rfl:  promethazine (PHENERGAN) 12.5 MG tablet, Take 12.5 mg by mouth 3 (three) times daily as needed for nausea or vomiting., Disp: , Rfl:    promethazine (PHENERGAN) 25 MG suppository, Place 25 mg rectally daily as needed for nausea., Disp: , Rfl:    Vitamin D, Ergocalciferol, (DRISDOL) 1.25 MG (50000 UNIT) CAPS capsule, Take 50,000 Units by mouth once a week., Disp: , Rfl:    ezetimibe (ZETIA) 10 MG tablet, Take 1 tablet (10 mg total) by mouth daily., Disp: 90 tablet, Rfl: 3    ASSESSMENT AND PLAN: .      ICD-10-CM   1. Claudication in peripheral vascular disease (HCC)  I73.9 VAS [place] ABI WITH/WO TBI    CBC    Basic Metabolic Panel (BMET)    Lipid Profile    2. Coronary artery disease of native artery of native heart with stable angina pectoris (HCC)  I25.118 VAS [place] ABI WITH/WO TBI    CBC    Basic Metabolic Panel (BMET)    Lipid Profile    3. Essential hypertension  I10 VAS [place] ABI WITH/WO TBI    CBC    Basic Metabolic Panel (BMET)    4. Paroxysmal atrial fibrillation (HCC)  I48.0 VAS [place] ABI WITH/WO TBI    CBC    Basic Metabolic Panel (BMET)     Assessment and Plan    Peripheral Arterial Disease (PAD) Claudication in the right leg has worsened, significant improvement in left leg claudication with pain now extending to the hip.  Scheduled for peripheral arteriogram, preprocedural ABI and blood work.  Risks of bleeding and infection but benefits of reduced pain and improved circulation. Alternatives include conservative management with medications and lifestyle changes. Outcome statistics show a high success rate for symptom improvement post-procedure. Order an ABI and a PV angiogram following the ABI. Discontinue clopidogrel. Order routine labs including lipids, CBC, and BMP. Schedule blood work before the right leg procedure.  She is full code.  Chest Pain Intermittent chest pain is described as electrical sensations and palpitations.  Patient has chronic stable angina.  Unless her symptoms are worse, will continue present medical therapy.  She is on high intensity statin with atorvastatin 80 mg along with metoprolol to tartrate 50 mg twice daily and diltiazem CD1 20 mg daily.  Currently on Eliquis and clopidogrel, but clopidogrel is being discontinued to avoid bleeding complications long-term.  Suspect she will need Plavix to be reintroduced after peripheral arteriogram.  Risks of discontinuing clopidogrel include potential increased clotting, but benefits  include reduced bleeding risk during the procedure.  Hyperlipidemia Currently on atorvastatin 80 mg and ezetimibe. Cholesterol levels need checking as she has not been monitored recently. Order a lipid panel.  Paroxysmal atrial fibrillation Patient is presently maintaining sinus rhythm, presently on Eliquis 2.5 mg twice daily, has mild stage IIIa chronic kidney disease as well.  General Health Maintenance Routine health maintenance includes cholesterol monitoring and medication management. Order routine labs including lipids, CBC, and BMP. Schedule blood work before the right leg procedure and a PV angiogram after the ABI.           Signed,  Yates Decamp, MD, [facility] 12/13/2023, 7:25 PM [facility] [address] [place], [place] [address] Phone: [phone]. Fax:  [phone]

## 2023-12-13 NOTE — Patient Instructions (Signed)
 Medication Instructions:  Your physician has recommended you make the following change in your medication: Stop clopidogrel  *If you need a refill on your cardiac medications before your next appointment, please call your pharmacy*   Lab Work: Have lab work drawn today at [facility] on the first floor--Lipids, CBC, BMP If you have labs (blood work) drawn today and your tests are completely normal, you will receive your results only by: MyChart Message (if you have MyChart) OR A paper copy in the mail If you have any lab test that is abnormal or we need to change your treatment, we will call you to review the results.   Testing/Procedures: Your physician has requested that you have an ankle brachial index (ABI). During this test an ultrasound and blood pressure cuff are used to evaluate the arteries that supply the arms and legs with blood. Allow thirty minutes for this exam. There are no restrictions or special instructions.  Please note: We ask at that you not bring children with you during ultrasound (echo/ vascular) testing. Due to room size and safety concerns, children are not allowed in the ultrasound rooms during exams. Our front office staff cannot provide observation of children in our lobby area while testing is being conducted. An adult accompanying a patient to their appointment will only be allowed in the ultrasound room at the discretion of the ultrasound technician under special circumstances. We apologize for any inconvenience.   PV Angiogram to be arranged after ABI's are done   Follow-Up: At [facility], you and your health needs are our priority.  As part of our continuing mission to provide you with exceptional heart care, we have created designated Provider Care Teams.  These Care Teams include your primary Cardiologist (physician) and Advanced Practice Providers (APPs -  Physician Assistants and Nurse Practitioners) who all work together to provide you with the  care you need, when you need it.  We recommend signing up for the patient portal called "MyChart".  Sign up information is provided on this After Visit Summary.  MyChart is used to connect with patients for Virtual Visits (Telemedicine).  Patients are able to view lab/test results, encounter notes, upcoming appointments, etc.  Non-urgent messages can be sent to your provider as well.   To learn more about what you can do with MyChart, go to [email or web address].    Your next appointment:   To be determined after procedure scheduled  Provider:   Yates Decamp, MD     Other Instructions

## 2023-12-16 LAB — LIPID PANEL

## 2023-12-17 ENCOUNTER — Encounter: Payer: Self-pay | Admitting: Cardiology

## 2023-12-17 LAB — CBC
Hematocrit: 34.4 % (ref 34.0–46.6)
Hemoglobin: 11.2 g/dL (ref 11.1–15.9)
MCH: 26.7 pg (ref 26.6–33.0)
MCHC: 32.6 g/dL (ref 31.5–35.7)
MCV: 82 fL (ref 79–97)
Platelets: 375 10*3/uL (ref 150–450)
RBC: 4.19 x10E6/uL (ref 3.77–5.28)
RDW: 15 % (ref 11.7–15.4)
WBC: 12.2 10*3/uL — ABNORMAL HIGH (ref 3.4–10.8)

## 2023-12-17 LAB — BASIC METABOLIC PANEL
BUN/Creatinine Ratio: 10 — ABNORMAL LOW (ref 12–28)
BUN: 10 mg/dL (ref 8–27)
CO2: 21 mmol/L (ref 20–29)
Calcium: 8.9 mg/dL (ref 8.7–10.3)
Chloride: 103 mmol/L (ref 96–106)
Creatinine, Ser: 0.98 mg/dL (ref 0.57–1.00)
Glucose: 97 mg/dL (ref 70–99)
Potassium: 4.8 mmol/L (ref 3.5–5.2)
Sodium: 140 mmol/L (ref 134–144)
eGFR: 64 mL/min/{1.73_m2} (ref >59)

## 2023-12-17 LAB — LIPID PANEL
Chol/HDL Ratio: 2.5 ratio (ref 0.0–4.4)
Cholesterol, Total: 145 mg/dL (ref 100–199)
HDL: 58 mg/dL (ref >39)
LDL Chol Calc (NIH): 70 mg/dL (ref 0–99)
Triglycerides: 90 mg/dL (ref 0–149)
VLDL Cholesterol Cal: 17 mg/dL (ref 5–40)

## 2023-12-17 NOTE — Progress Notes (Signed)
 Normal blood count, normal kidney function, lipids under excellent control.  Overall labs are stable for proceeding with peripheral arteriogram.

## 2023-12-27 ENCOUNTER — Telehealth: Payer: Self-pay | Admitting: *Deleted

## 2023-12-27 DIAGNOSIS — Z01812 Encounter for preprocedural laboratory examination: Secondary | ICD-10-CM

## 2023-12-27 DIAGNOSIS — I739 Peripheral vascular disease, unspecified: Secondary | ICD-10-CM

## 2023-12-27 NOTE — Telephone Encounter (Signed)
 Reviewed with Dr Jacinto Halim and OK to stop Eliquis prior to procedure per protocol

## 2023-12-27 NOTE — Telephone Encounter (Signed)
 Peripheral arteriogram is to be done after ABI's completed.  ABI's are scheduled for 3/21.  Peripheral arteriogram can be done on 3/27.  I placed call to patient and left message to call office.  Procedure scheduled for 3/27.  Has to be at 7:30 with 5:30 arrival per cath lab.  Will need to confirm patient is available for procedure that day

## 2023-12-27 NOTE — Telephone Encounter (Signed)
-----   Message from Yates Decamp sent at 12/17/2023  6:07 PM EST ----- Normal blood count, normal kidney function, lipids under excellent control.  Overall labs are stable for proceeding with peripheral arteriogram.

## 2023-12-28 NOTE — Telephone Encounter (Signed)
 Patient was returning call. Please advise ?

## 2023-12-28 NOTE — Telephone Encounter (Signed)
 Spoke with pt regarding peripheral arteriogram. Pt stated that she will be out of town on 3/27, the day the procedure is scheduled. Pt was told this information would be forwarded to Dr. Verl Dicker nurse for rescheduling. Pt verbalized understanding. All questions, if any, were answered.

## 2023-12-29 ENCOUNTER — Other Ambulatory Visit: Payer: Self-pay | Admitting: Cardiology

## 2023-12-29 ENCOUNTER — Encounter: Payer: Self-pay | Admitting: *Deleted

## 2023-12-29 NOTE — Telephone Encounter (Signed)
 I spoke with patient and scheduled her for procedure on 4/17 at 7:30.  She will come to office for nurse room visit EKG and then have lab work done the same day on 4/11 at 11 AM I verbally went over instructions with patient and will mail copy to her

## 2023-12-29 NOTE — Telephone Encounter (Signed)
 I will take care of it closer to the procedure and it will be in my inbox. Thanks

## 2023-12-29 NOTE — Telephone Encounter (Signed)
 I spoke with cath lab and cancelled procedure.  Procedure can be done 4/17, 4/18 or 4/25.  Has to be first case.  Patient will need nurse room visit for EKG and will need updated lab work prior to procedure

## 2024-01-06 ENCOUNTER — Ambulatory Visit (HOSPITAL_COMMUNITY)
Admission: RE | Admit: 2024-01-06 | Discharge: 2024-01-06 | Disposition: A | Discharge status: Home / Self Care | Payer: PPO | Source: Ambulatory Visit | Attending: Cardiology | Admitting: Cardiology

## 2024-01-06 ENCOUNTER — Encounter: Payer: Self-pay | Admitting: Cardiology

## 2024-01-06 DIAGNOSIS — I739 Peripheral vascular disease, unspecified: Secondary | ICD-10-CM | POA: Diagnosis present

## 2024-01-06 DIAGNOSIS — I480 Paroxysmal atrial fibrillation: Secondary | ICD-10-CM | POA: Insufficient documentation

## 2024-01-06 DIAGNOSIS — I25118 Atherosclerotic heart disease of native coronary artery with other forms of angina pectoris: Secondary | ICD-10-CM | POA: Diagnosis present

## 2024-01-06 DIAGNOSIS — I10 Essential (primary) hypertension: Secondary | ICD-10-CM | POA: Insufficient documentation

## 2024-01-06 LAB — VAS US ABI WITH/WO TBI
Left ABI: 0.79
Right ABI: 0.44

## 2024-01-06 NOTE — Progress Notes (Signed)
+-------+-----------+-----------+------------+------------+   Patient already set up for PV angio  ABI 01/06/24  ABI/TBIToday's ABIToday's TBIPrevious ABIPrevious TBI +-------+-----------+-----------+------------+------------+ Right  .44        .28        .77                      +-------+-----------+-----------+------------+------------+ Left   .79        .49        .93                      +-------+-----------+-----------+------------+------------+  Bilateral ABIs appear decreased compared to prior study on 05/08/2023.

## 2024-01-24 LAB — CBC
Hematocrit: 34.4 % (ref 34.0–46.6)
Hemoglobin: 10.8 g/dL — ABNORMAL LOW (ref 11.1–15.9)
MCH: 26 pg — ABNORMAL LOW (ref 26.6–33.0)
MCHC: 31.4 g/dL — ABNORMAL LOW (ref 31.5–35.7)
MCV: 83 fL (ref 79–97)
Platelets: 379 10*3/uL (ref 150–450)
RBC: 4.15 x10E6/uL (ref 3.77–5.28)
RDW: 15.2 % (ref 11.7–15.4)
WBC: 11.9 10*3/uL — ABNORMAL HIGH (ref 3.4–10.8)

## 2024-01-24 LAB — BASIC METABOLIC PANEL WITH GFR
BUN/Creatinine Ratio: 18 (ref 12–28)
BUN: 20 mg/dL (ref 8–27)
CO2: 23 mmol/L (ref 20–29)
Calcium: 9.3 mg/dL (ref 8.7–10.3)
Chloride: 95 mmol/L — ABNORMAL LOW (ref 96–106)
Creatinine, Ser: 1.11 mg/dL — ABNORMAL HIGH (ref 0.57–1.00)
Glucose: 234 mg/dL — ABNORMAL HIGH (ref 70–99)
Potassium: 3.7 mmol/L (ref 3.5–5.2)
Sodium: 136 mmol/L (ref 134–144)
eGFR: 55 mL/min/{1.73_m2} — ABNORMAL LOW (ref >59)

## 2024-01-24 NOTE — Progress Notes (Signed)
 She does not need EKG. It is PV angiogram. Labs are good for angio

## 2024-01-26 ENCOUNTER — Telehealth: Payer: Self-pay

## 2024-01-26 NOTE — Telephone Encounter (Signed)
 Copied from CRM [id number]. Topic: Medical Record Request - Other >> Jan 26, 2024  2:16 PM Orinda Kenner C wrote: Reason for CRM: April from [employer] [phone] fax# [phone] is providing patient  with continual services for oxygen and they are looking for recent chart notes and prescription. Please call or fax back with the information.  Pt has not been seen in a year. Pt would need an appointment before a RX and LOV can be sent as insurance and DME will not accept anything outside of 30 days. Routing to front desk for scheduling.

## 2024-01-27 ENCOUNTER — Ambulatory Visit

## 2024-01-31 ENCOUNTER — Telehealth: Payer: Self-pay | Admitting: *Deleted

## 2024-01-31 NOTE — Telephone Encounter (Addendum)
 Abdominal Aortogram scheduled at [facility] for: Thursday February 02, 2024 7:30 AM Arrival time [facility] Main Entrance A at: 5:30 AM  Nothing to eat after midnight prior to procedure, clear liquids until 5 AM day of procedure.  Medication instructions: -Hold:  Eliquis-none 01/31/24 until post procedure-pt reports last dose AM 01/31/24-knows to hold until post procedure.  Losartan/Lasix/KCl-day before and day of procedure -per protocol GFR < 60 (55)  Farxiga-AM of procedure -Other usual morning medications can be taken with sips of water including aspirin 81 mg.  Plan to go home the same day, you will only stay overnight if medically necessary.  You must have responsible adult to drive you home.  Someone must be with you the first 24 hours after you arrive home.  Reviewed procedure instructions with patient.

## 2024-02-02 ENCOUNTER — Encounter (HOSPITAL_COMMUNITY)
Admission: RE | Disposition: A | Discharge status: Home / Self Care | Payer: Self-pay | Source: Home / Self Care | Attending: Cardiology

## 2024-02-02 ENCOUNTER — Ambulatory Visit (HOSPITAL_COMMUNITY)
Admission: RE | Admit: 2024-02-02 | Discharge: 2024-02-02 | Disposition: A | Discharge status: Home / Self Care | Attending: Cardiology | Admitting: Cardiology

## 2024-02-02 ENCOUNTER — Other Ambulatory Visit: Payer: Self-pay

## 2024-02-02 DIAGNOSIS — Z8249 Family history of ischemic heart disease and other diseases of the circulatory system: Secondary | ICD-10-CM | POA: Diagnosis not present

## 2024-02-02 DIAGNOSIS — I2510 Atherosclerotic heart disease of native coronary artery without angina pectoris: Secondary | ICD-10-CM | POA: Diagnosis not present

## 2024-02-02 DIAGNOSIS — Z951 Presence of aortocoronary bypass graft: Secondary | ICD-10-CM | POA: Diagnosis not present

## 2024-02-02 DIAGNOSIS — Z7901 Long term (current) use of anticoagulants: Secondary | ICD-10-CM | POA: Insufficient documentation

## 2024-02-02 DIAGNOSIS — F17210 Nicotine dependence, cigarettes, uncomplicated: Secondary | ICD-10-CM | POA: Diagnosis not present

## 2024-02-02 DIAGNOSIS — I10 Essential (primary) hypertension: Secondary | ICD-10-CM | POA: Insufficient documentation

## 2024-02-02 DIAGNOSIS — E119 Type 2 diabetes mellitus without complications: Secondary | ICD-10-CM | POA: Diagnosis not present

## 2024-02-02 DIAGNOSIS — E785 Hyperlipidemia, unspecified: Secondary | ICD-10-CM | POA: Diagnosis not present

## 2024-02-02 DIAGNOSIS — I252 Old myocardial infarction: Secondary | ICD-10-CM | POA: Insufficient documentation

## 2024-02-02 DIAGNOSIS — I70211 Atherosclerosis of native arteries of extremities with intermittent claudication, right leg: Secondary | ICD-10-CM | POA: Diagnosis present

## 2024-02-02 DIAGNOSIS — Z7984 Long term (current) use of oral hypoglycemic drugs: Secondary | ICD-10-CM | POA: Insufficient documentation

## 2024-02-02 DIAGNOSIS — J449 Chronic obstructive pulmonary disease, unspecified: Secondary | ICD-10-CM | POA: Diagnosis not present

## 2024-02-02 DIAGNOSIS — Z9981 Dependence on supplemental oxygen: Secondary | ICD-10-CM | POA: Diagnosis not present

## 2024-02-02 HISTORY — PX: LOWER EXTREMITY ANGIOGRAPHY: CATH118251

## 2024-02-02 HISTORY — PX: LOWER EXTREMITY INTERVENTION: CATH118252

## 2024-02-02 LAB — GLUCOSE, CAPILLARY
Glucose-Capillary: 127 mg/dL — ABNORMAL HIGH (ref 70–99)
Glucose-Capillary: 121 mg/dL — ABNORMAL HIGH (ref 70–99)

## 2024-02-02 LAB — POCT ACTIVATED CLOTTING TIME
Activated Clotting Time: 302 s
Activated Clotting Time: 268 s

## 2024-02-02 SURGERY — LOWER EXTREMITY ANGIOGRAPHY
Anesthesia: LOCAL

## 2024-02-02 MED ORDER — ASPIRIN 81 MG PO CHEW: 81.0000 mg | CHEWABLE_TABLET | ORAL | Status: AC

## 2024-02-02 MED ORDER — FENTANYL CITRATE (PF) 100 MCG/2ML IJ SOLN
INTRAMUSCULAR | Status: DC | PRN
  Administered 2024-02-02: 25 ug via INTRAVENOUS
  Administered 2024-02-02: 50 ug via INTRAVENOUS
  Administered 2024-02-02 (×3): 25 ug via INTRAVENOUS

## 2024-02-02 MED ORDER — SODIUM CHLORIDE 0.9 % WEIGHT BASED INFUSION
3.0000 mL/kg/h | INTRAVENOUS | Status: AC
Start: 2024-02-02 — End: 2024-02-02

## 2024-02-02 MED ORDER — SODIUM CHLORIDE 0.9 % WEIGHT BASED INFUSION
1.0000 mL/kg/h | INTRAVENOUS | Status: DC
  Administered 2024-02-02: 1 mL/kg/h via INTRAVENOUS

## 2024-02-02 MED ORDER — HEPARIN SODIUM (PORCINE) 1000 UNIT/ML IJ SOLN
INTRAMUSCULAR | Status: AC
  Filled 2024-02-02: qty 10

## 2024-02-02 MED ORDER — NITROGLYCERIN IN D5W 200-5 MCG/ML-% IV SOLN
INTRAVENOUS | Status: AC
  Filled 2024-02-02: qty 250

## 2024-02-02 MED ORDER — HEPARIN SODIUM (PORCINE) 1000 UNIT/ML IJ SOLN
INTRAMUSCULAR | Status: DC | PRN
  Administered 2024-02-02: 8000 [IU] via INTRAVENOUS
  Administered 2024-02-02: 2000 [IU] via INTRAVENOUS

## 2024-02-02 MED ORDER — SODIUM CHLORIDE 0.9% FLUSH: 3.0000 mL | INTRAVENOUS | Status: DC | PRN

## 2024-02-02 MED ORDER — MIDAZOLAM HCL 2 MG/2ML IJ SOLN
INTRAMUSCULAR | Status: DC | PRN
  Administered 2024-02-02: 2 mg via INTRAVENOUS
  Administered 2024-02-02: 1 mg via INTRAVENOUS

## 2024-02-02 MED ORDER — VIPERSLIDE LUBRICANT OPTIME: TOPICAL | Status: DC | PRN

## 2024-02-02 MED ORDER — ONDANSETRON HCL 4 MG/2ML IJ SOLN
4.0000 mg | Freq: Four times a day (QID) | INTRAMUSCULAR | Status: DC | PRN
Start: 2024-02-02 — End: 2024-02-02

## 2024-02-02 MED ORDER — MIDAZOLAM HCL 2 MG/2ML IJ SOLN
INTRAMUSCULAR | Status: AC
  Filled 2024-02-02: qty 2

## 2024-02-02 MED ORDER — NITROGLYCERIN 1 MG/10 ML FOR IR/CATH LAB
INTRA_ARTERIAL | Status: AC
  Filled 2024-02-02: qty 10

## 2024-02-02 MED ORDER — HEPARIN (PORCINE) IN NACL 1000-0.9 UT/500ML-% IV SOLN
INTRAVENOUS | Status: DC | PRN
  Administered 2024-02-02 (×2): 500 mL

## 2024-02-02 MED ORDER — ACETAMINOPHEN 325 MG PO TABS: 650.0000 mg | ORAL_TABLET | ORAL | Status: DC | PRN

## 2024-02-02 MED ORDER — SODIUM CHLORIDE 0.9 % IV SOLN: 250.0000 mL | INTRAVENOUS | Status: DC | PRN

## 2024-02-02 MED ORDER — LIDOCAINE HCL (PF) 1 % IJ SOLN
INTRAMUSCULAR | Status: DC | PRN
  Administered 2024-02-02: 15 mL via INTRADERMAL

## 2024-02-02 MED ORDER — IODIXANOL 320 MG/ML IV SOLN
INTRAVENOUS | Status: DC | PRN
  Administered 2024-02-02: 70 mL

## 2024-02-02 MED ORDER — VERAPAMIL HCL 2.5 MG/ML IV SOLN
INTRAVENOUS | Status: AC
  Filled 2024-02-02: qty 4

## 2024-02-02 MED ORDER — FENTANYL CITRATE (PF) 100 MCG/2ML IJ SOLN
INTRAMUSCULAR | Status: AC
  Filled 2024-02-02: qty 2

## 2024-02-02 MED ORDER — LIDOCAINE HCL (PF) 1 % IJ SOLN
INTRAMUSCULAR | Status: AC
  Filled 2024-02-02: qty 30

## 2024-02-02 MED ORDER — CLOPIDOGREL BISULFATE 75 MG PO TABS
75.0000 mg | ORAL_TABLET | Freq: Once | ORAL | Status: AC
  Administered 2024-02-02: 75 mg via ORAL
  Filled 2024-02-02: qty 1

## 2024-02-02 SURGICAL SUPPLY — 25 items
BAG SNAP BAND KOVER 36X36 (MISCELLANEOUS) ×1 IMPLANT
BALLOON COYOTE OTW 4X220X150 (BALLOONS) ×1 IMPLANT
CATH ANGIO 5F PIGTAIL 65CM (CATHETERS) ×1 IMPLANT
CATH CXI 2.3F 150 ANG (CATHETERS) ×1 IMPLANT
CLOSURE PERCLOSE PROSTYLE (VASCULAR PRODUCTS) ×1 IMPLANT
COVER DOME SNAP 22 D (MISCELLANEOUS) ×1 IMPLANT
DCB RANGER 5.0X100 135 (BALLOONS) ×1 IMPLANT
DCB RANGER 5.0X200 150 (BALLOONS) ×1 IMPLANT
GUIDEWIRE NITREX 0.018X80X5 (WIRE) ×1 IMPLANT
KIT ENCORE 26 ADVANTAGE (KITS) ×1 IMPLANT
KIT MICROPUNCTURE NIT STIFF (SHEATH) ×1 IMPLANT
KIT SINGLE USE MANIFOLD (KITS) ×1 IMPLANT
KIT SYRINGE INJ CVI SPIKEX1 (MISCELLANEOUS) ×1 IMPLANT
LUBRICANT VIPERSLIDE CORONARY (MISCELLANEOUS) ×1 IMPLANT
SET ATX-X65L (MISCELLANEOUS) ×1 IMPLANT
SHEATH CATAPULT 5F 45 MP (SHEATH) ×1 IMPLANT
SHEATH CATAPULT 7FR 45 (SHEATH) ×1 IMPLANT
SHEATH PINNACLE 5F 10CM (SHEATH) ×1 IMPLANT
SHEATH PROBE COVER 6X72 (BAG) ×1 IMPLANT
SYSTEM DIMNDBCK SLD OAS 1.5MM (CATHETERS) ×1 IMPLANT
TAPE SHOOT N SEE (TAPE) ×1 IMPLANT
TRAY PV CATH (CUSTOM PROCEDURE TRAY) ×3 IMPLANT
WIRE HI TORQ COMMND ES.014X300 (WIRE) ×1 IMPLANT
WIRE HITORQ VERSACORE ST 145CM (WIRE) ×1 IMPLANT
WIRE VIPER WIRECTO 0.014 (WIRE) ×1 IMPLANT

## 2024-02-02 NOTE — Discharge Instructions (Signed)
 Femoral Site Care This sheet gives you information about how to care for yourself after your procedure. Your health care provider may also give you more specific instructions. If you have problems or questions, contact your health care provider. What can I expect after the procedure?  After the procedure, it is common to have: Bruising that usually fades within 1-2 weeks. Tenderness at the site. Follow these instructions at home: Wound care Follow instructions from your health care provider about how to take care of your insertion site. Make sure you: Wash your hands with soap and water before you change your bandage (dressing). If soap and water are not available, use hand sanitizer. Remove your dressing as told by your health care provider. In 24 hours Do not take baths, swim, or use a hot tub until your health care provider approves. You may shower 24-48 hours after the procedure or as told by your health care provider. Gently wash the site with plain soap and water. Pat the area dry with a clean towel. Do not rub the site. This may cause bleeding. Do not apply powder or lotion to the site. Keep the site clean and dry. Check your femoral site every day for signs of infection. Check for: Redness, swelling, or pain. Fluid or blood. Warmth. Pus or a bad smell. Activity For the first 2-3 days after your procedure, or as long as directed: Avoid climbing stairs as much as possible. Do not squat. Do not lift anything that is heavier than 10 lb (4.5 kg), or the limit that you are told, until your health care provider says that it is safe. For 5 days Rest as directed. Avoid sitting for a long time without moving. Get up to take short walks every 1-2 hours. Do not drive for 24 hours if you were given a medicine to help you relax (sedative). General instructions Take over-the-counter and prescription medicines only as told by your health care provider. Keep all follow-up visits as told by  your health care provider. This is important. Contact a health care provider if you have: A fever or chills. You have redness, swelling, or pain around your insertion site. Get help right away if: The catheter insertion area swells very fast. You pass out. You suddenly start to sweat or your skin gets clammy. The catheter insertion area is bleeding, and the bleeding does not stop when you hold steady pressure on the area. The area near or just beyond the catheter insertion site becomes pale, cool, tingly, or numb. These symptoms may represent a serious problem that is an emergency. Do not wait to see if the symptoms will go away. Get medical help right away. Call your local emergency services (911 in the [place] Do not drive yourself to the hospital. Summary After the procedure, it is common to have bruising that usually fades within 1-2 weeks. Check your femoral site every day for signs of infection. Do not lift anything that is heavier than 10 lb (4.5 kg), or the limit that you are told, until your health care provider says that it is safe. This information is not intended to replace advice given to you by your health care provider. Make sure you discuss any questions you have with your health care provider. Document Revised: 10/17/2017 Document Reviewed: 10/17/2017 Elsevier Patient Education  2020 [employer].

## 2024-02-02 NOTE — H&P (Signed)
 CARDIOLOGY ADMIT NOTE     Patient ID: [name] MRN: [id number] DOB/AGE: 02-01-1956 68 y.o.  Admit date: 02/02/2024.   Primary Physician:  Julianne Handler, NP  Patient ID: [name], female    DOB: 11-15-1955, 68 y.o.   MRN: [id number]  No chief complaint on file.  HPI:    [name]  is a 68 y.o. 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   Past Medical History:  Diagnosis Date   Acute diverticulitis 03/30/2020   Acute ST elevation myocardial infarction (STEMI) involving other coronary artery of inferior wall (HCC) 09/23/2021   Acute ST elevation myocardial infarction (STEMI) of inferior wall (HCC) 09/22/2021   Asthma    Benign essential HTN    Claudication in peripheral vascular disease (HCC)    Coronary artery disease    Hyperlipidemia    IBS (irritable bowel syndrome)    Migraine headache    Past Surgical History:  Procedure Laterality Date   ABDOMINAL AORTOGRAM W/LOWER EXTREMITY N/A 01/28/2023   Procedure: ABDOMINAL AORTOGRAM W/LOWER EXTREMITY;  Surgeon: Yates Decamp, MD;  Location: [facility];  Service: Cardiovascular;  Laterality: N/A;   CORONARY/GRAFT ACUTE MI REVASCULARIZATION N/A 09/22/2021   Procedure: Coronary/Graft Acute MI Revascularization;  Surgeon: Yates Decamp, MD;  Location: [facility];  Service: Cardiovascular;  Laterality: N/A;   LEFT HEART CATH AND CORONARY ANGIOGRAPHY N/A 09/22/2021   Procedure: LEFT HEART CATH AND CORONARY ANGIOGRAPHY;  Surgeon: Yates Decamp, MD;  Location: [facility];  Service: Cardiovascular;  Laterality: N/A;   PERIPHERAL VASCULAR ATHERECTOMY  01/28/2023   Procedure: PERIPHERAL VASCULAR ATHERECTOMY;  Surgeon: Yates Decamp, MD;  Location: [facility];  Service: Cardiovascular;;   PERIPHERAL VASCULAR BALLOON ANGIOPLASTY  01/28/2023   Procedure: PERIPHERAL VASCULAR BALLOON ANGIOPLASTY;  Surgeon: Yates Decamp, MD;  Location: [facility];  Service: Cardiovascular;;   REVERSE SHOULDER ARTHROPLASTY Left 09/16/2021   Procedure: REVERSE SHOULDER ARTHROPLASTY;  Surgeon: Beverely Low, MD;  Location: [facility];  Service: Orthopedics;  Laterality: Left;   Social History   Socioeconomic History   Marital status: Married    Spouse name: Not on file   Number of children: Not on file   Years of education: Not on file   Highest education level: Not on file  Occupational History   Not on file  Tobacco Use   Smoking status: Some Days    Current packs/day: 1.00    Types: Cigarettes   Smokeless tobacco: Never   Tobacco comments:    >5 cigarettes per day  Vaping Use   Vaping status: Never Used  Substance and Sexual Activity   Alcohol use: Yes    Alcohol/week: 0.0 standard drinks of alcohol    Comment: Occasional to Rare   Drug use: No   Sexual activity: Not on file  Other Topics Concern   Not on file  Social History Narrative   Not on file   Social Drivers of Health   Financial Resource Strain: Not on file  Food Insecurity: No Food Insecurity (  03/05/2021)   Received from [facility], [facility]   Hunger Vital Sign    Worried About Running Out of Food in the Last Year: Never true    Ran Out of Food in the Last Year: Never true  Transportation Needs: Not on file  Physical Activity: Not on file  Stress: Not on file  Social Connections: Unknown (03/01/2022)   Received from [facility], [facility]   Social Network    Social Network: Not on file  Intimate Partner Violence: Unknown (01/21/2022)    Received from [facility], [facility]   HITS    Physically Hurt: Not on file    Insult or Talk Down To: Not on file    Threaten Physical Harm: Not on file    Scream or Curse: Not on file   Family History  Problem Relation Age of Onset   Hypertension Mother    Cancer - Other Father        Oropharyngeal SCCa   CAD Brother        Deceased on MI age 75   Seizures Maternal Uncle    Alzheimer's disease Paternal Aunt    Alzheimer's disease Paternal Uncle     ROS  Review of Systems  Cardiovascular:  Positive for claudication. Negative for chest pain, dyspnea on exertion and leg swelling.   Objective      02/02/2024    5:43 AM 12/13/2023    9:37 AM 09/20/2023   10:10 AM  Vitals with BMI  Height 5\' 3"  5\' 3"  5' 3.5"  Weight 123 lbs 128 lbs 118 lbs  BMI 21.79 22.68 20.57  Systolic 154 116 161  Diastolic 68 52 78  Pulse 67 64 80    Physical Exam Neck:     Vascular: No carotid bruit or JVD.  Cardiovascular:     Rate and Rhythm: Normal rate and regular rhythm.     Pulses:          Dorsalis pedis pulses are 0 on the right side and 1+ on the left side.       Posterior tibial pulses are 0 on the right side and 0 on the left side.     Heart sounds: Normal heart sounds. No murmur heard.    No gallop.  Pulmonary:     Effort: Pulmonary effort is normal.     Breath sounds: Normal breath sounds.  Abdominal:     General: Bowel sounds are normal.     Palpations: Abdomen is soft.  Musculoskeletal:     Right lower leg: No edema.     Left lower leg: No edema.     Recent Labs    06/23/23 1238 06/23/23 1537 09/20/23 1132 12/16/23 0936 01/23/24 0920  NA 132* 137  --  140 136  K 2.8* 2.8*  --  4.8 3.7  CL 90*  --   --  103 95*  CO2 30  --   --  21 23  GLUCOSE 147*  --  109* 97 234*  BUN 12  --  13 10 20   CREATININE 1.14*  --  1.02* 0.98 1.11*  CALCIUM 8.3*  --   --  8.9 9.3  GFRNONAA 53*  --   --   --   --    estimated creatinine clearance is 41.2 mL/min (A) (by C-G  formula based on SCr of 1.11 mg/dL (H)).     Latest Ref Rng & Units 01/23/2024    9:20 AM 12/16/2023  9:36 AM 09/20/2023   11:32 AM  CMP  Glucose 70 - 99 mg/dL 086  97  578   BUN 8 - 27 mg/dL 20  10  13    Creatinine 0.57 - 1.00 mg/dL 4.69  6.29  5.28   Sodium 134 - 144 mmol/L 136  140    Potassium 3.5 - 5.2 mmol/L 3.7  4.8    Chloride 96 - 106 mmol/L 95  103    CO2 20 - 29 mmol/L 23  21    Calcium 8.7 - 10.3 mg/dL 9.3  8.9        Latest Ref Rng & Units 01/23/2024    9:20 AM 12/16/2023    9:36 AM 06/23/2023    3:37 PM  CBC  WBC 3.4 - 10.8 x10E3/uL 11.9  12.2    Hemoglobin 11.1 - 15.9 g/dL 41.3  24.4  01.0   Hematocrit 34.0 - 46.6 % 34.4  34.4  34.0   Platelets 150 - 450 x10E3/uL 379  375     Lipid Panel     Component Value Date/Time   CHOL 145 12/16/2023 0936   TRIG 90 12/16/2023 0936   HDL 58 12/16/2023 0936   CHOLHDL 2.5 12/16/2023 0936   CHOLHDL 6.0 09/24/2021 0221   VLDL 24 09/24/2021 0221   LDLCALC 70 12/16/2023 0936   LDLDIRECT 173.8 10/24/2009 1152   HEMOGLOBIN A1C Lab Results  Component Value Date   HGBA1C 6.7 (H) 09/17/2021   MPG 146 09/17/2021   TSH Recent Labs    09/20/23 1144  TSH 1.340   BNP (last 3 results) No results for input(s): "BNP" in the last 8760 hours. Cardiac Panel (last 3 results) No results for input(s): "CKTOTAL", "CKMB", "TROPONINIHS", "RELINDX" in the last 72 hours.   Medications and allergies   Allergies  Allergen Reactions   Cefepime Anaphylaxis, Shortness Of Breath, Swelling and Dermatitis   Edoxaban Other (See Comments)    Causes hematomas   Penicillins Anaphylaxis   Barium Iodid Rash   Other     Preservatives  (C-Diff)    Acetaminophen     Will not take after Cyanide deaths, something changed when they mkt'd again.  Can take Childrens Liquid but sometimes upset stomach too   Albuterol Hypertension    ABLE TO TOLERATE IN INHALER FORM NOT NEBULIZER    Bactrim [Sulfamethoxazole-Trimethoprim]     REACTION: antibiotic  induced colitis   Beta Adrenergic Blockers     Paradoxical reactions   Prednisone      ibs problems   Propofol     REACTION: FEVER   Repatha [Evolocumab] Other (See Comments)    Knocked her out for 10 days    Current Outpatient Medications  Medication Instructions   AIRSUPRA 90-80 MCG/ACT AERO 2 puffs, Inhalation, Daily PRN   albuterol (PROVENTIL HFA;VENTOLIN HFA) 108 (90 BASE) MCG/ACT inhaler 2 puffs, 3 times daily PRN   apixaban (ELIQUIS) 5 mg, 2 times daily   atorvastatin (LIPITOR) 80 mg, Daily   Budeson-Glycopyrrol-Formoterol (BREZTRI AEROSPHERE) 160-9-4.8 MCG/ACT AERO 2 puffs, Inhalation, 2 times daily   cyanocobalamin (VITAMIN B12) 1,000 mcg, Every 30 days   dapagliflozin propanediol (FARXIGA) 10 mg, Oral, Daily   diltiazem (CARDIZEM CD) 120 mg, Oral, Daily   diphenoxylate-atropine (LOMOTIL) 2.5-0.025 MG tablet 1 tablet, 2 times daily PRN   EPINEPHrine (EPI-PEN) 0.3 mg, As needed   ezetimibe (ZETIA) 10 mg, Oral, Daily   ferrous sulfate 325 mg, 3 times weekly   fluticasone (FLONASE) 50 MCG/ACT nasal spray  1 spray, Daily   furosemide (LASIX) 20 mg, Daily PRN   losartan (COZAAR) 50 mg, Oral, Every evening   MAGNESIUM PO 500 mg, Daily   metoprolol tartrate (LOPRESSOR) 50 mg, Oral, 2 times daily   nitroGLYCERIN (NITROSTAT) 0.4 mg, Every 5 min PRN   potassium chloride SA (KLOR-CON M) 20 MEQ tablet 20 mEq, Daily PRN   pregabalin (LYRICA) 75 mg, 3 times daily   promethazine (PHENERGAN) 12.5 mg, 3 times daily PRN   Vitamin D (Ergocalciferol) (DRISDOL) 50,000 Units, Weekly    Cardiac Studies:   Peripheral arteriogram and angioplasty 01/28/2023:  brisk flow and maintenance of three-vessel runoff.        ABI 05/05/2023: This exam reveals moderately decreased perfusion of the right lower extremity, noted at the post tibial artery level (ABI 0.77) with markedly abnormal monophasic waveform pattern at the ankle. This exam reveals mildly decreased perfusion of the left lower  extremity, noted at the dorsalis pedis artery level (ABI 0.93) with normal triphasic waveform at the ankle. Compared to 11/21/2022, right ABI no significant change, left ABI has improved from 0.60 and monophasic waveform pattern now triphasic. Patient has h/o left SFA atherectomy.  Assessment   Peripheral Arterial Disease (PAD)   Recommendations:   Claudication in the right leg has worsened, significant improvement in left leg claudication with pain now extending to the hip.  Scheduled for peripheral arteriogram, preprocedural ABI and blood work.  Risks of bleeding and infection but benefits of reduced pain and improved circulation. Alternatives include conservative management with medications and lifestyle changes. Outcome statistics show a high success rate for symptom improvement post-procedure. Order an ABI and a PV angiogram following the ABI. Discontinue clopidogrel. Reviewed routine labs including lipids, CBC, and BMP.  She is full code      Knox Perl, MD, [facility] 02/02/2024, 7:54 AM [facility] [address] [place], [place] [address] Phone: [phone]. Fax:  [phone]

## 2024-02-02 NOTE — Interval H&P Note (Signed)
 History and Physical Interval Note:  02/02/2024 7:55 AM  [name]  has presented today for surgery, with the diagnosis of pvd w/ claudication.  The various methods of treatment have been discussed with the patient and family. After consideration of risks, benefits and other options for treatment, the patient has consented to  Procedure(s): ABDOMINAL AORTOGRAM W/LOWER EXTREMITY (N/A) and possible peripheral angioplasty as a surgical intervention.  The patient's history has been reviewed, patient examined, no change in status, stable for surgery.  I have reviewed the patient's chart and labs.  Questions were answered to the patient's satisfaction.     Knox Perl

## 2024-02-02 NOTE — Progress Notes (Signed)
 Patient walked to the bathroom without difficulties

## 2024-02-07 ENCOUNTER — Telehealth: Payer: Self-pay | Admitting: *Deleted

## 2024-02-07 DIAGNOSIS — I739 Peripheral vascular disease, unspecified: Secondary | ICD-10-CM

## 2024-02-07 NOTE — Telephone Encounter (Signed)
 From Dr Berry Bristol-  She will need ABI within 30 days of the angiogram, had successful angioplasty left SFA

## 2024-02-08 ENCOUNTER — Encounter (HOSPITAL_COMMUNITY): Payer: Self-pay | Admitting: Cardiology

## 2024-02-14 ENCOUNTER — Other Ambulatory Visit: Payer: Self-pay

## 2024-02-14 DIAGNOSIS — I739 Peripheral vascular disease, unspecified: Secondary | ICD-10-CM

## 2024-02-14 DIAGNOSIS — I10 Essential (primary) hypertension: Secondary | ICD-10-CM

## 2024-02-14 MED ORDER — LOSARTAN POTASSIUM 50 MG PO TABS: 50.0000 mg | ORAL_TABLET | Freq: Every evening | ORAL | 3 refills | Status: AC

## 2024-02-20 ENCOUNTER — Ambulatory Visit: Payer: PPO | Admitting: Neurology

## 2024-02-20 ENCOUNTER — Encounter: Payer: Self-pay | Admitting: Neurology

## 2024-02-20 VITALS — BP 168/80 | HR 81 | Ht 63.0 in | Wt 126.0 lb

## 2024-02-20 DIAGNOSIS — R4189 Other symptoms and signs involving cognitive functions and awareness: Secondary | ICD-10-CM

## 2024-02-20 DIAGNOSIS — R251 Tremor, unspecified: Secondary | ICD-10-CM

## 2024-02-20 DIAGNOSIS — R2689 Other abnormalities of gait and mobility: Secondary | ICD-10-CM | POA: Diagnosis not present

## 2024-02-20 NOTE — Progress Notes (Unsigned)
 [facility]    Provider:  Dr Tresia Fruit Requesting Provider: Trudi Fus, NP Primary Care Provider:  Trudi Fus, NP   CC:  Memory loss, imbalance  02/20/2024: She has an appointment with Dr. Annette Barters in June for formal memory testing. But so far all indications do not point to alzheimer's she has not gone to PT for balance. We discussed managing vascular risk factors, she has uncontrolled diabetes and doesn't eat well and also still smoking despite COPD and on Oxygen at night she follows with pulmonology. Felt better with increased O2 at night states she feels better on 4L of oxygen.  No other focal neurologic deficits, associated symptoms, inciting events or modifiable factors.Patient complains of symptoms per HPI as well as the following symptoms: none . Pertinent negatives and positives per HPI. All others negative  Patient complains of symptoms per HPI as well as the following symptoms: none . Pertinent negatives and positives per HPI. All others negative     HPI:  [name] is a 68 y.o. female here as requested by Trudi Fus, NP for post-concussive symptoms.  has CLOSTRIDIUM DIFFICILE COLITIS; Hyperlipidemia; Anxiety state; ANXIETY DEPRESSION; TOBACCO ABUSE; Migraine; Hereditary and idiopathic peripheral neuropathy; ENDOTHELIAL CORNEAL DYSTROPHY; PERIPHERAL VASCULAR DISEASE; DYSPLASTIC NEVUS, BACK; Asthma; GERD; Diverticulosis of colon; IRRITABLE BOWEL SYNDROME; BREAST MASS; SEBACEOUS CYST; DEGENERATIVE DISC DISEASE, LUMBAR SPINE; BACK PAIN, LUMBAR; Backache; WEIGHT LOSS; OTHER DYSPHAGIA; ABDOMINAL PAIN -GENERALIZED; GASTROINTESTINAL XRAY, ABNORMAL; CONTUSION, HIP, RIGHT; 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HCC); Essential hypertension; Chronic pain syndrome; Overweight (BMI 25.0-29.9); Coronary artery disease involving native coronary artery of native heart with unstable angina pectoris (HCC); Palpitations; Renal artery stenosis, non-flow-limiting (HCC); Hx of CABG;  Anaphylactic reaction; COPD with chronic bronchitis (HCC); Sepsis (HCC); Diverticulitis; and H/O total shoulder replacement, left on their problem list.   I reviewed Dr. Sterling Eisenmenger notes from ED visit 06/23/2023: Patient with past medical history coronary artery disease status post MI, peripheral artery disease with claudication, COPD on 4 L home O2 presented to the emergency department after a fall with intermittent memory difficulties.  A week prior she had a fall and struck the back of her head on the ground, had confusion with repetitive questioning for approximately 3 hours after the event but patient reported to emergency room doctor that this had improved however she still did have repetitive questioning.  She reported some mild memory difficulties prior to the fall but worse afterwards.  CTs were ordered, patient increased risk of bleeding due to blood thinners, she denied numbness tingling weakness or any other neurologic complaints.  On Eliquis  and Plavix  per ED notes when she was seen 06/23/2023.  Her neurologic exam was normal.  She did have some abnormalities on her blood work including slightly lowered sodium 132, potassium 2.8, chloride 90, elevated glucose 147, elevated creatinine 1.14 with a lower GFR also white blood cells elevated 15.2, hemoglobin and hematocrit decreased 10.8 and 35.5, platelets 421.  The emergency room repleted her potassium and magnesium , ordered CT head, maxillofacial and CT C-spine, unremarkable incidental right upper lobe consolidation stated may be consistent with pneumonia.  They suspected patient likely suffered a concussion.  They treated her leukocytosis with azithromycin .  An x-ray was also ordered of her chest, which showed no consolidation pneumothorax or effusion, no edema, normal cardio pericardial silhouette, calcified aorta, findings from prior coronary artery stents.  Patient and daughter report: She had a fall, she was "down", disoriented, didn;t know where she  was, hit the back of her head,  knew she was at home but had confusion, she doesn;t know why she fell, she fell getting out of bed, nobody saw her, no history of seizures. She couldn;t name "fan", she fell 330-4am and husband helped her at 6am they don;t sleep in the same room. Daughter states she has shaking on one or the other even before the fall, daughter provides most informaytion, daughter states it looks like parkinson's disease, squeezing on something helps, the tremor is at rest and when doing things. No fhx of tremors. Started having cognitive decline 5-7 years ago, daughter states forgetting meds especially after the fall, after the fall she was confused lasted a few weeks, Memory still not where it was prior to the falls but improved after head trauma. The memory loss may even go back to 2011 per patient. Daughter noticed prior to the fall she didn;t know the day of the week, explaining things made her overwhelmed, word finding, daughter noticed it more after the heart attack she declined and after the concussion declined.  Last shot B12 3 weeks ago. Has headaches severe if she has an orgasm. Paternal Aunt and Uncle had early-onset alzheimers disease. Both parents unknown history and died young. No loss of smell/taste, no REM sleep disorder, does have imbalance and cannot close her eyes when standing she does not know where she is in space. Per daughter, She eats terribly, at the ED UA showed > 500 glucose. She has a history of migraines improved after MI and stenting per patient  Reviewed notes, labs and imaging from outside physicians, which showed:  06/2023:  CLINICAL DATA:  Trauma   EXAM: CT HEAD WITHOUT CONTRAST   CT MAXILLOFACIAL WITHOUT CONTRAST   CT CERVICAL SPINE WITHOUT CONTRAST   TECHNIQUE: Multidetector CT imaging of the head, cervical spine, and maxillofacial structures were performed using the standard protocol without intravenous contrast. Multiplanar CT image  reconstructions of the cervical spine and maxillofacial structures were also generated.   RADIATION DOSE REDUCTION: This exam was performed according to the departmental dose-optimization program which includes automated exposure control, adjustment of the mA and/or kV according to patient size and/or use of iterative reconstruction technique.   COMPARISON:  None Available.   FINDINGS: CT HEAD FINDINGS   Brain: No evidence of acute infarction, hemorrhage, hydrocephalus, extra-axial collection or mass lesion/mass effect.   Vascular: No hyperdense vessel or unexpected calcification.   Skull: Normal. Negative for fracture or focal lesion.   Other: None.   CT MAXILLOFACIAL FINDINGS   Osseous: No fracture or mandibular dislocation. No destructive process.   Orbits: Bilateral lens replacement. Otherwise no evidence of traumatic injury.   Sinuses: No middle ear or mastoid effusion. Complete opacification of the left maxillary sinus.   Soft tissues: Negative.   CT CERVICAL SPINE FINDINGS   Alignment: Grade 1 anterolisthesis of C3 on C4 and C4 on C5. Trace retrolisthesis of C5 on C6.   Skull base and vertebrae: No acute fracture. No primary bone lesion or focal pathologic process.   Soft tissues and spinal canal: No prevertebral fluid or swelling. No visible canal hematoma.   Disc levels:  No evidence of high-grade spinal canal stenosis.   Upper chest: Clustered nodularity in the right upper lobe and bronchial wall thickening, favored to be infectious or inflammatory.   Other: None   IMPRESSION: 1. No acute intracranial abnormality. 2. No acute facial bone fracture. 3. No acute fracture or traumatic malalignment of the cervical spine. 4. Clustered nodularity in the right upper lobe  and bronchial wall thickening, favored to be infectious or inflammatory.    Review of Systems: Patient complains of symptoms per HPI as well as the following symptoms tremor.  Pertinent negatives and positives per HPI. All others negative.   Social History   Socioeconomic History   Marital status: Married    Spouse name: Not on file   Number of children: Not on file   Years of education: Not on file   Highest education level: Not on file  Occupational History   Not on file  Tobacco Use   Smoking status: Some Days    Current packs/day: 1.00    Types: Cigarettes   Smokeless tobacco: Never   Tobacco comments:    >5 cigarettes per day  Vaping Use   Vaping status: Never Used  Substance and Sexual Activity   Alcohol use: Yes    Alcohol/week: 0.0 standard drinks of alcohol    Comment: Occasional to Rare   Drug use: No   Sexual activity: Not on file  Other Topics Concern   Not on file  Social History Narrative   Not on file   Social Drivers of Health   Financial Resource Strain: Not on file  Food Insecurity: No Food Insecurity (03/05/2021)   Received from [facility], [facility]   Hunger Vital Sign    Worried About Running Out of Food in the Last Year: Never true    Ran Out of Food in the Last Year: Never true  Transportation Needs: Not on file  Physical Activity: Not on file  Stress: Not on file  Social Connections: Unknown (03/01/2022)   Received from [facility], [facility]   Social Network    Social Network: Not on file  Intimate Partner Violence: Unknown (01/21/2022)   Received from [facility], [facility]   HITS    Physically Hurt: Not on file    Insult or Talk Down To: Not on file    Threaten Physical Harm: Not on file    Scream or Curse: Not on file    Family History  Problem Relation Age of Onset   Hypertension Mother    Cancer - Other Father        Oropharyngeal SCCa   CAD Brother        Deceased on MI age 88   Seizures Maternal Uncle    Alzheimer's disease Paternal Aunt    Alzheimer's disease Paternal Uncle     Past Medical History:  Diagnosis Date   Acute diverticulitis 03/30/2020   Acute ST elevation  myocardial infarction (STEMI) involving other coronary artery of inferior wall (HCC) 09/23/2021   Acute ST elevation myocardial infarction (STEMI) of inferior wall (HCC) 09/22/2021   Asthma    Benign essential HTN    Claudication in peripheral vascular disease (HCC)    Coronary artery disease    Hyperlipidemia    IBS (irritable bowel syndrome)    Migraine headache     Patient Active Problem List   Diagnosis Date Noted   H/O total shoulder replacement, left 09/16/2021   COPD with chronic bronchitis (HCC) 03/30/2020   Sepsis (HCC) 03/30/2020   Diverticulitis 03/30/2020   Anaphylactic reaction 03/23/2020   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   Renal artery stenosis, non-flow-limiting (HCC) 06/14/2016   Coronary artery disease involving native coronary artery of native heart with unstable angina pectoris (HCC) 04/29/2016   Palpitations 04/22/2016   Overweight (BMI 25.0-29.9) 01/22/2015   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   Essential hypertension 01/19/2015  Chronic pain syndrome 01/19/2015   GASTROINTESTINAL XRAY, ABNORMAL 01/05/2010   GERD 12/25/2009   BREAST MASS 10/24/2009   ANXIETY DEPRESSION 09/17/2009   WEIGHT LOSS 09/17/2009   ABDOMINAL PAIN -GENERALIZED 09/03/2009   CLOSTRIDIUM DIFFICILE COLITIS 06/16/2009   BACK PAIN, LUMBAR 06/09/2009   Hereditary and idiopathic peripheral neuropathy 05/28/2009   CONTUSION, HIP, RIGHT 05/28/2009   SEBACEOUS CYST 02/13/2009   Backache 02/13/2009   DYSPLASTIC NEVUS, BACK 09/18/2008   Anxiety state 08/28/2008   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   DEGENERATIVE DISC DISEASE, 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   OTHER DYSPHAGIA 10/30/2007   IRRITABLE BOWEL SYNDROME 09/29/2007   TOBACCO ABUSE 07/17/2007   Migraine 07/17/2007   Hyperlipidemia 03/22/2007   PERIPHERAL VASCULAR DISEASE 03/22/2007   Asthma 03/22/2007   Diverticulosis of colon 03/22/2007    Past Surgical History:  Procedure Laterality Date   ABDOMINAL AORTOGRAM W/LOWER EXTREMITY N/A  01/28/2023   Procedure: ABDOMINAL AORTOGRAM W/LOWER EXTREMITY;  Surgeon: Knox Perl, MD;  Location: [facility] INVASIVE CV LAB;  Service: Cardiovascular;  Laterality: N/A;   CORONARY/GRAFT ACUTE MI REVASCULARIZATION N/A 09/22/2021   Procedure: Coronary/Graft Acute MI Revascularization;  Surgeon: Knox Perl, MD;  Location: [facility] INVASIVE CV [facility];  Service: Cardiovascular;  Laterality: N/A;   LEFT HEART CATH AND CORONARY ANGIOGRAPHY N/A 09/22/2021   Procedure: LEFT HEART CATH AND CORONARY ANGIOGRAPHY;  Surgeon: Knox Perl, MD;  Location: [facility] INVASIVE CV [facility];  Service: Cardiovascular;  Laterality: N/A;   LOWER EXTREMITY ANGIOGRAPHY  02/02/2024   Procedure: Lower Extremity Angiography;  Surgeon: Knox Perl, MD;  Location: [facility] INVASIVE CV LAB;  Service: Cardiovascular;;   LOWER EXTREMITY INTERVENTION  02/02/2024   Procedure: LOWER EXTREMITY INTERVENTION;  Surgeon: Knox Perl, MD;  Location: [facility];  Service: Cardiovascular;;  RT SFA atherectomy and DCB   PERIPHERAL VASCULAR ATHERECTOMY  01/28/2023   Procedure: PERIPHERAL VASCULAR ATHERECTOMY;  Surgeon: Knox Perl, MD;  Location: [facility] INVASIVE CV LAB;  Service: Cardiovascular;;   PERIPHERAL VASCULAR BALLOON ANGIOPLASTY  01/28/2023   Procedure: PERIPHERAL VASCULAR BALLOON ANGIOPLASTY;  Surgeon: Knox Perl, MD;  Location: [facility] INVASIVE CV [facility];  Service: Cardiovascular;;   REVERSE SHOULDER ARTHROPLASTY Left 09/16/2021   Procedure: REVERSE SHOULDER ARTHROPLASTY;  Surgeon: Winston Hawking, MD;  Location: [facility];  Service: Orthopedics;  Laterality: Left;    Current Outpatient Medications  Medication Sig Dispense Refill   AIRSUPRA 90-80 MCG/ACT AERO Inhale 2 puffs into the lungs daily as needed (Asthma).     albuterol  (PROVENTIL  HFA;VENTOLIN  HFA) 108 (90 BASE) MCG/ACT inhaler Inhale 2 puffs into the lungs 3 (three) times daily as needed for wheezing or shortness of breath.     apixaban  (ELIQUIS ) 5 MG TABS tablet Take 1 tablet (5 mg total) by mouth 2 (two) times daily. 180  tablet 3   atorvastatin  (LIPITOR ) 80 MG tablet Take 80 mg by mouth daily.     Budeson-Glycopyrrol-Formoterol (BREZTRI  AEROSPHERE) 160-9-4.8 MCG/ACT AERO Inhale 2 puffs into the lungs in the morning and at bedtime. 10.7 g 6   clopidogrel  (PLAVIX ) 75 MG tablet Take 75 mg by mouth daily.     cyanocobalamin (VITAMIN B12) 1000 MCG/ML injection Inject 1,000 mcg as directed every 30 (thirty) days.     dapagliflozin  propanediol (FARXIGA ) 10 MG TABS tablet Take 1 tablet (10 mg total) by mouth daily. 90 tablet 3   diltiazem  (CARDIZEM  CD) 120 MG 24 hr capsule Take 1 capsule (120 mg total) by mouth daily. 90 capsule 3   diphenoxylate-atropine (LOMOTIL) 2.5-0.025 MG tablet Take 1 tablet by  mouth 2 (two) times daily as needed for diarrhea or loose stools.     ezetimibe  (ZETIA ) 10 MG tablet Take 1 tablet (10 mg total) by mouth daily. (Patient taking differently: Take 10 mg by mouth at bedtime.) 90 tablet 3   ferrous sulfate  325 (65 FE) MG tablet Take 325 mg by mouth 3 (three) times a week.     fluticasone  (FLONASE ) 50 MCG/ACT nasal spray Place 1 spray into both nostrils daily.     furosemide  (LASIX ) 20 MG tablet Take 20 mg by mouth daily as needed for fluid or edema.     losartan  (COZAAR ) 50 MG tablet Take 1 tablet (50 mg total) by mouth every evening. 90 tablet 3   MAGNESIUM  PO Take 500 mg by mouth daily.     metoprolol  tartrate (LOPRESSOR ) 50 MG tablet Take 1 tablet (50 mg total) by mouth 2 (two) times daily. 180 tablet 3   nitroGLYCERIN  (NITROSTAT ) 0.4 MG SL tablet Place 0.4 mg under the tongue every 5 (five) minutes as needed for chest pain.     potassium chloride  SA (KLOR-CON  M) 20 MEQ tablet Take 20 mEq by mouth daily as needed (Takes with Lasix ).     pregabalin (LYRICA) 75 MG capsule Take 75 mg by mouth 3 (three) times daily.     promethazine  (PHENERGAN ) 12.5 MG tablet Take 12.5 mg by mouth 3 (three) times daily as needed for nausea or vomiting.     Vitamin D , Ergocalciferol , (DRISDOL ) 1.25 MG (50000  UNIT) CAPS capsule Take 50,000 Units by mouth once a week.     EPINEPHrine  0.3 mg/0.3 mL IJ SOAJ injection Inject 0.3 mg into the muscle as needed for anaphylaxis. (Patient not taking: Reported on 01/26/2024)     No current facility-administered medications for this visit.    Allergies as of 02/20/2024 - Review Complete 02/20/2024  Allergen Reaction Noted   Cefepime  Anaphylaxis, Shortness Of Breath, Swelling, and Dermatitis 03/22/2020   Edoxaban Other (See Comments) 03/22/2020   Penicillins Anaphylaxis 04/02/2021   Barium iodid Rash 12/07/2013   Other  02/13/2014   Acetaminophen   10/04/2006   Albuterol  Hypertension 09/13/2018   Bactrim [sulfamethoxazole-trimethoprim]  03/26/2009   Beta adrenergic blockers  02/21/2020   Prednisone   12/14/2007   Propofol   10/04/2006   Repatha [evolocumab] Other (See Comments) 03/24/2020    Vitals: BP (!) 168/80   Pulse 81   Ht 5\' 3"  (1.6 m)   Wt 126 lb (57.2 kg)   BMI 22.32 kg/m  Last Weight:  Wt Readings from Last 1 Encounters:  02/20/24 126 lb (57.2 kg)   Last Height:   Ht Readings from Last 1 Encounters:  02/20/24 5\' 3"  (1.6 m)    Physical exam: Exam: Gen: NAD, conversant, well nourised, thin, well groomed                     CV: RRR, no MRG. No Carotid Bruits. Distally warm, nontender Eyes: Conjunctivae clear without exudates or hemorrhage  Neuro: Detailed Neurologic Exam  Speech:    Speech is normal; fluent and spontaneous with normal comprehension.  Cognition:     09/20/2023   10:18 AM  MMSE - Mini Mental State Exam  Orientation to time 4  Orientation to Place 5  Registration 3  Attention/ Calculation 5  Recall 3  Language- name 2 objects 2  Language- repeat 1  Language- follow 3 step command 3  Language- read & follow direction 1  Write a sentence 1  Copy design  0  Total score 28       09/21/2023   10:57 AM  Montreal Cognitive Assessment   Visuospatial/ Executive (0/5) 4  Naming (0/3) 3  Attention: Read  list of digits (0/2) 2  Attention: Read list of letters (0/1) 1  Attention: Serial 7 subtraction starting at 100 (0/3) 3  Language: Repeat phrase (0/2) 2  Language : Fluency (0/1) 1  Abstraction (0/2) 1  Delayed Recall (0/5) 5  Orientation (0/6) 6  Total 28   Exam: NAD, pleasant                  Speech:    Speech is normal; fluent and spontaneous with normal comprehension.  Cognition:    The patient is oriented to person, place, and time;     recent and remote memory intact;     language fluent;    Cranial Nerves:    The pupils are equal, round, and reactive to light.Trigeminal sensation is intact and the muscles of mastication are normal. The face is symmetric. The palate elevates in the midline. Hearing intact. Voice is normal. Shoulder shrug is normal. The tongue has normal motion without fasciculations.   Coordination:  No dysmetria  Motor Observation:    No asymmetry, no atrophy, and no involuntary movements noted. Tone:    Normal muscle tone.     Strength:    Strength is V/V in the upper and lower limbs.      Sensation: intact to LT     Assessment/Plan:  Very complicated patient with memory loss which worsened after a fall and concussion here with daughter. has CLOSTRIDIUM DIFFICILE COLITIS; Hyperlipidemia; Anxiety state; ANXIETY DEPRESSION; TOBACCO ABUSE; Migraine; Hereditary and idiopathic peripheral neuropathy; ENDOTHELIAL CORNEAL DYSTROPHY; PERIPHERAL VASCULAR DISEASE; DYSPLASTIC NEVUS, BACK; Asthma; GERD; Diverticulosis of colon; IRRITABLE BOWEL SYNDROME; BREAST MASS; SEBACEOUS CYST; DEGENERATIVE DISC DISEASE, LUMBAR SPINE; BACK PAIN, LUMBAR; Backache; WEIGHT LOSS; OTHER DYSPHAGIA; ABDOMINAL PAIN -GENERALIZED; GASTROINTESTINAL XRAY, ABNORMAL; CONTUSION, HIP, RIGHT; 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HCC); Essential hypertension; Chronic pain syndrome; Overweight (BMI 25.0-29.9); Coronary artery disease involving native coronary artery of native heart with unstable angina pectoris  (HCC); Palpitations; Renal artery stenosis, non-flow-limiting (HCC); Hx of CABG; Anaphylactic reaction; COPD with chronic bronchitis (HCC); Sepsis (HCC); Diverticulitis; and H/O total shoulder replacement, left on their problem list.  She has an appointment with Dr. Annette Barters in June for formal memory testing. But so far all indications do not point to alzheimer's she has not gone to PT for balance. We discussed managing vascular risk factors, she has uncontrolled diabetes(n the ED with fall and memory loss and had glucose > 500) and doesn't eat well and also still smoking despite COPD and on Oxygen at night she follows with pulmonology. Felt better with increased O2 at night states she feels better on 4L of oxygen.    Tremor: Does not appear parkinsonian and no other signs of parkinsonism. Check labs.  Tsh normal, rpr nr, b1 nml, b12 nml, folate nml  2. Imbalance and +romberg, fall: Spinal cord parenchyma shows no abnormal signal intensities.  3. Memory loss made worse after concussion. MRi brain w/wo contrast : MRI scan of the brain with and without contrast showing only mild age-related changes of chronic small vessel disease and mild supratentorial cortical atrophy   4. Blood work including Amyloid and APoe4 testing serum: A high [id number] concentration was observed. A normal beta-amyloid 42/40 ratio and normal concentration of NfL were observed at this  time. These results are not consistent with the  presence of Alzheimer's- related pathology,   9 months with Megan for clinical surveillance   Cc: Trudi Fus, NP,  Trudi Fus, NP  Kayla Amel, MD  [facility] [address] [place], [place] [address]  Phone [phone] Fax [phone]  I spent over 30 minutes of face-to-face and non-face-to-face time with patient on the  1. Cognitive change   2. Tremor   3. Imbalance    diagnosis.  This included previsit chart review, lab review, study review,  order entry, electronic health record documentation, patient education on the different diagnostic and therapeutic options, counseling and coordination of care, risks and benefits of management, compliance, or risk factor reduction

## 2024-03-01 ENCOUNTER — Ambulatory Visit: Admitting: Pulmonary Disease

## 2024-03-01 ENCOUNTER — Encounter: Payer: Self-pay | Admitting: Pulmonary Disease

## 2024-03-01 VITALS — BP 122/60 | HR 78 | Ht 63.5 in | Wt 126.0 lb

## 2024-03-01 DIAGNOSIS — G4734 Idiopathic sleep related nonobstructive alveolar hypoventilation: Secondary | ICD-10-CM | POA: Diagnosis not present

## 2024-03-01 DIAGNOSIS — J4489 Other specified chronic obstructive pulmonary disease: Secondary | ICD-10-CM | POA: Diagnosis not present

## 2024-03-01 DIAGNOSIS — F17210 Nicotine dependence, cigarettes, uncomplicated: Secondary | ICD-10-CM

## 2024-03-01 MED ORDER — METHYLPREDNISOLONE 8 MG PO TABS: 32.0000 mg | ORAL_TABLET | Freq: Every day | ORAL | 0 refills | Status: DC

## 2024-03-01 MED ORDER — BREZTRI AEROSPHERE 160-9-4.8 MCG/ACT IN AERO: INHALATION_SPRAY | RESPIRATORY_TRACT | Status: AC

## 2024-03-01 MED ORDER — VARENICLINE TARTRATE 0.5 MG PO TABS
0.5000 mg | ORAL_TABLET | Freq: Two times a day (BID) | ORAL | 1 refills | Status: DC
Start: 2024-03-01 — End: 2024-04-25

## 2024-03-01 MED ORDER — AZITHROMYCIN 250 MG PO TABS
ORAL_TABLET | ORAL | 0 refills | Status: DC
Start: 2024-03-01 — End: 2024-03-26

## 2024-03-01 NOTE — Patient Instructions (Addendum)
 Start chantix 0.5mg  tablet daily for 7-10 days then increase to twice daily  Use nicotine  patches 14mg  daily  Use mini nicotine  lozenges 2mg  as needed  Call 1-800-quit-NOW to get free nicotine  replacement and counseling from the state of [place]    Continue 3L of oxygen at night  Continue breztri  inhaler 2 puffs twice daily - rinse mouth out after each use  Follow up in 6 months

## 2024-03-01 NOTE — Progress Notes (Signed)
 Synopsis: Referred in February 2023 for Hospital Follow up   Subjective:   PATIENT ID: [name] GENDER: female DOB: 1956/04/16, MRN: [id number]  HPI  Chief Complaint  Patient presents with   Follow-up   [name] is a 68 year old woman, daily smoker with COPD. DMII, GERD and hypertension who returns to pulmonary clinic for COPD.  She experiences exacerbated breathing difficulties with increased wheezing. Sneezing began at 11 PM last night and continued until 6 AM this morning. She uses Breztri , two puffs twice a day, and has an albuterol  inhaler and Airsupra for relief.  Her smoking has increased to a pack a day, despite previously reducing to five cigarettes a day. She has tried Chantix, nicotine  patches, and lozenges in the past.  She uses oxygen at night and feels better since discontinuing one of her blood thinners. Her daughter prefers her to use oxygen 24/7, but she currently only uses it at night.  Simple walk test: SpO2 97% on room air SpO2 86% on room air during lap 2 SpO2 93% on 3L pulsed via POC with ambulation  OV 11/23/22 She has been using breztri  2 puffs twice per day and feeling better with it. She continues to have intermittent dyspnea. She is using 3L of O2 at night.   Review cardiology note from 11/11/22.  She is smoking less than 5 cigarettes per day. She was smoking 1 ppd for 50 years.   Initial OV 11/20/21 She was admitted 12/7 - 12/16 for STEMI s/p PCI to RCA, atrial fibrillation with RVR, and acute respiratory failure in setting of COPD exacerbation and pneumonia. Echo demonstarted moderate to severe mitral regurgitation concerning for papillary muscle dysfunction secondary to ischemia.   She was discharged home on 1L of home O2 and stiolto inhaler with as needed albuterol  inhaler. Stopped using oxygen during day 1 week ago due to a sore in her nose. She continues to use O2 at night when sleeping  She has issues with allergies/molds so  depending on the season she has harder time with her breathing. Perfumes can trigger her breathing. She avoids aerosol products, as these have bothered her breathing since childhood. She was treated for asthma when she was younger.   She has not smoked since the hospital admission. She is on 7mg  nicotine  patches as needed and using nicotine  lozenges as needed. She lives with her husband on a [place] farm. Her physical activity is limited due to sciatica symptoms.  Past Medical History:  Diagnosis Date   Acute diverticulitis 03/30/2020   Acute ST elevation myocardial infarction (STEMI) involving other coronary artery of inferior wall (HCC) 09/23/2021   Acute ST elevation myocardial infarction (STEMI) of inferior wall (HCC) 09/22/2021   Asthma    Benign essential HTN    Claudication in peripheral vascular disease (HCC)    Coronary artery disease    Hyperlipidemia    IBS (irritable bowel syndrome)    Migraine headache      Family History  Problem Relation Age of Onset   Hypertension Mother    Cancer - Other Father        Oropharyngeal SCCa   CAD Brother        Deceased on MI age 33   Seizures Maternal Uncle    Alzheimer's disease Paternal Aunt    Alzheimer's disease Paternal Uncle      Social History   Socioeconomic History   Marital status: Married    Spouse name: Not on file  Number of children: Not on file   Years of education: Not on file   Highest education level: Not on file  Occupational History   Not on file  Tobacco Use   Smoking status: Some Days    Current packs/day: 1.00    Types: Cigarettes   Smokeless tobacco: Never   Tobacco comments:    >5 cigarettes per day  Vaping Use   Vaping status: Never Used  Substance and Sexual Activity   Alcohol use: Yes    Alcohol/week: 0.0 standard drinks of alcohol    Comment: Occasional to Rare   Drug use: No   Sexual activity: Not on file  Other Topics Concern   Not on file  Social History Narrative   Not on file    Social Drivers of Health   Financial Resource Strain: Not on file  Food Insecurity: No Food Insecurity (03/05/2021)   Received from [facility], [facility]   Hunger Vital Sign    Worried About Running Out of Food in the Last Year: Never true    Ran Out of Food in the Last Year: Never true  Transportation Needs: Not on file  Physical Activity: Not on file  Stress: Not on file  Social Connections: Unknown (03/01/2022)   Received from [facility], [facility]   Social Network    Social Network: Not on file  Intimate Partner Violence: Unknown (01/21/2022)   Received from [facility], [facility]   HITS    Physically Hurt: Not on file    Insult or Talk Down To: Not on file    Threaten Physical Harm: Not on file    Scream or Curse: Not on file     Allergies  Allergen Reactions   Cefepime  Anaphylaxis, Shortness Of Breath, Swelling and Dermatitis   Edoxaban Other (See Comments)    Causes hematomas   Penicillins Anaphylaxis   Barium Iodid Rash   Other     Preservatives  (C-Diff)    Acetaminophen      Will not take after Cyanide deaths, something changed when they mkt'd again.  Can take Childrens Liquid but sometimes upset stomach too   Albuterol  Hypertension    ABLE TO TOLERATE IN INHALER FORM NOT NEBULIZER    Bactrim [Sulfamethoxazole-Trimethoprim]     REACTION: antibiotic induced colitis   Beta Adrenergic Blockers     Paradoxical reactions   Prednisone       ibs problems   Propofol      REACTION: FEVER   Repatha [Evolocumab] Other (See Comments)    Knocked her out for 10 days     Outpatient Medications Prior to Visit  Medication Sig Dispense Refill   AIRSUPRA 90-80 MCG/ACT AERO Inhale 2 puffs into the lungs daily as needed (Asthma).     albuterol  (PROVENTIL  HFA;VENTOLIN  HFA) 108 (90 BASE) MCG/ACT inhaler Inhale 2 puffs into the lungs 3 (three) times daily as needed for wheezing or shortness of breath.     apixaban  (ELIQUIS ) 5 MG TABS tablet Take 1 tablet (5 mg  total) by mouth 2 (two) times daily. 180 tablet 3   atorvastatin  (LIPITOR ) 80 MG tablet Take 80 mg by mouth daily.     Budeson-Glycopyrrol-Formoterol (BREZTRI  AEROSPHERE) 160-9-4.8 MCG/ACT AERO Inhale 2 puffs into the lungs in the morning and at bedtime. 10.7 g 6   clopidogrel  (PLAVIX ) 75 MG tablet Take 75 mg by mouth daily.     cyanocobalamin (VITAMIN B12) 1000 MCG/ML injection Inject 1,000 mcg as directed every 30 (thirty) days.  dapagliflozin  propanediol (FARXIGA ) 10 MG TABS tablet Take 1 tablet (10 mg total) by mouth daily. 90 tablet 3   diphenoxylate-atropine (LOMOTIL) 2.5-0.025 MG tablet Take 1 tablet by mouth 2 (two) times daily as needed for diarrhea or loose stools.     EPINEPHrine  0.3 mg/0.3 mL IJ SOAJ injection Inject 0.3 mg into the muscle as needed for anaphylaxis.     ezetimibe  (ZETIA ) 10 MG tablet Take 1 tablet (10 mg total) by mouth daily. (Patient taking differently: Take 10 mg by mouth at bedtime.) 90 tablet 3   ferrous sulfate  325 (65 FE) MG tablet Take 325 mg by mouth 3 (three) times a week.     fluticasone  (FLONASE ) 50 MCG/ACT nasal spray Place 1 spray into both nostrils daily.     furosemide  (LASIX ) 20 MG tablet Take 20 mg by mouth daily as needed for fluid or edema.     losartan  (COZAAR ) 50 MG tablet Take 1 tablet (50 mg total) by mouth every evening. 90 tablet 3   MAGNESIUM  PO Take 500 mg by mouth daily.     metoprolol  tartrate (LOPRESSOR ) 50 MG tablet Take 1 tablet (50 mg total) by mouth 2 (two) times daily. 180 tablet 3   nitroGLYCERIN  (NITROSTAT ) 0.4 MG SL tablet Place 0.4 mg under the tongue every 5 (five) minutes as needed for chest pain.     potassium chloride  SA (KLOR-CON  M) 20 MEQ tablet Take 20 mEq by mouth daily as needed (Takes with Lasix ).     pregabalin (LYRICA) 75 MG capsule Take 75 mg by mouth 3 (three) times daily.     promethazine  (PHENERGAN ) 12.5 MG tablet Take 12.5 mg by mouth 3 (three) times daily as needed for nausea or vomiting.     Vitamin D ,  Ergocalciferol , (DRISDOL ) 1.25 MG (50000 UNIT) CAPS capsule Take 50,000 Units by mouth once a week.     diltiazem  (CARDIZEM  CD) 120 MG 24 hr capsule Take 1 capsule (120 mg total) by mouth daily. (Patient not taking: Reported on 03/01/2024) 90 capsule 3   No facility-administered medications prior to visit.   Review of Systems  Constitutional:  Negative for chills, fever, malaise/fatigue and weight loss.  HENT:  Negative for congestion, sinus pain and sore throat.   Eyes: Negative.   Respiratory:  Positive for cough, shortness of breath and wheezing. Negative for hemoptysis and sputum production.   Cardiovascular:  Negative for chest pain, palpitations, orthopnea, claudication and leg swelling.  Gastrointestinal:  Negative for abdominal pain, heartburn, nausea and vomiting.  Genitourinary: Negative.   Musculoskeletal:  Negative for joint pain and myalgias.  Skin:  Negative for rash.  Neurological:  Negative for weakness.  Endo/Heme/Allergies: Negative.   Psychiatric/Behavioral: Negative.      Objective:   Vitals:   03/01/24 1335  BP: 122/60  Pulse: 78  SpO2: 93%  Weight: 126 lb (57.2 kg)  Height: 5' 3.5" (1.613 m)   Physical Exam Constitutional:      General: She is not in acute distress.    Appearance: She is not ill-appearing.  HENT:     Head: Normocephalic and atraumatic.  Eyes:     General: No scleral icterus.    Conjunctiva/sclera: Conjunctivae normal.  Cardiovascular:     Rate and Rhythm: Normal rate and regular rhythm.     Pulses: Normal pulses.     Heart sounds: Normal heart sounds. No murmur heard. Pulmonary:     Effort: Pulmonary effort is normal.     Breath sounds: Normal breath sounds.  Decreased air movement present. No wheezing, rhonchi or rales.  Musculoskeletal:     Right lower leg: No edema.     Left lower leg: No edema.  Skin:    General: Skin is warm and dry.  Neurological:     General: No focal deficit present.     Mental Status: She is alert.      CBC    Component Value Date/Time   WBC 11.9 (H) 01/23/2024 0920   WBC 15.2 (H) 06/23/2023 1238   RBC 4.15 01/23/2024 0920   RBC 3.96 06/23/2023 1238   HGB 10.8 (L) 01/23/2024 0920   HCT 34.4 01/23/2024 0920   PLT 379 01/23/2024 0920   MCV 83 01/23/2024 0920   MCH 26.0 (L) 01/23/2024 0920   MCH 27.3 06/23/2023 1238   MCHC 31.4 (L) 01/23/2024 0920   MCHC 30.4 06/23/2023 1238   RDW 15.2 01/23/2024 0920   LYMPHSABS 2.2 10/28/2022 1340   MONOABS 0.7 10/28/2022 1340   EOSABS 0.1 10/28/2022 1340   BASOSABS 0.1 10/28/2022 1340      Latest Ref Rng & Units 01/23/2024    9:20 AM 12/16/2023    9:36 AM 09/20/2023   11:32 AM  BMP  Glucose 70 - 99 mg/dL 528  97  413   BUN 8 - 27 mg/dL 20  10  13    Creatinine 0.57 - 1.00 mg/dL 2.44  0.10  2.72   BUN/Creat Ratio 12 - 28 18  10     Sodium 134 - 144 mmol/L 136  140    Potassium 3.5 - 5.2 mmol/L 3.7  4.8    Chloride 96 - 106 mmol/L 95  103    CO2 20 - 29 mmol/L 23  21    Calcium  8.7 - 10.3 mg/dL 9.3  8.9     Chest imaging: CTA Chest 10/01/21 1. No evidence of pulmonary embolism. 2. Cardiomegaly with small bilateral pleural effusions lying dependently and some passive subsegmental atelectasis in the lower lobes of the lungs bilaterally. 3. Patchy areas of ground-glass attenuation in the lungs, most evident in the left upper lobe near the apex. These may be of infectious or inflammatory etiology. 4. Aortic atherosclerosis, in addition to three-vessel coronary artery disease. Status post median sternotomy for CABG including LIMA to the LAD. 5. Moderate-sized hiatal hernia. 6. Additional incidental findings, as above.  PFT:    Latest Ref Rng & Units 11/25/2022   10:58 AM  PFT Results  FVC-Pre L 2.01   FVC-Predicted Pre % 66   FVC-Post L 1.98   FVC-Predicted Post % 65   Pre FEV1/FVC % % 49   Post FEV1/FCV % % 50   FEV1-Pre L 0.98   FEV1-Predicted Pre % 42   FEV1-Post L 0.98   DLCO uncorrected ml/min/mmHg 9.54   DLCO UNC% %  49   DLCO corrected ml/min/mmHg 10.07   DLCO COR %Predicted % 52   DLVA Predicted % 65   TLC L 4.83   TLC % Predicted % 98   RV % Predicted % 121     Labs:  Path:  Echo 09/23/21: LV EF 45-50%. Grade III diastolic dysfunction. RV size and function is normal. RVSP 50.11mmHg. Moderate to moderately severe posteriorly directed MR.   Heart Catheterization:  Assessment & Plan:   Asthma-COPD overlap syndrome (HCC) - Plan: methylPREDNISolone  (MEDROL ) 8 MG tablet, azithromycin  (ZITHROMAX ) 250 MG tablet  Cigarette smoker - Plan: varenicline (CHANTIX) 0.5 MG tablet  Nocturnal hypoxemia  Discussion: [name] is a  68 year old woman, daily smoker with COPD. DMII, GERD and hypertension who returns to pulmonary clinic for asthma-COPD overlap syndrome.   Asthma-COPD with Exacerbation - Continue Breztri  inhaler, two puffs twice daily. - Continue albuterol  inhaler as needed. - Prescribe Medrol  Dosepak (methylprednisolone ) for 5 days. - Prescribe azithromycin  (Z-Pak) for potential infection.  Chronic Hypoxemic Respiratory Failure - continue supplemental oxygen 3L at night with sleep - Order POC at 3L pulsed based on walk test today  Nicotine  dependence Discussed smoking cessation options for 3 minutes. Motivated to quit with a modified approach using Chantix and nicotine  replacement therapy. Discussed potential side effects. - Prescribe Chantix (varenicline) 0.5 mg tablets, start with 0.5 mg once daily, then increase to 0.5 mg twice daily after 7-10 days - Recommend nicotine  patches at 14 mg dose. - Advise use of 2 mg mini nicotine  lozenges as needed.  Follow up in 6 months  Duaine German, MD [facility] Office: [phone]   Current Outpatient Medications:    AIRSUPRA 90-80 MCG/ACT AERO, Inhale 2 puffs into the lungs daily as needed (Asthma)., Disp: , Rfl:    albuterol  (PROVENTIL  HFA;VENTOLIN  HFA) 108 (90 BASE) MCG/ACT inhaler, Inhale 2 puffs into the  lungs 3 (three) times daily as needed for wheezing or shortness of breath., Disp: , Rfl:    apixaban  (ELIQUIS ) 5 MG TABS tablet, Take 1 tablet (5 mg total) by mouth 2 (two) times daily., Disp: 180 tablet, Rfl: 3   atorvastatin  (LIPITOR ) 80 MG tablet, Take 80 mg by mouth daily., Disp: , Rfl:    azithromycin  (ZITHROMAX ) 250 MG tablet, Take as directed, Disp: 6 tablet, Rfl: 0   Budeson-Glycopyrrol-Formoterol (BREZTRI  AEROSPHERE) 160-9-4.8 MCG/ACT AERO, Inhale 2 puffs into the lungs in the morning and at bedtime., Disp: 10.7 g, Rfl: 6   clopidogrel  (PLAVIX ) 75 MG tablet, Take 75 mg by mouth daily., Disp: , Rfl:    cyanocobalamin (VITAMIN B12) 1000 MCG/ML injection, Inject 1,000 mcg as directed every 30 (thirty) days., Disp: , Rfl:    dapagliflozin  propanediol (FARXIGA ) 10 MG TABS tablet, Take 1 tablet (10 mg total) by mouth daily., Disp: 90 tablet, Rfl: 3   diphenoxylate-atropine (LOMOTIL) 2.5-0.025 MG tablet, Take 1 tablet by mouth 2 (two) times daily as needed for diarrhea or loose stools., Disp: , Rfl:    EPINEPHrine  0.3 mg/0.3 mL IJ SOAJ injection, Inject 0.3 mg into the muscle as needed for anaphylaxis., Disp: , Rfl:    ezetimibe  (ZETIA ) 10 MG tablet, Take 1 tablet (10 mg total) by mouth daily. (Patient taking differently: Take 10 mg by mouth at bedtime.), Disp: 90 tablet, Rfl: 3   ferrous sulfate  325 (65 FE) MG tablet, Take 325 mg by mouth 3 (three) times a week., Disp: , Rfl:    fluticasone  (FLONASE ) 50 MCG/ACT nasal spray, Place 1 spray into both nostrils daily., Disp: , Rfl:    furosemide  (LASIX ) 20 MG tablet, Take 20 mg by mouth daily as needed for fluid or edema., Disp: , Rfl:    losartan  (COZAAR ) 50 MG tablet, Take 1 tablet (50 mg total) by mouth every evening., Disp: 90 tablet, Rfl: 3   MAGNESIUM  PO, Take 500 mg by mouth daily., Disp: , Rfl:    methylPREDNISolone  (MEDROL ) 8 MG tablet, Take 4 tablets (32 mg total) by mouth daily., Disp: 20 tablet, Rfl: 0   metoprolol  tartrate (LOPRESSOR ) 50  MG tablet, Take 1 tablet (50 mg total) by mouth 2 (two) times daily., Disp: 180 tablet, Rfl: 3  nitroGLYCERIN  (NITROSTAT ) 0.4 MG SL tablet, Place 0.4 mg under the tongue every 5 (five) minutes as needed for chest pain., Disp: , Rfl:    potassium chloride  SA (KLOR-CON  M) 20 MEQ tablet, Take 20 mEq by mouth daily as needed (Takes with Lasix )., Disp: , Rfl:    pregabalin (LYRICA) 75 MG capsule, Take 75 mg by mouth 3 (three) times daily., Disp: , Rfl:    promethazine  (PHENERGAN ) 12.5 MG tablet, Take 12.5 mg by mouth 3 (three) times daily as needed for nausea or vomiting., Disp: , Rfl:    varenicline (CHANTIX) 0.5 MG tablet, Take 1 tablet (0.5 mg total) by mouth 2 (two) times daily., Disp: 60 tablet, Rfl: 1   Vitamin D , Ergocalciferol , (DRISDOL ) 1.25 MG (50000 UNIT) CAPS capsule, Take 50,000 Units by mouth once a week., Disp: , Rfl:    diltiazem  (CARDIZEM  CD) 120 MG 24 hr capsule, Take 1 capsule (120 mg total) by mouth daily. (Patient not taking: Reported on 03/01/2024), Disp: 90 capsule, Rfl: 3

## 2024-03-05 ENCOUNTER — Ambulatory Visit (HOSPITAL_COMMUNITY)
Admission: RE | Admit: 2024-03-05 | Discharge: 2024-03-05 | Disposition: A | Discharge status: Home / Self Care | Source: Ambulatory Visit | Attending: Cardiology | Admitting: Cardiology

## 2024-03-05 DIAGNOSIS — I739 Peripheral vascular disease, unspecified: Secondary | ICD-10-CM

## 2024-03-06 ENCOUNTER — Ambulatory Visit: Payer: Self-pay | Admitting: Cardiology

## 2024-03-06 NOTE — Progress Notes (Signed)
 ABI improved since angioplasty

## 2024-03-08 LAB — VAS US ABI WITH/WO TBI
Left ABI: 0.76
Right ABI: 0.98

## 2024-03-14 ENCOUNTER — Ambulatory Visit: Admitting: Cardiology

## 2024-03-15 NOTE — Telephone Encounter (Signed)
 Patient has not read my chart message.  Call placed to patient. Left message to call office

## 2024-03-16 NOTE — Telephone Encounter (Signed)
 Pt returning call, requesting cb

## 2024-03-16 NOTE — Progress Notes (Signed)
 Sometimes performing angioplasty can lead to discomfort due to improved flow.  Her ABIs reveal significant improvement in flow on her right lower extremity which has now normalized.  So I am not concerned, excellent response to therapy.

## 2024-03-20 NOTE — Progress Notes (Signed)
 Cardiology Office Note:  .   Date:  03/26/2024  ID:  [name], DOB 31-Dec-1955, MRN [id number] PCP: Trudi Fus, NP  [facility] Providers Cardiologist:  Knox Perl, MD   History of Present Illness: .   [name] is a 68 y.o. 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  Discussed the use of AI scribe software for clinical note transcription with the patient, who gave verbal consent to proceed.  History of Present Illness [name] is a 68 year old female with peripheral arterial disease who presents with leg swelling and dizziness.  She experiences significant leg swelling since revascularization of the right leg requiring her to lay down with her legs elevated. The swelling persists today, accompanied by pain along her leg, described as being next to the bone.  Dizziness occurs, especially when bending at the waist  She is currently tolerating her cholesterol medication well. She is taking Eliquis  and has stopped taking Plavix . She was previously on clopidogrel  until it was discontinued before her angioplasty but I had restarted this postangioplasty but she had missed this. She confirms having a bottle of clopidogrel  at home.  She confirms taking Zetia  at night for cholesterol management.  Labs   Lab Results  Component Value Date   CHOL 145 12/16/2023   HDL 58 12/16/2023   LDLCALC 70 12/16/2023   LDLDIRECT 173.8 10/24/2009   TRIG 90 12/16/2023   CHOLHDL 2.5 12/16/2023   Lab Results  Component Value Date   NA 136 01/23/2024    K 3.7 01/23/2024   CO2 23 01/23/2024   GLUCOSE 234 (H) 01/23/2024   BUN 20 01/23/2024   CREATININE 1.11 (H) 01/23/2024   CALCIUM  9.3 01/23/2024   EGFR 55 (L) 01/23/2024   GFRNONAA 53 (L) 06/23/2023      Latest Ref Rng & Units 01/23/2024    9:20 AM 12/16/2023    9:36 AM 09/20/2023   11:32 AM  BMP  Glucose 70 - 99 mg/dL 528  97  413   BUN 8 - 27 mg/dL 20  10  13    Creatinine 0.57 - 1.00 mg/dL 2.44  0.10  2.72   BUN/Creat Ratio 12 - 28 18  10     Sodium 134 - 144 mmol/L 136  140    Potassium 3.5 - 5.2 mmol/L 3.7  4.8    Chloride 96 - 106 mmol/L 95  103    CO2 20 - 29 mmol/L 23  21    Calcium  8.7 - 10.3 mg/dL 9.3  8.9        Latest Ref Rng & Units 01/23/2024    9:20 AM 12/16/2023    9:36 AM 06/23/2023    3:37 PM  CBC  WBC 3.4 - 10.8 x10E3/uL 11.9  12.2    Hemoglobin 11.1 - 15.9 g/dL 53.6  64.4  03.4   Hematocrit 34.0 - 46.6 % 34.4  34.4  34.0   Platelets 150 - 450 x10E3/uL 379  375     Lab Results  Component Value Date   HGBA1C 6.7 (H) 09/17/2021    Lab  Results  Component Value Date   TSH 1.340 09/20/2023     ROS  Review of Systems  Cardiovascular:  Negative for chest pain, dyspnea on exertion and leg swelling.    Physical Exam:   VS:  BP (!) 148/70 (BP Location: Left Arm, Patient Position: Sitting)   Pulse 72   Ht 5\' 3"  (1.6 m)   Wt 125 lb (56.7 kg)   SpO2 94%   BMI 22.14 kg/m    Wt Readings from Last 3 Encounters:  03/26/24 125 lb (56.7 kg)  03/01/24 126 lb (57.2 kg)  02/20/24 126 lb (57.2 kg)    Physical Exam Neck:     Vascular: No carotid bruit or JVD.  Cardiovascular:     Rate and Rhythm: Normal rate and regular rhythm.     Pulses: Intact distal pulses.     Heart sounds: Normal heart sounds. No murmur heard.    No gallop.  Pulmonary:     Effort: Pulmonary effort is normal.     Breath sounds: Normal breath sounds.  Abdominal:     General: Bowel sounds are normal.     Palpations: Abdomen is soft.  Musculoskeletal:     Right lower leg: No edema.      Left lower leg: No edema.    Studies Reviewed: Aaron Aas      ABI 03/05/2024     EKG:         Medications and allergies    Allergies  Allergen Reactions   Cefepime  Anaphylaxis, Shortness Of Breath, Swelling and Dermatitis   Edoxaban Other (See Comments)    Causes hematomas   Penicillins Anaphylaxis   Barium Iodid Rash   Other     Preservatives  (C-Diff)    Acetaminophen      Will not take after Cyanide deaths, something changed when they mkt'd again.  Can take Childrens Liquid but sometimes upset stomach too   Albuterol  Hypertension    ABLE TO TOLERATE IN INHALER FORM NOT NEBULIZER    Bactrim [Sulfamethoxazole-Trimethoprim]     REACTION: antibiotic induced colitis   Beta Adrenergic Blockers     Paradoxical reactions   Prednisone       ibs problems   Propofol      REACTION: FEVER   Repatha [Evolocumab] Other (See Comments)    Knocked her out for 10 days     Current Outpatient Medications:    AIRSUPRA 90-80 MCG/ACT AERO, Inhale 2 puffs into the lungs daily as needed (Asthma)., Disp: , Rfl:    albuterol  (PROVENTIL  HFA;VENTOLIN  HFA) 108 (90 BASE) MCG/ACT inhaler, Inhale 2 puffs into the lungs 3 (three) times daily as needed for wheezing or shortness of breath., Disp: , Rfl:    apixaban  (ELIQUIS ) 5 MG TABS tablet, Take 1 tablet (5 mg total) by mouth 2 (two) times daily., Disp: 180 tablet, Rfl: 3   atorvastatin  (LIPITOR ) 80 MG tablet, Take 80 mg by mouth daily., Disp: , Rfl:    Budeson-Glycopyrrol-Formoterol (BREZTRI  AEROSPHERE) 160-9-4.8 MCG/ACT AERO, Inhale 2 puffs into the lungs in the morning and at bedtime., Disp: 10.7 g, Rfl: 6   budesonide -glycopyrrolate-formoterol (BREZTRI  AEROSPHERE) 160-9-4.8 MCG/ACT AERO inhaler, 4 samples, Disp: , Rfl:    cyanocobalamin (VITAMIN B12) 1000 MCG/ML injection, Inject 1,000 mcg as directed every 30 (thirty) days., Disp: , Rfl:    dapagliflozin  propanediol (FARXIGA ) 10 MG TABS tablet, Take 1 tablet (10 mg total) by mouth daily., Disp: 90  tablet, Rfl: 3   diltiazem  (CARDIZEM  CD) 120 MG 24 hr capsule, Take 1  capsule (120 mg total) by mouth daily., Disp: 90 capsule, Rfl: 3   EPINEPHrine  0.3 mg/0.3 mL IJ SOAJ injection, Inject 0.3 mg into the muscle as needed for anaphylaxis., Disp: , Rfl:    ezetimibe  (ZETIA ) 10 MG tablet, Take 1 tablet (10 mg total) by mouth daily., Disp: 90 tablet, Rfl: 3   ferrous sulfate  325 (65 FE) MG tablet, Take 325 mg by mouth 3 (three) times a week., Disp: , Rfl:    fluticasone  (FLONASE ) 50 MCG/ACT nasal spray, Place 1 spray into both nostrils daily., Disp: , Rfl:    furosemide  (LASIX ) 20 MG tablet, Take 20 mg by mouth daily as needed for fluid or edema., Disp: , Rfl:    losartan  (COZAAR ) 50 MG tablet, Take 1 tablet (50 mg total) by mouth every evening., Disp: 90 tablet, Rfl: 3   MAGNESIUM  PO, Take 500 mg by mouth daily., Disp: , Rfl:    metoprolol  tartrate (LOPRESSOR ) 50 MG tablet, Take 1 tablet (50 mg total) by mouth 2 (two) times daily., Disp: 180 tablet, Rfl: 3   nitroGLYCERIN  (NITROSTAT ) 0.4 MG SL tablet, Place 0.4 mg under the tongue every 5 (five) minutes as needed for chest pain., Disp: , Rfl:    pantoprazole  (PROTONIX ) 40 MG tablet, Take 1 tablet (40 mg total) by mouth daily before breakfast., Disp: 90 tablet, Rfl: 0   potassium chloride  SA (KLOR-CON  M) 20 MEQ tablet, Take 20 mEq by mouth daily as needed (Takes with Lasix )., Disp: , Rfl:    pregabalin (LYRICA) 75 MG capsule, Take 75 mg by mouth 3 (three) times daily., Disp: , Rfl:    promethazine  (PHENERGAN ) 12.5 MG tablet, Take 12.5 mg by mouth 3 (three) times daily as needed for nausea or vomiting., Disp: , Rfl:    varenicline  (CHANTIX ) 0.5 MG tablet, Take 1 tablet (0.5 mg total) by mouth 2 (two) times daily., Disp: 60 tablet, Rfl: 1   Vitamin D , Ergocalciferol , (DRISDOL ) 1.25 MG (50000 UNIT) CAPS capsule, Take 50,000 Units by mouth once a week., Disp: , Rfl:    clopidogrel  (PLAVIX ) 75 MG tablet, Take 1 tablet (75 mg total) by mouth daily., Disp: 90  tablet, Rfl: 0   Meds ordered this encounter  Medications   clopidogrel  (PLAVIX ) 75 MG tablet    Sig: Take 1 tablet (75 mg total) by mouth daily.    Dispense:  90 tablet    Refill:  0   pantoprazole  (PROTONIX ) 40 MG tablet    Sig: Take 1 tablet (40 mg total) by mouth daily before breakfast.    Dispense:  90 tablet    Refill:  0     Medications Discontinued During This Encounter  Medication Reason   clopidogrel  (PLAVIX ) 75 MG tablet Reorder   methylPREDNISolone  (MEDROL ) 8 MG tablet Completed Course   diphenoxylate-atropine (LOMOTIL) 2.5-0.025 MG tablet Completed Course   azithromycin  (ZITHROMAX ) 250 MG tablet Completed Course     ASSESSMENT AND PLAN: .      ICD-10-CM   1. Claudication in peripheral vascular disease (HCC)  I73.9 clopidogrel  (PLAVIX ) 75 MG tablet    pantoprazole  (PROTONIX ) 40 MG tablet    2. Coronary artery disease of native artery of native heart with stable angina pectoris (HCC)  I25.118     3. Paroxysmal atrial fibrillation (HCC)  I48.0 pantoprazole  (PROTONIX ) 40 MG tablet      Assessment and Plan Assessment & Plan Peripheral Artery Disease (PAD) with Claudication Significant PAD with previous orbital atherectomy performed three months ago, resulting in improved  ABI from 0.44 to 0.98, indicating normalized blood flow. Swelling and pain were present, but circulation is normalized with no remaining blockage and excellent pulse. - Reinstate Plavix  for 90 days although it is already been 3 months since angioplasty and she has good dorsalis pedis pulse and ABI are normal.  Still in view of significant PAD, will use this for 90 more days and also start her on Protonix  for GI prophylaxis. - Encourage ambulation as much as possible. - Order repeat ABI in three months.  Coronary Artery Disease (CAD) No recent angina reported. Cholesterol levels are well-controlled with current statin therapy. Aspirin  is not required due to Plavix  and Eliquis  use to avoid bleeding  complications. - Continue current statin therapy. - Monitor for any recurrence of angina.  Paroxysmal Atrial Fibrillation Maintaining regular rhythm with current treatment. On Eliquis  for stroke prevention. Blood pressure slightly elevated today but generally well-controlled. Consideration given to potential bleeding risks due to dual antiplatelet therapy. - Continue Eliquis  for stroke prevention. - Monitor blood pressure; if persistently >130 mmHg, contact provider for potential medication adjustment. - Start Protonix  for 90 days to prevent gastrointestinal bleeding due to dual antiplatelet therapy.  Hyperlipidemia Cholesterol levels are well-controlled with current medication regimen, including Ezetimibe  10 mg send atorvastatin  80 mg daily, which is being taken regularly without issues. - Continue current cholesterol medication regimen.  Office visit in 6 months.   Signed,  Knox Perl, MD, [facility] 03/26/2024, 11:33 AM [facility] [address] [place], [place] [address] Phone: [phone]. Fax:  [phone]

## 2024-03-26 ENCOUNTER — Encounter: Payer: Self-pay | Admitting: Cardiology

## 2024-03-26 ENCOUNTER — Ambulatory Visit: Attending: Internal Medicine | Admitting: Cardiology

## 2024-03-26 VITALS — BP 148/70 | HR 72 | Ht 63.0 in | Wt 125.0 lb

## 2024-03-26 DIAGNOSIS — I25118 Atherosclerotic heart disease of native coronary artery with other forms of angina pectoris: Secondary | ICD-10-CM | POA: Diagnosis not present

## 2024-03-26 DIAGNOSIS — I739 Peripheral vascular disease, unspecified: Secondary | ICD-10-CM

## 2024-03-26 DIAGNOSIS — I480 Paroxysmal atrial fibrillation: Secondary | ICD-10-CM | POA: Diagnosis not present

## 2024-03-26 MED ORDER — PANTOPRAZOLE SODIUM 40 MG PO TBEC: 40.0000 mg | DELAYED_RELEASE_TABLET | Freq: Every day | ORAL | 0 refills | Status: AC

## 2024-03-26 MED ORDER — CLOPIDOGREL BISULFATE 75 MG PO TABS
75.0000 mg | ORAL_TABLET | Freq: Every day | ORAL | 0 refills | Status: DC
Start: 2024-03-26 — End: 2024-06-15

## 2024-03-26 NOTE — Patient Instructions (Signed)
 Medication Instructions:  The current medical regimen is effective;  continue present plan and medications.  *If you need a refill on your cardiac medications before your next appointment, please call your pharmacy*  Testing/Procedures: Your physician has requested that you have an ankle brachial index (ABI) in 3 months. During this test an ultrasound and blood pressure cuff are used to evaluate the arteries that supply the arms and legs with blood. Allow thirty minutes for this exam. There are no restrictions or special instructions.  Please note: We ask at that you not bring children with you during ultrasound (echo/ vascular) testing. Due to room size and safety concerns, children are not allowed in the ultrasound rooms during exams. Our front office staff cannot provide observation of children in our lobby area while testing is being conducted. An adult accompanying a patient to their appointment will only be allowed in the ultrasound room at the discretion of the ultrasound technician under special circumstances. We apologize for any inconvenience.   Follow-Up: At [facility], you and your health needs are our priority.  As part of our continuing mission to provide you with exceptional heart care, our providers are all part of one team.  This team includes your primary Cardiologist (physician) and Advanced Practice Providers or APPs (Physician Assistants and Nurse Practitioners) who all work together to provide you with the care you need, when you need it.  Your next appointment:   6 month(s)  Provider:   Knox Perl, MD    We recommend signing up for the patient portal called "MyChart".  Sign up information is provided on this After Visit Summary.  MyChart is used to connect with patients for Virtual Visits (Telemedicine).  Patients are able to view lab/test results, encounter notes, upcoming appointments, etc.  Non-urgent messages can be sent to your provider as well.   To learn  more about what you can do with MyChart, go to [email or web address].

## 2024-04-16 ENCOUNTER — Ambulatory Visit: Admitting: Emergency Medicine

## 2024-04-25 ENCOUNTER — Other Ambulatory Visit: Payer: Self-pay

## 2024-04-25 DIAGNOSIS — F17210 Nicotine dependence, cigarettes, uncomplicated: Secondary | ICD-10-CM

## 2024-04-25 MED ORDER — VARENICLINE TARTRATE 0.5 MG PO TABS
0.5000 mg | ORAL_TABLET | Freq: Two times a day (BID) | ORAL | 3 refills | Status: AC
Start: 2024-04-25 — End: ?

## 2024-04-27 ENCOUNTER — Emergency Department (HOSPITAL_BASED_OUTPATIENT_CLINIC_OR_DEPARTMENT_OTHER)

## 2024-04-27 ENCOUNTER — Emergency Department (HOSPITAL_BASED_OUTPATIENT_CLINIC_OR_DEPARTMENT_OTHER)
Admission: EM | Admit: 2024-04-27 | Discharge: 2024-04-27 | Disposition: A | Discharge status: Home / Self Care | Attending: Emergency Medicine | Admitting: Emergency Medicine

## 2024-04-27 ENCOUNTER — Encounter (HOSPITAL_BASED_OUTPATIENT_CLINIC_OR_DEPARTMENT_OTHER): Payer: Self-pay | Admitting: Emergency Medicine

## 2024-04-27 ENCOUNTER — Other Ambulatory Visit: Payer: Self-pay

## 2024-04-27 DIAGNOSIS — Z7901 Long term (current) use of anticoagulants: Secondary | ICD-10-CM | POA: Diagnosis not present

## 2024-04-27 DIAGNOSIS — Z955 Presence of coronary angioplasty implant and graft: Secondary | ICD-10-CM | POA: Diagnosis not present

## 2024-04-27 DIAGNOSIS — S91012A Laceration without foreign body, left ankle, initial encounter: Secondary | ICD-10-CM | POA: Diagnosis not present

## 2024-04-27 DIAGNOSIS — Z7951 Long term (current) use of inhaled steroids: Secondary | ICD-10-CM | POA: Diagnosis not present

## 2024-04-27 DIAGNOSIS — M25572 Pain in left ankle and joints of left foot: Secondary | ICD-10-CM

## 2024-04-27 DIAGNOSIS — J449 Chronic obstructive pulmonary disease, unspecified: Secondary | ICD-10-CM | POA: Insufficient documentation

## 2024-04-27 DIAGNOSIS — M79671 Pain in right foot: Secondary | ICD-10-CM | POA: Insufficient documentation

## 2024-04-27 DIAGNOSIS — S99912A Unspecified injury of left ankle, initial encounter: Secondary | ICD-10-CM | POA: Diagnosis present

## 2024-04-27 DIAGNOSIS — S81812A Laceration without foreign body, left lower leg, initial encounter: Secondary | ICD-10-CM

## 2024-04-27 DIAGNOSIS — W19XXXA Unspecified fall, initial encounter: Secondary | ICD-10-CM | POA: Insufficient documentation

## 2024-04-27 MED ORDER — LIDOCAINE-EPINEPHRINE (PF) 2 %-1:200000 IJ SOLN
10.0000 mL | Freq: Once | INTRAMUSCULAR | Status: AC
  Administered 2024-04-27: 10 mL
  Filled 2024-04-27: qty 20

## 2024-04-27 NOTE — ED Provider Notes (Signed)
 [name] EMERGENCY DEPARTMENT AT [facility] Provider Note   CSN: [id number] Arrival date & time: 04/27/24  1154     Patient presents with: [place]   [name] is a 68 y.o. female.   Patient with history of CABG, anticoagulation use, COPD --presents to the emergency department today for evaluation of injury sustained during a fall occurring around 830-9 AM this morning.  Patient states that she had just woken up and walked about 20 steps.  Her leg gave out and she fell.  She twisted her left ankle and had pain in her right foot after the fall.  She did not lose consciousness.  She did not hit her head.  Patient noted bleeding through her compression socks on the left ankle.  She bandaged this with nonstick dressing and Coban at home.  Patient presented for evaluation.       Prior to Admission medications   Medication Sig Start Date End Date Taking? Authorizing Provider  AIRSUPRA 90-80 MCG/ACT AERO Inhale 2 puffs into the lungs daily as needed (Asthma). 01/09/24   [provider]  albuterol  (PROVENTIL  HFA;VENTOLIN  HFA) 108 (90 BASE) MCG/ACT inhaler Inhale 2 puffs into the lungs 3 (three) times daily as needed for wheezing or shortness of breath.    [provider]  apixaban  (ELIQUIS ) 5 MG TABS tablet Take 1 tablet (5 mg total) by mouth 2 (two) times daily. 01/30/23   Ladona Heinz, MD  atorvastatin  (LIPITOR ) 80 MG tablet Take 80 mg by mouth daily.    [provider]  Budeson-Glycopyrrol-Formoterol (BREZTRI  AEROSPHERE) 160-9-4.8 MCG/ACT AERO Inhale 2 puffs into the lungs in the morning and at bedtime. 11/23/22   Kara Dorn NOVAK, MD  budesonide -glycopyrrolate-formoterol (BREZTRI  AEROSPHERE) 160-9-4.8 MCG/ACT AERO inhaler 4 samples 03/01/24   Kara Dorn NOVAK, MD  clopidogrel  (PLAVIX ) 75 MG tablet Take 1 tablet (75 mg total) by mouth daily. 03/26/24 06/24/24  Ladona Heinz, MD  cyanocobalamin (VITAMIN B12) 1000 MCG/ML injection Inject 1,000 mcg as directed  every 30 (thirty) days.    [provider]  dapagliflozin  propanediol (FARXIGA ) 10 MG TABS tablet Take 1 tablet (10 mg total) by mouth daily. 10/14/21   Cantwell, Celeste C, PA-C  diltiazem  (CARDIZEM  CD) 120 MG 24 hr capsule Take 1 capsule (120 mg total) by mouth daily. 10/14/21   Cantwell, Celeste C, PA-C  EPINEPHrine  0.3 mg/0.3 mL IJ SOAJ injection Inject 0.3 mg into the muscle as needed for anaphylaxis. 05/10/23   [provider]  ezetimibe  (ZETIA ) 10 MG tablet Take 1 tablet (10 mg total) by mouth daily. 12/13/23 12/07/24  Ladona Heinz, MD  ferrous sulfate  325 (65 FE) MG tablet Take 325 mg by mouth 3 (three) times a week.    [provider]  fluticasone  (FLONASE ) 50 MCG/ACT nasal spray Place 1 spray into both nostrils daily. 04/12/23   [provider]  furosemide  (LASIX ) 20 MG tablet Take 20 mg by mouth daily as needed for fluid or edema.    [provider]  losartan  (COZAAR ) 50 MG tablet Take 1 tablet (50 mg total) by mouth every evening. 02/14/24   Ladona Heinz, MD  MAGNESIUM  PO Take 500 mg by mouth daily.    [provider]  metoprolol  tartrate (LOPRESSOR ) 50 MG tablet Take 1 tablet (50 mg total) by mouth 2 (two) times daily. 10/14/21   Cantwell, Celeste C, PA-C  nitroGLYCERIN  (NITROSTAT ) 0.4 MG SL tablet Place 0.4 mg under the tongue every 5 (five) minutes as needed for chest pain.  [provider]  pantoprazole  (PROTONIX ) 40 MG tablet Take 1 tablet (40 mg total) by mouth daily before breakfast. 03/26/24   Ladona Heinz, MD  potassium chloride  SA (KLOR-CON  M) 20 MEQ tablet Take 20 mEq by mouth daily as needed (Takes with Lasix ). 06/01/23   [provider]  pregabalin (LYRICA) 75 MG capsule Take 75 mg by mouth 3 (three) times daily.    [provider]  promethazine  (PHENERGAN ) 12.5 MG tablet Take 12.5 mg by mouth 3 (three) times daily as needed for nausea or vomiting. 05/27/23   [provider]  varenicline  (CHANTIX ) 0.5  MG tablet Take 1 tablet (0.5 mg total) by mouth 2 (two) times daily. 04/25/24   Kara Dorn NOVAK, MD  Vitamin D , Ergocalciferol , (DRISDOL ) 1.25 MG (50000 UNIT) CAPS capsule Take 50,000 Units by mouth once a week. 08/31/21   [provider]    Allergies: Cefepime , Edoxaban, Penicillins, Barium iodid, Other, Acetaminophen , Albuterol , Bactrim [sulfamethoxazole-trimethoprim], Beta adrenergic blockers, Prednisone , Propofol , and Repatha [evolocumab]    Review of Systems  Updated Vital Signs BP (!) 150/53   Pulse 76   Temp 97.8 F (36.6 C) (Oral)   Resp 20   SpO2 95%   Physical Exam Vitals and nursing note reviewed.  Constitutional:      Appearance: She is well-developed.  HENT:     Head: Normocephalic and atraumatic.     Nose: Nose normal.     Mouth/Throat:     Mouth: Mucous membranes are moist.  Eyes:     Conjunctiva/sclera: Conjunctivae normal.  Pulmonary:     Effort: No respiratory distress.  Musculoskeletal:     Cervical back: Normal range of motion and neck supple.     Right knee: Normal range of motion. No tenderness.     Left knee: Normal range of motion. No tenderness.     Right ankle: No tenderness. Normal range of motion.     Left ankle: No tenderness. Normal range of motion.     Right foot: Normal range of motion. Tenderness present. No bony tenderness.     Left foot: Decreased range of motion. Tenderness and bony tenderness present.     Comments: Patient notes significant bruising of bilateral feet from a fall about 2 weeks ago.  This is old.  Patient has approximately 10 cm skin tear to the left anterior lateral ankle with retraction of the skin distally.  Wound base does appear clean.  No active bleeding at time of exam.  Patient has tenderness over the bottom of her right foot without swelling.  Skin:    General: Skin is warm and dry.  Neurological:     Mental Status: She is alert.     (all labs ordered are listed, but only abnormal results are  displayed) Labs Reviewed - No data to display  EKG: None  Radiology: No results found.   .Laceration Repair  Date/Time: 04/27/2024 1:54 PM  Performed by: Desiderio Chew, PA-C Authorized by: Desiderio Chew, PA-C   Consent:    Consent obtained:  Verbal   Consent given by:  Patient   Risks discussed:  Infection, pain, poor cosmetic result and poor wound healing Universal protocol:    Patient identity confirmed:  Verbally with patient and provided demographic data Anesthesia:    Anesthesia method:  Local infiltration   Local anesthetic:  Lidocaine  2% WITH epi (8cc) Laceration details:    Location:  Foot   Foot location:  L ankle   Length (cm):  8 Pre-procedure  details:    Preparation:  Patient was prepped and draped in usual sterile fashion and imaging obtained to evaluate for foreign bodies Exploration:    Hemostasis achieved with:  Direct pressure   Imaging obtained: x-ray     Imaging outcome: foreign body not noted     Wound exploration: wound explored through full range of motion     Wound extent: no foreign body   Treatment:    Area cleansed with:  Saline   Amount of cleaning:  Standard Skin repair:    Repair method:  Sutures   Suture size:  4-0   Wound skin closure material used: vicryl.   Suture technique:  Simple interrupted   Number of sutures:  4 Approximation:    Approximation:  Loose Repair type:    Repair type:  Simple Post-procedure details:    Dressing: mepitel dressing.   Procedure completion:  Tolerated well, no immediate complications Comments:     I used sterile water  and gauze to clean the wound gently after anesthesia.  I mobilized the skin flap and gradually reapproximated this as best as possible over the defect.  Given the location on the ankle, this would be likely to retract with movement.  I placed 4 Vicryl sutures to keep the skin reasonably approximated.  Wound was then covered with Mepitel dressing.    Medications Ordered in the ED   lidocaine -EPINEPHrine  (XYLOCAINE  W/EPI) 2 %-1:200000 (PF) injection 10 mL (has no administration in time range)   ED Course  Patient seen and examined. History obtained directly from patient.   Labs/EKG: None ordered  Imaging: Ordered x-ray right foot, left ankle.  Medications/Fluids: Ordered: Lidocaine  2% with epinephrine .   Most recent vital signs reviewed and are as follows: BP (!) 150/53   Pulse 76   Temp 97.8 F (36.6 C) (Oral)   Resp 20   SpO2 95%   Initial impression: Right foot injury, left ankle injury with skin tear  2:21 PM Reassessment performed. Patient appears stable.  Patient tolerated wound cleaning, repair and bandaging without complication.  Nurse applied bacitracin and a bulky dressing to the wound.  Labs personally reviewed and interpreted including: None ordered  Imaging personally visualized and interpreted including: X-ray of the right foot and left ankle were negative for fracture.  Reviewed pertinent lab work and imaging with patient at bedside. Questions answered.   Most current vital signs reviewed and are as follows: BP 122/60 (BP Location: Right Arm)   Pulse 73   Temp 97.8 F (36.6 C) (Oral)   Resp 16   SpO2 99%   Plan: Discharge to home.   Prescriptions written for: None ordered  Other home care instructions discussed: Wound care, remove Mepitel dressing after 10 days.  Stitches will breakdown or be able to be gently removed.    ED return instructions discussed: New or worsening symptoms. Pt urged to return with worsening pain, worsening swelling, expanding area of redness or streaking up extremity, fever, or any other concerns.    Follow-up instructions discussed: Patient encouraged to follow-up with their PCP in 7 days as needed for any persistent symptoms.                                  Medical Decision Making Amount and/or Complexity of Data Reviewed Radiology: ordered.   Patient with fall this morning when her leg gave  out.  Main injury was a large skin tear  on the left ankle.  Imaging of the ankle and right foot were negative.  Wound was cleaned and repaired as best as possible.  Part of this will need to heal by secondary intention.  Tetanus up-to-date.  No concern for head or neck injury today.  Patient is on anticoagulation, bleeding controlled.     Final diagnoses:  Noninfected skin tear of left lower extremity, initial encounter  Acute left ankle pain  Right foot pain  Fall, initial encounter    ED Discharge Orders     None          Desiderio Chew, DEVONNA 04/27/24 1431    Dean Clarity, MD 04/30/24 [phone]

## 2024-04-27 NOTE — ED Triage Notes (Signed)
 Fall This AM,  my leg just went out on me Left ankle and some bleeding seen on compression stockings  Refused wheelchair to exam room, refused to change into gown for exam  Denies LOC or hitting head

## 2024-04-27 NOTE — Discharge Instructions (Signed)
 Please read and follow all provided instructions.  Your diagnoses today include:  1. Noninfected skin tear of left lower extremity, initial encounter   2. Acute left ankle pain   3. Right foot pain   4. Fall, initial encounter     Tests performed today include: X-ray of the affected areas that did not show any foreign bodies or broken bones Vital signs. See below for your results today.   Medications prescribed:  None  Take any prescribed medications only as directed.   Home care instructions:  Follow any educational materials and wound care instructions contained in this packet.   You may leave the Mepitel dressing in place for up to 10 days and remove after that time.  You may place antibiotic ointment on this dressing.  Do not get it wet while attached.  Please monitor for signs of infection.  Keep affected area above the level of your heart when possible to minimize swelling. Wash area gently twice a day with warm soapy water . Do not apply alcohol or hydrogen peroxide. Cover the area if it draining or weeping.   Follow-up instructions: Suture Removal: The sutures that were placed will breakdown and be able to be gently removed in about a week.  Return instructions:  Return to the Emergency Department if you have: Fever Worsening pain Worsening swelling of the wound Pus draining from the wound Redness of the skin that moves away from the wound, especially if it streaks away from the affected area  Any other emergent concerns  Your vital signs today were: BP 122/60 (BP Location: Right Arm)   Pulse 73   Temp 97.8 F (36.6 C) (Oral)   Resp 16   SpO2 99%  If your blood pressure (BP) was elevated above 135/85 this visit, please have this repeated by your doctor within one month. --------------

## 2024-04-27 NOTE — ED Notes (Signed)
Small BP cuff applied.

## 2024-04-27 NOTE — ED Notes (Addendum)
 Pt placed on O2... Family/Pt stated that she is normally on O2 at the house while lying in bed... RRT notified.SABRASABRA

## 2024-04-27 NOTE — ED Notes (Signed)
 DC paperwork given and verbally understood.

## 2024-06-15 ENCOUNTER — Other Ambulatory Visit: Payer: Self-pay

## 2024-06-15 DIAGNOSIS — I739 Peripheral vascular disease, unspecified: Secondary | ICD-10-CM

## 2024-06-15 MED ORDER — CLOPIDOGREL BISULFATE 75 MG PO TABS: 75.0000 mg | ORAL_TABLET | Freq: Every day | ORAL | 2 refills | Status: DC

## 2024-06-23 ENCOUNTER — Inpatient Hospital Stay (HOSPITAL_COMMUNITY)

## 2024-06-23 ENCOUNTER — Encounter (HOSPITAL_COMMUNITY): Payer: Self-pay

## 2024-06-23 ENCOUNTER — Other Ambulatory Visit: Payer: Self-pay

## 2024-06-23 ENCOUNTER — Emergency Department (HOSPITAL_COMMUNITY)

## 2024-06-23 ENCOUNTER — Inpatient Hospital Stay (HOSPITAL_COMMUNITY)
Admission: EM | Admit: 2024-06-23 | Discharge: 2024-06-26 | DRG: 812 | Disposition: A | Discharge status: Home / Self Care | Attending: Internal Medicine | Admitting: Internal Medicine

## 2024-06-23 DIAGNOSIS — L97522 Non-pressure chronic ulcer of other part of left foot with fat layer exposed: Secondary | ICD-10-CM | POA: Diagnosis present

## 2024-06-23 DIAGNOSIS — I340 Nonrheumatic mitral (valve) insufficiency: Secondary | ICD-10-CM | POA: Diagnosis present

## 2024-06-23 DIAGNOSIS — I252 Old myocardial infarction: Secondary | ICD-10-CM

## 2024-06-23 DIAGNOSIS — Z79899 Other long term (current) drug therapy: Secondary | ICD-10-CM | POA: Diagnosis not present

## 2024-06-23 DIAGNOSIS — L97529 Non-pressure chronic ulcer of other part of left foot with unspecified severity: Secondary | ICD-10-CM

## 2024-06-23 DIAGNOSIS — I700 Atherosclerosis of aorta: Secondary | ICD-10-CM | POA: Diagnosis present

## 2024-06-23 DIAGNOSIS — Z9889 Other specified postprocedural states: Secondary | ICD-10-CM

## 2024-06-23 DIAGNOSIS — D72828 Other elevated white blood cell count: Secondary | ICD-10-CM | POA: Diagnosis present

## 2024-06-23 DIAGNOSIS — G43909 Migraine, unspecified, not intractable, without status migrainosus: Secondary | ICD-10-CM | POA: Diagnosis present

## 2024-06-23 DIAGNOSIS — N179 Acute kidney failure, unspecified: Secondary | ICD-10-CM | POA: Diagnosis present

## 2024-06-23 DIAGNOSIS — M879 Osteonecrosis, unspecified: Secondary | ICD-10-CM | POA: Diagnosis present

## 2024-06-23 DIAGNOSIS — Z7902 Long term (current) use of antithrombotics/antiplatelets: Secondary | ICD-10-CM | POA: Diagnosis not present

## 2024-06-23 DIAGNOSIS — I2510 Atherosclerotic heart disease of native coronary artery without angina pectoris: Secondary | ICD-10-CM | POA: Diagnosis present

## 2024-06-23 DIAGNOSIS — Z9181 History of falling: Secondary | ICD-10-CM | POA: Diagnosis not present

## 2024-06-23 DIAGNOSIS — Z1152 Encounter for screening for COVID-19: Secondary | ICD-10-CM | POA: Diagnosis not present

## 2024-06-23 DIAGNOSIS — Z882 Allergy status to sulfonamides status: Secondary | ICD-10-CM

## 2024-06-23 DIAGNOSIS — Z9289 Personal history of other medical treatment: Secondary | ICD-10-CM | POA: Diagnosis not present

## 2024-06-23 DIAGNOSIS — W010XXA Fall on same level from slipping, tripping and stumbling without subsequent striking against object, initial encounter: Secondary | ICD-10-CM | POA: Diagnosis present

## 2024-06-23 DIAGNOSIS — Z886 Allergy status to analgesic agent status: Secondary | ICD-10-CM

## 2024-06-23 DIAGNOSIS — Z951 Presence of aortocoronary bypass graft: Secondary | ICD-10-CM

## 2024-06-23 DIAGNOSIS — Z7901 Long term (current) use of anticoagulants: Secondary | ICD-10-CM

## 2024-06-23 DIAGNOSIS — D62 Acute posthemorrhagic anemia: Secondary | ICD-10-CM | POA: Diagnosis present

## 2024-06-23 DIAGNOSIS — Y92091 Bathroom in other non-institutional residence as the place of occurrence of the external cause: Secondary | ICD-10-CM

## 2024-06-23 DIAGNOSIS — L97509 Non-pressure chronic ulcer of other part of unspecified foot with unspecified severity: Secondary | ICD-10-CM | POA: Diagnosis not present

## 2024-06-23 DIAGNOSIS — L97322 Non-pressure chronic ulcer of left ankle with fat layer exposed: Secondary | ICD-10-CM | POA: Diagnosis present

## 2024-06-23 DIAGNOSIS — R58 Hemorrhage, not elsewhere classified: Secondary | ICD-10-CM | POA: Diagnosis not present

## 2024-06-23 DIAGNOSIS — Z820 Family history of epilepsy and other diseases of the nervous system: Secondary | ICD-10-CM

## 2024-06-23 DIAGNOSIS — I739 Peripheral vascular disease, unspecified: Secondary | ICD-10-CM | POA: Diagnosis not present

## 2024-06-23 DIAGNOSIS — J4489 Other specified chronic obstructive pulmonary disease: Secondary | ICD-10-CM | POA: Diagnosis present

## 2024-06-23 DIAGNOSIS — Z7951 Long term (current) use of inhaled steroids: Secondary | ICD-10-CM

## 2024-06-23 DIAGNOSIS — Z881 Allergy status to other antibiotic agents status: Secondary | ICD-10-CM

## 2024-06-23 DIAGNOSIS — Z7984 Long term (current) use of oral hypoglycemic drugs: Secondary | ICD-10-CM

## 2024-06-23 DIAGNOSIS — Z808 Family history of malignant neoplasm of other organs or systems: Secondary | ICD-10-CM

## 2024-06-23 DIAGNOSIS — R062 Wheezing: Secondary | ICD-10-CM | POA: Diagnosis not present

## 2024-06-23 DIAGNOSIS — E11621 Type 2 diabetes mellitus with foot ulcer: Secondary | ICD-10-CM | POA: Diagnosis present

## 2024-06-23 DIAGNOSIS — Z91041 Radiographic dye allergy status: Secondary | ICD-10-CM

## 2024-06-23 DIAGNOSIS — D649 Anemia, unspecified: Principal | ICD-10-CM

## 2024-06-23 DIAGNOSIS — R61 Generalized hyperhidrosis: Secondary | ICD-10-CM | POA: Diagnosis present

## 2024-06-23 DIAGNOSIS — L03116 Cellulitis of left lower limb: Secondary | ICD-10-CM | POA: Diagnosis present

## 2024-06-23 DIAGNOSIS — S91012A Laceration without foreign body, left ankle, initial encounter: Secondary | ICD-10-CM | POA: Diagnosis present

## 2024-06-23 DIAGNOSIS — F17210 Nicotine dependence, cigarettes, uncomplicated: Secondary | ICD-10-CM | POA: Diagnosis present

## 2024-06-23 DIAGNOSIS — E11622 Type 2 diabetes mellitus with other skin ulcer: Secondary | ICD-10-CM | POA: Diagnosis present

## 2024-06-23 DIAGNOSIS — D500 Iron deficiency anemia secondary to blood loss (chronic): Secondary | ICD-10-CM

## 2024-06-23 DIAGNOSIS — E861 Hypovolemia: Secondary | ICD-10-CM | POA: Diagnosis present

## 2024-06-23 DIAGNOSIS — D509 Iron deficiency anemia, unspecified: Secondary | ICD-10-CM | POA: Diagnosis present

## 2024-06-23 DIAGNOSIS — L97329 Non-pressure chronic ulcer of left ankle with unspecified severity: Secondary | ICD-10-CM | POA: Diagnosis not present

## 2024-06-23 DIAGNOSIS — J449 Chronic obstructive pulmonary disease, unspecified: Secondary | ICD-10-CM | POA: Diagnosis not present

## 2024-06-23 DIAGNOSIS — R059 Cough, unspecified: Secondary | ICD-10-CM | POA: Diagnosis present

## 2024-06-23 DIAGNOSIS — E11628 Type 2 diabetes mellitus with other skin complications: Secondary | ICD-10-CM | POA: Diagnosis not present

## 2024-06-23 DIAGNOSIS — E869 Volume depletion, unspecified: Secondary | ICD-10-CM | POA: Diagnosis not present

## 2024-06-23 DIAGNOSIS — I10 Essential (primary) hypertension: Secondary | ICD-10-CM | POA: Diagnosis present

## 2024-06-23 DIAGNOSIS — E785 Hyperlipidemia, unspecified: Secondary | ICD-10-CM | POA: Diagnosis present

## 2024-06-23 DIAGNOSIS — I6523 Occlusion and stenosis of bilateral carotid arteries: Secondary | ICD-10-CM | POA: Diagnosis present

## 2024-06-23 DIAGNOSIS — I70213 Atherosclerosis of native arteries of extremities with intermittent claudication, bilateral legs: Secondary | ICD-10-CM | POA: Diagnosis present

## 2024-06-23 DIAGNOSIS — R0602 Shortness of breath: Secondary | ICD-10-CM | POA: Diagnosis not present

## 2024-06-23 DIAGNOSIS — Z888 Allergy status to other drugs, medicaments and biological substances status: Secondary | ICD-10-CM

## 2024-06-23 DIAGNOSIS — Z9862 Peripheral vascular angioplasty status: Secondary | ICD-10-CM

## 2024-06-23 DIAGNOSIS — Z87892 Personal history of anaphylaxis: Secondary | ICD-10-CM

## 2024-06-23 DIAGNOSIS — E860 Dehydration: Secondary | ICD-10-CM | POA: Diagnosis present

## 2024-06-23 DIAGNOSIS — S40022A Contusion of left upper arm, initial encounter: Secondary | ICD-10-CM | POA: Diagnosis present

## 2024-06-23 DIAGNOSIS — Y9301 Activity, walking, marching and hiking: Secondary | ICD-10-CM | POA: Diagnosis present

## 2024-06-23 DIAGNOSIS — L02416 Cutaneous abscess of left lower limb: Secondary | ICD-10-CM | POA: Diagnosis present

## 2024-06-23 DIAGNOSIS — I959 Hypotension, unspecified: Secondary | ICD-10-CM | POA: Diagnosis present

## 2024-06-23 DIAGNOSIS — R531 Weakness: Secondary | ICD-10-CM | POA: Diagnosis not present

## 2024-06-23 DIAGNOSIS — I480 Paroxysmal atrial fibrillation: Secondary | ICD-10-CM | POA: Diagnosis present

## 2024-06-23 DIAGNOSIS — E1151 Type 2 diabetes mellitus with diabetic peripheral angiopathy without gangrene: Secondary | ICD-10-CM | POA: Diagnosis present

## 2024-06-23 DIAGNOSIS — Z720 Tobacco use: Secondary | ICD-10-CM | POA: Diagnosis not present

## 2024-06-23 DIAGNOSIS — Z860100 Personal history of colon polyps, unspecified: Secondary | ICD-10-CM

## 2024-06-23 DIAGNOSIS — Z880 Allergy status to penicillin: Secondary | ICD-10-CM

## 2024-06-23 DIAGNOSIS — Z96612 Presence of left artificial shoulder joint: Secondary | ICD-10-CM | POA: Diagnosis present

## 2024-06-23 DIAGNOSIS — M65979 Unspecified synovitis and tenosynovitis, unspecified ankle and foot: Secondary | ICD-10-CM | POA: Diagnosis present

## 2024-06-23 DIAGNOSIS — Z8249 Family history of ischemic heart disease and other diseases of the circulatory system: Secondary | ICD-10-CM

## 2024-06-23 DIAGNOSIS — F419 Anxiety disorder, unspecified: Secondary | ICD-10-CM | POA: Diagnosis present

## 2024-06-23 DIAGNOSIS — Z8719 Personal history of other diseases of the digestive system: Secondary | ICD-10-CM

## 2024-06-23 DIAGNOSIS — K589 Irritable bowel syndrome without diarrhea: Secondary | ICD-10-CM | POA: Diagnosis present

## 2024-06-23 DIAGNOSIS — D72829 Elevated white blood cell count, unspecified: Secondary | ICD-10-CM | POA: Diagnosis not present

## 2024-06-23 LAB — CBC WITH DIFFERENTIAL/PLATELET
Basophils Absolute: 0 K/uL (ref 0.0–0.1)
Basophils Relative: 0 %
Eosinophils Absolute: 0 K/uL (ref 0.0–0.5)
Eosinophils Relative: 0 %
HCT: 16.4 % — ABNORMAL LOW (ref 36.0–46.0)
Hemoglobin: 5.1 g/dL — CL (ref 12.0–15.0)
Lymphocytes Relative: 8 %
Lymphs Abs: 2 K/uL (ref 0.7–4.0)
MCH: 25.6 pg — ABNORMAL LOW (ref 26.0–34.0)
MCHC: 31.1 g/dL (ref 30.0–36.0)
MCV: 82.4 fL (ref 80.0–100.0)
Monocytes Absolute: 1 K/uL (ref 0.1–1.0)
Monocytes Relative: 4 %
Neutro Abs: 22.4 K/uL — ABNORMAL HIGH (ref 1.7–7.7)
Neutrophils Relative %: 88 %
Platelets: 320 K/uL (ref 150–400)
RBC: 1.99 MIL/uL — ABNORMAL LOW (ref 3.87–5.11)
RDW: 18.2 % — ABNORMAL HIGH (ref 11.5–15.5)
WBC: 25.4 K/uL — ABNORMAL HIGH (ref 4.0–10.5)
nRBC: 0.2 % (ref 0.0–0.2)

## 2024-06-23 LAB — COMPREHENSIVE METABOLIC PANEL WITH GFR
ALT: 35 U/L (ref 0–44)
AST: 55 U/L — ABNORMAL HIGH (ref 15–41)
Albumin: 2.8 g/dL — ABNORMAL LOW (ref 3.5–5.0)
Alkaline Phosphatase: 58 U/L (ref 38–126)
Anion gap: 19 — ABNORMAL HIGH (ref 5–15)
BUN: 25 mg/dL — ABNORMAL HIGH (ref 8–23)
CO2: 23 mmol/L (ref 22–32)
Calcium: 8.2 mg/dL — ABNORMAL LOW (ref 8.9–10.3)
Chloride: 89 mmol/L — ABNORMAL LOW (ref 98–111)
Creatinine, Ser: 2.3 mg/dL — ABNORMAL HIGH (ref 0.44–1.00)
GFR, Estimated: 23 mL/min — ABNORMAL LOW (ref >60)
Glucose, Bld: 220 mg/dL — ABNORMAL HIGH (ref 70–99)
Potassium: 4.6 mmol/L (ref 3.5–5.1)
Sodium: 131 mmol/L — ABNORMAL LOW (ref 135–145)
Total Bilirubin: 0.7 mg/dL (ref 0.0–1.2)
Total Protein: 5.1 g/dL — ABNORMAL LOW (ref 6.5–8.1)

## 2024-06-23 LAB — SARS CORONAVIRUS 2 BY RT PCR: SARS Coronavirus 2 by RT PCR: NEGATIVE

## 2024-06-23 LAB — PREPARE RBC (CROSSMATCH)

## 2024-06-23 LAB — ABO/RH: ABO/RH(D): A POS

## 2024-06-23 LAB — I-STAT CHEM 8, ED
BUN: 25 mg/dL — ABNORMAL HIGH (ref 8–23)
Calcium, Ion: 0.99 mmol/L — ABNORMAL LOW (ref 1.15–1.40)
Chloride: 90 mmol/L — ABNORMAL LOW (ref 98–111)
Creatinine, Ser: 2.4 mg/dL — ABNORMAL HIGH (ref 0.44–1.00)
Glucose, Bld: 219 mg/dL — ABNORMAL HIGH (ref 70–99)
HCT: 18 % — ABNORMAL LOW (ref 36.0–46.0)
Hemoglobin: 6.1 g/dL — CL (ref 12.0–15.0)
Potassium: 4.5 mmol/L (ref 3.5–5.1)
Sodium: 130 mmol/L — ABNORMAL LOW (ref 135–145)
TCO2: 25 mmol/L (ref 22–32)

## 2024-06-23 LAB — BRAIN NATRIURETIC PEPTIDE: B Natriuretic Peptide: 547.2 pg/mL — ABNORMAL HIGH (ref 0.0–100.0)

## 2024-06-23 MED ORDER — EZETIMIBE 10 MG PO TABS
10.0000 mg | ORAL_TABLET | Freq: Every day | ORAL | Status: DC
  Administered 2024-06-24 – 2024-06-26 (×3): 10 mg via ORAL
  Filled 2024-06-23 (×4): qty 1

## 2024-06-23 MED ORDER — METOPROLOL TARTRATE 50 MG PO TABS
50.0000 mg | ORAL_TABLET | Freq: Two times a day (BID) | ORAL | Status: DC
Start: 2024-06-23 — End: 2024-06-26
  Administered 2024-06-24 – 2024-06-26 (×5): 50 mg via ORAL
  Filled 2024-06-23 (×6): qty 1

## 2024-06-23 MED ORDER — DILTIAZEM HCL ER COATED BEADS 120 MG PO CP24
120.0000 mg | ORAL_CAPSULE | Freq: Every day | ORAL | Status: DC
Start: 2024-06-23 — End: 2024-06-26
  Administered 2024-06-24 – 2024-06-26 (×3): 120 mg via ORAL
  Filled 2024-06-23 (×4): qty 1

## 2024-06-23 MED ORDER — ALBUTEROL SULFATE (2.5 MG/3ML) 0.083% IN NEBU: 3.0000 mL | INHALATION_SOLUTION | Freq: Three times a day (TID) | RESPIRATORY_TRACT | Status: DC | PRN

## 2024-06-23 MED ORDER — LINEZOLID 600 MG/300ML IV SOLN
600.0000 mg | Freq: Two times a day (BID) | INTRAVENOUS | Status: DC
  Administered 2024-06-23 – 2024-06-25 (×4): 600 mg via INTRAVENOUS
  Filled 2024-06-23 (×4): qty 300

## 2024-06-23 MED ORDER — ONDANSETRON HCL 4 MG/2ML IJ SOLN
4.0000 mg | Freq: Once | INTRAMUSCULAR | Status: AC
  Administered 2024-06-23: 4 mg via INTRAVENOUS
  Filled 2024-06-23: qty 2

## 2024-06-23 MED ORDER — BUDESON-GLYCOPYRROL-FORMOTEROL 160-9-4.8 MCG/ACT IN AERO
2.0000 | INHALATION_SPRAY | Freq: Every day | RESPIRATORY_TRACT | Status: DC
  Administered 2024-06-23 – 2024-06-26 (×4): 2 via RESPIRATORY_TRACT
  Filled 2024-06-23: qty 5.9

## 2024-06-23 MED ORDER — ATORVASTATIN CALCIUM 80 MG PO TABS
80.0000 mg | ORAL_TABLET | Freq: Every day | ORAL | Status: DC
  Administered 2024-06-24 – 2024-06-26 (×3): 80 mg via ORAL
  Filled 2024-06-23 (×4): qty 1

## 2024-06-23 MED ORDER — PANTOPRAZOLE SODIUM 40 MG PO TBEC
40.0000 mg | DELAYED_RELEASE_TABLET | Freq: Every day | ORAL | Status: DC
  Administered 2024-06-24 – 2024-06-26 (×3): 40 mg via ORAL
  Filled 2024-06-23 (×3): qty 1

## 2024-06-23 MED ORDER — PREGABALIN 75 MG PO CAPS
75.0000 mg | ORAL_CAPSULE | Freq: Three times a day (TID) | ORAL | Status: DC
  Administered 2024-06-24 – 2024-06-26 (×8): 75 mg via ORAL
  Filled 2024-06-23 (×8): qty 1

## 2024-06-23 MED ORDER — ACETAMINOPHEN 325 MG PO TABS: 650.0000 mg | ORAL_TABLET | Freq: Four times a day (QID) | ORAL | Status: DC | PRN

## 2024-06-23 MED ORDER — SODIUM CHLORIDE 0.9% IV SOLUTION: Freq: Once | INTRAVENOUS | Status: AC

## 2024-06-23 NOTE — H&P (Cosign Needed)
 Date: 06/23/2024               Patient Name:  [name] MRN: [id number]  DOB: July 09, 1956 Age / Sex: 69 y.o., female   PCP: Benjamine Lauraine DASEN, NP         Medical Service: Internal Medicine Teaching Service         Attending Physician: Dr. Mliss Pouch      First Contact: Rebecka Pion, DO}    Second Contact: Dr. Roetta Chars, MD          Pager Information: First Contact Pager: [phone]   Second Contact Pager: [phone]   SUBJECTIVE   Chief Complaint: Fall  History of Present Illness: [name] is a 68 y.o. female with PMH of came into the ED with complaint of fall.  Patient mentioned that she had a fall 3 nights ago when she twisted on her bathroom rug and fell directly on her left ankle.  She also had a fall 3 weeks ago and had a skin tear in the same ankle.  Patient received sutures for her wound.  Patient said she was doing well after that and had the fall 3 days ago and fell on the same ankle.  She does not remember anything about falling--says she does not remember how it started and does not think she hit her head.  She just remembers having to go to the bathroom really bad in the middle of the night and her body just dropping after she tripped on the bathroom rug.  She denies losing consciousness.  Said her left ankle and foot wounds have now been bleeding a lot and they have been hurting her.  She endorses some chills and productive cough in the past 3 days. Hasn't really eaten in the last three or four days. Has been in a lot of pain in the left foot and ankle. No drainage from that area outside of blood. Every once in a while, she has felt nauseous and sweaty for the past three days. Mentions she has CP which feels more like a chest congestion. Denies any sick contacts. Pt was on 3L [place].   ED Course: Labs significant for: Na: 131 BUN: 25 Cr: 2.30 Anion Gap: 19 Hgb: 5.1 WBC: 25.4 BNP: 547.2  Imaging : Left foot ankle showed mild soft tissue swelling; negative  x-ray for active disease Received 2 units of blood  Consulted IMTS  Meds:  Patient reported:  Medications:  Albuterol  inhaler  Eliquis  5mg  BID?  Atorvastatin  80mg   Breztri  inhaler  Plavix  7mg   Farxiga  10mg   Cardizem  120mg   Zetia  10mg   Lasix  20mg  PRN  Losartan  50mg   Metoprolol  50mg   Protonix  40mg   Lyrica  75mg    Current Meds  Medication Sig   AIRSUPRA 90-80 MCG/ACT AERO Inhale 2 puffs into the lungs daily as needed (Asthma).   albuterol  (PROVENTIL  HFA;VENTOLIN  HFA) 108 (90 BASE) MCG/ACT inhaler Inhale 2 puffs into the lungs 3 (three) times daily as needed for wheezing or shortness of breath.   apixaban  (ELIQUIS ) 5 MG TABS tablet Take 1 tablet (5 mg total) by mouth 2 (two) times daily.   atorvastatin  (LIPITOR ) 80 MG tablet Take 80 mg by mouth daily.   Budeson-Glycopyrrol-Formoterol  (BREZTRI  AEROSPHERE) 160-9-4.8 MCG/ACT AERO Inhale 2 puffs into the lungs in the morning and at bedtime.   budesonide -glycopyrrolate -formoterol  (BREZTRI  AEROSPHERE) 160-9-4.8 MCG/ACT AERO inhaler 4 samples   clopidogrel  (PLAVIX ) 75 MG tablet Take 1 tablet (75 mg total) by mouth daily.   cyanocobalamin (VITAMIN  B12) 1000 MCG/ML injection Inject 1,000 mcg as directed every 30 (thirty) days.   diltiazem  (CARDIZEM  CD) 120 MG 24 hr capsule Take 1 capsule (120 mg total) by mouth daily.   EPINEPHrine  0.3 mg/0.3 mL IJ SOAJ injection Inject 0.3 mg into the muscle as needed for anaphylaxis.   ezetimibe  (ZETIA ) 10 MG tablet Take 1 tablet (10 mg total) by mouth daily.   ferrous sulfate  325 (65 FE) MG tablet Take 325 mg by mouth 3 (three) times a week.   fluticasone  (FLONASE ) 50 MCG/ACT nasal spray Place 1 spray into both nostrils daily.   furosemide  (LASIX ) 20 MG tablet Take 20 mg by mouth daily as needed for fluid or edema.   losartan  (COZAAR ) 50 MG tablet Take 1 tablet (50 mg total) by mouth every evening.   MAGNESIUM  PO Take 500 mg by mouth daily.   metoprolol  tartrate (LOPRESSOR ) 50 MG tablet Take 1 tablet (50  mg total) by mouth 2 (two) times daily.   nitroGLYCERIN  (NITROSTAT ) 0.4 MG SL tablet Place 0.4 mg under the tongue every 5 (five) minutes as needed for chest pain.   pantoprazole  (PROTONIX ) 40 MG tablet Take 1 tablet (40 mg total) by mouth daily before breakfast.   potassium chloride  SA (KLOR-CON  M) 20 MEQ tablet Take 20 mEq by mouth daily as needed (Takes with Lasix ).   pregabalin  (LYRICA ) 75 MG capsule Take 75 mg by mouth 3 (three) times daily.   promethazine  (PHENERGAN ) 12.5 MG tablet Take 12.5 mg by mouth 3 (three) times daily as needed for nausea or vomiting.   Vitamin D , Ergocalciferol , (DRISDOL ) 1.25 MG (50000 UNIT) CAPS capsule Take 50,000 Units by mouth once a week.    Past Medical History COPD, asthma, STEMI, hypertension, hyperlipidemia, claudication peripheral vascular disease, CABG in 2017  Past Surgical History Past Surgical History:  Procedure Laterality Date   ABDOMINAL AORTOGRAM W/LOWER EXTREMITY N/A 01/28/2023   Procedure: ABDOMINAL AORTOGRAM W/LOWER EXTREMITY;  Surgeon: Ladona Heinz, MD;  Location: [facility];  Service: Cardiovascular;  Laterality: N/A;   CORONARY/GRAFT ACUTE MI REVASCULARIZATION N/A 09/22/2021   Procedure: Coronary/Graft Acute MI Revascularization;  Surgeon: Ladona Heinz, MD;  Location: [facility];  Service: Cardiovascular;  Laterality: N/A;   LEFT HEART CATH AND CORONARY ANGIOGRAPHY N/A 09/22/2021   Procedure: LEFT HEART CATH AND CORONARY ANGIOGRAPHY;  Surgeon: Ladona Heinz, MD;  Location: [facility];  Service: Cardiovascular;  Laterality: N/A;   LOWER EXTREMITY ANGIOGRAPHY  02/02/2024   Procedure: Lower Extremity Angiography;  Surgeon: Ladona Heinz, MD;  Location: [facility] CV [facility];  Service: Cardiovascular;;   LOWER EXTREMITY INTERVENTION  02/02/2024   Procedure: LOWER EXTREMITY INTERVENTION;  Surgeon: Ladona Heinz, MD;  Location: [facility];  Service: Cardiovascular;;  RT SFA atherectomy and DCB   PERIPHERAL VASCULAR ATHERECTOMY   01/28/2023   Procedure: PERIPHERAL VASCULAR ATHERECTOMY;  Surgeon: Ladona Heinz, MD;  Location: [facility] IN[facility] CV [facility];  Service: Cardiovascular;;   PERIPHERAL VASCULAR BALLOON ANGIOPLASTY  01/28/2023   Procedure: PERIPHERAL VASCULAR BALLOON ANGIOPLASTY;  Surgeon: Ladona Heinz, MD;  Location: [facility];  Service: Cardiovascular;;   REVERSE SHOULDER ARTHROPLASTY Left 09/16/2021   Procedure: REVERSE SHOULDER ARTHROPLASTY;  Surgeon: Kay Kemps, MD;  Location: [facility];  Service: Orthopedics;  Laterality: Left;     Social:  Lives With: husband  Level of Function: Independent in all ADLs/IADLs except driving in May of 7981 due to anxiety   PCP: Benjamine Lauraine DASEN, NP  Substances -Tobacco: Hasn't smoked  in three days, smoked anywhere between half pack and a pack  -Alcohol: only consumes on special occasions -Recreational Drug: denies  Family History:  Family History  Problem Relation Age of Onset   Hypertension Mother    Cancer - Other Father        Oropharyngeal SCCa   CAD Brother        Deceased on MI age 24   Seizures Maternal Uncle    Alzheimer's disease Paternal Aunt    Alzheimer's disease Paternal Uncle    Dad had triple bypass at same age she had  Allergies: Allergies as of 06/23/2024 - Review Complete 06/23/2024  Allergen Reaction Noted   Cefepime  Anaphylaxis, Shortness Of Breath, Swelling, and Dermatitis 03/22/2020   Edoxaban Other (See Comments) 03/22/2020   Penicillins Anaphylaxis 04/02/2021   Barium iodid Rash 12/07/2013   Other  02/13/2014   Acetaminophen   10/04/2006   Albuterol  Hypertension 09/13/2018   Bactrim [sulfamethoxazole-trimethoprim]  03/26/2009   Beta adrenergic blockers  02/21/2020   Prednisone   12/14/2007   Propofol   10/04/2006   Repatha [evolocumab] Other (See Comments) 03/24/2020    Review of Systems: A complete ROS was negative except as per HPI.   OBJECTIVE:   Physical Exam: Blood pressure (!) 100/54, pulse 93, temperature 99.4 F (37.4 C),  temperature source Oral, resp. rate 14, height 5' 3 (1.6 m), weight 54 kg, SpO2 100%.  Constitutional: pt appeared very weak in bed; A&Ox3  Eyes: conjunctiva non-erythematous Neck: supple Cardiovascular: regular rate and rhythm, no m/r/g Pulmonary/Chest: wheezes heard in posterior lung fields Abdominal: soft, non-tender, non-distended MSK: normal bulk and tone Skin: pale; Wounds on left lateral ankle and dorsum of Left foot with profuse bleeding; no purulent discharge noted; no swelling around the wounds  Labs: CBC    Component Value Date/Time   WBC 25.4 (H) 06/23/2024 1002   RBC 1.99 (L) 06/23/2024 1002   HGB 6.1 (LL) 06/23/2024 1007   HGB 10.8 (L) 01/23/2024 0920   HCT 18.0 (L) 06/23/2024 1007   HCT 34.4 01/23/2024 0920   PLT 320 06/23/2024 1002   PLT 379 01/23/2024 0920   MCV 82.4 06/23/2024 1002   MCV 83 01/23/2024 0920   MCH 25.6 (L) 06/23/2024 1002   MCHC 31.1 06/23/2024 1002   RDW 18.2 (H) 06/23/2024 1002   RDW 15.2 01/23/2024 0920   LYMPHSABS 2.0 06/23/2024 1002   MONOABS 1.0 06/23/2024 1002   EOSABS 0.0 06/23/2024 1002   BASOSABS 0.0 06/23/2024 1002     CMP     Component Value Date/Time   NA 130 (L) 06/23/2024 1007   NA 136 01/23/2024 0920   K 4.5 06/23/2024 1007   CL 90 (L) 06/23/2024 1007   CO2 23 06/23/2024 1002   GLUCOSE 219 (H) 06/23/2024 1007   BUN 25 (H) 06/23/2024 1007   BUN 20 01/23/2024 0920   CREATININE 2.40 (H) 06/23/2024 1007   CREATININE 0.92 10/28/2022 1340   CALCIUM  8.2 (L) 06/23/2024 1002   PROT 5.1 (L) 06/23/2024 1002   ALBUMIN 2.8 (L) 06/23/2024 1002   AST 55 (H) 06/23/2024 1002   AST 13 (L) 10/28/2022 1340   ALT 35 06/23/2024 1002   ALT 9 10/28/2022 1340   ALKPHOS 58 06/23/2024 1002   BILITOT 0.7 06/23/2024 1002   BILITOT 0.2 (L) 10/28/2022 1340   GFRNONAA 23 (L) 06/23/2024 1002   GFRNONAA >60 10/28/2022 1340   GFRAA >60 03/31/2020 0540    Imaging:  DG Chest Portable 1 View Result Date: 06/23/2024  CLINICAL DATA:  Cough  EXAM: PORTABLE CHEST 1 VIEW COMPARISON:  June 23, 2023 FINDINGS: No focal consolidation.  No pleural effusions.  No pneumothorax. Unchanged cardiomediastinal silhouette. Sequelae of prior CABG. Intact sternotomy wires. Partially imaged left shoulder arthroplasty hardware. IMPRESSION: No active disease. Electronically Signed   By: Michaeline Blanch M.D.   On: 06/23/2024 11:38   DG Ankle Left Port Result Date: 06/23/2024 CLINICAL DATA:  Wound infection EXAM: PORTABLE LEFT ANKLE - 2 VIEW COMPARISON:  None Available. FINDINGS: There is no evidence of fracture, dislocation, or joint effusion. There is no evidence of arthropathy or other focal bone abnormality. Mild soft tissue swelling. IMPRESSION: No acute osseous findings. Electronically Signed   By: Michaeline Blanch M.D.   On: 06/23/2024 11:37   DG Foot Complete Left Result Date: 06/23/2024 CLINICAL DATA:  Wound infection EXAM: LEFT FOOT - COMPLETE 3+ VIEW COMPARISON:  Same day ankle radiographs FINDINGS: There is no evidence of fracture or dislocation. There is no evidence of arthropathy or other focal bone abnormality. Mild soft tissue swelling. IMPRESSION: No acute osseous findings. Electronically Signed   By: Michaeline Blanch M.D.   On: 06/23/2024 11:36     EKG: personally reviewed my interpretation is sinus rhythm   ASSESSMENT & PLAN:   Assessment & Plan by Problem: Principal Problem:   Blood loss   [name] is a 68 y.o. person living with a history of COPD, asthma, STEMI, hypertension, hyperlipidemia, claudication peripheral vascular disease, CABG in 2017 who presented with fall and admitted for blood loss on hospital day 0  Blood Loss Patient was anemic with hemoglobin levels on admission 5.1 (baseline 10.8-11.1).  Patient was also hypotensive.  Her hemoglobin levels, hypotension, falls are likely secondary to profuse blood loss from her left foot wounds versus dehydration from less p.o. intake these past few days.  Patient is afebrile and  looks extremely weak. Received transfusion with IV NS. Encourage good PO intake  --received 2 units blood --Recheck CBC  Left foot wounds Patient has developed these recent wounds that appear to be nonhealing in nature. She is a smoker. She has significant PAD and had previous orbital atherectomy performed 6 months ago with normalized blood flow, per cardiology note in June. Pt's current wounds are likely due to her hx of PAD. Will continue her plavix .  Skin around the wounds did not appear to be swollen with purulent discharge.  However, we are concerned about her elevated WBC count which is likely due to an infection 2/2 to cellulitis or osteomyelitis. Will start her on IV abx.  Will get an MRI to check for osteomyelitis. Will check her A1c.  -Continue Plavix  -Linezolid  600 mg IV: Day 1 -MRI of left ankle and foot -A1c ordered -continue Protonix  for GI prophylaxis  AKI Cr on admission 2.3. Likely 2/2 to dehydration vs hypotension. Received NS on admission. Will monitor levels with good PO intake and with transfusion.   COPD Asthma Pt had wheezes on her PE. However, with no fever and negative x-ray we are not concerned about PNA.  --continue albuterol  and brextri PRN  Hx Paroxysmal Atrial Fibrillation On eliquis . Held now for her bleeding.  -eliquis  on hold  HLD: --continue atorvastatin  and ezetimibe    DM Will hold farxiga  due to AKI. Recheck A1c.   HTN BP currently soft. On losartan . Will hold it for now till BP normalizes.   Best practice: Diet: Normal VTE: None IVF: None,None Code: Full  Disposition planning: Prior to Admission Living  Arrangement: home Anticipated Discharge Location: Home  Dispo: Admit patient to Inpatient with expected length of stay greater than 2 midnights.  Signed: Edgardo Pontiff, DO Internal Medicine Resident  06/23/2024, 5:32 PM  On Call pager: [phone]

## 2024-06-23 NOTE — Progress Notes (Signed)
 Patient arrived to the unit from MRI.  Got patient settled and gave report to oncoming nurse Glade Cocker RN.  She will do admission assessment.

## 2024-06-23 NOTE — ED Provider Notes (Signed)
 [place] EMERGENCY DEPARTMENT AT [facility] Provider Note   CSN: [id number] Arrival date & time: 06/23/24  9077     Patient presents with: [place]   [name] is a 68 y.o. female.    Fall  Patient presents after fall a few days ago.  Has had previous fall 3 weeks ago.  Has had bleeding from her left ankle and foot.  Feeling lightheaded.  Appears to be on Eliquis  and Plavix .    Past Medical History:  Diagnosis Date   Acute diverticulitis 03/30/2020   Acute ST elevation myocardial infarction (STEMI) involving other coronary artery of inferior wall (HCC) 09/23/2021   Acute ST elevation myocardial infarction (STEMI) of inferior wall (HCC) 09/22/2021   Asthma    Benign essential HTN    Claudication in peripheral vascular disease (HCC)    Coronary artery disease    Hyperlipidemia    IBS (irritable bowel syndrome)    Migraine headache     Prior to Admission medications   Medication Sig Start Date End Date Taking? Authorizing Provider  AIRSUPRA 90-80 MCG/ACT AERO Inhale 2 puffs into the lungs daily as needed (Asthma). 01/09/24   [provider]  albuterol  (PROVENTIL  HFA;VENTOLIN  HFA) 108 (90 BASE) MCG/ACT inhaler Inhale 2 puffs into the lungs 3 (three) times daily as needed for wheezing or shortness of breath.    [provider]  apixaban  (ELIQUIS ) 5 MG TABS tablet Take 1 tablet (5 mg total) by mouth 2 (two) times daily. 01/30/23   Ladona Heinz, MD  atorvastatin  (LIPITOR ) 80 MG tablet Take 80 mg by mouth daily.    [provider]  Budeson-Glycopyrrol-Formoterol  (BREZTRI  AEROSPHERE) 160-9-4.8 MCG/ACT AERO Inhale 2 puffs into the lungs in the morning and at bedtime. 11/23/22   Kara Dorn NOVAK, MD  budesonide -glycopyrrolate -formoterol  (BREZTRI  AEROSPHERE) 160-9-4.8 MCG/ACT AERO inhaler 4 samples 03/01/24   Kara Dorn NOVAK, MD  clopidogrel  (PLAVIX ) 75 MG tablet Take 1 tablet (75 mg total) by mouth daily. 06/15/24   Ladona Heinz, MD   cyanocobalamin (VITAMIN B12) 1000 MCG/ML injection Inject 1,000 mcg as directed every 30 (thirty) days.    [provider]  dapagliflozin  propanediol (FARXIGA ) 10 MG TABS tablet Take 1 tablet (10 mg total) by mouth daily. 10/14/21   Cantwell, Celeste C, PA-C  diltiazem  (CARDIZEM  CD) 120 MG 24 hr capsule Take 1 capsule (120 mg total) by mouth daily. 10/14/21   Cantwell, Celeste C, PA-C  EPINEPHrine  0.3 mg/0.3 mL IJ SOAJ injection Inject 0.3 mg into the muscle as needed for anaphylaxis. 05/10/23   [provider]  ezetimibe  (ZETIA ) 10 MG tablet Take 1 tablet (10 mg total) by mouth daily. 12/13/23 12/07/24  Ladona Heinz, MD  ferrous sulfate  325 (65 FE) MG tablet Take 325 mg by mouth 3 (three) times a week.    [provider]  fluticasone  (FLONASE ) 50 MCG/ACT nasal spray Place 1 spray into both nostrils daily. 04/12/23   [provider]  furosemide  (LASIX ) 20 MG tablet Take 20 mg by mouth daily as needed for fluid or edema.    [provider]  losartan  (COZAAR ) 50 MG tablet Take 1 tablet (50 mg total) by mouth every evening. 02/14/24   Ladona Heinz, MD  MAGNESIUM  PO Take 500 mg by mouth daily.    [provider]  metoprolol  tartrate (LOPRESSOR ) 50 MG tablet Take 1 tablet (50 mg total) by mouth 2 (two) times daily. 10/14/21   Cantwell, Celeste C, PA-C  nitroGLYCERIN  (NITROSTAT ) 0.4 MG SL  tablet Place 0.4 mg under the tongue every 5 (five) minutes as needed for chest pain.    [provider]  pantoprazole  (PROTONIX ) 40 MG tablet Take 1 tablet (40 mg total) by mouth daily before breakfast. 03/26/24   Ladona Heinz, MD  potassium chloride  SA (KLOR-CON  M) 20 MEQ tablet Take 20 mEq by mouth daily as needed (Takes with Lasix ). 06/01/23   [provider]  pregabalin  (LYRICA ) 75 MG capsule Take 75 mg by mouth 3 (three) times daily.    [provider]  promethazine  (PHENERGAN ) 12.5 MG tablet Take 12.5 mg by mouth 3 (three) times daily as needed for  nausea or vomiting. 05/27/23   [provider]  varenicline  (CHANTIX ) 0.5 MG tablet Take 1 tablet (0.5 mg total) by mouth 2 (two) times daily. 04/25/24   Kara Dorn NOVAK, MD  Vitamin D , Ergocalciferol , (DRISDOL ) 1.25 MG (50000 UNIT) CAPS capsule Take 50,000 Units by mouth once a week. 08/31/21   [provider]    Allergies: Cefepime , Edoxaban, Penicillins, Barium iodid, Other, Acetaminophen , Albuterol , Bactrim [sulfamethoxazole-trimethoprim], Beta adrenergic blockers, Prednisone , Propofol , and Repatha [evolocumab]    Review of Systems  Updated Vital Signs BP 91/79   Pulse 80   Temp 98.4 F (36.9 C) (Oral)   Resp 18   Ht 5' 3 (1.6 m)   Wt 54 kg   SpO2 100%   BMI 21.08 kg/m   Physical Exam Vitals and nursing note reviewed.  Cardiovascular:     Rate and Rhythm: Normal rate.  Abdominal:     Tenderness: There is no abdominal tenderness.  Skin:    Coloration: Skin is pale.     Comments: Wound to left lateral ankle and dorsum of left foot.  Has powder on it that is reportedly to help stop bleeding.  Neurological:     Mental Status: She is alert.        (all labs ordered are listed, but only abnormal results are displayed) Labs Reviewed  COMPREHENSIVE METABOLIC PANEL WITH GFR - Abnormal; Notable for the following components:      Result Value   Sodium 131 (*)    Chloride 89 (*)    Glucose, Bld 220 (*)    BUN 25 (*)    Creatinine, Ser 2.30 (*)    Calcium  8.2 (*)    Total Protein 5.1 (*)    Albumin 2.8 (*)    AST 55 (*)    GFR, Estimated 23 (*)    Anion gap 19 (*)    All other components within normal limits  CBC WITH DIFFERENTIAL/PLATELET - Abnormal; Notable for the following components:   WBC 25.4 (*)    RBC 1.99 (*)    Hemoglobin 5.1 (*)    HCT 16.4 (*)    MCH 25.6 (*)    RDW 18.2 (*)    Neutro Abs 22.4 (*)    All other components within normal limits  I-STAT CHEM 8, ED - Abnormal; Notable for the following components:   Sodium 130 (*)     Chloride 90 (*)    BUN 25 (*)    Creatinine, Ser 2.40 (*)    Glucose, Bld 219 (*)    Calcium , Ion 0.99 (*)    Hemoglobin 6.1 (*)    HCT 18.0 (*)    All other components within normal limits  SARS CORONAVIRUS 2 BY RT PCR  BRAIN NATRIURETIC PEPTIDE  TYPE AND SCREEN  ABO/RH  PREPARE RBC (CROSSMATCH)    EKG: None  Radiology: DG Chest Portable 1 View Result Date: 06/23/2024 CLINICAL DATA:  Cough EXAM: PORTABLE CHEST 1 VIEW COMPARISON:  June 23, 2023 FINDINGS: No focal consolidation.  No pleural effusions.  No pneumothorax. Unchanged cardiomediastinal silhouette. Sequelae of prior CABG. Intact sternotomy wires. Partially imaged left shoulder arthroplasty hardware. IMPRESSION: No active disease. Electronically Signed   By: Michaeline Blanch M.D.   On: 06/23/2024 11:38   DG Ankle Left Port Result Date: 06/23/2024 CLINICAL DATA:  Wound infection EXAM: PORTABLE LEFT ANKLE - 2 VIEW COMPARISON:  None Available. FINDINGS: There is no evidence of fracture, dislocation, or joint effusion. There is no evidence of arthropathy or other focal bone abnormality. Mild soft tissue swelling. IMPRESSION: No acute osseous findings. Electronically Signed   By: Michaeline Blanch M.D.   On: 06/23/2024 11:37   DG Foot Complete Left Result Date: 06/23/2024 CLINICAL DATA:  Wound infection EXAM: LEFT FOOT - COMPLETE 3+ VIEW COMPARISON:  Same day ankle radiographs FINDINGS: There is no evidence of fracture or dislocation. There is no evidence of arthropathy or other focal bone abnormality. Mild soft tissue swelling. IMPRESSION: No acute osseous findings. Electronically Signed   By: Michaeline Blanch M.D.   On: 06/23/2024 11:36     Procedures   Medications Ordered in the ED  0.9 %  sodium chloride  infusion (Manually program via Guardrails IV Fluids) (has no administration in time range)  ondansetron  (ZOFRAN ) injection 4 mg (4 mg Intravenous Given 06/23/24 1026)                                    Medical Decision Making Amount  and/or Complexity of Data Reviewed Labs: ordered. Radiology: ordered.  Risk Prescription drug management. Decision regarding hospitalization.   Patient with bleeding from foot.  Recent wounds.  Does have mild active bleeding.  Is hypotensive.  Hemoglobin now down to 5.  Bleeding appears controlled with pressure at this time.  Blood pressure now around 90 systolic.  Has 2 units of blood ordered.  Creatinine has gone up to 2.4.  Likely due to hypovolemia/the anemia/hypotension.   Sepsis felt less likely.  Does always have elevated white count but now up to 25.  Potentially reactive to the anemia infection of foot considered.  Will discuss with hospitalist for admission.  CRITICAL CARE Performed by: [place] Total critical care time: 30 minutes Critical care time was exclusive of separately billable procedures and treating other patients. Critical care was necessary to treat or prevent imminent or life-threatening deterioration. Critical care was time spent personally by me on the following activities: development of treatment plan with patient and/or surrogate as well as nursing, discussions with consultants, evaluation of patient's response to treatment, examination of patient, obtaining history from patient or surrogate, ordering and performing treatments and interventions, ordering and review of laboratory studies, ordering and review of radiographic studies, pulse oximetry and re-evaluation of patient's condition.      Final diagnoses:  Anemia, unspecified type  Ulcer of left foot, unspecified ulcer stage (HCC)  AKI (acute kidney injury) [facility])    ED Discharge Orders     None          River Rankin, MD 06/23/24 1202

## 2024-06-23 NOTE — ED Notes (Signed)
 MRI sending for Patient and will take Pt upstairs once imaging is completed.

## 2024-06-23 NOTE — Hospital Course (Addendum)
 [name] is a 68 y.o. person living with a history of COPD, asthma, STEMI, hypertension, hyperlipidemia, claudication peripheral vascular disease, CABG in 2017 who presented with fall and admitted for the same.    Fall Pt came in with a hx of fall 3 weeks prior to admission and another one 3 days prior. She tripped and fell on her left ankle and cut her skin both the times. She had 2 wounds on left lateral ankle and dorsal surface of the left foot. Patient was anemic with hemoglobin levels on admission 5.1 (baseline 10.8-11.1).  Patient was also hypotensive.  She notes that when she gets up and turns, she starts feeling dizzy like she's on a roller coaster. Pt thinks its probably due to her metoprolol  use. Her orthostatics were normal. Her hemoglobin levels, hypotension, falls are likely secondary to profuse blood loss from her left foot wounds versus dehydration from less p.o. intake. Patient was afebrile and looked extremely weak. Received transfusion with IV NS. Encourage good PO intake. Her hgb levels stabilized around 8.6-8.7 later and pt said she felt much better than when she came in. She looked stable on her discharge day PE.    Left foot wounds Cellulitis Patient had developed these recent wounds that appeared to be nonhealing in nature. She was a smoker. She was seeing her Cardiologist, Dr. Ladona for her PAD. On admission, the wounds were bleeding profusely and we noticed some swelling in her toes of the left foot. She was starteed on IV linezolid  due to concerns for osteomyelitis/cellulits and also for elevated WBC count which was over her baseline of 12. MRI showed left foot having a possibility of osteonecrosis and small fluid collection which could be an abscess. Continued her linezolid  but changed it to PO. Her ABIs were positive for BL LE arterial disease and her BL TBIs had decreased from her previous exam on 02/2024. Vascular surgery was consulted but asked us  to reach out to pt's  cardiologist Dr. Ladona first. Reached out to Dr. Ladona who said that pt does not have critical limb ischemia and that she had stable claudication. He recommended her for an OP f/u for her wounds. We asked pt to see a podiatrist for wound evaluations but she recommended her PCP sees them almost weekly. Discussed with Dr. Ladona who recommended stopping plavix . Pt was stable today and was ready to be discharged.   Microcytic Anemia  While blood loss from wounds can cause anemia, suspect other causes for her low levels. Her iron panel showed low iron levels and ferritin being on the low-normal end. Will hold starting her on iron repletion IP for concerns of bacterial infection. Asked pt to restart oral iron pills for iron deficiency OP. Pt hasn't had a colonoscopy in the last 10 years. No changes or blood noted in stools. Said she had polyps in last colonoscopy. Asked pt to f/u with GI post discharge. Pt has a hx of taking asprin only when she has some pain in her chest. Asked her to stop taking it as that could increase any bleeding she might have.      AKI Cr on admission 2.3. Likely 2/2 to dehydration vs hypotension. Received NS on admission. Cr levels continued to improve everyday  with good PO intake and after transfusion.    COPD Asthma Pt had wheezes on her PE. However, with no fever and negative x-ray we were not concerned about PNA. Stopped her albuterol  as she mentioned having a negative reaction  with it. She said her SOB was not contoleed with her breztrii so we added triple therapy without the steroid portion due to allergy. Continue home breztrii. F/u with PCP for inhaler modifications.    Hx Paroxysmal Atrial Fibrillation On eliquis  which was held for her bleeding. Continued to receive her home metoprolol  and cardizem . Can continue taking her eliquis  post-discharge.   HLD: Received home atorvastatin  and ezetimibe . Can continue to take it at home.    DM Stopped Farxiga  due to AKI. A1c  rechecked which was 6.0. Can continue with home farxiga .    HTN BP soft on admission. Home losartan  was held till BP normalized.

## 2024-06-23 NOTE — ED Triage Notes (Signed)
 Pt brought in by [facility] with reports of a fall 3 days ago. EMS reports that pt also had a fall 3 weeks ago in which she was seen and had stitches. PT states she is concerned with amount of blood that is coming from her ankle. EMS reports that pt has not been able to sit up or stand without falling recently.  EMS vitals 110/60 84 HR 100% RA CBG 302

## 2024-06-23 NOTE — Care Management (Signed)
 Transition of Care [facility]) - Inpatient Brief Assessment   Patient Details  Name: MELODIE ASHWORTH MRN: [id number] Date of Birth: December 30, 1955  Transition of Care [facility]) CM/SW Contact:    Corean JAYSON Canary, RN Phone Number: 06/23/2024, 11:50 AM   Clinical Narrative:  68 yo history of CABG COPD,  presents to the ED with sizziness and falls. She has been bleeding around the ankle after fall, H& H is low, patient will be admitted for workup. PT and OT consulted to see the patient and give recommendations.  The patient lives with spouse, and had had recent falls, She takes anticoagulation and diuretics, is a higher fall risk.   IP care management will follow for needs, recommendations, and transitions of care  Transition of Care Asessment: Insurance and Status: Insurance coverage has been reviewed Patient has primary care physician: Yes Home environment has been reviewed: Lives with spouse Prior level of function:: But having trouble standing and walking for a couple of weeks. Prior/Current Home Services: No current home services (None in [place]) Social Drivers of Health Review: SDOH reviewed no interventions necessary Readmission risk has been reviewed: Yes Transition of care needs: transition of care needs identified, TOC will continue to follow

## 2024-06-23 NOTE — ED Notes (Signed)
 Patient transported to MRI

## 2024-06-23 NOTE — ED Notes (Signed)
 IV team at bedside

## 2024-06-24 ENCOUNTER — Inpatient Hospital Stay (HOSPITAL_COMMUNITY)

## 2024-06-24 DIAGNOSIS — Z79899 Other long term (current) drug therapy: Secondary | ICD-10-CM

## 2024-06-24 DIAGNOSIS — I10 Essential (primary) hypertension: Secondary | ICD-10-CM

## 2024-06-24 DIAGNOSIS — Z7901 Long term (current) use of anticoagulants: Secondary | ICD-10-CM

## 2024-06-24 DIAGNOSIS — E11628 Type 2 diabetes mellitus with other skin complications: Secondary | ICD-10-CM

## 2024-06-24 DIAGNOSIS — D72829 Elevated white blood cell count, unspecified: Secondary | ICD-10-CM

## 2024-06-24 DIAGNOSIS — I480 Paroxysmal atrial fibrillation: Secondary | ICD-10-CM

## 2024-06-24 DIAGNOSIS — D509 Iron deficiency anemia, unspecified: Secondary | ICD-10-CM | POA: Diagnosis not present

## 2024-06-24 DIAGNOSIS — Z9289 Personal history of other medical treatment: Secondary | ICD-10-CM

## 2024-06-24 DIAGNOSIS — L03116 Cellulitis of left lower limb: Secondary | ICD-10-CM

## 2024-06-24 DIAGNOSIS — R062 Wheezing: Secondary | ICD-10-CM

## 2024-06-24 DIAGNOSIS — Z9181 History of falling: Secondary | ICD-10-CM | POA: Diagnosis not present

## 2024-06-24 DIAGNOSIS — R0602 Shortness of breath: Secondary | ICD-10-CM

## 2024-06-24 DIAGNOSIS — J449 Chronic obstructive pulmonary disease, unspecified: Secondary | ICD-10-CM

## 2024-06-24 DIAGNOSIS — E869 Volume depletion, unspecified: Secondary | ICD-10-CM

## 2024-06-24 DIAGNOSIS — Z7902 Long term (current) use of antithrombotics/antiplatelets: Secondary | ICD-10-CM

## 2024-06-24 DIAGNOSIS — Z7951 Long term (current) use of inhaled steroids: Secondary | ICD-10-CM

## 2024-06-24 DIAGNOSIS — N179 Acute kidney failure, unspecified: Secondary | ICD-10-CM

## 2024-06-24 DIAGNOSIS — L97509 Non-pressure chronic ulcer of other part of unspecified foot with unspecified severity: Secondary | ICD-10-CM | POA: Diagnosis not present

## 2024-06-24 DIAGNOSIS — R531 Weakness: Secondary | ICD-10-CM

## 2024-06-24 DIAGNOSIS — R58 Hemorrhage, not elsewhere classified: Secondary | ICD-10-CM

## 2024-06-24 DIAGNOSIS — E138 Other specified diabetes mellitus with unspecified complications: Secondary | ICD-10-CM

## 2024-06-24 LAB — TYPE AND SCREEN
ABO/RH(D): A POS
Antibody Screen: NEGATIVE
Unit division: 0
Unit division: 0

## 2024-06-24 LAB — CBC
HCT: 25.8 % — ABNORMAL LOW (ref 36.0–46.0)
Hemoglobin: 8.6 g/dL — ABNORMAL LOW (ref 12.0–15.0)
MCH: 25.1 pg — ABNORMAL LOW (ref 26.0–34.0)
MCHC: 33.3 g/dL (ref 30.0–36.0)
MCV: 75.4 fL — ABNORMAL LOW (ref 80.0–100.0)
Platelets: 235 K/uL (ref 150–400)
RBC: 3.42 MIL/uL — ABNORMAL LOW (ref 3.87–5.11)
RDW: 19.2 % — ABNORMAL HIGH (ref 11.5–15.5)
WBC: 22 K/uL — ABNORMAL HIGH (ref 4.0–10.5)
nRBC: 0.2 % (ref 0.0–0.2)

## 2024-06-24 LAB — BASIC METABOLIC PANEL WITH GFR
Anion gap: 11 (ref 5–15)
BUN: 26 mg/dL — ABNORMAL HIGH (ref 8–23)
CO2: 28 mmol/L (ref 22–32)
Calcium: 8 mg/dL — ABNORMAL LOW (ref 8.9–10.3)
Chloride: 89 mmol/L — ABNORMAL LOW (ref 98–111)
Creatinine, Ser: 1.83 mg/dL — ABNORMAL HIGH (ref 0.44–1.00)
GFR, Estimated: 30 mL/min — ABNORMAL LOW (ref >60)
Glucose, Bld: 123 mg/dL — ABNORMAL HIGH (ref 70–99)
Potassium: 3.5 mmol/L (ref 3.5–5.1)
Sodium: 128 mmol/L — ABNORMAL LOW (ref 135–145)

## 2024-06-24 LAB — RESPIRATORY PANEL BY PCR

## 2024-06-24 LAB — BPAM RBC
Blood Product Expiration Date: 202509282359
Blood Product Expiration Date: 202509292359
ISSUE DATE / TIME: 202509061205
ISSUE DATE / TIME: 202509062116
Unit Type and Rh: 6200
Unit Type and Rh: 6200

## 2024-06-24 LAB — RESP PANEL BY RT-PCR (RSV, FLU A&B, COVID)  RVPGX2
Influenza A by PCR: NEGATIVE
Influenza B by PCR: NEGATIVE
Resp Syncytial Virus by PCR: NEGATIVE
SARS Coronavirus 2 by RT PCR: NEGATIVE

## 2024-06-24 LAB — HEMOGLOBIN A1C
Hgb A1c MFr Bld: 6 % — ABNORMAL HIGH (ref 4.8–5.6)
Mean Plasma Glucose: 125.5 mg/dL

## 2024-06-24 LAB — VAS US ABI WITH/WO TBI
Left ABI: 0.64
Right ABI: 0.87

## 2024-06-24 LAB — HIV ANTIBODY (ROUTINE TESTING W REFLEX): HIV Screen 4th Generation wRfx: NONREACTIVE

## 2024-06-24 MED ORDER — OXYCODONE HCL 5 MG PO TABS: 10.0000 mg | ORAL_TABLET | Freq: Four times a day (QID) | ORAL | Status: DC | PRN

## 2024-06-24 MED ORDER — ARFORMOTEROL TARTRATE 15 MCG/2ML IN NEBU
15.0000 ug | INHALATION_SOLUTION | Freq: Two times a day (BID) | RESPIRATORY_TRACT | Status: DC
  Administered 2024-06-24 – 2024-06-26 (×4): 15 ug via RESPIRATORY_TRACT
  Filled 2024-06-24 (×3): qty 2

## 2024-06-24 MED ORDER — LACTATED RINGERS IV BOLUS: 1000.0000 mL | Freq: Once | INTRAVENOUS | Status: DC

## 2024-06-24 MED ORDER — REVEFENACIN 175 MCG/3ML IN SOLN
175.0000 ug | Freq: Every day | RESPIRATORY_TRACT | Status: DC
  Administered 2024-06-25 – 2024-06-26 (×2): 175 ug via RESPIRATORY_TRACT
  Filled 2024-06-24 (×2): qty 3

## 2024-06-24 MED ORDER — POLYETHYLENE GLYCOL 3350 17 G PO PACK
17.0000 g | PACK | Freq: Every day | ORAL | Status: DC
  Administered 2024-06-24 – 2024-06-26 (×3): 17 g via ORAL
  Filled 2024-06-24 (×3): qty 1

## 2024-06-24 MED ORDER — OXYCODONE HCL 5 MG PO TABS
5.0000 mg | ORAL_TABLET | Freq: Four times a day (QID) | ORAL | Status: DC | PRN
  Administered 2024-06-24 – 2024-06-26 (×6): 5 mg via ORAL
  Filled 2024-06-24 (×6): qty 1

## 2024-06-24 NOTE — Progress Notes (Signed)
 HD#1 SUBJECTIVE:  Patient Summary:  [name] is a 68 y.o. 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  Overnight Events: none  Interim History:  Saw pt at bedside this AM. She was feeling much better after the transfusion. However, her left foot wounds still hurt. Denies any fevers, chills. Does endorse difficulty breathing.  OBJECTIVE:  Vital Signs: Vitals:   06/23/24 2139 06/23/24 2346 06/24/24 0025 06/24/24 0601  BP: (!) 112/41 (!) 121/51 (!) 129/53 128/62  Pulse: 81 87 88 93  Resp: 20 20 18 17   Temp: 97.7 F (36.5 C) 98.6 F (37 C) 98.4 F (36.9 C) 98.4 F (36.9 C)  TempSrc: Oral Oral Oral Oral  SpO2: 100% 96% 100% 96%  Weight:      Height:       Supplemental O2: Nasal Cannula SpO2: 96 % O2 Flow Rate (L/min): 3 L/min  Filed Weights   06/23/24 0937  Weight: 54 kg     Intake/Output Summary (Last 24 hours) at 06/24/2024 0829 Last data filed at 06/24/2024 0025 Gross per 24 hour  Intake 306.67 ml  Output --  Net 306.67 ml   Net IO Since Admission: 306.67 mL [06/24/24 0829]  Physical Exam: Physical Exam HENT:     Head: Normocephalic.  Cardiovascular:     Rate and Rhythm: Normal rate and regular rhythm.     Comments: 2+ DP pulse in right LE Pulmonary:     Breath sounds: Wheezing present.     Comments: Wheezes noted in anterior and posterior lung fields Musculoskeletal:     Right lower leg: No edema.     Left lower leg: No edema.     Comments: BL LE are cold to touch  Skin:    Comments: pale; left foot covered in dressing. petechiae noted in RUE. Severe bruising in LUE.     Neurological:     Mental Status: She is alert.  Psychiatric:        Mood and Affect: Mood normal.     Patient Lines/Drains/Airways Status     Active Line/Drains/Airways     Name Placement date Placement time  Site Days   Peripheral IV 06/23/24 20 G 1 Right Antecubital 06/23/24  0950  Antecubital  1   Peripheral IV 06/23/24 22 G Posterior;Right Wrist 06/23/24  1100  Wrist  1   Peripheral IV 06/23/24 22 G 1 Right;Anterior Forearm 06/23/24  1451  Forearm  1   Wound 06/23/24 1000 Other (Comment) Foot Anterior;Left 06/23/24  1000  Foot  1   Wound 06/23/24 1000 Other (Comment) Ankle Anterior;Left 06/23/24  1000  Ankle  1            Pertinent labs and imaging:      Latest Ref Rng & Units 06/24/2024    2:29 AM 06/23/2024   10:07 AM 06/23/2024   10:02 AM  CBC  WBC 4.0 - 10.5 K/uL 22.0   25.4   Hemoglobin 12.0 - 15.0 g/dL 8.6  6.1  5.1   Hematocrit 36.0 - 46.0 % 25.8  18.0  16.4   Platelets 150 - 400 K/uL 235   320        Latest Ref Rng & Units 06/24/2024    2:29 AM 06/23/2024   10:07 AM 06/23/2024   10:02 AM  CMP  Glucose 70 -  99 mg/dL 876  780  779   BUN 8 - 23 mg/dL 26  25  25    Creatinine 0.44 - 1.00 mg/dL 8.16  7.59  7.69   Sodium 135 - 145 mmol/L 128  130  131   Potassium 3.5 - 5.1 mmol/L 3.5  4.5  4.6   Chloride 98 - 111 mmol/L 89  90  89   CO2 22 - 32 mmol/L 28   23   Calcium  8.9 - 10.3 mg/dL 8.0   8.2   Total Protein 6.5 - 8.1 g/dL   5.1   Total Bilirubin 0.0 - 1.2 mg/dL   0.7   Alkaline Phos 38 - 126 U/L   58   AST 15 - 41 U/L   55   ALT 0 - 44 U/L   35     DG Chest Portable 1 View Result Date: 06/23/2024 CLINICAL DATA:  Cough EXAM: PORTABLE CHEST 1 VIEW COMPARISON:  June 23, 2023 FINDINGS: No focal consolidation.  No pleural effusions.  No pneumothorax. Unchanged cardiomediastinal silhouette. Sequelae of prior CABG. Intact sternotomy wires. Partially imaged left shoulder arthroplasty hardware. IMPRESSION: No active disease. Electronically Signed   By: Michaeline Blanch M.D.   On: 06/23/2024 11:38   DG Ankle Left Port Result Date: 06/23/2024 CLINICAL DATA:  Wound infection EXAM: PORTABLE LEFT ANKLE - 2 VIEW COMPARISON:  None Available. FINDINGS: There is no evidence of fracture,  dislocation, or joint effusion. There is no evidence of arthropathy or other focal bone abnormality. Mild soft tissue swelling. IMPRESSION: No acute osseous findings. Electronically Signed   By: Michaeline Blanch M.D.   On: 06/23/2024 11:37   DG Foot Complete Left Result Date: 06/23/2024 CLINICAL DATA:  Wound infection EXAM: LEFT FOOT - COMPLETE 3+ VIEW COMPARISON:  Same day ankle radiographs FINDINGS: There is no evidence of fracture or dislocation. There is no evidence of arthropathy or other focal bone abnormality. Mild soft tissue swelling. IMPRESSION: No acute osseous findings. Electronically Signed   By: Michaeline Blanch M.D.   On: 06/23/2024 11:36    ASSESSMENT/PLAN:  Assessment: Principal Problem:   Blood loss   Plan: [name] is a 68 y.o. person living with a history of COPD, asthma, STEMI, hypertension, hyperlipidemia, claudication peripheral vascular disease, CABG in 2017 who presented with fall and admitted for fall.   Fall Pt's hgb improved after transfusion and she said she was feeling much better. Her fall could have been secondary to profuse blood loss from her left foot wounds versus dehydration from less p.o. intake these past few days. She appears week and dehydrated with positive skin turgor on PE. Will start her on IV fluids. Encourage good PO intake  --LR fluids started --received 2 units blood on admission --Recheck CBC AM   Left foot wounds Cellulitis Wounds on left woot likely 2/2 to her PAD and smoking history. Will get ABIs. Can consider vascular surgery consult for wounds. Concerned for cellulitis/osteomyelitis as she had some swelling in her toes with elevated WBC count. A1c was lower than previous levels at 6.0.    -Linezolid  600 mg IV: Day 2 -MRI of left ankle and foot, pending results  -ABIs  -potential Vascular surgery consult -continue Plavix  -continue Protonix  for GI prophylaxis   AKI Cr improving at 1.83. Likely 2/2 to dehydration vs hypotension.  Started IV fluids. Will monitor levels with good PO intake and with IV fluids. --IV fluids  Microcytic Anemia While blood loss from wounds can cause anemia,  suspect other causes for her low levels. Pt hasn't had a colonoscopy in the last 10 years. No changes or blood noted in stools. Said she had polyps in last colonoscopy. Asked pt to f/u with GI post discharge.   COPD Asthma Pt had wheezes on her PE and complained of SOB. Said breztrii hasn't helped much. Will start with triple therapy--will not give her the steroid part due to allergy. Stoped albuterol  as she has worsening of SOB with it. Respiratory panel ordered.  --triple therapy without the steroid due to allergy --breztrii daily --stop albuterol    Hx Paroxysmal Atrial Fibrillation On eliquis . Held now for her bleeding.   -eliquis  on hold -holding metoprolol     HLD: --continue atorvastatin  and ezetimibe     DM Will hold farxiga  due to AKI. A1c gone down to 6.0 from 6.7 two months ago.    HTN BP currently soft. On losartan  and diltiazem . Will hold  now till BP normalizes.   Best Practice: Diet: Normal VTE: None IVF: LR Code: Full   Disposition planning: Prior to Admission Living Arrangement: home Anticipated Discharge Location: Home   Dispo: Admit patient to Inpatient with expected length of stay greater than 2 midnights.    Signature:  Rebecka Edgardo [facility]  8:29 AM, 06/24/2024  On Call pager [phone]

## 2024-06-24 NOTE — Progress Notes (Signed)
 VASCULAR LAB    ABI has been performed.  See CV proc for preliminary results.   Tomiko Schoon, RVT 06/24/2024, 11:35 AM

## 2024-06-24 NOTE — Plan of Care (Signed)

## 2024-06-24 NOTE — Consult Note (Signed)
 WOC Nurse Consult Note: patient with history of PVD; seen last by Dr. Ladona 03/2024 for same  Reason for Consult: L foot and ankle wounds  Wound type: full thickness wounds  L lower leg, foot and ankle per patient from falls (was seen in ER 04/27/2024 and had L ankle laceration sutured) in setting of PVD  Pressure Injury POA: NA not pressure  Measurement: see nursing flowsheet  Wound bed: largely clean with 75% red 25% tan  Drainage (amount, consistency, odor) no purulent drainage per MD note  Periwound: intact  Dressing procedure/placement/frequency: Cleanse L foot and ankle/leg wounds with Vashe wound cleanser Soila [phone]) do not rinse and allow to air dry. Apply Vashe moistened gauze to wound beds daily, cover with dry gauze and secure with Kerlix roll gauze beginning right above toes and ending above wounds.  May apply Ace bandage wrapped in same fashion as Kerlix for light compression.   Patient is pending MRI of ankle to rule out osteomyelitis. Will likely require consult to orthopedics/vascular for ongoing management of these wounds.     POC discussed with bedside nurse. WOC team will not follow.    Thank you,    Powell Bar MSN, RN-BC, [employer]

## 2024-06-25 DIAGNOSIS — L97522 Non-pressure chronic ulcer of other part of left foot with fat layer exposed: Secondary | ICD-10-CM

## 2024-06-25 DIAGNOSIS — L97329 Non-pressure chronic ulcer of left ankle with unspecified severity: Secondary | ICD-10-CM | POA: Diagnosis not present

## 2024-06-25 DIAGNOSIS — I739 Peripheral vascular disease, unspecified: Secondary | ICD-10-CM

## 2024-06-25 DIAGNOSIS — E11621 Type 2 diabetes mellitus with foot ulcer: Secondary | ICD-10-CM | POA: Diagnosis not present

## 2024-06-25 DIAGNOSIS — I2510 Atherosclerotic heart disease of native coronary artery without angina pectoris: Secondary | ICD-10-CM

## 2024-06-25 DIAGNOSIS — Z720 Tobacco use: Secondary | ICD-10-CM

## 2024-06-25 DIAGNOSIS — L97529 Non-pressure chronic ulcer of other part of left foot with unspecified severity: Secondary | ICD-10-CM | POA: Diagnosis not present

## 2024-06-25 DIAGNOSIS — Z9181 History of falling: Secondary | ICD-10-CM | POA: Diagnosis not present

## 2024-06-25 DIAGNOSIS — E785 Hyperlipidemia, unspecified: Secondary | ICD-10-CM

## 2024-06-25 LAB — BASIC METABOLIC PANEL WITH GFR
Anion gap: 10 (ref 5–15)
BUN: 13 mg/dL (ref 8–23)
CO2: 29 mmol/L (ref 22–32)
Calcium: 8.1 mg/dL — ABNORMAL LOW (ref 8.9–10.3)
Chloride: 92 mmol/L — ABNORMAL LOW (ref 98–111)
Creatinine, Ser: 1.25 mg/dL — ABNORMAL HIGH (ref 0.44–1.00)
GFR, Estimated: 48 mL/min — ABNORMAL LOW (ref >60)
Glucose, Bld: 111 mg/dL — ABNORMAL HIGH (ref 70–99)
Potassium: 3.6 mmol/L (ref 3.5–5.1)
Sodium: 131 mmol/L — ABNORMAL LOW (ref 135–145)

## 2024-06-25 LAB — CBC WITH DIFFERENTIAL/PLATELET
Abs Immature Granulocytes: 0.15 K/uL — ABNORMAL HIGH (ref 0.00–0.07)
Basophils Absolute: 0 K/uL (ref 0.0–0.1)
Basophils Relative: 0 %
Eosinophils Absolute: 0 K/uL (ref 0.0–0.5)
Eosinophils Relative: 0 %
HCT: 27.6 % — ABNORMAL LOW (ref 36.0–46.0)
Hemoglobin: 8.7 g/dL — ABNORMAL LOW (ref 12.0–15.0)
Immature Granulocytes: 1 %
Lymphocytes Relative: 13 %
Lymphs Abs: 2.1 K/uL (ref 0.7–4.0)
MCH: 24.8 pg — ABNORMAL LOW (ref 26.0–34.0)
MCHC: 31.5 g/dL (ref 30.0–36.0)
MCV: 78.6 fL — ABNORMAL LOW (ref 80.0–100.0)
Monocytes Absolute: 1.7 K/uL — ABNORMAL HIGH (ref 0.1–1.0)
Monocytes Relative: 11 %
Neutro Abs: 11.9 K/uL — ABNORMAL HIGH (ref 1.7–7.7)
Neutrophils Relative %: 75 %
Platelets: 226 K/uL (ref 150–400)
RBC: 3.51 MIL/uL — ABNORMAL LOW (ref 3.87–5.11)
RDW: 19.3 % — ABNORMAL HIGH (ref 11.5–15.5)
WBC: 16 K/uL — ABNORMAL HIGH (ref 4.0–10.5)
nRBC: 0.2 % (ref 0.0–0.2)

## 2024-06-25 LAB — IRON AND TIBC
Iron: 11 ug/dL — ABNORMAL LOW (ref 28–170)
Saturation Ratios: 4 % — ABNORMAL LOW (ref 10.4–31.8)
TIBC: 300 ug/dL (ref 250–450)
UIBC: 289 ug/dL

## 2024-06-25 LAB — FOLATE: Folate: 5.9 ng/mL — ABNORMAL LOW (ref >5.9)

## 2024-06-25 LAB — FERRITIN: Ferritin: 63 ng/mL (ref 11–307)

## 2024-06-25 LAB — VITAMIN B12: Vitamin B-12: 1350 pg/mL — ABNORMAL HIGH (ref 180–914)

## 2024-06-25 MED ORDER — ONDANSETRON HCL 4 MG/2ML IJ SOLN
4.0000 mg | Freq: Three times a day (TID) | INTRAMUSCULAR | Status: DC | PRN
  Administered 2024-06-25 – 2024-06-26 (×3): 4 mg via INTRAVENOUS
  Filled 2024-06-25 (×3): qty 2

## 2024-06-25 MED ORDER — LINEZOLID 600 MG PO TABS
600.0000 mg | ORAL_TABLET | Freq: Two times a day (BID) | ORAL | Status: DC
  Administered 2024-06-25 – 2024-06-26 (×2): 600 mg via ORAL
  Filled 2024-06-25 (×2): qty 1

## 2024-06-25 NOTE — Consult Note (Signed)
 Cardiology Consultation   Patient ID: [name] MRN: [id number]; DOB: Oct 03, 1956  Admit date: 06/23/2024 Date of Consult: 06/25/2024  PCP:  Benjamine Lauraine DASEN, NP   [facility] Providers Cardiologist:  Gordy Bergamo, MD    Patient Profile: [name] is a 68 y.o. 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 Rosan.  History of Present Illness: [name] is a 68 year old female with above medical history who is followed by Dr. Bergamo  Patient previously had CABG x4 in 2017 with LIMA-LAD, SVG-OM1 and OM2, SVG-RCA. Later had an inferolateral STEMI in 09/2021 that was complicated by afib with RVR. Cardiac catheterization 09/23/21 showed patent LIMA-LAD, occluded SVG-OM1-2 skip graft, occluded SVG-RCA. Patient underwent successful PCI to ulcerated high grade native RCA with implantation of DES in the prox-mid RCA. Echocardiogram at that time showed EF 45-50% with wall motion abnormalities, grade III DD, normal RV systolic function, moderately elevated PA systolic pressure, moderate-severe mitral regurgitation.   Repeat echocardiogram in 11/2021 showed EF 61%, grade I DD. Carotid dopplers in 05/2022 showed minimal stenosis in the right ICA, 1-15% stenosis in the left ICA.   Abdominal aortogram with lower extremities 01/2023 showed moderate diffuse calcification in the abdominal aorta, subtotally occluded right SFA, subtotally occluded prix-mid left SFA, CTO of the mid-distal left SFA. Patient was treated with successful left SFA orbital atherectomy and drug-coated balloon angioplasty.    More recently, R lower extremity angiography in 01/2024 showed diffusely disease right SFA. Treated with successful CSI orbital atherectomy. ABIs in 02/2024  were within the normal range on the right and  indicated moderate left lower extremity disease on the left.   Patient last seen by Dr. Bergamo on 03/26/24. At that time, patient reported leg swelling and dizziness when bending at the waist. She was compliant with her eliquis , but had not restarted on plavix  after her vascular intervention in 01/2024. Plavix  was restarted and she was encouraged to ambulate as much as possible. She was maintaining NSR at that time   Patient presented to the ED on 9/6 complaining of a fall. She had fallen 3 weeks prior and had fallen on her left ankle. Received sutures for her wound then. 3 days ago, she had another fall and injured the same ankle. She believes she tripped on the run and fell. Was concerned about how much bleeding she had from her left ankle.   In the ED, patient was found to be anemic with hemoglobin 5.1. She also admitted to not eating or drinking very much over the past few days, and it was suspected that this contributed to her fall. She was admitted in to the internal medicine teaching service. Eliquis  was held due to bleeding and anemia. She was hypotensive and her home BP medications were held. Given IV fluids and blood transfusion. There was concern for osteomyelitis of the left foot and patient was started on antibiotics. MRI of left food was negative for osteomyelitis but did show a possibility of osteonecrosis and small fluid collection which could be abscess. With recent nonhealing wounds, Dr. Bergamo was contacted for PVD.   On interview, patient reports that she fell about 3 weeks ago and cut her left ankle. She got stitches and both her and her husband reported that the wound had been healing well.  She was able to get her stitches removed. Then about 3 days ago she fell again and re-injured the same area. She had significant bleeding and had a difficult time controlling it. She also reported feeling poorly, with nausea, dry heaving, fatigue. Since coming to the hospital, she has had significant  improvement in her overall health since getting the blood transfusion and fluids.   Past Medical History:  Diagnosis Date   Acute diverticulitis 03/30/2020   Acute ST elevation myocardial infarction (STEMI) involving other coronary artery of inferior wall (HCC) 09/23/2021   Acute ST elevation myocardial infarction (STEMI) of inferior wall (HCC) 09/22/2021   Asthma    Benign essential HTN    Claudication in peripheral vascular disease (HCC)    Coronary artery disease    Hyperlipidemia    IBS (irritable bowel syndrome)    Migraine headache     Past Surgical History:  Procedure Laterality Date   ABDOMINAL AORTOGRAM W/LOWER EXTREMITY N/A 01/28/2023   Procedure: ABDOMINAL AORTOGRAM W/LOWER EXTREMITY;  Surgeon: Ladona Heinz, MD;  Location: [facility] IN[facility]ASIVE [facility];  Service: Cardiovascular;  Laterality: N/A;   CORONARY/GRAFT ACUTE MI REVASCULARIZATION N/A 09/22/2021   Procedure: Coronary/Graft Acute MI Revascularization;  Surgeon: Ladona Heinz, MD;  Location: [facility];  Service: Cardiovascular;  Laterality: N/A;   LEFT HEART CATH AND CORONARY ANGIOGRAPHY N/A 09/22/2021   Procedure: LEFT HEART CATH AND CORONARY ANGIOGRAPHY;  Surgeon: Ladona Heinz, MD;  Location: [facility];  Service: Cardiovascular;  Laterality: N/A;   LOWER EXTREMITY ANGIOGRAPHY  02/02/2024   Procedure: Lower Extremity Angiography;  Surgeon: Ladona Heinz, MD;  Location: [facility] INVASIVE CV LAB;  Service: Cardiovascular;;   LOWER EXTREMITY INTERVENTION  02/02/2024   Procedure: LOWER EXTREMITY INTERVENTION;  Surgeon: Ladona Heinz, MD;  Location: [facility];  Service: Cardiovascular;;  RT SFA atherectomy and DCB   PERIPHERAL VASCULAR ATHERECTOMY  01/28/2023   Procedure: PERIPHERAL VASCULAR ATHERECTOMY;  Surgeon: Ladona Heinz, MD;  Location: [facility] INVASIVE CV LAB;  Service: Cardiovascular;;   PERIPHERAL VASCULAR BALLOON ANGIOPLASTY  01/28/2023   Procedure: PERIPHERAL VASCULAR BALLOON ANGIOPLASTY;  Surgeon: Ladona Heinz, MD;  Location: [facility]  INVASIVE CV LAB;  Service: Cardiovascular;;   REVERSE SHOULDER ARTHROPLASTY Left 09/16/2021   Procedure: REVERSE SHOULDER ARTHROPLASTY;  Surgeon: Kay Kemps, MD;  Location: [facility];  Service: Orthopedics;  Laterality: Left;     Scheduled Meds:  arformoterol   15 mcg Nebulization BID   atorvastatin   80 mg Oral Daily   budesonide -glycopyrrolate -formoterol   2 puff Inhalation Daily   diltiazem   120 mg Oral Daily   ezetimibe   10 mg Oral Daily   linezolid   600 mg Oral Q12H   metoprolol  tartrate  50 mg Oral BID   pantoprazole   40 mg Oral QAC breakfast   polyethylene glycol  17 g Oral Daily   pregabalin   75 mg Oral TID   revefenacin   175 mcg Nebulization Daily   Continuous Infusions:  lactated ringers  Stopped (06/24/24 1013)   PRN Meds: acetaminophen , ondansetron  (ZOFRAN ) IV, oxyCODONE   Allergies:    Allergies  Allergen Reactions   Cefepime  Anaphylaxis, Shortness Of Breath, Swelling and Dermatitis   Edoxaban Other (See Comments)    Causes hematomas   Penicillins Anaphylaxis   Barium Iodid Rash   Other     Preservatives  (C-Diff)    Acetaminophen      Will not take after Cyanide deaths, something changed when they mkt'd again.  Can take Childrens Liquid but sometimes upset stomach too   Albuterol   Hypertension    ABLE TO TOLERATE IN INHALER FORM NOT NEBULIZER    Bactrim [Sulfamethoxazole-Trimethoprim]     REACTION: antibiotic induced colitis   Beta Adrenergic Blockers     Paradoxical reactions   Prednisone       ibs problems   Propofol      REACTION: FEVER   Repatha [Evolocumab] Other (See Comments)    Knocked her out for 10 days    Social History:   Social History   Socioeconomic History   Marital status: Married    Spouse name: Not on file   Number of children: Not on file   Years of education: Not on file   Highest education level: Not on file  Occupational History   Not on file  Tobacco Use   Smoking status: Some Days    Current packs/day: 1.00    Types:  Cigarettes   Smokeless tobacco: Never   Tobacco comments:    >5 cigarettes per day  Vaping Use   Vaping status: Never Used  Substance and Sexual Activity   Alcohol use: Yes    Alcohol/week: 0.0 standard drinks of alcohol    Comment: Occasional to Rare   Drug use: No   Sexual activity: Not on file  Other Topics Concern   Not on file  Social History Narrative   Not on file   Social Drivers of Health   Financial Resource Strain: Not on file  Food Insecurity: No Food Insecurity (06/23/2024)   Hunger Vital Sign    Worried About Running Out of Food in the Last Year: Never true    Ran Out of Food in the Last Year: Never true  Transportation Needs: No Transportation Needs (06/23/2024)   PRAPARE - Administrator, Civil Service (Medical): No    Lack of Transportation (Non-Medical): No  Physical Activity: Not on file  Stress: Not on file  Social Connections: Moderately Isolated (06/24/2024)   Social Connection and Isolation Panel    Frequency of Communication with Friends and Family: More than three times a week    Frequency of Social Gatherings with Friends and Family: Twice a week    Attends Religious Services: Never    Database administrator or Organizations: No    Attends Banker Meetings: Never    Marital Status: Married  Catering manager Violence: Not At Risk (06/23/2024)   Humiliation, Afraid, Rape, and Kick questionnaire    Fear of Current or Ex-Partner: No    Emotionally Abused: No    Physically Abused: No    Sexually Abused: No    Family History:    Family History  Problem Relation Age of Onset   Hypertension Mother    Cancer - Other Father        Oropharyngeal SCCa   CAD Brother        Deceased on MI age 57   Seizures Maternal Uncle    Alzheimer's disease Paternal Aunt    Alzheimer's disease Paternal Uncle      ROS:  Please see the history of present illness.  All other ROS reviewed and negative.     Physical Exam/Data: Vitals:    06/24/24 2041 06/25/24 0300 06/25/24 0819 06/25/24 0855  BP: (!) 130/55 (!) 117/50  (!) 116/55  Pulse: 73 (!) 58 60 63  Resp: 18 17 18 17   Temp: 98.8 F (37.1 C) 98.3 F (36.8 C)  97.7 F (36.5 C)  TempSrc: Oral Oral    SpO2: 98% 99%  91%  Weight:      Height:        Intake/Output Summary (Last 24 hours) at 06/25/2024 1257 Last data filed at 06/24/2024 1700 Gross per 24 hour  Intake 340 ml  Output --  Net 340 ml      06/23/2024    9:37 AM 03/26/2024   11:06 AM 03/01/2024    1:35 PM  Last 3 Weights  Weight (lbs) 119 lb 125 lb 126 lb  Weight (kg) 53.978 kg 56.7 kg 57.153 kg     Body mass index is 21.08 kg/m.  General:  Well nourished, well developed, in no acute distress. Sitting comfortably in the armchair  HEENT: normal Neck: no JVD Vascular: Radial pulses 2+ bilaterally Cardiac:  normal S1, S2; RRR; no murmur   Lungs:  expiratory wheezing throughout, normal WOB on [place]   Abd: soft, nontender  Ext: no edema in BLE  Musculoskeletal:  LLE wrapped in bandages  Skin: warm and dry  Neuro:  CNs 2-12 intact, no focal abnormalities noted Psych:  Normal affect   EKG:  The EKG was personally reviewed and demonstrates:  EKG showed sinus rhythm with RAE, TWI in leads I, aVL  Telemetry:  Telemetry was personally reviewed and demonstrates:  NA  Relevant CV Studies: Cardiac Studies & Procedures   ______________________________________________________________________________________________ CARDIAC CATHETERIZATION  CARDIAC CATHETERIZATION 09/23/2021  Conclusion Left Heart Catheterization 09/23/21: RCA diffuse in the proximal and mid RCA with ulceration and scattered 80-99% stenosis. Successful PCI to ulcerated high grade native RCA with implantation of 3.0x48 mm Synergy SD in the proximal to mid RCA. Timi 3 to 3 flow. LM diffuse 80% stenosis. LAD patent with mild disease and LIMA to LAD patent. CX: Moderate OM1 widely patent. Cx severely diseased after origin of OM 1 with 90-95%  stenosis.  Small diseased OM-2 RI: Small with ostial mild disease. SVG to OM1-2 skip graft occluded, SVG to RCA occluded. LV: EF 35-40% with inferior akinesis. No MR. Hemodynamics: LV 112/18, EDP 25 mm Hg. Ao 114/80, mean 112 mm Hg. No pressure gradient across the AV. 90 mL contrast used.  Findings Coronary Findings Diagnostic  Dominance: Right  Left Main Ost LM to Mid LM lesion is 80% stenosed.  Left Circumflex Mid Cx to Dist Cx lesion is 99% stenosed.  First Obtuse Marginal Branch Vessel is small in size.  Second Obtuse Marginal Branch Vessel is small in size.  Right Coronary Artery Prox RCA to Mid RCA lesion is 90% stenosed. The lesion is type C, irregular and ulcerative.  LIMA Graft To Mid LAD  Graft To Prox RCA Origin to Prox Graft lesion is 100% stenosed.  Sequential Graft To 1st Mrg, 2nd Mrg Origin to Prox Graft lesion before 1st Mrg  is 100% stenosed.  Intervention  Prox RCA to Mid RCA lesion Stent Lesion length:  30 mm. Lesion crossed with guidewire using a WIRE RUNTHROUGH .[id number]. Pre-stent angioplasty was not performed. A drug-eluting stent was successfully placed using a SYNERGY XD 3.0X48. Maximum pressure: 18 atm. Stent strut is well apposed. Post-stent angioplasty was not performed. Post-Intervention Lesion Assessment The intervention was successful. Pre-interventional TIMI flow is 3. Post-intervention TIMI flow is 3. No complications occurred at this lesion. There is a 0% residual stenosis post intervention.     ECHOCARDIOGRAM  PCV ECHOCARDIOGRAM COMPLETE 11/23/2021  Narrative Echocardiogram 11/23/2021: Left ventricle cavity is normal in size and wall thickness. Abnormal septal wall motion due to post-operative coronary artery bypass graft. Normal LV systolic function with EF 61%.  Doppler evidence of grade I (impaired) diastolic dysfunction, normal LAP. No significant valvular abnormality. Normal right atrial pressure.           ______________________________________________________________________________________________       Laboratory Data: High Sensitivity Troponin:  No results for input(s): TROPONINIHS in the last 720 hours.   Chemistry Recent Labs  Lab 06/23/24 1002 06/23/24 1007 06/24/24 0229 06/25/24 0435  NA 131* 130* 128* 131*  K 4.6 4.5 3.5 3.6  CL 89* 90* 89* 92*  CO2 23  --  28 29  GLUCOSE 220* 219* 123* 111*  BUN 25* 25* 26* 13  CREATININE 2.30* 2.40* 1.83* 1.25*  CALCIUM  8.2*  --  8.0* 8.1*  GFRNONAA 23*  --  30* 48*  ANIONGAP 19*  --  11 10    Recent Labs  Lab 06/23/24 1002  PROT 5.1*  ALBUMIN 2.8*  AST 55*  ALT 35  ALKPHOS 58  BILITOT 0.7   Lipids No results for input(s): CHOL, TRIG, HDL, LABVLDL, LDLCALC, CHOLHDL in the last 168 hours.  Hematology Recent Labs  Lab 06/23/24 1002 06/23/24 1007 06/24/24 0229 06/25/24 0435  WBC 25.4*  --  22.0* 16.0*  RBC 1.99*  --  3.42* 3.51*  HGB 5.1* 6.1* 8.6* 8.7*  HCT 16.4* 18.0* 25.8* 27.6*  MCV 82.4  --  75.4* 78.6*  MCH 25.6*  --  25.1* 24.8*  MCHC 31.1  --  33.3 31.5  RDW 18.2*  --  19.2* 19.3*  PLT 320  --  235 226   Thyroid  No results for input(s): TSH, FREET4 in the last 168 hours.  BNP Recent Labs  Lab 06/23/24 1002  BNP 547.2*    DDimer No results for input(s): DDIMER in the last 168 hours.  Radiology/Studies:  VAS US  ABI WITH/WO TBI Result Date: 06/24/2024  LOWER EXTREMITY DOPPLER STUDY Patient Name:  JASMAIN AHLBERG  Date of Exam:   06/24/2024 Medical Rec #: [id number]          Accession #:    [id number] Date of Birth: 06-25-1956          Patient Gender: F Patient Age:   40 years Exam Location:  [facility] Procedure:      VAS US  ABI WITH/WO TBI Referring Phys: JULIE WILLIAMS --------------------------------------------------------------------------------  Indications: Left foot and ankle ulceration from fall in July 2025. Patient fell              again recently and reinjured foot.  Cellulitis. High Risk Factors: Hypertension, hyperlipidemia, current smoker, coronary artery                    disease (CABG 2017).. Other Factors: PAF, on Eliquis .  Vascular Interventions: S/P right SFA arthrectomy 02/02/24 by Dr. Ladona. S/P left                         SFA orbital atherectomy with 1.5 mm solid crown                         diamondback catheter followed by drug-coated balloon                         angioplasty 01/28/2023 by Dr. Ladona. Comparison Study: Prior study done 03/05/2024 Performing Technologist: Rachel Pellet RVS  Examination Guidelines: A complete evaluation includes at minimum, Doppler waveform signals and systolic blood pressure reading at the level of bilateral brachial,  anterior tibial, and posterior tibial arteries, when vessel segments are accessible. Bilateral testing is considered an integral part of a complete examination. Photoelectric Plethysmograph (PPG) waveforms and toe systolic pressure readings are included as required and additional duplex testing as needed. Limited examinations for reoccurring indications may be performed as noted.  ABI Findings: +---------+------------------+-----+---------+--------+ Right    Rt Pressure (mmHg)IndexWaveform Comment  +---------+------------------+-----+---------+--------+ Brachial 123                    triphasic         +---------+------------------+-----+---------+--------+ PTA      107               0.87 biphasic          +---------+------------------+-----+---------+--------+ DP       98                0.80 biphasic          +---------+------------------+-----+---------+--------+ Great Toe56                0.46 Abnormal          +---------+------------------+-----+---------+--------+ +---------+------------------+-----+---------+---------------------------------+ Left     Lt Pressure (mmHg)IndexWaveform Comment                            +---------+------------------+-----+---------+---------------------------------+ Brachial                        triphasicpatient asked not to have BP                                               taken secondary to wound on elbow +---------+------------------+-----+---------+---------------------------------+ PTA      79                0.64 biphasic                                   +---------+------------------+-----+---------+---------------------------------+ DP       76                0.62 biphasic                                   +---------+------------------+-----+---------+---------------------------------+ Great Toe18                0.15 Abnormal                                   +---------+------------------+-----+---------+---------------------------------+ +-------+-----------+-----------+------------+------------+ ABI/TBIToday's ABIToday's TBIPrevious ABIPrevious TBI +-------+-----------+-----------+------------+------------+ Right  0.87       0.46       0.98        0.41         +-------+-----------+-----------+------------+------------+ Left   0.64       0.15       0.76        0.51         +-------+-----------+-----------+------------+------------+ Bilateral ABIs appear decreased compared to prior study on 03/05/2024. Left TBIs appear decreased compared to prior study on 03/05/2024. Right TBIs appear essentially unchanged compared to prior study done 03/05/24.  Summary: Right: Resting right ankle-brachial  index indicates mild right lower extremity arterial disease. The right toe-brachial index is abnormal.  Left: Resting left ankle-brachial index indicates moderate left lower extremity arterial disease. The left toe-brachial index is abnormal.  *See table(s) above for measurements and observations.  Electronically signed by Gaile New MD on 06/24/2024 at 8:26:30 PM.    Final    MR FOOT LEFT WO CONTRAST Result Date: 06/24/2024 MR FOOT WITHOUT IV CONTRAST  COMPARISON: None. CLINICAL HISTORY: Wounds PULSE SEQUENCES: Ax T1, Ax T2 FS, Sag T1, Sag T2 FS, Cor STIR, Cor T1 without contrast. FINDINGS: Mild degenerative changes are seen in the forefoot. There is abnormal signal in the fifth metatarsal head which is nonspecific. This could be posttraumatic or related to necrosis. Cyst not have the classic appearance of osteomyelitis. There is a dorsal wound in the midfoot with a small fluid collection best seen on coronal image 3 and sagittal image 19. This measures approximately 4 x 16 mm in size. No large drainable collection is appreciated. There are other women's identified dorsally over the forefoot. Musculotendinous structures demonstrate no significant abnormality. IMPRESSION: Bones and the dorsal aspect of the foot with a small fluid collection dorsal to the third metatarsal base. This could reflect a small abscess in the appropriate clinical setting. Abnormal signal in the fifth metatarsal head and distal metatarsal which is nonspecific. This could be posttraumatic or related osteonecrosis. No obvious destructive changes are identified to suggest osteomyelitis. Follow-up MRI if symptoms progress. Electronically signed by: Norleen Satchel MD 06/24/2024 01:13 PM EDT RP Workstation: [id number]   MR ANKLE LEFT WO CONTRAST Result Date: 06/24/2024 MR ANKLE WITHOUT IV CONTRAST COMPARISON: None. CLINICAL HISTORY: Wounds PULSE SEQUENCES: Ax T1, Ax T2 FS, Sag T1, Sag T2 FS, Cor STIR, Ax T1 FS FINDINGS: Bones: There is no fracture or marrow replacing process. No accelerated arthrosis is present. No joint effusion is identified. There is a dorsal wound is identified at the level of the midfoot with dermal thickening and moderate subcutaneous edema. There is a small focal collection in the dorsum of the foot at the level of the proximal third metatarsal measuring approximately 3 x 17 mm in maximum size. This is best seen on coronal image 1 and sagittal image 9. There is a second  moderate-sized wound along the lateral ankle overlying the lateral malleolus with soft tissue gas. This is best seen on axial image 14 through 21. No large drainable collection is appreciated. Ligaments: The anterior and posterior tibiofibular and talofibular ligaments are intact. Deltoid ligament and spring ligaments are intact. The sinus tarsi is unremarkable. Musculotendinous structures: The tibialis anterior, extensor digitorum and extensor hallucis longus tendons are unremarkable. The posterior tibial tendon, flexor hallucis longus and flexor digitorum tendons are unremarkable. Likely mild reactive peroneal tenosynovitis is present without subluxation of the tendons. The Achilles tendon and plantar fascia are unremarkable. IMPRESSION: Moderate-sized wound in the lateral aspect of the ankle overlying the lateral malleolus and in the dorsum of the foot at the level of the base of the third metatarsal as above. Is a small dorsal fluid collection. No large drainable collection is appreciated. No evidence of osteomyelitis involving the ankle. Mild degenerative changes are present. Electronically signed by: Norleen Satchel MD 06/24/2024 01:09 PM EDT RP Workstation: [id number]   DG Chest Portable 1 View Result Date: 06/23/2024 CLINICAL DATA:  Cough EXAM: PORTABLE CHEST 1 VIEW COMPARISON:  June 23, 2023 FINDINGS: No focal consolidation.  No pleural effusions.  No pneumothorax. Unchanged cardiomediastinal silhouette. Sequelae of prior CABG.  Intact sternotomy wires. Partially imaged left shoulder arthroplasty hardware. IMPRESSION: No active disease. Electronically Signed   By: Michaeline Blanch M.D.   On: 06/23/2024 11:38   DG Ankle Left Port Result Date: 06/23/2024 CLINICAL DATA:  Wound infection EXAM: PORTABLE LEFT ANKLE - 2 VIEW COMPARISON:  None Available. FINDINGS: There is no evidence of fracture, dislocation, or joint effusion. There is no evidence of arthropathy or other focal bone abnormality. Mild soft tissue  swelling. IMPRESSION: No acute osseous findings. Electronically Signed   By: Michaeline Blanch M.D.   On: 06/23/2024 11:37   DG Foot Complete Left Result Date: 06/23/2024 CLINICAL DATA:  Wound infection EXAM: LEFT FOOT - COMPLETE 3+ VIEW COMPARISON:  Same day ankle radiographs FINDINGS: There is no evidence of fracture or dislocation. There is no evidence of arthropathy or other focal bone abnormality. Mild soft tissue swelling. IMPRESSION: No acute osseous findings. Electronically Signed   By: Michaeline Blanch M.D.   On: 06/23/2024 11:36     Assessment and Plan:  PAD  Left Food Wounds  - Patient has a history of PAD- previously had left SFA orbital atherectomy and balloon coated angioplasty in 01/2023. Later had CSI orbital atherectomy of the right SFA in 01/2024. ABIs in 02/2024 were within the normal range on the right and indicated moderate left lower extremity disease on the left  - 3 weeks ago, patient had a fall and tore her left ankle. She had stitches, which were later removed. Patient and her husband both report that area was healing well. Around 3 days ago, she fell again and broke open the same area. Had significant bleeding  - Hemoglobin was as low as 5.1 on arrival  - ABIs yesterday indicative for mild right lower extremity disease and moderate left lower extremity arterial disease  - Patient's story more consistent with mechanical trauma rather than nonhealing wound - Discussed with Dr. Ladona- patient does not have critical limb ischemia. She reports stable claudication. Plan for outpatient follow up. If wound continues to be slow to heal, may consider outpatient PV intervention once anemia resolves - Dr. Ladona will see patient tomorrow  - Continue lipitor  80 mg daily, zetia  10 mg daily. Plavix /eliquis  currently held with anemia   CAD  - Patient denies chest pain - Most recent cath in 09/2021-  patent LIMA-LAD, occluded SVG-OM1-2 skip graft, occluded SVG-RCA. Patient underwent successful PCI  to ulcerated high grade native RCA with implantation of DES in the prox-mid RCA  - Plavix /eliquis  held as above  - Continue lipitor , zetia , metoprolol    PAF  - Maintaining NSR on EKG and HR regular on exam  - Continue diltiazem  120 mg daily, metoprolol  tartrate 50 mg BID  - IF patient continues to have struggles with anemia, could consider outpatient watchman   Otherwise per primary  - Microcytic Anemia  - Falls - AKI - COPD, asthma - HLD  - Type 2 DM   Risk Assessment/Risk Scores:  For questions or updates, please contact [facility] Please consult [email or web address] for contact info under    Signed, Rollo FABIENE Louder, PA-C  06/25/2024 12:57 PM

## 2024-06-25 NOTE — Plan of Care (Signed)

## 2024-06-25 NOTE — Progress Notes (Signed)
 Mobility Specialist Progress Note:    06/25/24 1218  Mobility  Activity Pivoted/transferred from bed to chair  Level of Assistance Contact guard assist, steadying assist  Assistive Device Other (Comment) (HHA)  Distance Ambulated (ft) 8 ft  Activity Response Tolerated well  Mobility Referral Yes  Mobility visit 1 Mobility  Mobility Specialist Start Time (ACUTE ONLY) 1206  Mobility Specialist Stop Time (ACUTE ONLY) 1218  Mobility Specialist Time Calculation (min) (ACUTE ONLY) 12 min   Received pt in bed and agreeable to mobility. Pt c/o SOB; SPO2 88% on 2 L/min O2, recovered to 92%. Pt requested to ambulate to chair instead of the hallway. No physical assistance required. Left pt in chair. Personal belongings and call light within reach. All needs met.  Lavanda Pollack Mobility Specialist  Please contact via Secure Chat or  Rehab Office [phone]]

## 2024-06-25 NOTE — Plan of Care (Signed)
  Problem: Education: Goal: Knowledge of General Education information will improve Description: Including pain rating scale, medication(s)/side effects and non-pharmacologic comfort measures 06/25/2024 1610 by Mallory Breed, Celia Vernard HERO, RN Outcome: Progressing 06/25/2024 1610 by Mallory Breed, Celia Vernard HERO, RN Outcome: Progressing   Problem: Health Behavior/Discharge Planning: Goal: Ability to manage health-related needs will improve 06/25/2024 1610 by Mallory Breed, Celia Vernard HERO, RN Outcome: Progressing 06/25/2024 1610 by Mallory Breed, Celia Vernard HERO, RN Outcome: Progressing   Problem: Clinical Measurements: Goal: Ability to maintain clinical measurements within normal limits will improve 06/25/2024 1610 by Mallory Breed, Celia Vernard HERO, RN Outcome: Progressing 06/25/2024 1610 by Mallory Breed, Celia Vernard HERO, RN Outcome: Progressing Goal: Will remain free from infection 06/25/2024 1610 by Mallory Breed, Celia Vernard HERO, RN Outcome: Progressing 06/25/2024 1610 by Mallory Breed, Celia Vernard HERO, RN Outcome: Progressing Goal: Diagnostic test results will improve 06/25/2024 1610 by Mallory Breed, Celia Vernard HERO, RN Outcome: Progressing 06/25/2024 1610 by Mallory Breed, Celia Vernard HERO, RN Outcome: Progressing Goal: Respiratory complications will improve 06/25/2024 1610 by Mallory Breed, Celia Vernard HERO, RN Outcome: Progressing 06/25/2024 1610 by Mallory Breed, Celia Vernard HERO, RN Outcome: Progressing Goal: Cardiovascular complication will be avoided 06/25/2024 1610 by Mallory Breed, Celia Vernard HERO, RN Outcome: Progressing 06/25/2024 1610 by Mallory Breed, Celia Vernard HERO, RN Outcome: Progressing   Problem: Activity: Goal: Risk for activity intolerance will decrease 06/25/2024 1610 by Mallory Breed, [place], RN Outcome: Progressing 06/25/2024 1610 by Mallory Breed, Celia Vernard HERO, RN Outcome: Progressing   Problem: Nutrition: Goal: Adequate nutrition will be maintained 06/25/2024 1610 by Mallory Breed, Celia Vernard HERO, RN Outcome: Progressing 06/25/2024 1610 by Mallory Breed, Celia Vernard HERO,  RN Outcome: Progressing   Problem: Coping: Goal: Level of anxiety will decrease 06/25/2024 1610 by Mallory Breed, Celia Vernard HERO, RN Outcome: Progressing 06/25/2024 1610 by Mallory Breed, Celia Vernard HERO, RN Outcome: Progressing   Problem: Elimination: Goal: Will not experience complications related to bowel motility 06/25/2024 1610 by Mallory Breed, Celia Vernard HERO, RN Outcome: Progressing 06/25/2024 1610 by Mallory Breed, Celia Vernard HERO, RN Outcome: Progressing Goal: Will not experience complications related to urinary retention 06/25/2024 1610 by Mallory Breed, Celia Vernard HERO, RN Outcome: Progressing 06/25/2024 1610 by Mallory Breed, Celia Vernard HERO, RN Outcome: Progressing   Problem: Pain Managment: Goal: General experience of comfort will improve and/or be controlled 06/25/2024 1610 by Mallory Breed, Celia Vernard HERO, RN Outcome: Progressing 06/25/2024 1610 by Mallory Breed, Celia Vernard HERO, RN Outcome: Progressing   Problem: Safety: Goal: Ability to remain free from injury will improve 06/25/2024 1610 by Mallory Breed, Celia Vernard HERO, RN Outcome: Progressing 06/25/2024 1610 by Mallory Breed, Celia Vernard HERO, RN Outcome: Progressing   Problem: Skin Integrity: Goal: Risk for impaired skin integrity will decrease 06/25/2024 1610 by Mallory Breed, Celia Vernard HERO, RN Outcome: Progressing 06/25/2024 1610 by Mallory Breed, Celia Vernard HERO, RN Outcome: Progressing   Problem: Clinical Measurements: Goal: Ability to avoid or minimize complications of infection will improve 06/25/2024 1610 by Mallory Breed, Celia Vernard HERO, RN Outcome: Progressing 06/25/2024 1610 by Mallory Breed, Celia Vernard HERO, RN Outcome: Progressing   Problem: Skin Integrity: Goal: Skin integrity will improve 06/25/2024 1610 by Mallory Breed, Celia Vernard HERO, RN Outcome: Progressing 06/25/2024 1610 by Mallory Breed, Celia Vernard HERO, RN Outcome: Progressing

## 2024-06-25 NOTE — Progress Notes (Signed)
 HD#2 SUBJECTIVE:  Patient Summary: [name] is a 68 y.o. 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   Overnight Events: none  Interim History:  Saw pt at bedside. Said the breathing treatment yesterday did not help her much. Mentioned that her left foot was hurting which she thought was probably due to her foot being extended as it was wrapped up in dressing. Did mention that she wakes up with excessive sweating from her head in the middle of the night for the past 10 days, which she forgot to mention before. No other acute complaints.   OBJECTIVE:  Vital Signs: Vitals:   06/24/24 2041 06/25/24 0300 06/25/24 0819 06/25/24 0855  BP: (!) 130/55 (!) 117/50  (!) 116/55  Pulse: 73 (!) 58 60 63  Resp: 18 17 18 17   Temp: 98.8 F (37.1 C) 98.3 F (36.8 C)  97.7 F (36.5 C)  TempSrc: Oral Oral    SpO2: 98% 99%  91%  Weight:      Height:       Supplemental O2: Nasal Cannula SpO2: 91 % O2 Flow Rate (L/min): 2 L/min  Filed Weights   06/23/24 0937  Weight: 54 kg     Intake/Output Summary (Last 24 hours) at 06/25/2024 1013 Last data filed at 06/24/2024 1700 Gross per 24 hour  Intake 640.44 ml  Output --  Net 640.44 ml   Net IO Since Admission: 1,547.11 mL [06/25/24 1013]  Physical Exam: Physical Exam Skin:    General: Skin is warm.     Coloration: Skin is pale.     Comments: Wounds on left lateral ankle and dorsum of left foot. No significant bleeding. See image in pt's chart for more information.   Neurological:     Mental Status: She is alert.  Psychiatric:        Mood and Affect: Mood normal.     Patient Lines/Drains/Airways Status     Active Line/Drains/Airways     Name Placement date Placement time Site Days   Peripheral IV 06/24/24 22 G 1.75 Anterior;Right Forearm 06/24/24  1445  Forearm  1    Wound 06/23/24 1000 Other (Comment) Foot Anterior;Left 06/23/24  1000  Foot  2   Wound 06/23/24 1000 Other (Comment) Ankle Anterior;Left 06/23/24  1000  Ankle  2            Pertinent labs and imaging:      Latest Ref Rng & Units 06/25/2024    4:35 AM 06/24/2024    2:29 AM 06/23/2024   10:07 AM  CBC  WBC 4.0 - 10.5 K/uL 16.0  22.0    Hemoglobin 12.0 - 15.0 g/dL 8.7  8.6  6.1   Hematocrit 36.0 - 46.0 % 27.6  25.8  18.0   Platelets 150 - 400 K/uL 226  235         Latest Ref Rng & Units 06/25/2024    4:35 AM 06/24/2024    2:29 AM 06/23/2024   10:07 AM  CMP  Glucose 70 - 99 mg/dL 888  876  780   BUN 8 - 23 mg/dL 13  26  25    Creatinine 0.44 - 1.00 mg/dL 8.74  8.16  7.59   Sodium 135 - 145 mmol/L 131  128  130   Potassium 3.5 - 5.1 mmol/L 3.6  3.5  4.5   Chloride 98 - 111 mmol/L 92  89  90   CO2 22 - 32 mmol/L 29  28    Calcium  8.9 - 10.3 mg/dL 8.1  8.0      VAS US  ABI WITH/WO TBI Result Date: 06/24/2024  LOWER EXTREMITY DOPPLER STUDY Patient Name:  [name]  Date of Exam:   06/24/2024 Medical Rec #: [id number]          Accession #:    [id number] Date of Birth: 1955-10-27          Patient Gender: F Patient Age:   40 years Exam Location:  [facility] Procedure:      VAS US  ABI WITH/WO TBI Referring Phys: JULIE WILLIAMS --------------------------------------------------------------------------------  Indications: Left foot and ankle ulceration from fall in July 2025. Patient fell              again recently and reinjured foot. Cellulitis. High Risk Factors: Hypertension, hyperlipidemia, current smoker, coronary artery                    disease (CABG 2017).. Other Factors: PAF, on Eliquis .  Vascular Interventions: S/P right SFA arthrectomy 02/02/24 by Dr. Ladona. S/P left                         SFA orbital atherectomy with 1.5 mm solid crown                         diamondback catheter followed by drug-coated balloon                         angioplasty 01/28/2023 by Dr. Ladona.  Comparison Study: Prior study done 03/05/2024 Performing Technologist: Rachel Pellet RVS  Examination Guidelines: A complete evaluation includes at minimum, Doppler waveform signals and systolic blood pressure reading at the level of bilateral brachial, anterior tibial, and posterior tibial arteries, when vessel segments are accessible. Bilateral testing is considered an integral part of a complete examination. Photoelectric Plethysmograph (PPG) waveforms and toe systolic pressure readings are included as required and additional duplex testing as needed. Limited examinations for reoccurring indications may be performed as noted.  ABI Findings: +---------+------------------+-----+---------+--------+ Right    Rt Pressure (mmHg)IndexWaveform Comment  +---------+------------------+-----+---------+--------+ Brachial 123                    triphasic         +---------+------------------+-----+---------+--------+ PTA      107               0.87 biphasic          +---------+------------------+-----+---------+--------+ DP       98                0.80 biphasic          +---------+------------------+-----+---------+--------+ Great Toe56                0.46 Abnormal          +---------+------------------+-----+---------+--------+ +---------+------------------+-----+---------+---------------------------------+ Left     Lt Pressure (mmHg)IndexWaveform Comment                           +---------+------------------+-----+---------+---------------------------------+ Brachial                        triphasicpatient asked not to have BP  taken secondary to wound on elbow +---------+------------------+-----+---------+---------------------------------+ PTA      79                0.64 biphasic                                   +---------+------------------+-----+---------+---------------------------------+ DP       76                 0.62 biphasic                                   +---------+------------------+-----+---------+---------------------------------+ Great Toe18                0.15 Abnormal                                   +---------+------------------+-----+---------+---------------------------------+ +-------+-----------+-----------+------------+------------+ ABI/TBIToday's ABIToday's TBIPrevious ABIPrevious TBI +-------+-----------+-----------+------------+------------+ Right  0.87       0.46       0.98        0.41         +-------+-----------+-----------+------------+------------+ Left   0.64       0.15       0.76        0.51         +-------+-----------+-----------+------------+------------+ Bilateral ABIs appear decreased compared to prior study on 03/05/2024. Left TBIs appear decreased compared to prior study on 03/05/2024. Right TBIs appear essentially unchanged compared to prior study done 03/05/24.  Summary: Right: Resting right ankle-brachial index indicates mild right lower extremity arterial disease. The right toe-brachial index is abnormal.  Left: Resting left ankle-brachial index indicates moderate left lower extremity arterial disease. The left toe-brachial index is abnormal.  *See table(s) above for measurements and observations.  Electronically signed by Gaile New MD on 06/24/2024 at 8:26:30 PM.    Final    MR FOOT LEFT WO CONTRAST Result Date: 06/24/2024 MR FOOT WITHOUT IV CONTRAST COMPARISON: None. CLINICAL HISTORY: Wounds PULSE SEQUENCES: Ax T1, Ax T2 FS, Sag T1, Sag T2 FS, Cor STIR, Cor T1 without contrast. FINDINGS: Mild degenerative changes are seen in the forefoot. There is abnormal signal in the fifth metatarsal head which is nonspecific. This could be posttraumatic or related to necrosis. Cyst not have the classic appearance of osteomyelitis. There is a dorsal wound in the midfoot with a small fluid collection best seen on coronal image 3 and sagittal image 19. This measures  approximately 4 x 16 mm in size. No large drainable collection is appreciated. There are other women's identified dorsally over the forefoot. Musculotendinous structures demonstrate no significant abnormality. IMPRESSION: Bones and the dorsal aspect of the foot with a small fluid collection dorsal to the third metatarsal base. This could reflect a small abscess in the appropriate clinical setting. Abnormal signal in the fifth metatarsal head and distal metatarsal which is nonspecific. This could be posttraumatic or related osteonecrosis. No obvious destructive changes are identified to suggest osteomyelitis. Follow-up MRI if symptoms progress. Electronically signed by: Norleen Satchel MD 06/24/2024 01:13 PM EDT RP Workstation: [id number]   MR ANKLE LEFT WO CONTRAST Result Date: 06/24/2024 MR ANKLE WITHOUT IV CONTRAST COMPARISON: None. CLINICAL HISTORY: Wounds PULSE SEQUENCES: Ax T1, Ax T2 FS, Sag T1, Sag T2 FS, Cor STIR, Ax T1 FS FINDINGS: Bones: There is  no fracture or marrow replacing process. No accelerated arthrosis is present. No joint effusion is identified. There is a dorsal wound is identified at the level of the midfoot with dermal thickening and moderate subcutaneous edema. There is a small focal collection in the dorsum of the foot at the level of the proximal third metatarsal measuring approximately 3 x 17 mm in maximum size. This is best seen on coronal image 1 and sagittal image 9. There is a second moderate-sized wound along the lateral ankle overlying the lateral malleolus with soft tissue gas. This is best seen on axial image 14 through 21. No large drainable collection is appreciated. Ligaments: The anterior and posterior tibiofibular and talofibular ligaments are intact. Deltoid ligament and spring ligaments are intact. The sinus tarsi is unremarkable. Musculotendinous structures: The tibialis anterior, extensor digitorum and extensor hallucis longus tendons are unremarkable. The posterior tibial  tendon, flexor hallucis longus and flexor digitorum tendons are unremarkable. Likely mild reactive peroneal tenosynovitis is present without subluxation of the tendons. The Achilles tendon and plantar fascia are unremarkable. IMPRESSION: Moderate-sized wound in the lateral aspect of the ankle overlying the lateral malleolus and in the dorsum of the foot at the level of the base of the third metatarsal as above. Is a small dorsal fluid collection. No large drainable collection is appreciated. No evidence of osteomyelitis involving the ankle. Mild degenerative changes are present. Electronically signed by: Norleen Satchel MD 06/24/2024 01:09 PM EDT RP Workstation: [id number]    ASSESSMENT/PLAN:  Assessment: Principal Problem:   Blood loss   Plan: [name] is a 68 y.o. person living with a history of COPD, asthma, STEMI, hypertension, hyperlipidemia, claudication peripheral vascular disease, CABG in 2017 who presented with fall and admitted for fall.   Left foot wounds PAD Wounds on left woot likely 2/2 to her PAD and smoking history. ABIs positive for BL LE arterial disease. Right and left TBIs decreased from previous exam in 02/2024. Vascular surgery was consulted but asked us  to reach out to pt's cardiologist Dr. Ladona first. Reached out to Dr. Bary wait for his recommendations. MRI of left foot was negative for osteomyelitis. MRI showed left foot having a possibility of osteonecrosis and small fluid collection which could be an abscess. Will continue her linezolid  and change it to PO. Pt mentioned losing a few pounds these past few months but no significant weight loss. Her recent night sweats are likely due to her possible infection in left toe. Will monitor symptoms.    -Linezolid  600 mg IV changed to PO: Day 3 -Dr. Ladona consulted for her PAD -Vascular surgery consulted -Plavix  has been stopped throughout her admission -continue Protonix  for GI prophylaxis  Microcytic  Anemia Hgb has been stable around 8.6-8.7 since yesterday. Will check iron panel, vitamin b12, folate for further work-up of anemia.  --Pending iron panel results  --pending folate, b12 results   Fall Pt's hgb has been stable for the past two days around 8.6-8.7. She was feeling better. Received a bolus of IV fluids yesterday. Encourage good fluid and PO intake.    --Good PO intake  --received 2 units blood on admission --Recheck CBC AM   AKI Cr improving at 1.25. Likely 2/2 to dehydration. Will monitor levels with good PO intake and with IV fluids.  Chronic Conditions:   COPD Asthma Pt's respiratory panel was negative. She did not have any SOB today. However, mentioned that triple therapy did not help her much. Can consider increasing her breztrii use to  two times per day along with triple therapy.    --triple therapy without the steroid due to allergy --breztrii daily currently, can change to x2 daily if SOB worsens  --stopped albuterol    Hx Paroxysmal Atrial Fibrillation On eliquis . Held now for her bleeding.   -eliquis  on hold -holding metoprolol     HLD: --continue atorvastatin  and ezetimibe     DM Will hold farxiga  due to AKI. A1c gone down to 6.0 from 6.7 two months ago.    HTN BP currently soft. On losartan  and diltiazem . Will hold now till BP normalizes.   Best Practice: Diet: Normal VTE: None IVF: None,None Code: Full   Disposition planning: Prior to Admission Living Arrangement: home Anticipated Discharge Location: Home   Dispo: Admit patient to Inpatient with expected length of stay greater than 2 midnights.  Signature:  Rebecka Edgardo [facility]  10:13 AM, 06/25/2024  On Call pager [phone]

## 2024-06-26 DIAGNOSIS — L97529 Non-pressure chronic ulcer of other part of left foot with unspecified severity: Secondary | ICD-10-CM

## 2024-06-26 DIAGNOSIS — Z9181 History of falling: Secondary | ICD-10-CM | POA: Diagnosis not present

## 2024-06-26 DIAGNOSIS — E11621 Type 2 diabetes mellitus with foot ulcer: Secondary | ICD-10-CM | POA: Diagnosis not present

## 2024-06-26 DIAGNOSIS — L97329 Non-pressure chronic ulcer of left ankle with unspecified severity: Secondary | ICD-10-CM | POA: Diagnosis not present

## 2024-06-26 DIAGNOSIS — D500 Iron deficiency anemia secondary to blood loss (chronic): Secondary | ICD-10-CM

## 2024-06-26 LAB — BASIC METABOLIC PANEL WITH GFR
Anion gap: 12 (ref 5–15)
BUN: 9 mg/dL (ref 8–23)
CO2: 27 mmol/L (ref 22–32)
Calcium: 7.9 mg/dL — ABNORMAL LOW (ref 8.9–10.3)
Chloride: 92 mmol/L — ABNORMAL LOW (ref 98–111)
Creatinine, Ser: 1.1 mg/dL — ABNORMAL HIGH (ref 0.44–1.00)
GFR, Estimated: 55 mL/min — ABNORMAL LOW (ref >60)
Glucose, Bld: 150 mg/dL — ABNORMAL HIGH (ref 70–99)
Potassium: 4.4 mmol/L (ref 3.5–5.1)
Sodium: 131 mmol/L — ABNORMAL LOW (ref 135–145)

## 2024-06-26 LAB — CBC WITH DIFFERENTIAL/PLATELET
Abs Immature Granulocytes: 0.1 K/uL — ABNORMAL HIGH (ref 0.00–0.07)
Basophils Absolute: 0 K/uL (ref 0.0–0.1)
Basophils Relative: 0 %
Eosinophils Absolute: 0 K/uL (ref 0.0–0.5)
Eosinophils Relative: 0 %
HCT: 26.4 % — ABNORMAL LOW (ref 36.0–46.0)
Hemoglobin: 7.9 g/dL — ABNORMAL LOW (ref 12.0–15.0)
Immature Granulocytes: 1 %
Lymphocytes Relative: 8 %
Lymphs Abs: 1 K/uL (ref 0.7–4.0)
MCH: 24.6 pg — ABNORMAL LOW (ref 26.0–34.0)
MCHC: 29.9 g/dL — ABNORMAL LOW (ref 30.0–36.0)
MCV: 82.2 fL (ref 80.0–100.0)
Monocytes Absolute: 1.4 K/uL — ABNORMAL HIGH (ref 0.1–1.0)
Monocytes Relative: 12 %
Neutro Abs: 9.2 K/uL — ABNORMAL HIGH (ref 1.7–7.7)
Neutrophils Relative %: 79 %
Platelets: 227 K/uL (ref 150–400)
RBC: 3.21 MIL/uL — ABNORMAL LOW (ref 3.87–5.11)
RDW: 18.9 % — ABNORMAL HIGH (ref 11.5–15.5)
WBC: 11.7 K/uL — ABNORMAL HIGH (ref 4.0–10.5)
nRBC: 0 % (ref 0.0–0.2)

## 2024-06-26 MED ORDER — LINEZOLID 600 MG PO TABS: 600.0000 mg | ORAL_TABLET | Freq: Two times a day (BID) | ORAL | 0 refills | Status: AC

## 2024-06-26 MED ORDER — APIXABAN 5 MG PO TABS
5.0000 mg | ORAL_TABLET | Freq: Two times a day (BID) | ORAL | Status: DC
  Administered 2024-06-26: 5 mg via ORAL
  Filled 2024-06-26: qty 1

## 2024-06-26 MED ORDER — CLOPIDOGREL BISULFATE 75 MG PO TABS
75.0000 mg | ORAL_TABLET | Freq: Every day | ORAL | Status: DC
  Filled 2024-06-26: qty 1

## 2024-06-26 NOTE — Plan of Care (Signed)

## 2024-06-26 NOTE — Plan of Care (Signed)
  Problem: Education: Goal: Knowledge of General Education information will improve Description: Including pain rating scale, medication(s)/side effects and non-pharmacologic comfort measures 06/26/2024 0755 by Lang Hickory H, LPN Outcome: Progressing 06/26/2024 0754 by Lang Hickory DEL, LPN Outcome: Progressing   Problem: Health Behavior/Discharge Planning: Goal: Ability to manage health-related needs will improve Outcome: Progressing   Problem: Clinical Measurements: Goal: Ability to maintain clinical measurements within normal limits will improve 06/26/2024 0755 by Lang Hickory DEL, LPN Outcome: Progressing 06/26/2024 0754 by Lang Hickory DEL, LPN Outcome: Progressing   Problem: Activity: Goal: Risk for activity intolerance will decrease 06/26/2024 0755 by Lang Hickory H, LPN Outcome: Progressing 06/26/2024 0754 by Lang Hickory H, LPN Outcome: Progressing   Problem: Nutrition: Goal: Adequate nutrition will be maintained 06/26/2024 0755 by Lang Hickory DEL, LPN Outcome: Progressing 06/26/2024 0754 by Lang Hickory H, LPN Outcome: Progressing   Problem: Coping: Goal: Level of anxiety will decrease 06/26/2024 0755 by Lang Hickory DEL, LPN Outcome: Progressing 06/26/2024 0754 by Lang Hickory DEL, LPN Outcome: Progressing   Problem: Elimination: Goal: Will not experience complications related to bowel motility 06/26/2024 0755 by Lang Hickory DEL, LPN Outcome: Progressing 06/26/2024 0754 by Lang Hickory DEL, LPN Outcome: Progressing

## 2024-06-26 NOTE — Progress Notes (Signed)
 Patient has been discharged per MD order. IV has been removed, tolerated well. AVS instructions have been reviewed with patient, verbalized understanding. Patient is getting dressed with bed in lowest position and call light within reach.

## 2024-06-26 NOTE — Discharge Instructions (Addendum)
 To [name] or their caretakers,  You were recently admitted to [facility] for a fall and left leg wounds.   Continue taking your home medications with the following changes:  Start taking Linezolid  600 mg: 1 time tonight and 2 times every day for next 3 days only  Fe 325 mg : 3 days every week Stop taking Asprin Clopidogrel  Continue taking All other home medications  Please follow up with the following doctors/specialties: PCP within 1 week GI specialist  We recommend that you also see your primary care doctor in about a week to make sure that you continue to improve. We are so glad that you are feeling better.  Sincerely,  [facility]

## 2024-06-26 NOTE — Discharge Summary (Cosign Needed Addendum)
 Name: [name] MRN: [id number] DOB: [birth year] 68 y.o. PCP: Benjamine Lauraine DASEN, NP  Date of Admission: 06/23/2024  9:22 AM Date of Discharge: 06/26/2024 Attending Physician: Dr. Dayton Eastern  Discharge Diagnosis: 1. Principal Problem:   Blood loss PAD Left foot wound Iron Deficiency anemia AKI  Discharge Medications: Allergies as of 06/26/2024       Reactions   Cefepime  Anaphylaxis, Shortness Of Breath, Swelling, Dermatitis   Edoxaban Other (See Comments)   Causes hematomas   Penicillins Anaphylaxis   Barium Iodid Rash   Other    Preservatives  (C-Diff)   Acetaminophen     Will not take after Cyanide deaths, something changed when they mkt'd again.  Can take Childrens Liquid but sometimes upset stomach too   Albuterol  Hypertension   ABLE TO TOLERATE IN INHALER FORM NOT NEBULIZER    Bactrim [sulfamethoxazole-trimethoprim]    REACTION: antibiotic induced colitis   Beta Adrenergic Blockers    Paradoxical reactions   Prednisone      ibs problems   Propofol     REACTION: FEVER   Repatha [evolocumab] Other (See Comments)   Knocked her out for 10 days        Medication List     STOP taking these medications    clopidogrel  75 MG tablet Commonly known as: PLAVIX        TAKE these medications    Airsupra 90-80 MCG/ACT Aero Generic drug: Albuterol -Budesonide  Inhale 2 puffs into the lungs daily as needed (Asthma).   albuterol  108 (90 Base) MCG/ACT inhaler Commonly known as: VENTOLIN  HFA Inhale 2 puffs into the lungs 3 (three) times daily as needed for wheezing or shortness of breath.   apixaban  5 MG Tabs tablet Commonly known as: ELIQUIS  Take 1 tablet (5 mg total) by mouth 2 (two) times daily.   atorvastatin  80 MG tablet Commonly known as: LIPITOR  Take 80 mg by mouth daily.   Breztri  Aerosphere 160-9-4.8 MCG/ACT Aero inhaler Generic drug: budesonide -glycopyrrolate -formoterol  Inhale 2 puffs into the lungs in the morning and at bedtime.   Breztri   Aerosphere 160-9-4.8 MCG/ACT Aero inhaler Generic drug: budesonide -glycopyrrolate -formoterol  4 samples   cyanocobalamin 1000 MCG/ML injection Commonly known as: VITAMIN B12 Inject 1,000 mcg as directed every 30 (thirty) days.   dapagliflozin  propanediol 10 MG Tabs tablet Commonly known as: FARXIGA  Take 1 tablet (10 mg total) by mouth daily.   diltiazem  120 MG 24 hr capsule Commonly known as: CARDIZEM  CD Take 1 capsule (120 mg total) by mouth daily.   EPINEPHrine  0.3 mg/0.3 mL Soaj injection Commonly known as: EPI-PEN Inject 0.3 mg into the muscle as needed for anaphylaxis.   ezetimibe  10 MG tablet Commonly known as: ZETIA  Take 1 tablet (10 mg total) by mouth daily.   ferrous sulfate  325 (65 FE) MG tablet Take 325 mg by mouth 3 (three) times a week.   fluticasone  50 MCG/ACT nasal spray Commonly known as: FLONASE  Place 1 spray into both nostrils daily.   furosemide  20 MG tablet Commonly known as: LASIX  Take 20 mg by mouth daily as needed for fluid or edema.   linezolid  600 MG tablet Commonly known as: ZYVOX  Take 1 tablet (600 mg total) by mouth every 12 (twelve) hours.   losartan  50 MG tablet Commonly known as: COZAAR  Take 1 tablet (50 mg total) by mouth every evening.   MAGNESIUM  PO Take 500 mg by mouth daily.   metoprolol  tartrate 50 MG tablet Commonly known as: LOPRESSOR  Take 1 tablet (50 mg total) by mouth 2 (two) times daily.  nitroGLYCERIN  0.4 MG SL tablet Commonly known as: NITROSTAT  Place 0.4 mg under the tongue every 5 (five) minutes as needed for chest pain.   pantoprazole  40 MG tablet Commonly known as: PROTONIX  Take 1 tablet (40 mg total) by mouth daily before breakfast.   potassium chloride  SA 20 MEQ tablet Commonly known as: KLOR-CON  M Take 20 mEq by mouth daily as needed (Takes with Lasix ).   pregabalin  75 MG capsule Commonly known as: LYRICA  Take 75 mg by mouth 3 (three) times daily.   promethazine  12.5 MG tablet Commonly known as:  PHENERGAN  Take 12.5 mg by mouth 3 (three) times daily as needed for nausea or vomiting.   varenicline  0.5 MG tablet Commonly known as: Chantix  Take 1 tablet (0.5 mg total) by mouth 2 (two) times daily.   Vitamin D  (Ergocalciferol ) 1.25 MG (50000 UNIT) Caps capsule Commonly known as: DRISDOL  Take 50,000 Units by mouth once a week.               Discharge Care Instructions  (From admission, onward)           Start     Ordered   06/26/24 0000  Discharge wound care:       Comments: Cleanse L foot and ankle/leg wounds with Vashe wound cleanser Soila [phone]) do not rinse and allow to air dry. Apply Vashe moistened gauze to wound beds daily, cover with dry gauze and secure with Kerlix roll gauze beginning right above toes and ending above wounds.  May apply Ace bandage wrapped in same fashion as Kerlix for light compression.  06/24/24 0758   06/26/24 1406            Disposition and follow-up:   Ms.[name] was discharged from [facility] in Good condition.  At the hospital follow up visit please address:  1.  Her wounds in LLE. Any bleeding noted in stools.  -ask her if she completed her 3 day course of Linezolid : 1 tablet in PM post-discharge and BID for 3 days after that.   -Ask if she is taking her iron pills  -Her Plavix  and Asprin were stopped. She is only on eliquis  now -Ask about home inhaler as pt mentioned not having enough symptom control with current use  -Needs a regular wound check-up  -Needs to f/u with GI for a colonoscopy  2.  Labs / imaging needed at time of follow-up: CBC, BMP  3.  Pending labs/ test needing follow-up: none  Follow-up Appointments: -PCP -GI   Hospital Course by problem list:   [name] is a 68 y.o. person living with a history of COPD, asthma, STEMI, hypertension, hyperlipidemia, claudication peripheral vascular disease, CABG in 2017 who presented with fall and admitted for the same.     Fall Pt came in with a hx of fall 3 weeks prior to admission and another one 3 days prior. She tripped and fell on her left ankle and cut her skin both the times. She had 2 wounds on left lateral ankle and dorsal surface of the left foot. Patient was anemic with hemoglobin levels on admission 5.1 (baseline 10.8-11.1).  Patient was also hypotensive.  She notes that when she gets up and turns, she starts feeling dizzy like she's on a roller coaster. Pt thinks its probably due to her metoprolol  use. Her orthostatics were normal. Her hemoglobin levels, hypotension, falls are likely secondary to profuse blood loss from her left foot wounds versus dehydration from less p.o.  intake. Patient was afebrile and looked extremely weak. Received transfusion with IV NS. Encourage good PO intake. Her hgb levels stabilized around 8.6-8.7 later and pt said she felt much better than when she came in. She looked stable on her discharge day PE.    Left foot wounds Cellulitis Patient had developed these recent wounds that appeared to be nonhealing in nature. She was a smoker. She was seeing her Cardiologist, Dr. Ladona for her PAD. On admission, the wounds were bleeding profusely and we noticed some swelling in her toes of the left foot. She was starteed on IV linezolid  due to concerns for osteomyelitis/cellulits and also for elevated WBC count which was over her baseline of 12. MRI showed left foot having a possibility of osteonecrosis and small fluid collection which could be an abscess. Continued her linezolid  but changed it to PO. Her ABIs were positive for BL LE arterial disease and her BL TBIs had decreased from her previous exam on 02/2024. Vascular surgery was consulted but asked us  to reach out to pt's cardiologist Dr. Ladona first. Reached out to Dr. Ladona who said that pt does not have critical limb ischemia and that she had stable claudication. He recommended her for an OP f/u for her wounds. We asked pt to see a  podiatrist for wound evaluations but she recommended her PCP sees them almost weekly. Discussed with Dr. Ladona who recommended stopping plavix . Pt was stable today and was ready to be discharged.   Iron deficiency anemia While blood loss from wounds can cause anemia, suspect other causes for her low levels. Her iron panel showed low iron levels and ferritin being on the low-normal end. Will hold starting her on iron repletion IP for concerns of bacterial infection. Asked pt to restart oral iron pills for iron deficiency OP. Pt hasn't had a colonoscopy in the last 10 years. No changes or blood noted in stools. Said she had polyps in last colonoscopy. Asked pt to f/u with GI post discharge. Pt has a hx of taking asprin only when she has some pain in her chest. Asked her to stop taking it as that could increase any bleeding she might have.      AKI Cr on admission 2.3. Likely 2/2 to dehydration vs hypotension. Received NS on admission. Cr levels continued to improve everyday  with good PO intake and after transfusion.    COPD Asthma Pt had wheezes on her PE. However, with no fever and negative x-ray we were not concerned about PNA. Stopped her albuterol  as she mentioned having a negative reaction with it. She said her SOB was not contoleed with her breztrii so we added triple therapy without the steroid portion due to allergy. Continue home breztrii. F/u with PCP for inhaler modifications.    Hx Paroxysmal Atrial Fibrillation On eliquis  which was held for her bleeding. Continued to receive her home metoprolol  and cardizem . Can continue taking her eliquis  post-discharge.   HLD: Received home atorvastatin  and ezetimibe . Can continue to take it at home.    DM Stopped Farxiga  due to AKI. A1c rechecked which was 6.0. Can continue with home farxiga .    HTN BP soft on admission. Home losartan  was held till BP normalized.     Subjective Saw pt at bedside today. Said she was feeling well. No fevers,  chills. Mentioned feeling dizzy when she gets up and turns sometimes which has happened in the past--thinks it is because of her metoprolol  use. Denies any other acute complaints.  Discharge Exam:   BP 129/60 (BP Location: Right Arm)   Pulse 72   Temp (!) 97.4 F (36.3 C)   Resp 17   Ht 5' 3 (1.6 m)   Wt 54 kg   SpO2 100%   BMI 21.08 kg/m  Discharge exam:   Gen: A&Ox3, in no acute distress Cardiovascular: RRR-no m/r/g Skin: pale; LLE: foot and ankle covered in dressing Psych: Normal Affect   Pertinent Labs, Studies, and Procedures:     Latest Ref Rng & Units 06/26/2024    3:33 AM 06/25/2024    4:35 AM 06/24/2024    2:29 AM  CBC  WBC 4.0 - 10.5 K/uL 11.7  16.0  22.0   Hemoglobin 12.0 - 15.0 g/dL 7.9  8.7  8.6   Hematocrit 36.0 - 46.0 % 26.4  27.6  25.8   Platelets 150 - 400 K/uL 227  226  235        Latest Ref Rng & Units 06/26/2024    3:33 AM 06/25/2024    4:35 AM 06/24/2024    2:29 AM  CMP  Glucose 70 - 99 mg/dL 849  888  876   BUN 8 - 23 mg/dL 9  13  26    Creatinine 0.44 - 1.00 mg/dL 8.89  8.74  8.16   Sodium 135 - 145 mmol/L 131  131  128   Potassium 3.5 - 5.1 mmol/L 4.4  3.6  3.5   Chloride 98 - 111 mmol/L 92  92  89   CO2 22 - 32 mmol/L 27  29  28    Calcium  8.9 - 10.3 mg/dL 7.9  8.1  8.0     VAS US  ABI WITH/WO TBI Result Date: 06/24/2024  LOWER EXTREMITY DOPPLER STUDY Patient Name:  ANAIYAH ANGLEMYER  Date of Exam:   06/24/2024 Medical Rec #: [id number]          Accession #:    [id number] Date of Birth: April 29, 1956          Patient Gender: F Patient Age:   33 years Exam Location:  [facility] Procedure:      VAS US  ABI WITH/WO TBI Referring Phys: JULIE WILLIAMS --------------------------------------------------------------------------------  Indications: Left foot and ankle ulceration from fall in July 2025. Patient fell              again recently and reinjured foot. Cellulitis. High Risk Factors: Hypertension, hyperlipidemia, current smoker, coronary artery                     disease (CABG 2017).. Other Factors: PAF, on Eliquis .  Vascular Interventions: S/P right SFA arthrectomy 02/02/24 by Dr. Ladona. S/P left                         SFA orbital atherectomy with 1.5 mm solid crown                         diamondback catheter followed by drug-coated balloon                         angioplasty 01/28/2023 by Dr. Ladona. Comparison Study: Prior study done 03/05/2024 Performing Technologist: Rachel Pellet RVS  Examination Guidelines: A complete evaluation includes at minimum, Doppler waveform signals and systolic blood pressure reading at the level of bilateral brachial, anterior tibial, and posterior tibial arteries, when vessel segments are accessible. Bilateral testing is considered an  integral part of a complete examination. Photoelectric Plethysmograph (PPG) waveforms and toe systolic pressure readings are included as required and additional duplex testing as needed. Limited examinations for reoccurring indications may be performed as noted.  ABI Findings: +---------+------------------+-----+---------+--------+ Right    Rt Pressure (mmHg)IndexWaveform Comment  +---------+------------------+-----+---------+--------+ Brachial 123                    triphasic         +---------+------------------+-----+---------+--------+ PTA      107               0.87 biphasic          +---------+------------------+-----+---------+--------+ DP       98                0.80 biphasic          +---------+------------------+-----+---------+--------+ Great Toe56                0.46 Abnormal          +---------+------------------+-----+---------+--------+ +---------+------------------+-----+---------+---------------------------------+ Left     Lt Pressure (mmHg)IndexWaveform Comment                           +---------+------------------+-----+---------+---------------------------------+ Brachial                        triphasicpatient asked not to have BP                                                taken secondary to wound on elbow +---------+------------------+-----+---------+---------------------------------+ PTA      79                0.64 biphasic                                   +---------+------------------+-----+---------+---------------------------------+ DP       76                0.62 biphasic                                   +---------+------------------+-----+---------+---------------------------------+ Great Toe18                0.15 Abnormal                                   +---------+------------------+-----+---------+---------------------------------+ +-------+-----------+-----------+------------+------------+ ABI/TBIToday's ABIToday's TBIPrevious ABIPrevious TBI +-------+-----------+-----------+------------+------------+ Right  0.87       0.46       0.98        0.41         +-------+-----------+-----------+------------+------------+ Left   0.64       0.15       0.76        0.51         +-------+-----------+-----------+------------+------------+ Bilateral ABIs appear decreased compared to prior study on 03/05/2024. Left TBIs appear decreased compared to prior study on 03/05/2024. Right TBIs appear essentially unchanged compared to prior study done 03/05/24.  Summary: Right: Resting right ankle-brachial index indicates mild right lower extremity arterial disease. The right toe-brachial index is abnormal.  Left:  Resting left ankle-brachial index indicates moderate left lower extremity arterial disease. The left toe-brachial index is abnormal.  *See table(s) above for measurements and observations.  Electronically signed by Gaile New MD on 06/24/2024 at 8:26:30 PM.    Final    MR FOOT LEFT WO CONTRAST Result Date: 06/24/2024 MR FOOT WITHOUT IV CONTRAST COMPARISON: None. CLINICAL HISTORY: Wounds PULSE SEQUENCES: Ax T1, Ax T2 FS, Sag T1, Sag T2 FS, Cor STIR, Cor T1 without contrast. FINDINGS: Mild degenerative  changes are seen in the forefoot. There is abnormal signal in the fifth metatarsal head which is nonspecific. This could be posttraumatic or related to necrosis. Cyst not have the classic appearance of osteomyelitis. There is a dorsal wound in the midfoot with a small fluid collection best seen on coronal image 3 and sagittal image 19. This measures approximately 4 x 16 mm in size. No large drainable collection is appreciated. There are other women's identified dorsally over the forefoot. Musculotendinous structures demonstrate no significant abnormality. IMPRESSION: Bones and the dorsal aspect of the foot with a small fluid collection dorsal to the third metatarsal base. This could reflect a small abscess in the appropriate clinical setting. Abnormal signal in the fifth metatarsal head and distal metatarsal which is nonspecific. This could be posttraumatic or related osteonecrosis. No obvious destructive changes are identified to suggest osteomyelitis. Follow-up MRI if symptoms progress. Electronically signed by: Norleen Satchel MD 06/24/2024 01:13 PM EDT RP Workstation: [id number]   MR ANKLE LEFT WO CONTRAST Result Date: 06/24/2024 MR ANKLE WITHOUT IV CONTRAST COMPARISON: None. CLINICAL HISTORY: Wounds PULSE SEQUENCES: Ax T1, Ax T2 FS, Sag T1, Sag T2 FS, Cor STIR, Ax T1 FS FINDINGS: Bones: There is no fracture or marrow replacing process. No accelerated arthrosis is present. No joint effusion is identified. There is a dorsal wound is identified at the level of the midfoot with dermal thickening and moderate subcutaneous edema. There is a small focal collection in the dorsum of the foot at the level of the proximal third metatarsal measuring approximately 3 x 17 mm in maximum size. This is best seen on coronal image 1 and sagittal image 9. There is a second moderate-sized wound along the lateral ankle overlying the lateral malleolus with soft tissue gas. This is best seen on axial image 14 through 21. No large  drainable collection is appreciated. Ligaments: The anterior and posterior tibiofibular and talofibular ligaments are intact. Deltoid ligament and spring ligaments are intact. The sinus tarsi is unremarkable. Musculotendinous structures: The tibialis anterior, extensor digitorum and extensor hallucis longus tendons are unremarkable. The posterior tibial tendon, flexor hallucis longus and flexor digitorum tendons are unremarkable. Likely mild reactive peroneal tenosynovitis is present without subluxation of the tendons. The Achilles tendon and plantar fascia are unremarkable. IMPRESSION: Moderate-sized wound in the lateral aspect of the ankle overlying the lateral malleolus and in the dorsum of the foot at the level of the base of the third metatarsal as above. Is a small dorsal fluid collection. No large drainable collection is appreciated. No evidence of osteomyelitis involving the ankle. Mild degenerative changes are present. Electronically signed by: Norleen Satchel MD 06/24/2024 01:09 PM EDT RP Workstation: [id number]   DG Chest Portable 1 View Result Date: 06/23/2024 CLINICAL DATA:  Cough EXAM: PORTABLE CHEST 1 VIEW COMPARISON:  June 23, 2023 FINDINGS: No focal consolidation.  No pleural effusions.  No pneumothorax. Unchanged cardiomediastinal silhouette. Sequelae of prior CABG. Intact sternotomy wires. Partially imaged left shoulder arthroplasty hardware. IMPRESSION: No active disease. Electronically Signed  By: Michaeline Blanch M.D.   On: 06/23/2024 11:38   DG Ankle Left Port Result Date: 06/23/2024 CLINICAL DATA:  Wound infection EXAM: PORTABLE LEFT ANKLE - 2 VIEW COMPARISON:  None Available. FINDINGS: There is no evidence of fracture, dislocation, or joint effusion. There is no evidence of arthropathy or other focal bone abnormality. Mild soft tissue swelling. IMPRESSION: No acute osseous findings. Electronically Signed   By: Michaeline Blanch M.D.   On: 06/23/2024 11:37   DG Foot Complete Left Result  Date: 06/23/2024 CLINICAL DATA:  Wound infection EXAM: LEFT FOOT - COMPLETE 3+ VIEW COMPARISON:  Same day ankle radiographs FINDINGS: There is no evidence of fracture or dislocation. There is no evidence of arthropathy or other focal bone abnormality. Mild soft tissue swelling. IMPRESSION: No acute osseous findings. Electronically Signed   By: Michaeline Blanch M.D.   On: 06/23/2024 11:36     Discharge Instructions: Discharge Instructions     Call MD for:  persistant dizziness or light-headedness   Complete by: As directed    Call MD for:  redness, tenderness, or signs of infection (pain, swelling, redness, odor or green/yellow discharge around incision site)   Complete by: As directed    Call MD for:  severe uncontrolled pain   Complete by: As directed    Diet - low sodium heart healthy   Complete by: As directed    Discharge wound care:   Complete by: As directed    Cleanse L foot and ankle/leg wounds with Vashe wound cleanser Soila [phone]) do not rinse and allow to air dry. Apply Vashe moistened gauze to wound beds daily, cover with dry gauze and secure with Kerlix roll gauze beginning right above toes and ending above wounds.  May apply Ace bandage wrapped in same fashion as Kerlix for light compression.  06/24/24 0758   Increase activity slowly   Complete by: As directed        Signed: Edgardo Pontiff, DO 06/26/2024, 2:11 PM

## 2024-06-26 NOTE — Progress Notes (Signed)
 Wound to LLE dressing changed. Tolerated well.

## 2024-06-26 NOTE — Care Management Important Message (Signed)
 Important Message  Patient Details  Name: LEXINGTON DEVINE MRN: [id number] Date of Birth: 04-03-1956   Important Message Given:  Yes - Medicare IM     Jon Cruel 06/26/2024, 4:06 PM

## 2024-06-28 ENCOUNTER — Encounter (HOSPITAL_COMMUNITY): Payer: Self-pay

## 2024-06-28 ENCOUNTER — Ambulatory Visit (HOSPITAL_COMMUNITY)
Admission: RE | Admit: 2024-06-28 | Discharge: 2024-06-28 | Disposition: A | Discharge status: Home / Self Care | Source: Ambulatory Visit | Attending: Cardiology | Admitting: Cardiology

## 2024-06-28 DIAGNOSIS — I739 Peripheral vascular disease, unspecified: Secondary | ICD-10-CM

## 2024-11-18 DEATH — deceased

## 2024-11-22 ENCOUNTER — Encounter: Payer: Self-pay | Admitting: Neurology

## 2024-11-22 ENCOUNTER — Ambulatory Visit: Admitting: Adult Health

## 2024-11-22 ENCOUNTER — Encounter: Payer: Self-pay | Admitting: Adult Health

## 2024-11-22 NOTE — Progress Notes (Unsigned)
 "   PATIENT: [name] DOB: 1956-05-08  REASON FOR VISIT: follow up HISTORY FROM: patient PRIMARY NEUROLOGIST:   No chief complaint on file.    HISTORY OF PRESENT ILLNESS: Today   [name] is a 69 y.o. female who has been followed in this office for ***. Returns today for follow-up.   HISTORY (copied from Dr. Sharion note) 02/20/2024: She has an appointment with Dr. Jackquline in June for formal memory testing. But so far all indications do not point to alzheimer's she has not gone to PT for balance. We discussed managing vascular risk factors, she has uncontrolled diabetes and doesn't eat well and also still smoking despite COPD and on Oxygen at night she follows with pulmonology. Felt better with increased O2 at night states she feels better on 4L of oxygen.  No other focal neurologic deficits, associated symptoms, inciting events or modifiable factors.Patient complains of symptoms per HPI as well as the following symptoms: none . Pertinent negatives and positives per HPI. All others negative  Patient complains of symptoms per HPI as well as the following symptoms: none . Pertinent negatives and positives per HPI. All others negative     HPI:  [name] is a 69 y.o. female here as requested by Benjamine Lauraine DASEN, NP for post-concussive symptoms.  has CLOSTRIDIUM DIFFICILE COLITIS; Hyperlipidemia; Anxiety state; ANXIETY DEPRESSION; TOBACCO ABUSE; Migraine; Hereditary and idiopathic peripheral neuropathy; ENDOTHELIAL CORNEAL DYSTROPHY; PERIPHERAL VASCULAR DISEASE; DYSPLASTIC NEVUS, BACK; Asthma; GERD; Diverticulosis of colon; IRRITABLE BOWEL SYNDROME; BREAST MASS; SEBACEOUS CYST; DEGENERATIVE DISC DISEASE, LUMBAR SPINE; BACK PAIN, LUMBAR; Backache; WEIGHT LOSS; OTHER DYSPHAGIA; ABDOMINAL PAIN -GENERALIZED; GASTROINTESTINAL XRAY, ABNORMAL; CONTUSION, HIP, RIGHT; 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HCC); Essential hypertension; Chronic pain syndrome; Overweight (BMI 25.0-29.9); Coronary  artery disease involving native coronary artery of native heart with unstable angina pectoris (HCC); Palpitations; Renal artery stenosis, non-flow-limiting (HCC); Hx of CABG; Anaphylactic reaction; COPD with chronic bronchitis (HCC); Sepsis (HCC); Diverticulitis; and H/O total shoulder replacement, left on their problem list.   I reviewed Dr. Sena notes from ED visit 06/23/2023: Patient with past medical history coronary artery disease status post MI, peripheral artery disease with claudication, COPD on 4 L home O2 presented to the emergency department after a fall with intermittent memory difficulties.  A week prior she had a fall and struck the back of her head on the ground, had confusion with repetitive questioning for approximately 3 hours after the event but patient reported to emergency room doctor that this had improved however she still did have repetitive questioning.  She reported some mild memory difficulties prior to the fall but worse afterwards.  CTs were ordered, patient increased risk of bleeding due to blood thinners, she denied numbness tingling weakness or any other neurologic complaints.  On Eliquis  and Plavix  per ED notes when she was seen 06/23/2023.  Her neurologic exam was normal.  She did have some abnormalities on her blood work including slightly lowered sodium 132, potassium 2.8, chloride 90, elevated glucose 147, elevated creatinine 1.14 with a lower GFR also white blood cells elevated 15.2, hemoglobin and hematocrit decreased 10.8 and 35.5, platelets 421.  The emergency room repleted her potassium and magnesium , ordered CT head, maxillofacial and CT C-spine, unremarkable incidental right upper lobe consolidation stated may be consistent with pneumonia.  They suspected patient likely suffered a concussion.  They treated her leukocytosis with azithromycin .  An x-ray was also ordered of her chest, which showed no consolidation pneumothorax or effusion, no edema, normal cardio  pericardial silhouette, calcified aorta, findings from prior coronary artery stents.  Patient and daughter report: She had a fall, she was down, disoriented, didn;t know where she was, hit the back of her head, knew she was at home but had confusion, she doesn;t know why she fell, she fell getting out of bed, nobody saw her, no history of seizures. She couldn;t name fan, she fell 330-4am and husband helped her at 6am they don;t sleep in the same room. Daughter states she has shaking on one or the other even before the fall, daughter provides most informaytion, daughter states it looks like parkinson's disease, squeezing on something helps, the tremor is at rest and when doing things. No fhx of tremors. Started having cognitive decline 5-7 years ago, daughter states forgetting meds especially after the fall, after the fall she was confused lasted a few weeks, Memory still not where it was prior to the falls but improved after head trauma. The memory loss may even go back to 2011 per patient. Daughter noticed prior to the fall she didn;t know the day of the week, explaining things made her overwhelmed, word finding, daughter noticed it more after the heart attack she declined and after the concussion declined.  Last shot B12 3 weeks ago. Has headaches severe if she has an orgasm. Paternal Aunt and Uncle had early-onset alzheimers disease. Both parents unknown history and died young. No loss of smell/taste, no REM sleep disorder, does have imbalance and cannot close her eyes when standing she does not know where she is in space. Per daughter, She eats terribly, at the ED UA showed > 500 glucose. She has a history of migraines improved after MI and stenting per patient  Reviewed notes, labs and imaging from outside physicians, which showed:  06/2023:  CLINICAL DATA:  Trauma   EXAM: CT HEAD WITHOUT CONTRAST   CT MAXILLOFACIAL WITHOUT CONTRAST   CT CERVICAL SPINE WITHOUT CONTRAST    TECHNIQUE: Multidetector CT imaging of the head, cervical spine, and maxillofacial structures were performed using the standard protocol without intravenous contrast. Multiplanar CT image reconstructions of the cervical spine and maxillofacial structures were also generated.   RADIATION DOSE REDUCTION: This exam was performed according to the departmental dose-optimization program which includes automated exposure control, adjustment of the mA and/or kV according to patient size and/or use of iterative reconstruction technique.   COMPARISON:  None Available.   FINDINGS: CT HEAD FINDINGS   Brain: No evidence of acute infarction, hemorrhage, hydrocephalus, extra-axial collection or mass lesion/mass effect.   Vascular: No hyperdense vessel or unexpected calcification.   Skull: Normal. Negative for fracture or focal lesion.   Other: None.   CT MAXILLOFACIAL FINDINGS   Osseous: No fracture or mandibular dislocation. No destructive process.   Orbits: Bilateral lens replacement. Otherwise no evidence of traumatic injury.   Sinuses: No middle ear or mastoid effusion. Complete opacification of the left maxillary sinus.   Soft tissues: Negative.   CT CERVICAL SPINE FINDINGS   Alignment: Grade 1 anterolisthesis of C3 on C4 and C4 on C5. Trace retrolisthesis of C5 on C6.   Skull base and vertebrae: No acute fracture. No primary bone lesion or focal pathologic process.   Soft tissues and spinal canal: No prevertebral fluid or swelling. No visible canal hematoma.   Disc levels:  No evidence of high-grade spinal canal stenosis.   Upper chest: Clustered nodularity in the right upper lobe and bronchial wall thickening, favored to be infectious or inflammatory.   Other:  None   IMPRESSION: 1. No acute intracranial abnormality. 2. No acute facial bone fracture. 3. No acute fracture or traumatic malalignment of the cervical spine. 4. Clustered nodularity in the right upper  lobe and bronchial wall thickening, favored to be infectious or inflammatory.    REVIEW OF SYSTEMS: Out of a complete 14 system review of symptoms, the patient complains only of the following symptoms, and all other reviewed systems are negative.   Listed in HPI  ALLERGIES: Allergies[1]  HOME MEDICATIONS: Outpatient Medications Prior to Visit  Medication Sig Dispense Refill   AIRSUPRA 90-80 MCG/ACT AERO Inhale 2 puffs into the lungs daily as needed (Asthma).     albuterol  (PROVENTIL  HFA;VENTOLIN  HFA) 108 (90 BASE) MCG/ACT inhaler Inhale 2 puffs into the lungs 3 (three) times daily as needed for wheezing or shortness of breath.     apixaban  (ELIQUIS ) 5 MG TABS tablet Take 1 tablet (5 mg total) by mouth 2 (two) times daily. 180 tablet 3   atorvastatin  (LIPITOR ) 80 MG tablet Take 80 mg by mouth daily.     Budeson-Glycopyrrol-Formoterol  (BREZTRI  AEROSPHERE) 160-9-4.8 MCG/ACT AERO Inhale 2 puffs into the lungs in the morning and at bedtime. 10.7 g 6   budesonide -glycopyrrolate -formoterol  (BREZTRI  AEROSPHERE) 160-9-4.8 MCG/ACT AERO inhaler 4 samples     cyanocobalamin (VITAMIN B12) 1000 MCG/ML injection Inject 1,000 mcg as directed every 30 (thirty) days.     dapagliflozin  propanediol (FARXIGA ) 10 MG TABS tablet Take 1 tablet (10 mg total) by mouth daily. (Patient not taking: Reported on 06/23/2024) 90 tablet 3   diltiazem  (CARDIZEM  CD) 120 MG 24 hr capsule Take 1 capsule (120 mg total) by mouth daily. 90 capsule 3   EPINEPHrine  0.3 mg/0.3 mL IJ SOAJ injection Inject 0.3 mg into the muscle as needed for anaphylaxis.     ezetimibe  (ZETIA ) 10 MG tablet Take 1 tablet (10 mg total) by mouth daily. 90 tablet 3   ferrous sulfate  325 (65 FE) MG tablet Take 325 mg by mouth 3 (three) times a week.     fluticasone  (FLONASE ) 50 MCG/ACT nasal spray Place 1 spray into both nostrils daily.     furosemide  (LASIX ) 20 MG tablet Take 20 mg by mouth daily as needed for fluid or edema.     linezolid  (ZYVOX ) 600 MG  tablet Take 1 tablet (600 mg total) by mouth every 12 (twelve) hours. 7 tablet 0   losartan  (COZAAR ) 50 MG tablet Take 1 tablet (50 mg total) by mouth every evening. 90 tablet 3   magnesium  gluconate (MAGONATE) 500 (27 Mg) MG TABS tablet Take 500 mg by mouth daily.     metoprolol  tartrate (LOPRESSOR ) 50 MG tablet Take 1 tablet (50 mg total) by mouth 2 (two) times daily. 180 tablet 3   nitroGLYCERIN  (NITROSTAT ) 0.4 MG SL tablet Place 0.4 mg under the tongue every 5 (five) minutes as needed for chest pain.     pantoprazole  (PROTONIX ) 40 MG tablet Take 1 tablet (40 mg total) by mouth daily before breakfast. 90 tablet 0   potassium chloride  SA (KLOR-CON  M) 20 MEQ tablet Take 20 mEq by mouth daily as needed (Takes with Lasix ).     pregabalin  (LYRICA ) 75 MG capsule Take 75 mg by mouth 3 (three) times daily.     promethazine  (PHENERGAN ) 12.5 MG tablet Take 12.5 mg by mouth 3 (three) times daily as needed for nausea or vomiting.     varenicline  (CHANTIX ) 0.5 MG tablet Take 1 tablet (0.5 mg total) by mouth 2 (two) times  daily. (Patient not taking: Reported on 06/23/2024) 60 tablet 3   Vitamin D , Ergocalciferol , (DRISDOL ) 1.25 MG (50000 UNIT) CAPS capsule Take 50,000 Units by mouth once a week.     No facility-administered medications prior to visit.    PAST MEDICAL HISTORY: Past Medical History:  Diagnosis Date   Acute diverticulitis 03/30/2020   Acute ST elevation myocardial infarction (STEMI) involving other coronary artery of inferior wall (HCC) 09/23/2021   Acute ST elevation myocardial infarction (STEMI) of inferior wall (HCC) 09/22/2021   Asthma    Benign essential HTN    Claudication in peripheral vascular disease    Coronary artery disease    Hyperlipidemia    IBS (irritable bowel syndrome)    Migraine headache     PAST SURGICAL HISTORY: Past Surgical History:  Procedure Laterality Date   ABDOMINAL AORTOGRAM W/LOWER EXTREMITY N/A 01/28/2023   Procedure: ABDOMINAL AORTOGRAM W/LOWER  EXTREMITY;  Surgeon: Ladona Heinz, MD;  Location: [facility];  Service: Cardiovascular;  Laterality: N/A;   CORONARY/GRAFT ACUTE MI REVASCULARIZATION N/A 09/22/2021   Procedure: Coronary/Graft Acute MI Revascularization;  Surgeon: Ladona Heinz, MD;  Location: [facility];  Service: Cardiovascular;  Laterality: N/A;   LEFT HEART CATH AND CORONARY ANGIOGRAPHY N/A 09/22/2021   Procedure: LEFT HEART CATH AND CORONARY ANGIOGRAPHY;  Surgeon: Ladona Heinz, MD;  Location: [facility];  Service: Cardiovascular;  Laterality: N/A;   LOWER EXTREMITY ANGIOGRAPHY  02/02/2024   Procedure: Lower Extremity Angiography;  Surgeon: Ladona Heinz, MD;  Location: [facility] CV [facility];  Service: Cardiovascular;;   LOWER EXTREMITY INTERVENTION  02/02/2024   Procedure: LOWER EXTREMITY INTERVENTION;  Surgeon: Ladona Heinz, MD;  Location: [facility];  Service: Cardiovascular;;  RT SFA atherectomy and DCB   PERIPHERAL VASCULAR ATHERECTOMY  01/28/2023   Procedure: PERIPHERAL VASCULAR ATHERECTOMY;  Surgeon: Ladona Heinz, MD;  Location: [facility] INVAS[facility] CV LAB;  Service: Cardiovascular;;   PERIPHERAL VASCULAR BALLOON ANGIOPLASTY  01/28/2023   Procedure: PERIPHERAL VASCULAR BALLOON ANGIOPLASTY;  Surgeon: Ladona Heinz, MD;  Location: [facility];  Service: Cardiovascular;;   REVERSE SHOULDER ARTHROPLASTY Left 09/16/2021   Procedure: REVERSE SHOULDER ARTHROPLASTY;  Surgeon: Kay Kemps, MD;  Location: [facility];  Service: Orthopedics;  Laterality: Left;    FAMILY HISTORY: Family History  Problem Relation Age of Onset   Hypertension Mother    Cancer - Other Father        Oropharyngeal SCCa   CAD Brother        Deceased on MI age 51   Seizures Maternal Uncle    Alzheimer's disease Paternal Aunt    Alzheimer's disease Paternal Uncle     SOCIAL HISTORY: Social History   Socioeconomic History   Marital status: Married    Spouse name: Not on file   Number of children: Not on file   Years of education: Not on  file   Highest education level: Not on file  Occupational History   Not on file  Tobacco Use   Smoking status: Some Days    Current packs/day: 1.00    Types: Cigarettes   Smokeless tobacco: Never   Tobacco comments:    >5 cigarettes per day  Vaping Use   Vaping status: Never Used  Substance and Sexual Activity   Alcohol use: Yes    Alcohol/week: 0.0 standard drinks of alcohol    Comment: Occasional to Rare   Drug use: No   Sexual activity: Not on file  Other Topics Concern  Not on file  Social History Narrative   Not on file   Social Drivers of Health   Tobacco Use: High Risk (08/21/2024)   Received from [facility]   Patient History    Smoking Tobacco Use: Every Day    Smokeless Tobacco Use: Current    Passive Exposure: Current  Financial Resource Strain: Low Risk (07/24/2024)   Received from [facility]   Overall Financial Resource Strain (CARDIA)    How hard is it for you to pay for the very basics like food, housing, medical care, and heating?: Not hard at all  Food Insecurity: No Food Insecurity (07/24/2024)   Received from [facility]   Epic    Within the past 12 months, you worried that your food would run out before you got the money to buy more.: Never true    Within the past 12 months, the food you bought just didn't last and you didn't have money to get more.: Never true  Transportation Needs: No Transportation Needs (07/24/2024)   Received from [facility]    In the past 12 months, has lack of transportation kept you from medical appointments or from getting medications?: No    In the past 12 months, has lack of transportation kept you from meetings, work, or from getting things needed for daily living?: No  Physical Activity: Not on file  Stress: Not on file  Social Connections: Moderately Isolated (06/24/2024)   Social Connection and Isolation Panel    Frequency of Communication with Friends and Family: More than three times a week     Frequency of Social Gatherings with Friends and Family: Twice a week    Attends Religious Services: Never    Database Administrator or Organizations: No    Attends Banker Meetings: Never    Marital Status: Married  Catering Manager Violence: Not At Risk (06/23/2024)   Epic    Fear of Current or Ex-Partner: No    Emotionally Abused: No    Physically Abused: No    Sexually Abused: No  Depression (PHQ2-9): Not on file  Alcohol Screen: Not on file  Housing: Low Risk (07/24/2024)   Received from [facility]    In the last 12 months, was there a time when you were not able to pay the mortgage or rent on time?: No    In the past 12 months, how many times have you moved where you were living?: 0    At any time in the past 12 months, were you homeless or living in a shelter (including now)?: No  Utilities: Not At Risk (07/24/2024)   Received from [facility]    In the past 12 months has the electric, gas, oil, or water  company threatened to shut off services in your home?: No  Health Literacy: Not on file      PHYSICAL EXAM  There were no vitals filed for this visit. There is no height or weight on file to calculate BMI.  ***Generalized: Well developed, in no acute distress   Neurological examination  Mentation: Alert oriented to time, place, history taking. Follows all commands speech and language fluent Cranial nerve II-XII: Pupils were equal round reactive to light. Extraocular movements were full, visual field were full on confrontational test. Facial sensation and strength were normal. Uvula tongue midline. Head turning and shoulder shrug  were normal and symmetric. Motor: The motor testing reveals 5 over 5  strength of all 4 extremities. Good symmetric motor tone is noted throughout.  Sensory: Sensory testing is intact to soft touch on all 4 extremities. No evidence of extinction is noted.  Coordination: Cerebellar testing reveals good  finger-nose-finger and heel-to-shin bilaterally.  Gait and station: Gait is normal. Tandem gait is normal. Romberg is negative. No drift is seen.  Reflexes: Deep tendon reflexes are symmetric and normal bilaterally.   DIAGNOSTIC DATA (LABS, IMAGING, TESTING) - I reviewed patient records, labs, notes, testing and imaging myself where available.  Lab Results  Component Value Date   WBC 11.7 (H) 06/26/2024   HGB 7.9 (L) 06/26/2024   HCT 26.4 (L) 06/26/2024   MCV 82.2 06/26/2024   PLT 227 06/26/2024      Component Value Date/Time   NA 131 (L) 06/26/2024 0333   NA 136 01/23/2024 0920   K 4.4 06/26/2024 0333   CL 92 (L) 06/26/2024 0333   CO2 27 06/26/2024 0333   GLUCOSE 150 (H) 06/26/2024 0333   BUN 9 06/26/2024 0333   BUN 20 01/23/2024 0920   CREATININE 1.10 (H) 06/26/2024 0333   CREATININE 0.92 10/28/2022 1340   CALCIUM  7.9 (L) 06/26/2024 0333   PROT 5.1 (L) 06/23/2024 1002   ALBUMIN 2.8 (L) 06/23/2024 1002   AST 55 (H) 06/23/2024 1002   AST 13 (L) 10/28/2022 1340   ALT 35 06/23/2024 1002   ALT 9 10/28/2022 1340   ALKPHOS 58 06/23/2024 1002   BILITOT 0.7 06/23/2024 1002   BILITOT 0.2 (L) 10/28/2022 1340   GFRNONAA 55 (L) 06/26/2024 0333   GFRNONAA >60 10/28/2022 1340   GFRAA >60 03/31/2020 0540   Lab Results  Component Value Date   CHOL 145 12/16/2023   HDL 58 12/16/2023   LDLCALC 70 12/16/2023   LDLDIRECT 173.8 10/24/2009   TRIG 90 12/16/2023   CHOLHDL 2.5 12/16/2023   Lab Results  Component Value Date   HGBA1C 6.0 (H) 06/24/2024   Lab Results  Component Value Date   VITAMINB12 1,350 (H) 06/25/2024   Lab Results  Component Value Date   TSH 1.340 09/20/2023      ASSESSMENT AND PLAN 69 y.o. year old female  has a past medical history of Acute diverticulitis (03/30/2020), Acute ST elevation myocardial infarction (STEMI) involving other coronary artery of inferior wall (HCC) (09/23/2021), Acute ST elevation myocardial infarction (STEMI) of inferior wall  (HCC) (09/22/2021), Asthma, Benign essential HTN, Claudication in peripheral vascular disease, Coronary artery disease, Hyperlipidemia, IBS (irritable bowel syndrome), and Migraine headache. here with ***     No orders of the defined types were placed in this encounter.  No orders of the defined types were placed in this encounter.    Duwaine Russell, MSN, NP-C 11/22/2024, 9:18 AM [facility] [address], [address] [place], [place] [address] [phone]      [1]  Allergies Allergen Reactions   Cefepime  Anaphylaxis, Shortness Of Breath, Swelling and Dermatitis   Edoxaban Other (See Comments)    Causes hematomas   Penicillins Anaphylaxis   Barium Iodid Rash   Other     Preservatives  (C-Diff)    Acetaminophen      Will not take after Cyanide deaths, something changed when they mkt'd again.  Can take Childrens Liquid but sometimes upset stomach too   Albuterol  Hypertension    ABLE TO TOLERATE IN INHALER FORM NOT NEBULIZER    Bactrim [Sulfamethoxazole-Trimethoprim]     REACTION: antibiotic induced colitis   Beta Adrenergic Blockers  Paradoxical reactions   Prednisone       ibs problems   Propofol      REACTION: FEVER   Repatha [Evolocumab] Other (See Comments)    Knocked her out for 10 days   "
